# Patient Record
Sex: Male | Born: 1935 | Race: White | Hispanic: No | Marital: Married | Smoking: Never smoker
Health system: Southern US, Community
[De-identification: ages and names within clinical notes are randomized; demographics above are authoritative.]

## PROBLEM LIST (undated history)

## (undated) DIAGNOSIS — I480 Paroxysmal atrial fibrillation: Secondary | ICD-10-CM

## (undated) DIAGNOSIS — G4700 Insomnia, unspecified: Secondary | ICD-10-CM

## (undated) DIAGNOSIS — M542 Cervicalgia: Secondary | ICD-10-CM

## (undated) DIAGNOSIS — C61 Malignant neoplasm of prostate: Secondary | ICD-10-CM

## (undated) DIAGNOSIS — E559 Vitamin D deficiency, unspecified: Secondary | ICD-10-CM

## (undated) DIAGNOSIS — M545 Low back pain, unspecified: Secondary | ICD-10-CM

## (undated) DIAGNOSIS — N529 Male erectile dysfunction, unspecified: Secondary | ICD-10-CM

## (undated) DIAGNOSIS — C44711 Basal cell carcinoma of skin of unspecified lower limb, including hip: Secondary | ICD-10-CM

## (undated) DIAGNOSIS — I255 Ischemic cardiomyopathy: Secondary | ICD-10-CM

## (undated) DIAGNOSIS — I10 Essential (primary) hypertension: Secondary | ICD-10-CM

## (undated) DIAGNOSIS — I5022 Chronic systolic (congestive) heart failure: Secondary | ICD-10-CM

## (undated) DIAGNOSIS — C44721 Squamous cell carcinoma of skin of unspecified lower limb, including hip: Secondary | ICD-10-CM

## (undated) DIAGNOSIS — R413 Other amnesia: Secondary | ICD-10-CM

## (undated) DIAGNOSIS — E785 Hyperlipidemia, unspecified: Secondary | ICD-10-CM

## (undated) DIAGNOSIS — S12100A Unspecified displaced fracture of second cervical vertebra, initial encounter for closed fracture: Secondary | ICD-10-CM

## (undated) DIAGNOSIS — I2510 Atherosclerotic heart disease of native coronary artery without angina pectoris: Secondary | ICD-10-CM

## (undated) DIAGNOSIS — G459 Transient cerebral ischemic attack, unspecified: Secondary | ICD-10-CM

## (undated) DIAGNOSIS — I442 Atrioventricular block, complete: Secondary | ICD-10-CM

## (undated) HISTORY — DX: Cervicalgia: M54.2

## (undated) HISTORY — DX: Essential (primary) hypertension: I10

## (undated) HISTORY — PX: CARDIAC DEFIBRILLATOR PLACEMENT: SHX171

## (undated) HISTORY — DX: Malignant neoplasm of prostate: C61

## (undated) HISTORY — DX: Squamous cell carcinoma of skin of unspecified lower limb, including hip: C44.721

## (undated) HISTORY — DX: Male erectile dysfunction, unspecified: N52.9

## (undated) HISTORY — DX: Ischemic cardiomyopathy: I25.5

## (undated) HISTORY — DX: Paroxysmal atrial fibrillation: I48.0

## (undated) HISTORY — PX: CORONARY ARTERY BYPASS GRAFT: SHX141

## (undated) HISTORY — DX: Unspecified displaced fracture of second cervical vertebra, initial encounter for closed fracture: S12.100A

## (undated) HISTORY — PX: CORONARY ANGIOPLASTY: SHX604

## (undated) HISTORY — DX: Vitamin D deficiency, unspecified: E55.9

## (undated) HISTORY — DX: Low back pain: M54.5

## (undated) HISTORY — DX: Low back pain, unspecified: M54.50

## (undated) HISTORY — DX: Other amnesia: R41.3

## (undated) HISTORY — PX: KYPHOSIS SURGERY: SHX114

## (undated) HISTORY — PX: HERNIA REPAIR: SHX51

## (undated) HISTORY — DX: Transient cerebral ischemic attack, unspecified: G45.9

## (undated) HISTORY — DX: Hyperlipidemia, unspecified: E78.5

## (undated) HISTORY — DX: Basal cell carcinoma of skin of unspecified lower limb, including hip: C44.711

## (undated) HISTORY — DX: Atrioventricular block, complete: I44.2

## (undated) HISTORY — PX: BACK SURGERY: SHX140

## (undated) HISTORY — DX: Chronic systolic (congestive) heart failure: I50.22

## (undated) HISTORY — DX: Insomnia, unspecified: G47.00

---

## 2004-09-02 HISTORY — PX: COLONOSCOPY: SHX174

## 2007-09-03 LAB — HM COLONOSCOPY

## 2008-12-01 HISTORY — PX: OTHER SURGICAL HISTORY: SHX169

## 2008-12-01 LAB — HM DEXA SCAN

## 2009-05-10 ENCOUNTER — Encounter: Payer: Self-pay | Admitting: Internal Medicine

## 2009-05-25 ENCOUNTER — Ambulatory Visit: Payer: Self-pay | Admitting: Internal Medicine

## 2009-05-25 DIAGNOSIS — I442 Atrioventricular block, complete: Secondary | ICD-10-CM | POA: Insufficient documentation

## 2009-05-25 DIAGNOSIS — I255 Ischemic cardiomyopathy: Secondary | ICD-10-CM | POA: Insufficient documentation

## 2009-05-25 DIAGNOSIS — Z95810 Presence of automatic (implantable) cardiac defibrillator: Secondary | ICD-10-CM | POA: Insufficient documentation

## 2009-05-25 DIAGNOSIS — I5022 Chronic systolic (congestive) heart failure: Secondary | ICD-10-CM | POA: Insufficient documentation

## 2009-06-05 ENCOUNTER — Encounter: Payer: Self-pay | Admitting: Internal Medicine

## 2009-06-28 ENCOUNTER — Encounter: Payer: Self-pay | Admitting: Internal Medicine

## 2009-07-24 ENCOUNTER — Ambulatory Visit: Payer: Self-pay | Admitting: Internal Medicine

## 2009-07-25 ENCOUNTER — Telehealth: Payer: Self-pay | Admitting: Internal Medicine

## 2009-10-09 ENCOUNTER — Encounter: Payer: Self-pay | Admitting: Internal Medicine

## 2009-10-09 DIAGNOSIS — R159 Full incontinence of feces: Secondary | ICD-10-CM | POA: Insufficient documentation

## 2009-10-11 ENCOUNTER — Encounter: Payer: Self-pay | Admitting: Internal Medicine

## 2009-11-13 ENCOUNTER — Encounter: Payer: Self-pay | Admitting: Internal Medicine

## 2009-11-14 ENCOUNTER — Ambulatory Visit: Payer: Self-pay | Admitting: Internal Medicine

## 2009-11-23 ENCOUNTER — Telehealth: Payer: Self-pay | Admitting: Internal Medicine

## 2009-11-24 ENCOUNTER — Telehealth: Payer: Self-pay | Admitting: Internal Medicine

## 2010-01-10 ENCOUNTER — Ambulatory Visit: Payer: Self-pay | Admitting: Internal Medicine

## 2010-01-12 ENCOUNTER — Ambulatory Visit: Payer: Self-pay | Admitting: Internal Medicine

## 2010-01-12 ENCOUNTER — Inpatient Hospital Stay (HOSPITAL_COMMUNITY): Admission: EM | Admit: 2010-01-12 | Discharge: 2010-01-15 | Payer: Self-pay | Admitting: Emergency Medicine

## 2010-01-15 ENCOUNTER — Encounter (INDEPENDENT_AMBULATORY_CARE_PROVIDER_SITE_OTHER): Payer: Self-pay | Admitting: Internal Medicine

## 2010-01-16 ENCOUNTER — Telehealth: Payer: Self-pay | Admitting: Internal Medicine

## 2010-02-02 ENCOUNTER — Telehealth: Payer: Self-pay | Admitting: Internal Medicine

## 2010-03-08 ENCOUNTER — Encounter: Payer: Self-pay | Admitting: Internal Medicine

## 2010-03-08 ENCOUNTER — Ambulatory Visit: Payer: Self-pay

## 2010-04-09 ENCOUNTER — Encounter: Payer: Self-pay | Admitting: Internal Medicine

## 2010-05-14 ENCOUNTER — Ambulatory Visit: Payer: Self-pay

## 2010-05-14 ENCOUNTER — Encounter: Payer: Self-pay | Admitting: Internal Medicine

## 2010-07-04 ENCOUNTER — Encounter: Payer: Self-pay | Admitting: Internal Medicine

## 2010-07-04 ENCOUNTER — Ambulatory Visit: Payer: Self-pay

## 2010-09-05 ENCOUNTER — Ambulatory Visit: Admission: RE | Admit: 2010-09-05 | Discharge: 2010-09-05 | Payer: Self-pay | Source: Home / Self Care

## 2010-10-02 ENCOUNTER — Encounter: Payer: Self-pay | Admitting: Internal Medicine

## 2010-10-02 NOTE — Letter (Signed)
Summary: [facility] Progress Note  [facility] Progress Note   Imported By: Roderic Ovens 02/12/2010 12:54:49  _____________________________________________________________________  External Attachment:    Type:   Image     Comment:   External Document

## 2010-10-02 NOTE — Letter (Signed)
Summary: [facility] Care Office Note  [facility] Office [facility]   Imported By: Roderic Ovens 11/22/2009 15:16:22  _____________________________________________________________________  External Attachment:    Type:   Image     Comment:   External Document

## 2010-10-02 NOTE — Assessment & Plan Note (Signed)
Summary: [facility]   Visit Type:  Follow-up Primary Provider:   [name], MD   History of Present Illness: [name] is returns  today for followup of ICM, CHF, VT, and CHB.   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  He was well from a cardiac perspective until 2007 when he had syncope and subsequent evaluation revealed CAD and he underwent CABG.  Several months later he had recurrent syncope and underwent BiV ICD implant as he had LBBB.  He has subsequently developed CHB and his CHF class is 1-2.  He denies c/p, sob, or peripheral edema. He is now approaching ERI on his device.  It is unclear that he has clear cut sustained VT as he has been shocked only once and it sounds like this was due to atrial fib.  Current Medications (verified): 1)  Fosamax 70 Mg Tabs (Alendronate Sodium) .... Once A Week 2)  Lisinopril 20 Mg Tabs (Lisinopril) .... Take 1 Tablet By Mouth Two Times A Day 3)  Carvedilol 25 Mg Tabs (Carvedilol) .... Take One Tablet By Mouth Twice A Day 4)  Dyazide 37.5-25 Mg Caps (Triamterene-Hctz) .... Take 1 Capsule By Mouth Once A Day 5)  Simvastatin 20 Mg Tabs (Simvastatin) .... Take One Tablet By Mouth Daily At Bedtime 6)  Calcium Carbonate-Vitamin D 600-400 Mg-Unit  Tabs (Calcium Carbonate-Vitamin D) .... Take 1 Tablet By Mouth Two Times A Day 7)  Aspirin 81 Mg Tbec (Aspirin) .... Take One Tablet By Mouth Daily 8)  Lorazepam 1 Mg Tabs (Lorazepam) .... 1/ Tab As Needed 9)  Vitamin D 1000 Unit Tabs (Cholecalciferol) .... Take One Tablet By Mouth Once Daily.  Allergies: 1)  ! Norvasc 2)  ! Coumadin 3)  ! * Blood Pressure Patch  Past History:  Past Medical History: Last updated: 11/10/2009 Current Problems:  AV BLOCK, COMPLETE (ICD-426.0) CHRONIC SYSTOLIC HEART FAILURE (ICD-428.22) CARDIOMYOPATHY, ISCHEMIC (ICD-414.8) AUTOMATIC IMPLANTABLE CARDIAC DEFIBRILLATOR SITU (ICD-V45.02)    Review of Systems  The patient denies chest pain, syncope, dyspnea on exertion, and peripheral edema.    Vital Signs:  Patient profile:   77 year old male Height:      70 inches Weight:      155 pounds BMI:     22.32 Pulse rate:   79 / minute BP sitting:   140 / 80  (left arm)  Vitals Entered By: Laurance Flatten CMA (November 14, 2009 1:51 PM)  Physical Exam  General:  Well developed, well nourished, in no acute distress. Head:  normocephalic and atraumatic Eyes:  PERRLA/EOM intact; conjunctiva and lids normal. Mouth:  Teeth, gums and palate normal. Oral mucosa normal. Neck:  Neck supple, no JVD. No masses, thyromegaly or abnormal cervical nodes. Chest Wall:  Well healed ICD incision. Lungs:  Clear bilaterally with no wheezes, rales, or rhonchi.  No increased work of breathing. Heart:  RRR with normal S1 and split S2.  PMI is enlarged and laterally displaced. Abdomen:  Bowel sounds positive; abdomen soft and non-tender without masses, organomegaly, or hernias noted. No hepatosplenomegaly. Msk:  Back normal, normal gait. Muscle strength and tone normal. Pulses:  pulses normal in all 4 extremities Extremities:  No clubbing or cyanosis. Neurologic:  Alert and oriented x 3.    ICD Specifications Following MD:  Lewayne Bunting, MD     ICD Vendor:  [facility] [employer]     ICD Model Number:  V366     ICD Serial Number:  [id number] ICD DOI:  03/24/2006     ICD Implanting MD:  NOT IMPLANTED HERE  Lead 1:    Location: RA     DOI: 03/24/2006     Model #: 1888TC     Serial #: [id number]     Status: active Lead 2:    Location: RV     DOI: 05/19/2006     Model #: 6433     Serial #: [id number]     Status: active Lead 3:    Location: LV     DOI: 03/24/2006     Model #: 1058T     Serial #: [id number]     Status: active  Indications::  ICM, CHF   ICD Follow Up Remote Check?  No Battery Voltage:  2.52 V     Charge Time:  13.1 seconds     Battery Est. Longevity:  2 months Underlying rhythm:  dependent ICD Dependent:   Yes       ICD Device Measurements Atrium:  Amplitude: 1.2 mV, Impedance: 385 ohms, Threshold: 0.75 V at 0.4 msec Right Ventricle:  Impedance: 285 ohms, Threshold: 0.75 V at 0.4 msec Left Ventricle:  Impedance: 610 ohms, Threshold: 1.0 V at 0.8 msec  Episodes MS Episodes:  1     Percent Mode Switch:  <1%     Coumadin:  No Shock:  0     ATP:  0     Nonsustained:  0     Atrial Pacing:  94%     Ventricular Pacing:  99%  Brady Parameters Mode DDDR     Lower Rate Limit:  70     Upper Rate Limit 120 PAV 180     Sensed AV Delay:  110  Tachy Zones VF:  200     VT:  179     Tech Comments:  No parameter changes.  ROV 2 months with Dr. Ladona Ridgel. Altha Harm, LPN  November 14, 2009 2:33 PM  MD Comments:  He is approaching ERI.  Impression & Recommendations:  Problem # 1:  AUTOMATIC IMPLANTABLE CARDIAC DEFIBRILLATOR SITU (ICD-V45.02) His device is approaching ERI.  I have offered him either BiV ICD or BiV PM.  He is considering his options.  Problem # 2:  CHRONIC SYSTOLIC HEART FAILURE (ICD-428.22) He appears to be class 2.  Continue current meds and maintain a low sodium diet. His updated medication list for this problem includes:    Lisinopril 20 Mg Tabs (Lisinopril) .Marland Kitchen... Take 1 tablet by mouth two times a day    Carvedilol 25 Mg Tabs (Carvedilol) .Marland Kitchen... Take one tablet by mouth twice a day    Dyazide 37.5-25 Mg Caps (Triamterene-hctz) .Marland Kitchen... Take 1 capsule by mouth once a day    Aspirin 81 Mg Tbec (Aspirin) .Marland Kitchen... Take one tablet by mouth daily  Problem # 3:  CARDIOMYOPATHY, ISCHEMIC (ICD-414.8) He denies any anginal symptoms.  Continue meds as below. His updated medication list for this problem includes:    Lisinopril 20 Mg Tabs (Lisinopril) .Marland Kitchen... Take 1 tablet by mouth two times a day    Carvedilol 25 Mg Tabs (Carvedilol) .Marland Kitchen... Take one tablet by mouth twice a day    Dyazide 37.5-25 Mg Caps (Triamterene-hctz) .Marland Kitchen... Take 1 capsule by mouth once a day    Aspirin 81 Mg Tbec (Aspirin) .Marland Kitchen... Take  one tablet by mouth daily Prescriptions: SIMVASTATIN 20 MG TABS (SIMVASTATIN) Take one tablet by mouth daily at bedtime  #  90 x 3   Entered by:   Layne Benton, RN, BSN   Authorized by:   Laren Boom, MD, [facility]   Signed by:   Layne Benton, RN, BSN on 11/14/2009   Method used:   Faxed to ...       [facility] Environmental education officer)       [address] [address]       [place], [place]  [address]       Ph: [phone]       Fax: [phone]   RxID:   [id number] DYAZIDE 37.5-25 MG CAPS (TRIAMTERENE-HCTZ) Take 1 capsule by mouth once a day  #90 x 3   Entered by:   Layne Benton, RN, BSN   Authorized by:   Laren Boom, MD, [facility]   Signed by:   Layne Benton, RN, BSN on 11/14/2009   Method used:   Faxed to ...       [facility] Environmental education officer)       [address] [address]       [place], [place]  [address]       Ph: [phone]       Fax: [phone]   RxID:   [id number] CARVEDILOL 25 MG TABS (CARVEDILOL) Take one tablet by mouth twice a day  #180 x 3   Entered by:   Layne Benton, RN, BSN   Authorized by:   Laren Boom, MD, [facility]   Signed by:   Layne Benton, RN, BSN on 11/14/2009   Method used:   Faxed to ...       [facility] Environmental education officer)       [address] [address]       [place], [place]  [address]       Ph: [phone]       Fax: [phone]   RxID:   [id number]

## 2010-10-02 NOTE — Cardiovascular Report (Signed)
Summary: Office Visit   Office Visit   Imported By: Roderic Ovens 05/29/2010 15:21:28  _____________________________________________________________________  External Attachment:    Type:   Image     Comment:   External Document

## 2010-10-02 NOTE — Procedures (Signed)
Summary: device check   Current Medications (verified): 1)  Lisinopril 20 Mg Tabs (Lisinopril) .... One By Mouth Daily 2)  Carvedilol 25 Mg Tabs (Carvedilol) .... Take One Tablet By Mouth Twice A Day 3)  Maxzide-25 37.5-25 Mg Tabs (Triamterene-Hctz) .Marland Kitchen.. 1 Tablet By Mouth Once Daily . 4)  Simvastatin 20 Mg Tabs (Simvastatin) .... Take One Tablet By Mouth Daily At Bedtime 5)  Calcium Carbonate-Vitamin D 600-400 Mg-Unit  Tabs (Calcium Carbonate-Vitamin D) .... Take 1 Tablet By Mouth Two Times A Day 6)  Lorazepam 1 Mg Tabs (Lorazepam) .... 1/ Tab As Needed 7)  Vitamin D 1000 Unit Tabs (Cholecalciferol) .... Take One Tablet By Mouth Once Daily. 8)  Aggrenox 25-200 Mg Xr12h-Cap (Aspirin-Dipyridamole) .... One By Mouth Two Times A Day  Allergies (verified): 1)  ! Norvasc 2)  ! Coumadin 3)  ! * Blood Pressure Patch   ICD Specifications Following MD:  Lewayne Bunting, MD     ICD Vendor:  [employer]     ICD Model Number:  [phone]     ICD Serial Number:  [id number] ICD DOI:  03/24/2006     ICD Implanting MD:  NOT IMPLANTED HERE  Lead 1:    Location: RA     DOI: 03/24/2006     Model #: 1888TC     Serial #: [id number]     Status: active Lead 2:    Location: RV     DOI: 05/19/2006     Model #: 7829     Serial #: [id number]     Status: active Lead 3:    Location: LV     DOI: 03/24/2006     Model #: 1058T     Serial #: [id number]     Status: active  Indications::  ICM, CHF   ICD Follow Up Battery Voltage:  2.52 V     Charge Time:  13.1 seconds     Battery Est. Longevity:  2-3 mths Underlying rhythm:  SR ICD Dependent:  Yes       ICD Device Measurements Atrium:  Amplitude: 1.0 mV, Impedance: 370 ohms, Threshold: 0.75 V at 0.4 msec Right Ventricle:  Amplitude: 9.0 mV, Impedance: 270 ohms, Threshold: 0.75 V at 0.4 msec Left Ventricle:  Impedance: 590 ohms, Threshold: 1.0 V at 0.8 msec Shock Impedance: 38 ohms   Episodes MS Episodes:  10     Percent Mode Switch:  <1%     Coumadin:  No Shock:  0     ATP:  0      Nonsustained:  0     Atrial Therapies:  0 Atrial Pacing:  96%     Ventricular Pacing:  98%  Brady Parameters Mode DDDR     Lower Rate Limit:  70     Upper Rate Limit 120 PAV 180     Sensed AV Delay:  110  Tachy Zones VF:  200     VT:  179     Next Cardiology Appt Due:  07/03/2010 Tech Comments:  BATTERY VOLTAGE 2.52--PER JOEY AT [facility] 2-3 MTHS W/100% PACING.  NORMAL DEVICE FUNCTION.  CHANGED RV OUTPUT FROM 2.0 TO 2.5 V. DEMONSTRATED VIBRATION FEELING FOR PT AND PT AWARE TO CALL IF HE FEELS IT.   ROV IN 2 MTHS W/DEVICE CLINIC. Vella Kohler  May 15, 2010 9:37 AM

## 2010-10-02 NOTE — Cardiovascular Report (Signed)
Summary: Office Visit   Office Visit   Imported By: Roderic Ovens 01/23/2010 16:06:14  _____________________________________________________________________  External Attachment:    Type:   Image     Comment:   External Document

## 2010-10-02 NOTE — Progress Notes (Signed)
Summary: b/p reading today 114/80  Phone Note Call from Patient Call back at Home Phone [phone]   Caller: Patient Reason for Call: Talk to Nurse Summary of Call: pt in ed on friday b/p 160/120 taken at [facility]. spend 3 night at hospital . this am 114/80. no cp, sob.  Initial call taken by: Lorne Skeens,  Jan 16, 2010 11:22 AM  Follow-up for Phone Call        that BP is fine.  BP 114/80.  Will go to doctors on Jhonnie Garner at [facility] or to call Dr Genevie Ann who is his primary care to follow up with his BP and the change in medications.  Pt understands Dennis Bast, RN, BSN  Jan 16, 2010 1:18 PM

## 2010-10-02 NOTE — Progress Notes (Signed)
Summary: refill meds  Phone Note Refill Request Call back at Home Phone [phone] Message from:  Patient on November 23, 2009 11:06 AM  Refills Requested: Medication #1:  DYAZIDE 37.5-25 MG CAPS Take 1 capsule by mouth once a day [facility] [phone] member id # [phone]   Method Requested: Fax to Mail Away Pharmacy Initial call taken by: Lorne Skeens,  November 23, 2009 11:06 AM    New/Updated Medications: DYAZIDE 37.5-25 MG CAPS (TRIAMTERENE-HCTZ) Take 1 capsule by mouth once a day Prescriptions: DYAZIDE 37.5-25 MG CAPS (TRIAMTERENE-HCTZ) Take 1 capsule by mouth once a day  #90 x 3   Entered by:   Laurance Flatten CMA   Authorized by:   Laren Boom, MD, [facility]   Signed by:   Laurance Flatten CMA on 11/23/2009   Method used:   Faxed to ...       [facility] Environmental education officer)       [address] [address]       [place], [place]  [address]       Ph: [phone]       Fax: [phone]   RxID:   [id number]

## 2010-10-02 NOTE — Cardiovascular Report (Signed)
Summary: Office Visit   Office Visit   Imported By: Roderic Ovens 03/13/2010 10:03:31  _____________________________________________________________________  External Attachment:    Type:   Image     Comment:   External Document

## 2010-10-02 NOTE — Letter (Signed)
Summary: [facility] Progress Note   [facility] Progress Note   Imported By: Roderic Ovens 05/22/2010 15:43:07  _____________________________________________________________________  External Attachment:    Type:   Image     Comment:   External Document

## 2010-10-02 NOTE — Letter (Signed)
Summary: Medical laboratory scientific officer Comprehensive Medical Exam  [facility] Comprehensive Medical Exam   Imported By: Roderic Ovens 02/12/2010 12:55:48  _____________________________________________________________________  External Attachment:    Type:   Image     Comment:   External Document

## 2010-10-02 NOTE — Assessment & Plan Note (Signed)
Summary: pc2   Visit Type:  Follow-up Primary Provider:   [name], MD   History of Present Illness: Jacob Rosales is returns  today for followup of ICM, CHF, VT, and CHB.   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  He was well from a cardiac perspective until 2007 when he had syncope and subsequent evaluation revealed CAD and he underwent CABG.  Several months later he had recurrent syncope and underwent BiV ICD implant as he had LBBB.  He has subsequently developed CHB and his CHF class is 1-2.  He denies c/p, sob, or peripheral edema. He is now approaching ERI on his device. Since I saw him last he has been stable with no intercurrent ICD shocks.  No other complaints today.  Current Medications (verified): 1)  Fosamax 70 Mg Tabs (Alendronate Sodium) .... Once A Week 2)  Lisinopril 20 Mg Tabs (Lisinopril) .... Take 1 Tablet By Mouth Two Times A Day 3)  Carvedilol 25 Mg Tabs (Carvedilol) .... Take One Tablet By Mouth Twice A Day 4)  Maxzide-25 37.5-25 Mg Tabs (Triamterene-Hctz) .Marland Kitchen.. 1 Tablet By Mouth Once Daily . 5)  Simvastatin 20 Mg Tabs (Simvastatin) .... Take One Tablet By Mouth Daily At Bedtime 6)  Calcium Carbonate-Vitamin D 600-400 Mg-Unit  Tabs (Calcium Carbonate-Vitamin D) .... Take 1 Tablet By Mouth Two Times A Day 7)  Aspirin 81 Mg Tbec (Aspirin) .... Take One Tablet By Mouth Daily 8)  Lorazepam 1 Mg Tabs (Lorazepam) .... 1/ Tab As Needed 9)  Vitamin D 1000 Unit Tabs (Cholecalciferol) .... Take One Tablet By Mouth Once Daily.  Allergies (verified): 1)  ! Norvasc 2)  ! Coumadin 3)  ! * Blood Pressure Patch  Past History:  Past Medical History: Last updated: 11/10/2009 Current Problems:  AV BLOCK, COMPLETE (ICD-426.0) CHRONIC SYSTOLIC HEART FAILURE (ICD-428.22) CARDIOMYOPATHY, ISCHEMIC (ICD-414.8) AUTOMATIC IMPLANTABLE CARDIAC DEFIBRILLATOR SITU (ICD-V45.02)    Review of Systems  The  patient denies chest pain, syncope, dyspnea on exertion, and peripheral edema.    Vital Signs:  Patient profile:   77 year old male Height:      70 inches Weight:      153 pounds BMI:     22.03 Pulse rate:   85 / minute BP sitting:   118 / 82  (left arm)  Vitals Entered By: Laurance Flatten CMA (Jan 10, 2010 2:16 PM)  Physical Exam  General:  Well developed, well nourished, in no acute distress. Head:  normocephalic and atraumatic Eyes:  PERRLA/EOM intact; conjunctiva and lids normal. Mouth:  Teeth, gums and palate normal. Oral mucosa normal. Neck:  Neck supple, no JVD. No masses, thyromegaly or abnormal cervical nodes. Chest Wall:  Well healed ICD incision. Lungs:  Clear bilaterally with no wheezes, rales, or rhonchi.  No increased work of breathing. Heart:  RRR with normal S1 and split S2.  PMI is enlarged and laterally displaced. Abdomen:  Bowel sounds positive; abdomen soft and non-tender without masses, organomegaly, or hernias noted. No hepatosplenomegaly. Msk:  Back normal, normal gait. Muscle strength and tone normal. Pulses:  pulses normal in all 4 extremities Extremities:  No clubbing or cyanosis. Neurologic:  Alert and oriented x 3.    ICD Specifications Following MD:  Lewayne Bunting, MD     ICD Vendor:  [facility] [employer]     ICD Model Number:  [phone]     ICD Serial Number:  [id number] ICD DOI:  03/24/2006     ICD Implanting MD:  NOT IMPLANTED HERE  Lead 1:    Location: RA     DOI: 03/24/2006     Model #: 1888TC     Serial #: [id number]     Status: active Lead 2:    Location: RV     DOI: 05/19/2006     Model #: 4166     Serial #: [id number]     Status: active Lead 3:    Location: LV     DOI: 03/24/2006     Model #: 1058T     Serial #: [id number]     Status: active  Indications::  ICM, CHF   ICD Follow Up Remote Check?  No Battery Voltage:  2.52 V     ICD Dependent:  Yes      Episodes MS Episodes:  10     Percent Mode Switch:  <1%     Coumadin:  No Shock:  0     ATP:  0     Nonsustained:   0      Brady Parameters Mode DDDR     Lower Rate Limit:  70     Upper Rate Limit 120 PAV 180     Sensed AV Delay:  110  Tachy Zones VF:  200     VT:  179     Tech Comments:  Battery check only for Atlas reaching 2.55 05/25/2009.  ROV 2 months  Altha Harm, LPN  Jan 10, 2010 2:32 PM  MD Comments:  Agree with above.  Impression & Recommendations:  Problem # 1:  AUTOMATIC IMPLANTABLE CARDIAC DEFIBRILLATOR SITU (ICD-V45.02) His device is approaching ERI.  Will recheck in a couple of months.  Problem # 2:  CARDIOMYOPATHY, ISCHEMIC (ICD-414.8) He is free of anginal symptoms.  Will followup. Continue current meds. His updated medication list for this problem includes:    Lisinopril 20 Mg Tabs (Lisinopril) .Marland Kitchen... Take 1 tablet by mouth two times a day    Carvedilol 25 Mg Tabs (Carvedilol) .Marland Kitchen... Take one tablet by mouth twice a day    Maxzide-25 37.5-25 Mg Tabs (Triamterene-hctz) .Marland Kitchen... 1 tablet by mouth once daily .    Aspirin 81 Mg Tbec (Aspirin) .Marland Kitchen... Take one tablet by mouth daily  Problem # 3:  CHRONIC SYSTOLIC HEART FAILURE (ICD-428.22) His symptoms are well controlled class 2.  A low sodium diet and medical therapy are recommended. His updated medication list for this problem includes:    Lisinopril 20 Mg Tabs (Lisinopril) .Marland Kitchen... Take 1 tablet by mouth two times a day    Carvedilol 25 Mg Tabs (Carvedilol) .Marland Kitchen... Take one tablet by mouth twice a day    Maxzide-25 37.5-25 Mg Tabs (Triamterene-hctz) .Marland Kitchen... 1 tablet by mouth once daily .    Aspirin 81 Mg Tbec (Aspirin) .Marland Kitchen... Take one tablet by mouth daily  Patient Instructions: 1)  Your physician recommends that you schedule a follow-up appointment in: 2 months with device clinic

## 2010-10-02 NOTE — Procedures (Signed)
Summary: DEVICE   Current Medications (verified): 1)  Lisinopril 20 Mg Tabs (Lisinopril) .... One By Mouth Daily 2)  Carvedilol 25 Mg Tabs (Carvedilol) .... Take One Tablet By Mouth Twice A Day 3)  Maxzide-25 37.5-25 Mg Tabs (Triamterene-Hctz) .Marland Kitchen.. 1 Tablet By Mouth Once Daily . 4)  Simvastatin 20 Mg Tabs (Simvastatin) .... Take One Tablet By Mouth Daily At Bedtime 5)  Calcium Carbonate-Vitamin D 600-400 Mg-Unit  Tabs (Calcium Carbonate-Vitamin D) .... Take 1 Tablet By Mouth Two Times A Day 6)  Lorazepam 1 Mg Tabs (Lorazepam) .... 1/ Tab As Needed 7)  Vitamin D 1000 Unit Tabs (Cholecalciferol) .... Take One Tablet By Mouth Once Daily. 8)  Aggrenox 25-200 Mg Xr12h-Cap (Aspirin-Dipyridamole) .... One By Mouth Two Times A Day  Allergies (verified): 1)  ! Norvasc 2)  ! Coumadin 3)  ! * Blood Pressure Patch   ICD Specifications Following MD:  Lewayne Bunting, MD     ICD Vendor:  [employer]     ICD Model Number:  [phone]     ICD Serial Number:  [id number] ICD DOI:  03/24/2006     ICD Implanting MD:  NOT IMPLANTED HERE  Lead 1:    Location: RA     DOI: 03/24/2006     Model #: 1888TC     Serial #: [id number]     Status: active Lead 2:    Location: RV     DOI: 05/19/2006     Model #: 8469     Serial #: [id number]     Status: active Lead 3:    Location: LV     DOI: 03/24/2006     Model #: 1058T     Serial #: [id number]     Status: active  Indications::  ICM, CHF   ICD Follow Up Battery Voltage:  2.51 V     Charge Time:  13.5 seconds     ICD Dependent:  Yes      Episodes Coumadin:  No  Brady Parameters Mode DDDR     Lower Rate Limit:  70     Upper Rate Limit 120 PAV 180     Sensed AV Delay:  110  Tachy Zones VF:  200     VT:  179     Next Cardiology Appt Due:  09/03/2010 Tech Comments:  INTERROGATION ONLY---BATTERY CHECK-- BATTERY VOLTAGE 2.51 (LAST CHECK 2.52 V AND PER [facility] 2-3 MTHS W/100% PACING). ROV IN 2 MTHS FOR BATTERY CHECK. Vella Kohler  July 04, 2010 4:47 PM

## 2010-10-02 NOTE — Progress Notes (Signed)
Summary: dyazide on back order - subt for maxdide 37.5/25  Phone Note From Pharmacy   Refills Requested: Medication #1:  DYAZIDE 37.5-25 MG CAPS Take 1 capsule by mouth once a day Caller: [facility]-[phone] / ref # [id number] Request: Speak with Nurse Summary of Call: THIS MED IS ON BACK ORDER. can we get permission to dispense maxdide 37.5 / 25 tab.  Initial call taken by: Lorne Skeens,  November 24, 2009 10:22 AM    New/Updated Medications: MAXZIDE-25 37.5-25 MG TABS (TRIAMTERENE-HCTZ) 1 tablet by mouth once daily . Prescriptions: MAXZIDE-25 37.5-25 MG TABS (TRIAMTERENE-HCTZ) 1 tablet by mouth once daily .  #90 x 3   Entered by:   Dennis Bast, RN, BSN   Authorized by:   Laren Boom, MD, [facility]   Signed by:   Dennis Bast, RN, BSN on 11/24/2009   Method used:   Faxed to ...       [facility] Environmental education officer)       [address] [address]       [place], [place]  [address]       Ph: [phone]       Fax: [phone]   RxID:   [id number]

## 2010-10-02 NOTE — Procedures (Signed)
Summary: DEVICE CK   Current Medications (verified): 1)  Lisinopril 20 Mg Tabs (Lisinopril) .... One By Mouth Daily 2)  Carvedilol 25 Mg Tabs (Carvedilol) .... Take One Tablet By Mouth Twice A Day 3)  Maxzide-25 37.5-25 Mg Tabs (Triamterene-Hctz) .Marland Kitchen.. 1 Tablet By Mouth Once Daily . 4)  Simvastatin 20 Mg Tabs (Simvastatin) .... Take One Tablet By Mouth Daily At Bedtime 5)  Calcium Carbonate-Vitamin D 600-400 Mg-Unit  Tabs (Calcium Carbonate-Vitamin D) .... Take 1 Tablet By Mouth Two Times A Day 6)  Lorazepam 1 Mg Tabs (Lorazepam) .... 1/ Tab As Needed 7)  Vitamin D 1000 Unit Tabs (Cholecalciferol) .... Take One Tablet By Mouth Once Daily. 8)  Aggrenox 25-200 Mg Xr12h-Cap (Aspirin-Dipyridamole) .... One By Mouth Two Times A Day  Allergies (verified): 1)  ! Norvasc 2)  ! Coumadin 3)  ! * Blood Pressure Patch   ICD Specifications Following MD:  Lewayne Bunting, MD     ICD Vendor:  St Jude     ICD Model Number:  [phone]     ICD Serial Number:  [id number] ICD DOI:  03/24/2006     ICD Implanting MD:  NOT IMPLANTED HERE  Lead 1:    Location: RA     DOI: 03/24/2006     Model #: 1888TC     Serial #: [id number]     Status: active Lead 2:    Location: RV     DOI: 05/19/2006     Model #: 7829     Serial #: [id number]     Status: active Lead 3:    Location: LV     DOI: 03/24/2006     Model #: 1058T     Serial #: [id number]     Status: active  Indications::  ICM, CHF   ICD Follow Up Remote Check?  No Battery Voltage:  2.55 V     Charge Time:  13.1 seconds     Underlying rhythm:  dependent ICD Dependent:  Yes       ICD Device Measurements Atrium:  Impedance: 365 ohms,  Right Ventricle:  Impedance: 280 ohms,  Left Ventricle:  Impedance: 590 ohms,   Episodes MS Episodes:  0     Percent Mode Switch:  0     Coumadin:  No Shock:  0     ATP:  0     Nonsustained:  0     Atrial Pacing:  90%     Ventricular Pacing:  98%  Brady Parameters Mode DDDR     Lower Rate Limit:  70     Upper Rate Limit 120 PAV 180      Sensed AV Delay:  110  Tachy Zones VF:  200     VT:  179     Tech Comments:  No parameter changes.  Interrogation only for battery check of Atlas [id number].  2.55 since 05/25/09.  Mr. Valone is pacemaker dependent.  ROV 2 months in the clinic. Altha Harm, LPN  March 09, 9527 3:55 PM

## 2010-10-02 NOTE — Progress Notes (Signed)
Summary: f/u on blood pressure meds  Phone Note Call from Patient Call back at Home Phone [phone] Call back at or call back after 3 p.m.   Caller: Patient Reason for Call: Talk to Nurse Summary of Call:  f/u on blood pressure meds that was changed.  Initial call taken by: Lorne Skeens,  February 02, 2010 11:16 AM  Follow-up for Phone Call        Met with NP and the change in med are as follows d/c Lisinopril 20mg pm dose only d/c'd the am dose and his BP was 109/65 pm 133/78. Dennis Bast, RN, BSN  February 02, 2010 11:28 AM

## 2010-10-02 NOTE — Progress Notes (Signed)
Summary: Patients At Home Vitals  Patients At Home Vitals   Imported By: Roderic Ovens 12/05/2009 16:19:31  _____________________________________________________________________  External Attachment:    Type:   Image     Comment:   External Document

## 2010-10-04 NOTE — Cardiovascular Report (Signed)
Summary: Office Visit   Office Visit   Imported By: Roderic Ovens 08/16/2010 11:58:19  _____________________________________________________________________  External Attachment:    Type:   Image     Comment:   External Document

## 2010-10-04 NOTE — Procedures (Signed)
Summary: DEVICE CHECK   Current Medications (verified): 1)  Lisinopril 20 Mg Tabs (Lisinopril) .... One By Mouth Daily 2)  Carvedilol 25 Mg Tabs (Carvedilol) .... Take One Tablet By Mouth Twice A Day 3)  Maxzide-25 37.5-25 Mg Tabs (Triamterene-Hctz) .Marland Kitchen.. 1 Tablet By Mouth Once Daily . 4)  Simvastatin 20 Mg Tabs (Simvastatin) .... Take One Tablet By Mouth Daily At Bedtime 5)  Calcium Carbonate-Vitamin D 600-400 Mg-Unit  Tabs (Calcium Carbonate-Vitamin D) .... Take 1 Tablet By Mouth Two Times A Day 6)  Lorazepam 1 Mg Tabs (Lorazepam) .... 1/ Tab As Needed 7)  Vitamin D 1000 Unit Tabs (Cholecalciferol) .... Take One Tablet By Mouth Once Daily. 8)  Aggrenox 25-200 Mg Xr12h-Cap (Aspirin-Dipyridamole) .... One By Mouth Two Times A Day  Allergies (verified): 1)  ! Norvasc 2)  ! Coumadin 3)  ! * Blood Pressure Patch   ICD Specifications Following MD:  Lewayne Bunting, MD     ICD Vendor:  St Jude     ICD Model Number:  [phone]     ICD Serial Number:  [id number] ICD DOI:  03/24/2006     ICD Implanting MD:  NOT IMPLANTED HERE  Lead 1:    Location: RA     DOI: 03/24/2006     Model #: 1888TC     Serial #: [id number]     Status: active Lead 2:    Location: RV     DOI: 05/19/2006     Model #: 6213     Serial #: [id number]     Status: active Lead 3:    Location: LV     DOI: 03/24/2006     Model #: 1058T     Serial #: [id number]     Status: active  Indications::  ICM, CHF   ICD Follow Up Battery Voltage:  2.47 V     Charge Time:  13.8 seconds     ICD Dependent:  Yes       ICD Device Measurements Atrium:  Impedance: 360 ohms,  Right Ventricle:  Impedance: 270 ohms,  Left Ventricle:  Impedance: 600 ohms,  Shock Impedance: 23 ohms   Episodes Coumadin:  No  Brady Parameters Mode DDDR     Lower Rate Limit:  70     Upper Rate Limit 120 PAV 180     Sensed AV Delay:  110  Tachy Zones VF:  200     VT:  179     Next Cardiology Appt Due:  10/03/2010 Tech Comments:  INTERROGATION ONLY---BATTERY VOLTAGE 2.47 V.   PER CHRISTOPHER AT [facility] LESS THAN 3 MTHS.  ROV IN 1 MTH.  PT AWARE OF VIBRATION NOTIFIER AND TO CALL IF FEELS IT. Vella Kohler  September 05, 2010 5:10 PM

## 2010-10-08 ENCOUNTER — Encounter: Payer: Self-pay | Admitting: Internal Medicine

## 2010-10-09 ENCOUNTER — Encounter: Payer: Self-pay | Admitting: Internal Medicine

## 2010-10-09 ENCOUNTER — Encounter (INDEPENDENT_AMBULATORY_CARE_PROVIDER_SITE_OTHER): Payer: Medicare Other

## 2010-10-09 DIAGNOSIS — I2589 Other forms of chronic ischemic heart disease: Secondary | ICD-10-CM

## 2010-10-24 NOTE — Procedures (Signed)
Summary: device check/sl mca   Current Medications (verified): 1)  Lisinopril 20 Mg Tabs (Lisinopril) .... One By Mouth Daily 2)  Carvedilol 25 Mg Tabs (Carvedilol) .... Take One Tablet By Mouth Twice A Day 3)  Maxzide-25 37.5-25 Mg Tabs (Triamterene-Hctz) .Marland Kitchen.. 1 Tablet By Mouth Once Daily . 4)  Simvastatin 20 Mg Tabs (Simvastatin) .... Take One Tablet By Mouth Daily At Bedtime 5)  Calcium Carbonate-Vitamin D 600-400 Mg-Unit  Tabs (Calcium Carbonate-Vitamin D) .... Take 1 Tablet By Mouth Two Times A Day 6)  Lorazepam 1 Mg Tabs (Lorazepam) .... 1/ Tab As Needed 7)  Vitamin D 1000 Unit Tabs (Cholecalciferol) .... Take One Tablet By Mouth Once Daily. 8)  Aggrenox 25-200 Mg Xr12h-Cap (Aspirin-Dipyridamole) .... One By Mouth Two Times A Day  Allergies (verified): 1)  ! Norvasc 2)  ! Coumadin 3)  ! * Blood Pressure Patch   ICD Specifications Following MD:  Lewayne Bunting, MD     ICD Vendor:  St Jude     ICD Model Number:  [phone]     ICD Serial Number:  [id number] ICD DOI:  03/24/2006     ICD Implanting MD:  NOT IMPLANTED HERE  Lead 1:    Location: RA     DOI: 03/24/2006     Model #: 1888TC     Serial #: [id number]     Status: active Lead 2:    Location: RV     DOI: 05/19/2006     Model #: 2130     Serial #: [id number]     Status: active Lead 3:    Location: LV     DOI: 03/24/2006     Model #: 1058T     Serial #: [id number]     Status: active  Indications::  ICM, CHF   ICD Follow Up Battery Voltage:  2.47 V     Charge Time:  13.8 seconds     Underlying rhythm:  CHB ICD Dependent:  Yes       ICD Device Measurements Atrium:  Amplitude: 3.0 mV, Impedance: 365 ohms, Threshold: 0.75 V at 0.4 msec Right Ventricle:  Amplitude: PACED mV, Impedance: 270 ohms, Threshold: 0.75 V at 0.4 msec Left Ventricle:  Impedance: 600 ohms, Threshold: 1.0 V at 0.8 msec Shock Impedance: 23 ohms   Episodes MS Episodes:  0     Percent Mode Switch:  0     Coumadin:  No Shock:  0     ATP:  0     Nonsustained:  0     Atrial  Therapies:  0 Atrial Pacing:  91%     Ventricular Pacing:  >99%  Brady Parameters Mode DDDR     Lower Rate Limit:  70     Upper Rate Limit 120 PAV 180     Sensed AV Delay:  110  Tachy Zones VF:  200     VT:  179     Next Cardiology Appt Due:  11/08/2010 Tech Comments:  BATTERY VOLTAGE 2.47 V---ROV IN 1 MTH FOR DEVICE CHECK.  NORMAL DEVICE FUNCTION.  NO CHANGES MADE. ROV IN 1 MTH. Vella Kohler  October 10, 2010 8:39 AM

## 2010-10-30 ENCOUNTER — Encounter (INDEPENDENT_AMBULATORY_CARE_PROVIDER_SITE_OTHER): Payer: Self-pay | Admitting: *Deleted

## 2010-11-08 ENCOUNTER — Telehealth: Payer: Self-pay | Admitting: Internal Medicine

## 2010-11-08 NOTE — Letter (Signed)
Summary: Appointment - Reschedule  [facility], Main Office  [address]   [place], [place] [address]   Phone: [phone]  Fax: [phone]     October 30, 2010 MRN: [id number]   ALIOUNE HODGKIN [address] [place], [place]  [address]   Dear Mr. APACHITO,   Due to a change in our office schedule, your appointment on  11-07-10  at   3:30p             must be changed.  It is very important that we reach you to reschedule this appointment. We look forward to participating in your health care needs. Please contact us at the number listed above at your earliest convenience to reschedule this appointment.     Sincerely,  Glass blower/designer

## 2010-11-08 NOTE — Cardiovascular Report (Signed)
Summary: Office Visit   Office Visit   Imported By: Roderic Ovens 10/29/2010 15:53:42  _____________________________________________________________________  External Attachment:    Type:   Image     Comment:   External Document

## 2010-11-13 NOTE — Letter (Signed)
Summary: [facility]   Imported By: Marylou Mccoy 11/06/2010 15:48:19  _____________________________________________________________________  External Attachment:    Type:   Image     Comment:   External Document

## 2010-11-15 ENCOUNTER — Encounter: Payer: Self-pay | Admitting: Internal Medicine

## 2010-11-15 ENCOUNTER — Encounter: Payer: Self-pay | Admitting: Physician Assistant

## 2010-11-15 ENCOUNTER — Ambulatory Visit (INDEPENDENT_AMBULATORY_CARE_PROVIDER_SITE_OTHER): Payer: Medicare Other | Admitting: Physician Assistant

## 2010-11-15 DIAGNOSIS — E785 Hyperlipidemia, unspecified: Secondary | ICD-10-CM | POA: Insufficient documentation

## 2010-11-15 DIAGNOSIS — I5022 Chronic systolic (congestive) heart failure: Secondary | ICD-10-CM

## 2010-11-15 DIAGNOSIS — I2510 Atherosclerotic heart disease of native coronary artery without angina pectoris: Secondary | ICD-10-CM | POA: Insufficient documentation

## 2010-11-15 DIAGNOSIS — I2589 Other forms of chronic ischemic heart disease: Secondary | ICD-10-CM

## 2010-11-15 DIAGNOSIS — I428 Other cardiomyopathies: Secondary | ICD-10-CM

## 2010-11-15 DIAGNOSIS — I10 Essential (primary) hypertension: Secondary | ICD-10-CM | POA: Insufficient documentation

## 2010-11-16 ENCOUNTER — Encounter: Payer: Self-pay | Admitting: Internal Medicine

## 2010-11-16 ENCOUNTER — Other Ambulatory Visit: Payer: Self-pay | Admitting: Internal Medicine

## 2010-11-16 ENCOUNTER — Other Ambulatory Visit (INDEPENDENT_AMBULATORY_CARE_PROVIDER_SITE_OTHER): Payer: Medicare Other

## 2010-11-16 DIAGNOSIS — E785 Hyperlipidemia, unspecified: Secondary | ICD-10-CM

## 2010-11-16 DIAGNOSIS — I509 Heart failure, unspecified: Secondary | ICD-10-CM

## 2010-11-16 DIAGNOSIS — I2510 Atherosclerotic heart disease of native coronary artery without angina pectoris: Secondary | ICD-10-CM

## 2010-11-16 DIAGNOSIS — I10 Essential (primary) hypertension: Secondary | ICD-10-CM

## 2010-11-16 DIAGNOSIS — I5022 Chronic systolic (congestive) heart failure: Secondary | ICD-10-CM

## 2010-11-16 LAB — CBC WITH DIFFERENTIAL/PLATELET
HCT: 40.2 % (ref 39.0–52.0)
Hemoglobin: 13.7 g/dL (ref 13.0–17.0)
MCHC: 34.1 g/dL (ref 30.0–36.0)
MCV: 97.3 fl (ref 78.0–100.0)
RBC: 4.14 Mil/uL — ABNORMAL LOW (ref 4.22–5.81)
RDW: 14 % (ref 11.5–14.6)
WBC: 4.4 10*3/uL — ABNORMAL LOW (ref 4.5–10.5)

## 2010-11-16 LAB — BASIC METABOLIC PANEL
BUN: 30 mg/dL — ABNORMAL HIGH (ref 6–23)
Chloride: 101 mEq/L (ref 96–112)
Creatinine, Ser: 1.5 mg/dL (ref 0.4–1.5)
Potassium: 4.4 mEq/L (ref 3.5–5.1)
Sodium: 138 mEq/L (ref 135–145)

## 2010-11-16 LAB — PROTIME-INR: INR: 1 ratio (ref 0.8–1.0)

## 2010-11-19 ENCOUNTER — Telehealth: Payer: Self-pay | Admitting: Internal Medicine

## 2010-11-19 LAB — BASIC METABOLIC PANEL
BUN: 14 mg/dL (ref 6–23)
CO2: 27 mEq/L (ref 19–32)
Calcium: 9.3 mg/dL (ref 8.4–10.5)
Chloride: 102 mEq/L (ref 96–112)
Creatinine, Ser: 1.09 mg/dL (ref 0.4–1.5)
GFR calc Af Amer: >60 mL/min (ref >60)
GFR calc non Af Amer: >60 mL/min (ref >60)
Glucose, Bld: 92 mg/dL (ref 70–99)
Potassium: 3.5 mEq/L (ref 3.5–5.1)
Sodium: 138 mEq/L (ref 135–145)

## 2010-11-19 LAB — CBC
HCT: 40.2 % (ref 39.0–52.0)
Platelets: 106 10*3/uL — ABNORMAL LOW (ref 150–400)
WBC: 4.8 10*3/uL (ref 4.0–10.5)

## 2010-11-20 LAB — CK TOTAL AND CKMB (NOT AT ARMC): Relative Index: 3.3 — ABNORMAL HIGH (ref 0.0–2.5)

## 2010-11-20 LAB — CBC
HCT: 41.4 % (ref 39.0–52.0)
MCHC: 34.7 g/dL (ref 30.0–36.0)
MCV: 95.4 fL (ref 78.0–100.0)
Platelets: 125 10*3/uL — ABNORMAL LOW (ref 150–400)

## 2010-11-20 LAB — DIFFERENTIAL
Basophils Relative: 0 % (ref 0–1)
Lymphocytes Relative: 15 % (ref 12–46)
Lymphs Abs: 0.8 10*3/uL (ref 0.7–4.0)
Monocytes Absolute: 0.3 10*3/uL (ref 0.1–1.0)
Monocytes Relative: 5 % (ref 3–12)

## 2010-11-20 LAB — LIPID PANEL
HDL: 51 mg/dL (ref >39)
Total CHOL/HDL Ratio: 2.3 RATIO
Triglycerides: 35 mg/dL (ref <150)

## 2010-11-20 LAB — COMPREHENSIVE METABOLIC PANEL
ALT: 20 U/L (ref 0–53)
AST: 28 U/L (ref 0–37)
Albumin: 3.9 g/dL (ref 3.5–5.2)
BUN: 16 mg/dL (ref 6–23)
Calcium: 9.4 mg/dL (ref 8.4–10.5)
Chloride: 102 mEq/L (ref 96–112)
GFR calc non Af Amer: >60 mL/min (ref >60)
Glucose, Bld: 132 mg/dL — ABNORMAL HIGH (ref 70–99)
Potassium: 3.6 mEq/L (ref 3.5–5.1)
Sodium: 135 mEq/L (ref 135–145)
Total Bilirubin: 1.2 mg/dL (ref 0.3–1.2)

## 2010-11-20 LAB — TSH: TSH: 2.292 u[IU]/mL (ref 0.350–4.500)

## 2010-11-20 LAB — URINALYSIS, ROUTINE W REFLEX MICROSCOPIC
Glucose, UA: NEGATIVE mg/dL
Hgb urine dipstick: NEGATIVE
Nitrite: NEGATIVE
Specific Gravity, Urine: 1.012 (ref 1.005–1.030)
pH: 6 (ref 5.0–8.0)

## 2010-11-20 LAB — BASIC METABOLIC PANEL
BUN: 19 mg/dL (ref 6–23)
Calcium: 9.5 mg/dL (ref 8.4–10.5)
Glucose, Bld: 96 mg/dL (ref 70–99)
Sodium: 134 mEq/L — ABNORMAL LOW (ref 135–145)

## 2010-11-20 LAB — TROPONIN I: Troponin I: 0.03 ng/mL (ref 0.00–0.06)

## 2010-11-20 LAB — HEMOGLOBIN A1C
Hgb A1c MFr Bld: 5.7 % — ABNORMAL HIGH (ref <5.7)
Mean Plasma Glucose: 117 mg/dL — ABNORMAL HIGH (ref <117)

## 2010-11-20 LAB — PROTIME-INR: Prothrombin Time: 14 seconds (ref 11.6–15.2)

## 2010-11-20 NOTE — Assessment & Plan Note (Signed)
Summary: H&P   Primary Provider:   [name], MD   History of Present Illness: Primary Electrophysiologist:  Dr. Lewayne Bunting  Jacob Rosales is an 77 yo male with a h/o ICM, CHF, VT, and CHB.   He moved to [facility] from [place] with his wife to live closer to family a couple years ago.  He was dx with CAD in 2007 and he underwent CABG.  Several months later he had recurrent syncope and underwent BiV ICD implant as he had LBBB.  He has subsequently developed CHB and his CHF class is 1-2.  He has recently reached ERI for his device and comes in for evaluation prior to his generator change out.  He denies chest pain, dyspnea or syncope.  No ICD therapies.  No orthopnea or PND.  No edema.  He reports NYHA Class IIb.  Current Medications (verified): 1)  Lisinopril 20 Mg Tabs (Lisinopril) .... One By Mouth Daily 2)  Carvedilol 25 Mg Tabs (Carvedilol) .... Take One Tablet By Mouth Twice A Day 3)  Maxzide-25 37.5-25 Mg Tabs (Triamterene-Hctz) .Marland Kitchen.. 1 Tablet By Mouth Once Daily . 4)  Simvastatin 20 Mg Tabs (Simvastatin) .... Take One Tablet By Mouth Daily At Bedtime 5)  Calcium Carbonate-Vitamin D 600-400 Mg-Unit  Tabs (Calcium Carbonate-Vitamin D) .... Take 1 Tablet By Mouth Two Times A Day 6)  Lorazepam 1 Mg Tabs (Lorazepam) .Marland Kitchen.. 1 Tab As Needed 7)  Vitamin D 1000 Unit Tabs (Cholecalciferol) .... Take One Tablet By Mouth Once Daily. 8)  Aggrenox 25-200 Mg Xr12h-Cap (Aspirin-Dipyridamole) .... One By Mouth Two Times A Day  Allergies: 1)  ! Norvasc 2)  ! Coumadin 3)  ! * Blood Pressure Patch  Past History:  Past Medical History: Current Problems:  AV BLOCK, COMPLETE (ICD-426.0) CHRONIC SYSTOLIC HEART FAILURE (ICD-428.22) CARDIOMYOPATHY, ISCHEMIC (ICD-414.8) AUTOMATIC IMPLANTABLE CARDIAC DEFIBRILLATOR SITU (ICD-V45.02) CAD s/p CABG Prostate CA s/p radiation Rx. Osteoporosis s/p vertebral compression fx's Hypertension Dyslipidemia h/o TIAs  Past Surgical History: CABG Hernia  repair kyphoplasty s/p BiV-ICD  Family History: Reviewed history from 05/25/2009 and no changes required. Negative for premature CAD  Social History: Reviewed history from 05/25/2009 and no changes required. Married No tobacco No ETOH Retired Art gallery manager.  Review of Systems       As per  the HPI.  All other systems reviewed and negative.   Vital Signs:  Patient profile:   77 year old male Height:      70 inches Weight:      153 pounds BMI:     22.03 Pulse rate:   91 / minute Resp:     18 per minute BP sitting:   153 / 94  (left arm)  Vitals Entered By: Kem Parkinson (November 15, 2010 3:09 PM)  Physical Exam  General:  Well developed, well nourished, in no acute distress. Head:  normocephalic and atraumatic Eyes:  PERRLA/EOM intact; conjunctiva and lids normal. Mouth:  Mucus membranes moist. Neck:  Neck supple, no JVD. No masses, thyromegaly or abnormal cervical nodes. Chest Wall:  no deformities noted  Lungs:  Clear bilaterally to auscultation and percussion; no wheezing or rales. Heart:  Normal S1, S2; RRR; 1/6 SEM along LSB. Abdomen:  Bowel sounds positive; abdomen soft and non-tender without masses or organomegaly. Rectal:  Deferred. Msk:  No deformities. Pulses:  DP/PT 1+ bilat. Extremities:  No edema Neurologic:  Alert and oriented x 3; CNs 2-12 intact. Skin:  Warm and dry. Psych:  Normal affect.    ICD Specifications  Following MD:  Lewayne Bunting, MD     ICD Vendor:  St Jude     ICD Model Number:  [phone]     ICD Serial Number:  [id number] ICD DOI:  03/24/2006     ICD Implanting MD:  NOT IMPLANTED HERE  Lead 1:    Location: RA     DOI: 03/24/2006     Model #: 1888TC     Serial #: [id number]     Status: active Lead 2:    Location: RV     DOI: 05/19/2006     Model #: 2130     Serial #: [id number]     Status: active Lead 3:    Location: LV     DOI: 03/24/2006     Model #: 1058T     Serial #: [id number]     Status: active  Indications::  ICM, CHF   ICD Follow Up ICD  Dependent:  Yes      Episodes Coumadin:  No  Brady Parameters Mode DDDR     Lower Rate Limit:  70     Upper Rate Limit 120 PAV 180     Sensed AV Delay:  110  Tachy Zones VF:  200     VT:  179     Impression & Recommendations:  Problem # 1:  AUTOMATIC IMPLANTABLE CARDIAC DEFIBRILLATOR SITU (ICD-V45.02) He is at ERI.  Dr. Ladona Ridgel met with him today to discuss risks, benefits and alternatives of proceeding with generator change out.  All of his questions were answered today.  He is willing to proceed and his procedure will be arranged in the next 1-2 weeks.  Problem # 2:  CHRONIC SYSTOLIC HEART FAILURE (ICD-428.22) Volume is stable on current meds.  Problem # 3:  CAD (ICD-414.00) No anginal symptoms.  Problem # 4:  HYPERTENSION (ICD-401.9) BP somewhat elevated.  Continue to monitor.  BP managed by PCP.  Problem # 5:  DYSLIPIDEMIA (ICD-272.4) Managed by PCP.

## 2010-11-20 NOTE — Letter (Signed)
Summary: Implantable Device Instructions  [facility], Main Office  [address]   [place], [place] [address]   Phone: [phone]  Fax: [phone]      Implantable Device Instructions  You are scheduled for:  _____ Permanent Transvenous Pacemaker _____ Implantable Cardioverter Defibrillator _____ Implantable Loop Recorder ___X__ Generator Change  on ___3/21/12__ with Dr. ____TAYLOR_.  1.  Please arrive at the Short Stay Center at [facility] at _6:30 AM____ on the day of your procedure.  2.  Do not eat or drink the night before your procedure.  3.  Complete lab work on 11/16/10 10:30 AM_____.  The lab at [facility] is open from 8:30 AM to 1:30 PM and from 2:30 PM to 5:00 PM.  The lab at [facility] is open from 7:30 AM to 5:30 PM.  You do not have to be fasting.   4.  Plan for an overnight stay.  Bring your insurance cards and a list of your medications.  6.  Wash your chest and neck with antibacterial soap (any brand) the evening before and the morning of your procedure.  Rinse well.    *If you have ANY questions after you get home, please call the office [phone].  *Every attempt is made to prevent procedures from being rescheduled.  Due to the nauture of Electrophysiology, rescheduling can happen.  The physician is always aware and directs the staff when this occurs.    Appended Document: Implantable Device Instructions PT AWARETO HOLD MAXIDE AM OF PROCEDURE./CY

## 2010-11-20 NOTE — Progress Notes (Signed)
Summary: question re device  Phone Note Call from Patient Call back at Home Phone [phone]   Summary of Call: pt has question re replacing his device. pt wants to know shlould he keep his appt for his device. Initial call taken by: Roe Coombs,  November 08, 2010 3:12 PM  Follow-up for Phone Call        pt have question about device CALL BACK [phone] Judie Grieve  November 09, 2010 3:24 PM spoke with pt on 11/09/10 and cx his device clinic apt for 11/12/10 and sch him to see PA on 11/15/10 same day Dr Ladona Ridgel is in the office  Pt aware.  He needs a gen change per Christophe Louis in device clinic Dennis Bast, RN, BSN  November 12, 2010 3:15 PM

## 2010-11-21 ENCOUNTER — Encounter (HOSPITAL_COMMUNITY): Payer: Medicare Other

## 2010-11-21 ENCOUNTER — Ambulatory Visit (HOSPITAL_COMMUNITY)
Admission: RE | Admit: 2010-11-21 | Discharge: 2010-11-21 | Disposition: A | Discharge status: Home / Self Care | Payer: Medicare Other | Source: Ambulatory Visit | Attending: Internal Medicine | Admitting: Internal Medicine

## 2010-11-21 DIAGNOSIS — Z4502 Encounter for adjustment and management of automatic implantable cardiac defibrillator: Secondary | ICD-10-CM | POA: Insufficient documentation

## 2010-11-21 DIAGNOSIS — I509 Heart failure, unspecified: Secondary | ICD-10-CM | POA: Insufficient documentation

## 2010-11-21 DIAGNOSIS — I428 Other cardiomyopathies: Secondary | ICD-10-CM

## 2010-11-21 DIAGNOSIS — I442 Atrioventricular block, complete: Secondary | ICD-10-CM | POA: Insufficient documentation

## 2010-11-21 HISTORY — PX: OTHER SURGICAL HISTORY: SHX169

## 2010-11-23 ENCOUNTER — Telehealth: Payer: Self-pay | Admitting: Internal Medicine

## 2010-11-26 ENCOUNTER — Telehealth: Payer: Self-pay | Admitting: *Deleted

## 2010-11-26 NOTE — Telephone Encounter (Signed)
Pt calling stating he was upset with our office due to his calls not being returned and he had ? about the swelling at the  defibrillator insertion site--advised i would let my manager know about his complaints with the office and as far as defribrillator site is concerned he said he had no tightness at site, very little discharge, and no signs of infection --advised i would get him an appoint for an  o.v. In 3 months --nt

## 2010-11-26 NOTE — Telephone Encounter (Signed)
Pt calling back about message that was sent on Friday. Pt states he is having swelling and redness around the site.  

## 2010-11-26 NOTE — Telephone Encounter (Signed)
Pt calling back upset that he has left messages Friday and this am with no response, said not having a real urgent problem but frustrated that he hasn't had a call

## 2010-11-26 NOTE — Telephone Encounter (Deleted)
Pt calling back about message that was sent on Friday. Pt states he is having swelling and redness around the site.

## 2010-11-27 NOTE — Telephone Encounter (Signed)
Swelling was the same as the original implant.  It has gone down each day and did not have any evidence of fever or infection Told how to work the after hours message   He has his appointments set up

## 2010-11-29 NOTE — Progress Notes (Signed)
Summary: Question about procedure  Phone Note Call from Patient Call back at Home Phone [phone]   Caller: Patient Reason for Call: Talk to Nurse Summary of Call: Have question about procedure thats for Wed. 11-21-10 Initial call taken by: Judie Grieve,  November 19, 2010 11:40 AM  Follow-up for Phone Call        Phone Call Completed PT CALLING  THOUGHT WAS DISCUSSED AT OFFICE VISIT THAT WAS GOING TO USE ALL THREE WIRES IN NEW DEVICE PT SPOKE WITH [facility]DE REP AND WAS TOLD THERE ARE ONLY 2 WIRES NEEDED. PT WANTING TO KNOW THE TYPE OF DEVICE BE USED. Follow-up by: Scherrie Bateman, LPN,  November 19, 2010 11:53 AM  Additional Follow-up for Phone Call Additional follow up Details #1::        Discussed with Dr Ladona Ridgel He is getting a Unify device  Pt aware Dennis Bast, RN, BSN  November 19, 2010 2:06 PM

## 2010-12-05 ENCOUNTER — Ambulatory Visit (INDEPENDENT_AMBULATORY_CARE_PROVIDER_SITE_OTHER): Payer: Medicare Other | Admitting: *Deleted

## 2010-12-05 DIAGNOSIS — I2589 Other forms of chronic ischemic heart disease: Secondary | ICD-10-CM

## 2010-12-05 DIAGNOSIS — I442 Atrioventricular block, complete: Secondary | ICD-10-CM

## 2010-12-05 DIAGNOSIS — I5022 Chronic systolic (congestive) heart failure: Secondary | ICD-10-CM

## 2010-12-07 NOTE — Op Note (Signed)
  NAME:  AUTHOR, HATLESTAD NO.:  [id number]  MEDICAL RECORD NO.:  [id number]           PATIENT TYPE:  O  LOCATION:  [facility]                         FACILITY:  [facility]  PHYSICIAN:  Doylene Canning. Ladona Ridgel, MD    DATE OF BIRTH:  1928/07/25  DATE OF PROCEDURE:  11/21/2010 DATE OF DISCHARGE:  11/21/2010                              OPERATIVE REPORT   PROCEDURE PERFORMED:  Removal of previously implanted biventricular implantable cardioverter-defibrillator which reached elective replacement indication followed by implantable cardioverter- defibrillator pocket revision, followed by insertion of a new biventricular implantable cardioverter-defibrillator with defibrillation threshold testing.  INTRODUCTION:  The patient is an 77 year old man with a longstanding cardiomyopathy and complete heart block, congestive heart failure status post biventricular ICD insertion years ago.  He had reached device elective replacement indication and is now referred for insertion of new device.  PROCEDURE:  After informed consent was obtained, the patient was taken to the diagnostic EP lab in a fasting state.  After usual preparation and draping, intravenous fentanyl and midazolam were given for sedation. A 30 mL of lidocaine was infiltrated into the left infraclavicular region below the ICD insertion site.  A 5-cm incision was carried out over this region, electrocautery was utilized to dissect down to the ICD pocket.  After freeing up the fibrous adhesions which were fairly extensive, the generator was removed with traction.  The leads were disconnected from the generator with care taking to maintain pacing by way of the LV lead.  The pocket was irrigated, and at this point, electrocautery was utilized to revise the pocket to accommodate the different shaped device.  At this point, the St. Jude Unify BiV ICD, serial number [id number], was connected to the old defibrillator and then new LV pacing leads  were placed back in the subcutaneous pocket.  The leads were evaluated and found to be working satisfactorily.  The thresholds were less than a volt at 0.5 milliseconds with the exception of the LV lead which was 1.25 volts at 0.8 milliseconds.  With these satisfactory parameters, the pocket was irrigated with additional antibiotic irrigation.  The patient was more deeply sedated for defibrillation threshold testing.  After the patient was more deeply sedated with fentanyl and Versed, VF was induced with T-wave shock.  A 20-joule shock was subsequently delivered which terminated in ventricular fibrillation and restored sinus rhythm.  At this point, no additional defibrillation threshold testing was carried out, the incision was closed with 2-0 and 3-0 Vicryl.  Benzoin and Steri-Strips were painted on the skin, a pressure dressing was applied, and the patient was returned to his room in satisfactory condition.  COMPLICATIONS:  There were no immediate procedure complications.  RESULTS:  Demonstrate successful implantation with a removal of a previously implanted St. Jude BiV ICD which had reached ERI followed by ICD pocket revision, followed by insertion of a new BiV ICD with defibrillation threshold testing.     Doylene Canning. Ladona Ridgel, MD     GWT/MEDQ  D:  11/21/2010  T:  11/22/2010  Job:  [id number]  Electronically Signed by Lewayne Bunting MD on 12/07/2010 06:47:26 AM

## 2011-02-04 ENCOUNTER — Encounter: Payer: Self-pay | Admitting: Internal Medicine

## 2011-02-20 ENCOUNTER — Encounter: Payer: Self-pay | Admitting: Internal Medicine

## 2011-02-20 ENCOUNTER — Ambulatory Visit (INDEPENDENT_AMBULATORY_CARE_PROVIDER_SITE_OTHER): Payer: Medicare Other | Admitting: Internal Medicine

## 2011-02-20 DIAGNOSIS — I442 Atrioventricular block, complete: Secondary | ICD-10-CM

## 2011-02-20 DIAGNOSIS — Z95810 Presence of automatic (implantable) cardiac defibrillator: Secondary | ICD-10-CM

## 2011-02-20 NOTE — Patient Instructions (Signed)
Your physician wants you to follow-up in: 12 months with Dr Taylor You will receive a reminder letter in the mail two months in advance. If you don't receive a letter, please call our office to schedule the follow-up appointment.   Remote monitoring is used to monitor your Pacemaker of ICD from home. This monitoring reduces the number of office visits required to check your device to one time per year. It allows us to keep an eye on the functioning of your device to ensure it is working properly. You are scheduled for a device check from home on 05/23/2011. You may send your transmission at any time that day. If you have a wireless device, the transmission will be sent automatically. After your physician reviews your transmission, you will receive a postcard with your next transmission date.   

## 2011-02-20 NOTE — Assessment & Plan Note (Signed)
His device is working normally. His incision has healed well. Will recheck in several months.

## 2011-02-20 NOTE — Progress Notes (Signed)
HPI [name] returns today for followup. He is an 77 year old male with a history of an ischemic cardiomyopathy, congestive heart failure, status post biventricular ICD. The patient has done well since his generator was changed out several months ago. He denies fevers or chills and has had no pain or other symptoms. He remains active exercising on a regular basis. He denies syncope or any ICD shocks. Allergies  Allergen Reactions  . Amlodipine Besylate     REACTION: Swelling  . Warfarin Sodium     REACTION: Stomach bleeding     Current Outpatient Prescriptions  Medication Sig Dispense Refill  . calcium carbonate (OS-CAL) 600 MG TABS Take 600 mg by mouth daily.        . carvedilol (COREG) 25 MG tablet Take 25 mg by mouth 2 (two) times daily with a meal.        . cholecalciferol (VITAMIN D) 1000 UNITS tablet Take 1,000 Units by mouth daily.        Marland Kitchen dipyridamole-aspirin (AGGRENOX) 25-200 MG per 12 hr capsule Take 1 capsule by mouth 2 (two) times daily.        Marland Kitchen lisinopril (PRINIVIL,ZESTRIL) 20 MG tablet Take 20 mg by mouth daily.        Marland Kitchen LORazepam (ATIVAN) 0.5 MG tablet Take 0.5 mg by mouth at bedtime.        . simvastatin (ZOCOR) 20 MG tablet Take 20 mg by mouth at bedtime.        . triamterene-hydrochlorothiazide (DYAZIDE) 37.5-25 MG per capsule Take 1 capsule by mouth every morning.           Past Medical History  Diagnosis Date  . Complete AV block   . Chronic systolic heart failure   . Cardiomyopathy, ischemic   . Automatic implantable cardiac defibrillator in situ   . CAD (coronary artery disease) of artery bypass graft   . Prostate cancer     S/P radiation rx  . Osteoporosis     A/P Vertebral compression fx's  . Hypertension   . Dyslipidemia   . TIA (transient ischemic attack)     H/o    ROS:   All systems reviewed and negative except as noted in the HPI.   Past Surgical History  Procedure Date  . Coronary artery bypass graft   . Hernia repair   . Kyphosis  surgery   . Biv-icd     S/P  . Cardiac defibrillator placement      Family History  Problem Relation Age of Onset  . Coronary artery disease Neg Hx      History   Social History  . Marital Status: Married    Spouse Name: N/A    Number of Children: N/A  . Years of Education: N/A   Occupational History  . Retired Art gallery manager    Social History Main Topics  . Smoking status: Former Games developer  . Smokeless tobacco: Not on file  . Alcohol Use: No  . Drug Use: Not on file  . Sexually Active: Not on file   Other Topics Concern  . Not on file   Social History Narrative   Married     BP 123/83  Pulse 95  Resp 16  Ht 5\' 10"  (1.778 m)  Wt 149 lb (67.586 kg)  BMI 21.38 kg/m2  Physical Exam:  Well appearing NAD HEENT: Unremarkable Neck:  No JVD, no thyromegally Lymphatics:  No adenopathy Back:  No CVA tenderness Lungs:  Clear. Well-healed pacemaker incision. HEART:  Regular  rate rhythm, no murmurs, no rubs, no clicks Abd:  Flat, positive bowel sounds, no organomegally, no rebound, no guarding Ext:  2 plus pulses, no edema, no cyanosis, no clubbing Skin:  No rashes no nodules Neuro:  CN II through XII intact, motor grossly intact  DEVICE  Normal device function.  See PaceArt for details.   Assess/Plan:

## 2011-05-23 ENCOUNTER — Ambulatory Visit (INDEPENDENT_AMBULATORY_CARE_PROVIDER_SITE_OTHER): Payer: Medicare Other | Admitting: *Deleted

## 2011-05-23 DIAGNOSIS — I428 Other cardiomyopathies: Secondary | ICD-10-CM

## 2011-05-24 ENCOUNTER — Other Ambulatory Visit: Payer: Self-pay | Admitting: Internal Medicine

## 2011-05-24 ENCOUNTER — Encounter: Payer: Self-pay | Admitting: Internal Medicine

## 2011-05-24 LAB — REMOTE ICD DEVICE
AL AMPLITUDE: 4.9 mv
ATRIAL PACING ICD: 90 pct
BAMS-0001: 170 {beats}/min
DEVICE MODEL ICD: 631860
LV LEAD IMPEDENCE ICD: 630 Ohm
RV LEAD AMPLITUDE: 12 mv
TZAT-0001SLOWVT: 1
TZAT-0004SLOWVT: 8
TZAT-0019SLOWVT: 7.5 V
TZON-0004SLOWVT: 24
TZON-0010SLOWVT: 40 ms
TZST-0001SLOWVT: 3
TZST-0001SLOWVT: 4
TZST-0003SLOWVT: 20 J
TZST-0003SLOWVT: 40 J
VENTRICULAR PACING ICD: 99 pct

## 2011-05-26 ENCOUNTER — Encounter: Payer: Self-pay | Admitting: *Deleted

## 2011-05-29 ENCOUNTER — Encounter: Payer: Self-pay | Admitting: Internal Medicine

## 2011-05-31 NOTE — Progress Notes (Signed)
ICD checked by remote. 

## 2011-06-05 ENCOUNTER — Encounter: Payer: Self-pay | Admitting: *Deleted

## 2011-06-07 ENCOUNTER — Telehealth: Payer: Self-pay | Admitting: Internal Medicine

## 2011-06-07 NOTE — Telephone Encounter (Signed)
Pt wanting someone to call him. Pt was under the impression that his implanted device would automatically transmit report on 9/20. Pt has yet to hear back if this transmission was received. Please return pt call to discuss further.

## 2011-06-10 NOTE — Telephone Encounter (Signed)
Pt received result letter.

## 2011-08-22 ENCOUNTER — Ambulatory Visit (INDEPENDENT_AMBULATORY_CARE_PROVIDER_SITE_OTHER): Payer: Medicare Other | Admitting: *Deleted

## 2011-08-22 DIAGNOSIS — I2589 Other forms of chronic ischemic heart disease: Secondary | ICD-10-CM

## 2011-08-22 DIAGNOSIS — I5022 Chronic systolic (congestive) heart failure: Secondary | ICD-10-CM

## 2011-08-22 DIAGNOSIS — I442 Atrioventricular block, complete: Secondary | ICD-10-CM

## 2011-08-23 ENCOUNTER — Other Ambulatory Visit: Payer: Self-pay | Admitting: Internal Medicine

## 2011-08-23 ENCOUNTER — Encounter: Payer: Self-pay | Admitting: Internal Medicine

## 2011-08-23 LAB — REMOTE ICD DEVICE
AL AMPLITUDE: 5 mv
ATRIAL PACING ICD: 1 pct
DEVICE MODEL ICD: 631860
MODE SWITCH EPISODES: 2
TZAT-0001SLOWVT: 1
TZAT-0013SLOWVT: 3
TZAT-0019SLOWVT: 7.5 V
TZAT-0020SLOWVT: 1 ms
TZON-0005SLOWVT: 6
TZST-0001SLOWVT: 2
TZST-0001SLOWVT: 4
TZST-0003SLOWVT: 20 J
TZST-0003SLOWVT: 36 J
VF: 1

## 2011-08-28 NOTE — Progress Notes (Signed)
ICD remote 

## 2011-09-04 ENCOUNTER — Encounter: Payer: Self-pay | Admitting: *Deleted

## 2011-10-07 DIAGNOSIS — E559 Vitamin D deficiency, unspecified: Secondary | ICD-10-CM | POA: Insufficient documentation

## 2011-11-28 ENCOUNTER — Encounter: Payer: Self-pay | Admitting: Internal Medicine

## 2011-11-28 ENCOUNTER — Ambulatory Visit (INDEPENDENT_AMBULATORY_CARE_PROVIDER_SITE_OTHER): Payer: Medicare Other | Admitting: *Deleted

## 2011-11-28 ENCOUNTER — Telehealth: Payer: Self-pay | Admitting: Internal Medicine

## 2011-11-28 DIAGNOSIS — I442 Atrioventricular block, complete: Secondary | ICD-10-CM

## 2011-11-28 DIAGNOSIS — Z95810 Presence of automatic (implantable) cardiac defibrillator: Secondary | ICD-10-CM

## 2011-11-28 DIAGNOSIS — I5022 Chronic systolic (congestive) heart failure: Secondary | ICD-10-CM

## 2011-11-28 LAB — REMOTE ICD DEVICE
AL AMPLITUDE: 4.3 mv
ATRIAL PACING ICD: 91 pct
BAMS-0003: 70 {beats}/min
DEVICE MODEL ICD: 631860
HV IMPEDENCE: 39 Ohm
LV LEAD IMPEDENCE ICD: 590 Ohm
RV LEAD IMPEDENCE ICD: 350 Ohm
TZAT-0001SLOWVT: 1
TZAT-0019SLOWVT: 7.5 V
TZAT-0020SLOWVT: 1 ms
TZON-0004SLOWVT: 24
TZON-0005SLOWVT: 6
TZON-0010SLOWVT: 40 ms
TZST-0001SLOWVT: 2
TZST-0001SLOWVT: 4
TZST-0003SLOWVT: 20 J
VENTRICULAR PACING ICD: 96 pct

## 2011-11-28 NOTE — Telephone Encounter (Signed)
No date has been set for ICD support group and pt aware. Pt to receive letter when meeting is scheduled. Pt aware.

## 2011-11-28 NOTE — Telephone Encounter (Signed)
New Msg: pt calling wanting to speak with nurse/MD to find out if/when the ICD symposium is this year. Please return pt call to discuss further.

## 2011-12-04 NOTE — Progress Notes (Signed)
Remote icd check w/icm  

## 2011-12-11 ENCOUNTER — Encounter: Payer: Self-pay | Admitting: *Deleted

## 2012-02-25 ENCOUNTER — Encounter: Payer: Self-pay | Admitting: Internal Medicine

## 2012-02-25 ENCOUNTER — Ambulatory Visit (INDEPENDENT_AMBULATORY_CARE_PROVIDER_SITE_OTHER): Payer: Medicare Other | Admitting: Internal Medicine

## 2012-02-25 VITALS — BP 130/64 | Resp 18 | Ht 69.0 in | Wt 152.1 lb

## 2012-02-25 DIAGNOSIS — I5022 Chronic systolic (congestive) heart failure: Secondary | ICD-10-CM

## 2012-02-25 DIAGNOSIS — I442 Atrioventricular block, complete: Secondary | ICD-10-CM

## 2012-02-25 DIAGNOSIS — Z95810 Presence of automatic (implantable) cardiac defibrillator: Secondary | ICD-10-CM

## 2012-02-25 LAB — ICD DEVICE OBSERVATION
AL AMPLITUDE: 4.5 mv
AL THRESHOLD: 1 V
ATRIAL PACING ICD: 91 pct
DEVICE MODEL ICD: 631860
HV IMPEDENCE: 40 Ohm
RV LEAD IMPEDENCE ICD: 350 Ohm
TZAT-0001SLOWVT: 1
TZAT-0018SLOWVT: NEGATIVE
TZAT-0019SLOWVT: 7.5 V
TZAT-0020SLOWVT: 1 ms
TZON-0003SLOWVT: 335 ms
TZON-0004SLOWVT: 24
TZON-0010SLOWVT: 40 ms
TZST-0001SLOWVT: 2
TZST-0001SLOWVT: 4
TZST-0003SLOWVT: 20 J
TZST-0003SLOWVT: 40 J

## 2012-02-25 NOTE — Assessment & Plan Note (Signed)
His device is working normally. Will recheck in several months. 

## 2012-02-25 NOTE — Patient Instructions (Addendum)
Remote monitoring is used to monitor your Pacemaker of ICD from home. This monitoring reduces the number of office visits required to check your device to one time per year. It allows us to keep an eye on the functioning of your device to ensure it is working properly. You are scheduled for a device check from home on May 25, 2012. You may send your transmission at any time that day. If you have a wireless device, the transmission will be sent automatically. After your physician reviews your transmission, you will receive a postcard with your next transmission date.  Your physician wants you to follow-up in: 1 year with Dr Taylor.  You will receive a reminder letter in the mail two months in advance. If you don't receive a letter, please call our office to schedule the follow-up appointment.  

## 2012-02-25 NOTE — Progress Notes (Signed)
HPI [name] returns today for followup. He is a pleasant 78 yo man with an ICM, chronic systolic CHF, and is s/p ICD implant. He has done well. He denies chest pain or sob. No syncope. No ICD shocks. No peripheral edema. Allergies  Allergen Reactions  . Amlodipine Besylate     REACTION: Swelling  . Warfarin Sodium     REACTION: Stomach bleeding     Current Outpatient Prescriptions  Medication Sig Dispense Refill  . calcium carbonate (OS-CAL) 600 MG TABS Take 600 mg by mouth daily.        . carvedilol (COREG) 25 MG tablet Take 25 mg by mouth 2 (two) times daily with a meal.        . cholecalciferol (VITAMIN D) 1000 UNITS tablet Take 1,000 Units by mouth daily.        Marland Kitchen dipyridamole-aspirin (AGGRENOX) 25-200 MG per 12 hr capsule Take 1 capsule by mouth 2 (two) times daily.        Marland Kitchen lisinopril (PRINIVIL,ZESTRIL) 20 MG tablet Take 20 mg by mouth daily.        Marland Kitchen LORazepam (ATIVAN) 0.5 MG tablet Take 0.5 mg by mouth at bedtime.        . simvastatin (ZOCOR) 20 MG tablet Take 20 mg by mouth at bedtime.        . triamterene-hydrochlorothiazide (DYAZIDE) 37.5-25 MG per capsule Take 1 capsule by mouth every morning.           Past Medical History  Diagnosis Date  . Complete AV block   . Chronic systolic heart failure   . Cardiomyopathy, ischemic   . Automatic implantable cardiac defibrillator in situ   . CAD (coronary artery disease) of artery bypass graft   . Prostate cancer     S/P radiation rx  . Osteoporosis     A/P Vertebral compression fx's  . Hypertension   . Dyslipidemia   . TIA (transient ischemic attack)     H/o    ROS:   All systems reviewed and negative except as noted in the HPI.   Past Surgical History  Procedure Date  . Coronary artery bypass graft   . Hernia repair   . Kyphosis surgery   . Biv-icd     S/P  . Cardiac defibrillator placement      Family History  Problem Relation Age of Onset  . Coronary artery disease Neg Hx      History   Social  History  . Marital Status: Married    Spouse Name: N/A    Number of Children: N/A  . Years of Education: N/A   Occupational History  . Retired Art gallery manager    Social History Main Topics  . Smoking status: Former Games developer  . Smokeless tobacco: Not on file  . Alcohol Use: No  . Drug Use: Not on file  . Sexually Active: Not on file   Other Topics Concern  . Not on file   Social History Narrative   Married     BP 130/64  Resp 18  Ht 5\' 9"  (1.753 m)  Wt 152 lb 1.9 oz (69.001 kg)  BMI 22.46 kg/m2  Physical Exam:  Well appearing NAD HEENT: Unremarkable Neck:  No JVD, no thyromegally Lungs:  Clear with no wheezes, rales, or rhonchi HEART:  Regular rate rhythm, no murmurs, no rubs, no clicks Abd:  soft, positive bowel sounds, no organomegally, no rebound, no guarding Ext:  2 plus pulses, no edema, no cyanosis, no clubbing Skin:  No rashes no nodules Neuro:  CN II through XII intact, motor grossly intact  DEVICE  Normal device function.  See PaceArt for details.   Assess/Plan:

## 2012-02-25 NOTE — Assessment & Plan Note (Signed)
His symptoms are well controlled. He will continue his current meds and maintain a low sodium diet. 

## 2012-05-25 ENCOUNTER — Encounter: Payer: Medicare Other | Admitting: *Deleted

## 2012-05-28 ENCOUNTER — Encounter: Payer: Self-pay | Admitting: *Deleted

## 2012-06-03 ENCOUNTER — Encounter: Payer: Self-pay | Admitting: Internal Medicine

## 2012-06-03 ENCOUNTER — Ambulatory Visit (INDEPENDENT_AMBULATORY_CARE_PROVIDER_SITE_OTHER): Payer: Medicare Other | Admitting: *Deleted

## 2012-06-03 ENCOUNTER — Telehealth: Payer: Self-pay | Admitting: Internal Medicine

## 2012-06-03 DIAGNOSIS — Z95810 Presence of automatic (implantable) cardiac defibrillator: Secondary | ICD-10-CM

## 2012-06-03 DIAGNOSIS — I5022 Chronic systolic (congestive) heart failure: Secondary | ICD-10-CM

## 2012-06-03 NOTE — Telephone Encounter (Signed)
Spoke w/pt and answered all questions in regards to sending transmission. Pt to try and resend. Pt will call back as soon sent.

## 2012-06-03 NOTE — Telephone Encounter (Signed)
plz return call to patient [phone] regarding remote transmission

## 2012-06-05 ENCOUNTER — Encounter: Payer: Self-pay | Admitting: *Deleted

## 2012-06-05 LAB — REMOTE ICD DEVICE
AL IMPEDENCE ICD: 380 Ohm
ATRIAL PACING ICD: 91 pct
BAMS-0001: 170 {beats}/min
BAMS-0003: 70 {beats}/min
DEVICE MODEL ICD: 631860
HV IMPEDENCE: 44 Ohm
TZAT-0001SLOWVT: 1
TZAT-0004SLOWVT: 8
TZON-0003SLOWVT: 335 ms
TZON-0010SLOWVT: 40 ms
TZST-0001SLOWVT: 3
TZST-0001SLOWVT: 5
TZST-0003SLOWVT: 20 J
TZST-0003SLOWVT: 40 J
VENTRICULAR PACING ICD: 94 pct

## 2012-06-10 ENCOUNTER — Encounter: Payer: Self-pay | Admitting: *Deleted

## 2012-09-07 ENCOUNTER — Ambulatory Visit (INDEPENDENT_AMBULATORY_CARE_PROVIDER_SITE_OTHER): Payer: Medicare Other | Admitting: *Deleted

## 2012-09-07 ENCOUNTER — Encounter: Payer: Self-pay | Admitting: Internal Medicine

## 2012-09-07 DIAGNOSIS — I2589 Other forms of chronic ischemic heart disease: Secondary | ICD-10-CM

## 2012-09-07 DIAGNOSIS — I5022 Chronic systolic (congestive) heart failure: Secondary | ICD-10-CM

## 2012-09-07 DIAGNOSIS — Z95810 Presence of automatic (implantable) cardiac defibrillator: Secondary | ICD-10-CM

## 2012-09-09 LAB — REMOTE ICD DEVICE
AL IMPEDENCE ICD: 380 Ohm
BAMS-0001: 170 {beats}/min
RV LEAD AMPLITUDE: 12 mv
TZAT-0001SLOWVT: 1
TZAT-0012SLOWVT: 200 ms
TZAT-0013SLOWVT: 3
TZAT-0019SLOWVT: 7.5 V
TZAT-0020SLOWVT: 1 ms
TZON-0003SLOWVT: 335 ms
TZST-0001SLOWVT: 2
TZST-0001SLOWVT: 4
TZST-0003SLOWVT: 36 J
VENTRICULAR PACING ICD: 100 pct

## 2012-09-15 ENCOUNTER — Encounter: Payer: Self-pay | Admitting: *Deleted

## 2012-09-29 ENCOUNTER — Encounter: Payer: Self-pay | Admitting: Internal Medicine

## 2012-10-03 HISTORY — PX: CYST EXCISION: SHX5701

## 2012-10-05 DIAGNOSIS — M545 Low back pain: Secondary | ICD-10-CM | POA: Insufficient documentation

## 2012-12-01 HISTORY — PX: CYST REMOVAL NECK: SHX6281

## 2012-12-07 ENCOUNTER — Encounter: Payer: Self-pay | Admitting: Internal Medicine

## 2012-12-07 ENCOUNTER — Other Ambulatory Visit: Payer: Self-pay | Admitting: Internal Medicine

## 2012-12-07 ENCOUNTER — Ambulatory Visit (INDEPENDENT_AMBULATORY_CARE_PROVIDER_SITE_OTHER): Payer: Medicare Other | Admitting: *Deleted

## 2012-12-07 DIAGNOSIS — I2589 Other forms of chronic ischemic heart disease: Secondary | ICD-10-CM

## 2012-12-07 DIAGNOSIS — I5022 Chronic systolic (congestive) heart failure: Secondary | ICD-10-CM

## 2012-12-07 DIAGNOSIS — Z95810 Presence of automatic (implantable) cardiac defibrillator: Secondary | ICD-10-CM

## 2012-12-08 LAB — REMOTE ICD DEVICE
ATRIAL PACING ICD: 91 pct
BAMS-0003: 70 {beats}/min
DEVICE MODEL ICD: 631860
HV IMPEDENCE: 41 Ohm
LV LEAD IMPEDENCE ICD: 710 Ohm
RV LEAD IMPEDENCE ICD: 350 Ohm
TZAT-0001SLOWVT: 1
TZAT-0012SLOWVT: 200 ms
TZAT-0019SLOWVT: 7.5 V
TZAT-0020SLOWVT: 1 ms
TZON-0004SLOWVT: 24
TZON-0005SLOWVT: 6
TZON-0010SLOWVT: 40 ms
TZST-0001SLOWVT: 2
TZST-0001SLOWVT: 4
TZST-0003SLOWVT: 20 J
VENTRICULAR PACING ICD: 100 pct

## 2012-12-22 ENCOUNTER — Other Ambulatory Visit: Payer: Self-pay

## 2012-12-22 MED ORDER — ASPIRIN-DIPYRIDAMOLE ER 25-200 MG PO CP12: ORAL_CAPSULE | ORAL | Status: DC

## 2012-12-22 MED ORDER — CARVEDILOL 25 MG PO TABS: ORAL_TABLET | ORAL | Status: DC

## 2012-12-25 ENCOUNTER — Encounter: Payer: Self-pay | Admitting: *Deleted

## 2012-12-28 ENCOUNTER — Telehealth: Payer: Self-pay | Admitting: Internal Medicine

## 2012-12-28 NOTE — Telephone Encounter (Signed)
New Prob    Pt states he did not get a report from Dr. Ladona Ridgel regarding check. Calling to follow up on that.

## 2012-12-28 NOTE — Telephone Encounter (Signed)
Transmission received, patient aware. 

## 2013-01-11 ENCOUNTER — Ambulatory Visit (INDEPENDENT_AMBULATORY_CARE_PROVIDER_SITE_OTHER): Payer: Medicare Other | Admitting: *Deleted

## 2013-01-11 ENCOUNTER — Encounter: Payer: Self-pay | Admitting: Internal Medicine

## 2013-01-11 DIAGNOSIS — Z95810 Presence of automatic (implantable) cardiac defibrillator: Secondary | ICD-10-CM

## 2013-01-11 DIAGNOSIS — I5022 Chronic systolic (congestive) heart failure: Secondary | ICD-10-CM

## 2013-01-13 ENCOUNTER — Non-Acute Institutional Stay: Payer: Medicare Other | Admitting: Geriatric Medicine

## 2013-01-13 VITALS — BP 130/72 | HR 68 | Ht 69.0 in | Wt 155.0 lb

## 2013-01-13 DIAGNOSIS — I10 Essential (primary) hypertension: Secondary | ICD-10-CM

## 2013-01-13 DIAGNOSIS — Z7189 Other specified counseling: Secondary | ICD-10-CM

## 2013-01-13 LAB — REMOTE ICD DEVICE
ATRIAL PACING ICD: 92 pct
DEVICE MODEL ICD: 631860
LV LEAD IMPEDENCE ICD: 700 Ohm
RV LEAD IMPEDENCE ICD: 350 Ohm
TZAT-0001SLOWVT: 1
TZAT-0019SLOWVT: 7.5 V
TZAT-0020SLOWVT: 1 ms
TZON-0004SLOWVT: 24
TZON-0005SLOWVT: 6
TZON-0010SLOWVT: 40 ms
TZST-0001SLOWVT: 4
TZST-0003SLOWVT: 20 J
TZST-0003SLOWVT: 40 J
VENTRICULAR PACING ICD: 100 pct

## 2013-01-13 NOTE — Progress Notes (Signed)
Patient ID: Jacob Rosales, male   DOB: 1928/06/21, 79 y.o.   MRN: [id number] [facility] [phone])  Chief Complaint  Patient presents with  . Advanced Directives    HPI: This 79 year old male resident of [facility], Independent Living section presents to clinic today to discuss advanced directives. Specifically DO NOT RESUSCITATE and Medical Orders for Scope of Treatment (MOST) form. The patient feels well today and has a very good quality of life at this time. He would like to not be in a situation where machines are providing life support. The discussion about appropriateness of DNR order is complicated due the presence of his internal pacemaker and defibrillator. I have him to discuss this with Dr.Taylor.  We discussed the decisions required to complete the MOST form. Section A is not completed today.  B. Limited additional interventions, accepts hospital transfer if indicated, avoid Intensive Care. C. Use antibiotics D. Use IVF for defined period. No feeding tube.  Allergies  Allergies  Allergen Reactions  . Amlodipine Besylate     REACTION: Swelling  . Warfarin Sodium     REACTION: Stomach bleeding   Medications  Reviewed   Review of Systems  DATA OBTAINED: from patient GENERAL: Feels well  No fevers, fatigue, change in activity status, appetite, or weight  RESPIRATORY: No cough, wheezing, SOB CARDIAC: No chest pain, palpitations. No edema GI: No abdominal pain  No N/V/D or constipation  No heartburn or reflux  MUSCULOSKELETAL: No joint pain, swelling or stiffness No back pain No muscle ache, pain, weakness Gait is steady No recent falls  NEUROLOGIC: No dizziness, fainting, headache No change in mental status  PSYCHIATRIC: No feelings of anxiety, depression  Sleeps well    Physical Exam Filed Vitals:   01/13/13 1440  BP: 130/72  Pulse: 68  Height: 5\' 9"  (1.753 m)  Weight: 155 lb (70.308 kg)   Body mass index is 22.88  kg/(m^2).  GENERAL APPEARANCE: No acute distress, appropriately groomed, normal body habitus Alert, pleasant, conversant. HEAD: Normocephalic, atraumatic EYES: Conjunctiva/lids clear  RESPIRATORY: Breathing is even, unlabored  MUSCULOSKELETAL.  Gait is steady PSYCHIATRIC: Mood and affect appropriate to situation   ASSESSMENT/PLAN  Advanced care planning/counseling discussion Extensive discussion today with patient regarding DO NOT RESUSCITATE and medical orders for scope of treatment (MOST) This patient indicates he would like to not be in a situation where machines are providing life support. The discussion about appropriateness of DNR order is complicated due the presence of his internal pacemaker and defibrillator. I have asked him to discuss this with Dr.Taylor. Other decisions have been made relevant to MOST form: Section A is not completed today.  B. Limited additional interventions, accepts hospital transfer if indicated, avoid Intensive Care. C. Use antibiotics D. Use IVF for defined period. No feeding tube.    HYPERTENSION Blood pressure stable, continue current medications   Follow up: Keep scheduled appointment with Dr.Green   Jacob Rosales T.Jacob Coombs, NP-C 01/13/2013

## 2013-01-14 DIAGNOSIS — Z7189 Other specified counseling: Secondary | ICD-10-CM | POA: Insufficient documentation

## 2013-01-14 NOTE — Assessment & Plan Note (Signed)
Extensive discussion today with patient regarding DO NOT RESUSCITATE and medical orders for scope of treatment (MOST) This patient indicates he would like to not be in a situation where machines are providing life support. The discussion about appropriateness of DNR order is complicated due the presence of his internal pacemaker and defibrillator. I have asked him to discuss this with Dr.Taylor. Other decisions have been made relevant to MOST form: Section A is not completed today.  B. Limited additional interventions, accepts hospital transfer if indicated, avoid Intensive Care. C. Use antibiotics D. Use IVF for defined period. No feeding tube.

## 2013-01-14 NOTE — Assessment & Plan Note (Signed)
Blood pressure stable, continue current medications.  ?

## 2013-02-01 ENCOUNTER — Encounter: Payer: Self-pay | Admitting: Internal Medicine

## 2013-02-01 ENCOUNTER — Non-Acute Institutional Stay: Payer: Medicare Other | Admitting: Internal Medicine

## 2013-02-01 VITALS — BP 114/72 | HR 62 | Temp 96.1°F | Ht 70.0 in | Wt 151.0 lb

## 2013-02-01 DIAGNOSIS — S80869A Insect bite (nonvenomous), unspecified lower leg, initial encounter: Secondary | ICD-10-CM

## 2013-02-01 DIAGNOSIS — I10 Essential (primary) hypertension: Secondary | ICD-10-CM | POA: Insufficient documentation

## 2013-02-01 DIAGNOSIS — J3489 Other specified disorders of nose and nasal sinuses: Secondary | ICD-10-CM

## 2013-02-01 DIAGNOSIS — I4891 Unspecified atrial fibrillation: Secondary | ICD-10-CM

## 2013-02-01 DIAGNOSIS — S90569A Insect bite (nonvenomous), unspecified ankle, initial encounter: Secondary | ICD-10-CM

## 2013-02-01 DIAGNOSIS — W57XXXA Bitten or stung by nonvenomous insect and other nonvenomous arthropods, initial encounter: Secondary | ICD-10-CM

## 2013-02-01 MED ORDER — IPRATROPIUM BROMIDE 0.06 % NA SOLN: NASAL | Status: DC

## 2013-02-01 NOTE — Progress Notes (Signed)
  Subjective:    Patient ID: Jacob Rosales, male    DOB: 11-07-1927, 79 y.o.   MRN: [id number]  HPI Tick on the left leg on last Tuesday, 01/26/13. Now has erythematous spreading rash going down the left shin. No pain, fever, or itching.   Review of Systems  Constitutional: Negative for fever, activity change, appetite change, fatigue and unexpected weight change.  HENT: Positive for rhinorrhea. Negative for ear pain, facial swelling, neck pain and sinus pressure.   Eyes: Negative for photophobia, pain, discharge, redness and itching.  Cardiovascular: Negative for chest pain, palpitations and leg swelling.       PAF.AICD.  Gastrointestinal: Negative for nausea, abdominal pain, abdominal distention and rectal pain.       HX. Fecal incontinence.  Endocrine: Negative.   Genitourinary:       Hx urinary incontinence  Musculoskeletal: Positive for myalgias and back pain.  Skin:       Hemorrhagic rash left shin.  Neurological: Negative.   Hematological: Negative.   Psychiatric/Behavioral: Negative.        Objective: BP 114/72  Pulse 62  Temp(Src) 96.1 F (35.6 C) (Oral)  Ht 5\' 10"  (1.778 m)  Wt 151 lb (68.493 kg)  BMI 21.67 kg/m2    Physical Exam  Constitutional:  Frail elderly male.  HENT:  Right Ear: External ear normal.  Left Ear: External ear normal.  Nose: Nose normal.  Eyes: Conjunctivae and EOM are normal. Left eye exhibits no discharge.  Neck: Normal range of motion. Neck supple. No JVD present. No tracheal deviation present. No thyromegaly present.  Cardiovascular: Normal rate and regular rhythm.  Exam reveals no gallop and no friction rub.   No murmur heard. Pulmonary/Chest: Effort normal. No respiratory distress. He has no wheezes. He has no rales.  Abdominal: Soft. Bowel sounds are normal. He exhibits no distension and no mass. There is no tenderness.  Musculoskeletal: Normal range of motion. He exhibits no edema and no tenderness.  Lymphadenopathy:    He has no  cervical adenopathy.  Neurological: He is alert. He has normal reflexes. No cranial nerve deficit.  Skin: Skin is warm and dry. Rash noted.  Tick bite left leg at shin. Dark spot at top of a hemorrhagic rash. He is on Aggrenox.  Psychiatric: He has a normal mood and affect. His behavior is normal. Judgment and thought content normal.          Assessment & Plan:  1. Hypertension controlled  2. Atrial fibrillation Currently in NSR  3. Bite by nonvenomous insect of hip/thigh/leg/ankle, unspecified laterality, initial encounter tick bite. Hemorrhagic area on the shin, but no evidence of pther associated problems  4. Rhinorrhea improved on ipratropium nasal spray 0.06%

## 2013-03-16 ENCOUNTER — Encounter: Payer: Self-pay | Admitting: Internal Medicine

## 2013-03-16 ENCOUNTER — Ambulatory Visit (INDEPENDENT_AMBULATORY_CARE_PROVIDER_SITE_OTHER): Payer: Medicare Other | Admitting: Internal Medicine

## 2013-03-16 VITALS — BP 149/82 | HR 63 | Ht 70.0 in | Wt 153.0 lb

## 2013-03-16 DIAGNOSIS — I442 Atrioventricular block, complete: Secondary | ICD-10-CM

## 2013-03-16 DIAGNOSIS — Z95810 Presence of automatic (implantable) cardiac defibrillator: Secondary | ICD-10-CM

## 2013-03-16 DIAGNOSIS — I5022 Chronic systolic (congestive) heart failure: Secondary | ICD-10-CM

## 2013-03-16 NOTE — Progress Notes (Signed)
HPI [name] returns today for followup. He is a very pleasant 79 year old man with a history of coronary artery disease, status post myocardial infarction, status post bypass surgery, status post ICD implantation. In the interim, he has been stable. He denies chest pain, shortness of breath, or syncope. No peripheral edema. He has class II heart failure symptoms. He is considering going on a cruise. Allergies  Allergen Reactions  . Amlodipine Besylate     REACTION: Swelling  . Warfarin Sodium     REACTION: Stomach bleeding     Current Outpatient Prescriptions  Medication Sig Dispense Refill  . calcium carbonate (OS-CAL) 600 MG TABS Take 600 mg by mouth daily.        . carvedilol (COREG) 25 MG tablet Take one tablet twice daily with meals for blood pressure  180 tablet  3  . cholecalciferol (VITAMIN D) 1000 UNITS tablet Take 1,000 Units by mouth daily.        Marland Kitchen dipyridamole-aspirin (AGGRENOX) 200-25 MG per 12 hr capsule Take one tablet twice daily to reduce stroke  180 capsule  3  . ipratropium (ATROVENT) 0.06 % nasal spray Use up to 2 squirts 3 times daily to help reduse nasal drainage  45 mL  4  . lisinopril (PRINIVIL,ZESTRIL) 20 MG tablet Take 20 mg by mouth daily.        Marland Kitchen LORazepam (ATIVAN) 0.5 MG tablet Take 0.5 mg by mouth at bedtime.        . simvastatin (ZOCOR) 20 MG tablet Take 20 mg by mouth at bedtime.        . triamterene-hydrochlorothiazide (DYAZIDE) 37.5-25 MG per capsule Take 1 capsule by mouth every morning.         No current facility-administered medications for this visit.     Past Medical History  Diagnosis Date  . Complete AV block   . Chronic systolic heart failure   . Cardiomyopathy, ischemic   . Automatic implantable cardiac defibrillator in situ   . CAD (coronary artery disease) of artery bypass graft   . Prostate cancer     S/P radiation rx  . Osteoporosis     A/P Vertebral compression fx's  . Hypertension   . Dyslipidemia   . TIA (transient ischemic  attack)     H/o  . Lumbago 10/05/2012  . Cervicalgia 04/06/2012  . 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 Unspecified vitamin D deficiency 10/07/2011  . Unspecified pruritic disorder 10/07/2011  . Rash and other nonspecific skin eruption 10/07/2011  . Basal cell carcinoma of skin of lower limb, including hip 04/08/2011  . Squamous cell carcinoma of skin of lower limb, including hip 04/08/2011  . Unspecified sinusitis (chronic) 10/08/2010  . Impotence of organic origin 10/08/2010  . Seborrhea 04/09/2010  . Insomnia, unspecified 04/09/2010  . Altered mental status 01/12/2010  . Full incontinence of feces 10/09/2009  . Atrial fibrillation 05/15/2006  . Unspecified urinary incontinence 05/15/2006  . Sebaceous cyst 10/09/2005  . Pathologic fracture of vertebrae 05/16/1991    ROS:   All systems reviewed and negative except as noted in the HPI.   Past Surgical History  Procedure Laterality Date  . Coronary artery bypass graft    . Hernia repair    . Kyphosis surgery    . Biv-icd      S/P  . Cardiac defibrillator placement    . Cyst excision  10/2012    back Dr. Karlyn Agee  . Cyst removal neck  12/2012  basel cell (right) Dr. Irene Limbo     Family History  Problem Relation Age of Onset  . Coronary artery disease Neg Hx   . Heart disease Mother      History   Social History  . Marital Status: Married    Spouse Name: N/A    Number of Children: N/A  . Years of Education: N/A   Occupational History  . Retired Art gallery manager    Social History Main Topics  . Smoking status: Former Smoker    Quit date: 01/13/1962  . Smokeless tobacco: Never Used  . Alcohol Use: 1.2 oz/week    1 Glasses of wine, 1 Cans of beer per week  . Drug Use: No  . Sexually Active: Not on file   Other Topics Concern  . Not on file   Social History Narrative   Married     BP 149/82  Pulse 63  Ht 5\' 10"  (1.778 m)  Wt 153 lb (69.4 kg)  BMI 21.95 kg/m2  Physical Exam:  Well appearing elderly man,  NAD HEENT: Unremarkable Neck:  7 cm JVD, no thyromegally Lungs:  Clear with no wheezes, rales, or rhonchi. HEART:  Regular rate rhythm, no murmurs, no rubs, no clicks Abd:  soft, positive bowel sounds, no organomegally, no rebound, no guarding Ext:  2 plus pulses, no edema, no cyanosis, no clubbing Skin:  No rashes no nodules Neuro:  CN II through XII intact, motor grossly intact  EKG - normal sinus rhythm with biventricular pacing.  DEVICE  Normal device function.  See PaceArt for details.   Assess/Plan:

## 2013-03-16 NOTE — Assessment & Plan Note (Addendum)
His ICD fluid index is elevated, but he is not more short of breath, has not had peripheral edema, and has not gained weight.  I've encouraged the patient to maintain a low-sodium diet. He will continue his current medications and undergo watchful waiting. Today we considered adding more diuretic therapy, but after evaluation and physical examination, he appears to be relatively euvolemic.

## 2013-03-16 NOTE — Patient Instructions (Addendum)

## 2013-03-16 NOTE — Assessment & Plan Note (Signed)
His [employer] biventricular ICD is working normally. We'll plan to recheck in several months. 

## 2013-03-17 LAB — ICD DEVICE OBSERVATION
AL AMPLITUDE: 1.3 mv
AL THRESHOLD: 1 V
ATRIAL PACING ICD: 93 pct
BAMS-0003: 70 {beats}/min
DEVICE MODEL ICD: 631860
FVT: 0
PACEART VT: 0
RV LEAD IMPEDENCE ICD: 350 Ohm
RV LEAD THRESHOLD: 1 V
TOT-0009: 0
TOT-0010: 10
TZAT-0001SLOWVT: 1
TZAT-0019SLOWVT: 7.5 V
TZAT-0020SLOWVT: 1 ms
TZON-0005SLOWVT: 6
TZON-0010SLOWVT: 40 ms
TZST-0001SLOWVT: 2
TZST-0001SLOWVT: 4
TZST-0003SLOWVT: 20 J
VF: 0

## 2013-03-19 ENCOUNTER — Telehealth: Payer: Self-pay | Admitting: *Deleted

## 2013-03-19 NOTE — Telephone Encounter (Signed)
Pt requesting to change next Merlin transmission from 06/21/13 to 06/28/13. I made changes in Epic Appt, PaceArt & Arsenio Katz Estella Husk

## 2013-04-05 ENCOUNTER — Non-Acute Institutional Stay: Payer: Medicare Other | Admitting: Internal Medicine

## 2013-04-05 ENCOUNTER — Encounter: Payer: Self-pay | Admitting: Internal Medicine

## 2013-04-05 ENCOUNTER — Telehealth: Payer: Self-pay | Admitting: Internal Medicine

## 2013-04-05 VITALS — BP 110/66 | HR 72 | Ht 70.0 in | Wt 153.0 lb

## 2013-04-05 DIAGNOSIS — M8080XA Other osteoporosis with current pathological fracture, unspecified site, initial encounter for fracture: Secondary | ICD-10-CM | POA: Insufficient documentation

## 2013-04-05 DIAGNOSIS — I25810 Atherosclerosis of coronary artery bypass graft(s) without angina pectoris: Secondary | ICD-10-CM

## 2013-04-05 DIAGNOSIS — I2510 Atherosclerotic heart disease of native coronary artery without angina pectoris: Secondary | ICD-10-CM | POA: Insufficient documentation

## 2013-04-05 DIAGNOSIS — M948X9 Other specified disorders of cartilage, unspecified sites: Secondary | ICD-10-CM

## 2013-04-05 DIAGNOSIS — I10 Essential (primary) hypertension: Secondary | ICD-10-CM

## 2013-04-05 DIAGNOSIS — R32 Unspecified urinary incontinence: Secondary | ICD-10-CM

## 2013-04-05 DIAGNOSIS — Z95810 Presence of automatic (implantable) cardiac defibrillator: Secondary | ICD-10-CM

## 2013-04-05 DIAGNOSIS — M545 Low back pain: Secondary | ICD-10-CM

## 2013-04-05 DIAGNOSIS — E785 Hyperlipidemia, unspecified: Secondary | ICD-10-CM

## 2013-04-05 DIAGNOSIS — R159 Full incontinence of feces: Secondary | ICD-10-CM

## 2013-04-05 DIAGNOSIS — I2589 Other forms of chronic ischemic heart disease: Secondary | ICD-10-CM

## 2013-04-05 NOTE — Telephone Encounter (Signed)
New Prob  Pt said he received a package from [employer].  He wants to know about the ICD support groups.

## 2013-04-05 NOTE — Telephone Encounter (Signed)
Spoke w/pt in regards to next support group meeting. Pt aware will be sent an invitation in mail for next support group meeting.

## 2013-04-05 NOTE — Progress Notes (Signed)
Subjective:    Patient ID: Jacob Rosales, male    DOB: Aug 07, 1928, 79 y.o.   MRN: [id number]  HPI Dr. Ladona Ridgel has seen patient and ICD is working fine. Pt. Denies chest pain or palpitation. Mild DOE and fatigue with exertion.  Using one mg of lorazepam for sleep. It seems to work [employer].  Rhinorrhea is no longer responding well to ipratropium  Dyslipidemia; stable  CAD (coronary artery disease) of artery bypass graft: denies chest pain. Has noted some fatigue when walking up stairs or hills.  Lumbago: chronic and unchanged  Full incontinence of feces: continues to have fecal smearing/ staining of his underwear. Has some episodes of urgency and fecal incontinence after eating.  Unspecified urinary incontinence: occassional  ICD-St.Jude: working well. Placed due to ischemic cardiomyopathy. He says he was told it has at least 4 more years of life.  Hypertension: controlled  Osteopenia(733.99): reviewed prior Bone density reports. They were done every 3 years and remained stable for > 15 years. He wants to know if he should get another bone density.  CARDIOMYOPATHY, ISCHEMIC: stable    Current Outpatient Prescriptions on File Prior to Visit  Medication Sig Dispense Refill  . calcium carbonate (OS-CAL) 600 MG TABS Take 600 mg by mouth daily.        . carvedilol (COREG) 25 MG tablet Take one tablet twice daily with meals for blood pressure  180 tablet  3  . cholecalciferol (VITAMIN D) 1000 UNITS tablet Take 1,000 Units by mouth daily.        Marland Kitchen dipyridamole-aspirin (AGGRENOX) 200-25 MG per 12 hr capsule Take one tablet twice daily to reduce stroke  180 capsule  3  . ipratropium (ATROVENT) 0.06 % nasal spray Use up to 2 squirts 3 times daily to help reduse nasal drainage  45 mL  4  . lisinopril (PRINIVIL,ZESTRIL) 20 MG tablet Take 20 mg by mouth daily.        Marland Kitchen LORazepam (ATIVAN) 0.5 MG tablet Take 0.5 mg by mouth at bedtime.        . simvastatin (ZOCOR) 20 MG tablet Take 20 mg by mouth  at bedtime.        . triamterene-hydrochlorothiazide (DYAZIDE) 37.5-25 MG per capsule Take 1 capsule by mouth every morning.         No current facility-administered medications on file prior to visit.    Review of Systems  Constitutional: Negative for fever, activity change, appetite change, fatigue and unexpected weight change.  HENT: Positive for rhinorrhea. Negative for ear pain, facial swelling, neck pain and sinus pressure.   Eyes: Negative for photophobia, pain, discharge, redness and itching.  Cardiovascular: Negative for chest pain, palpitations and leg swelling.       PAF.AICD.  Gastrointestinal: Negative for nausea, abdominal pain, abdominal distention and rectal pain.       HX. Fecal incontinence. Brown staining when he wipes rectum. Hasa postprandial fecal urgency.  Endocrine: Negative.   Genitourinary:       Hx urinary incontinence. Leakage at night.  Musculoskeletal: Positive for myalgias and back pain.  Neurological: Negative.   Hematological: Negative.   Psychiatric/Behavioral: Negative.        Objective:BP 110/66  Pulse 72  Ht 5\' 10"  (1.778 m)  Wt 153 lb (69.4 kg)  BMI 21.95 kg/m2    Physical Exam  Constitutional:  Frail elderly male.  HENT:  Right Ear: External ear normal.  Left Ear: External ear normal.  Nose: Nose normal.  Eyes: Conjunctivae and EOM  are normal. Left eye exhibits no discharge.  Neck: Normal range of motion. Neck supple. No JVD present. No tracheal deviation present. No thyromegaly present.  Cardiovascular: Normal rate and regular rhythm.  Exam reveals no gallop and no friction rub.   No murmur heard. Pulmonary/Chest: Effort normal. No respiratory distress. He has no wheezes. He has no rales.  Abdominal: Soft. Bowel sounds are normal. He exhibits no distension and no mass. There is no tenderness.  Musculoskeletal: Normal range of motion. He exhibits no edema and no tenderness.  Lymphadenopathy:    He has no cervical adenopathy.   Neurological: He is alert. He has normal reflexes. No cranial nerve deficit.  Skin: Skin is warm and dry. Rash noted.  Psychiatric: He has a normal mood and affect. His behavior is normal. Judgment and thought content normal.    LABS REVIEWED PSA 0.85      Assessment & Plan:  Dyslipidemia: controlled  CAD (coronary artery disease) of artery bypass graft: stable and asymptomatic  Lumbago; chronic and unchanged  Full incontinence of feces: refer to GI when he desires. He believes this is related to prior radiation therapy.  Unspecified urinary incontinence: refer to urology if he desires  ICD-[employer].Jude: followed by Dr. Ladona Ridgel  Hypertension: controlled  Osteopenia(733.99): He has vertebral fractures and a kyphoplasty in the past. Bone density values fall in the osteopenia range in the past. He is not eager to get involved wwith injections of Forteo, Prolia, or other medications. He is 7 with significant heart disease. I do not think repeating a bone density is likely to change anything for him, even if he has transitioned to osteoporosis range. He is already a candidate for advanced therapy, but does not want to do it.  CARDIOMYOPATHY, ISCHEMIC: stable

## 2013-04-13 ENCOUNTER — Encounter: Payer: Self-pay | Admitting: Internal Medicine

## 2013-05-03 ENCOUNTER — Encounter (HOSPITAL_COMMUNITY): Payer: Self-pay | Admitting: *Deleted

## 2013-05-03 ENCOUNTER — Emergency Department (HOSPITAL_COMMUNITY): Payer: Medicare Other

## 2013-05-03 ENCOUNTER — Observation Stay (HOSPITAL_COMMUNITY)
Admission: EM | Admit: 2013-05-03 | Discharge: 2013-05-04 | Disposition: A | Discharge status: Home / Self Care | Payer: Medicare Other | Attending: Neurosurgery | Admitting: Neurosurgery

## 2013-05-03 DIAGNOSIS — R269 Unspecified abnormalities of gait and mobility: Secondary | ICD-10-CM | POA: Insufficient documentation

## 2013-05-03 DIAGNOSIS — R262 Difficulty in walking, not elsewhere classified: Secondary | ICD-10-CM | POA: Insufficient documentation

## 2013-05-03 DIAGNOSIS — Y92009 Unspecified place in unspecified non-institutional (private) residence as the place of occurrence of the external cause: Secondary | ICD-10-CM | POA: Insufficient documentation

## 2013-05-03 DIAGNOSIS — S12100A Unspecified displaced fracture of second cervical vertebra, initial encounter for closed fracture: Principal | ICD-10-CM | POA: Insufficient documentation

## 2013-05-03 DIAGNOSIS — W19XXXA Unspecified fall, initial encounter: Secondary | ICD-10-CM | POA: Insufficient documentation

## 2013-05-03 DIAGNOSIS — M899 Disorder of bone, unspecified: Secondary | ICD-10-CM | POA: Insufficient documentation

## 2013-05-03 DIAGNOSIS — I10 Essential (primary) hypertension: Secondary | ICD-10-CM | POA: Insufficient documentation

## 2013-05-03 DIAGNOSIS — IMO0002 Reserved for concepts with insufficient information to code with codable children: Secondary | ICD-10-CM | POA: Insufficient documentation

## 2013-05-03 LAB — POCT I-STAT, CHEM 8
Creatinine, Ser: 1.4 mg/dL — ABNORMAL HIGH (ref 0.50–1.35)
Hemoglobin: 14.3 g/dL (ref 13.0–17.0)
Potassium: 4.7 mEq/L (ref 3.5–5.1)
Sodium: 132 mEq/L — ABNORMAL LOW (ref 135–145)
TCO2: 26 mmol/L (ref 0–100)

## 2013-05-03 MED ORDER — ASPIRIN-DIPYRIDAMOLE ER 25-200 MG PO CP12
1.0000 | ORAL_CAPSULE | Freq: Two times a day (BID) | ORAL | Status: DC
  Administered 2013-05-03: 1 via ORAL
  Filled 2013-05-03 (×4): qty 1

## 2013-05-03 MED ORDER — ACETAMINOPHEN 650 MG RE SUPP: 650.0000 mg | Freq: Four times a day (QID) | RECTAL | Status: DC | PRN

## 2013-05-03 MED ORDER — BACITRACIN-NEOMYCIN-POLYMYXIN 400-5-5000 EX OINT
1.0000 "application " | TOPICAL_OINTMENT | Freq: Every day | CUTANEOUS | Status: DC | PRN
  Filled 2013-05-03: qty 1

## 2013-05-03 MED ORDER — ACETAMINOPHEN 325 MG PO TABS: 650.0000 mg | ORAL_TABLET | Freq: Four times a day (QID) | ORAL | Status: DC | PRN

## 2013-05-03 MED ORDER — VITAMIN D3 25 MCG (1000 UNIT) PO TABS
1000.0000 [IU] | ORAL_TABLET | Freq: Every day | ORAL | Status: DC
  Administered 2013-05-04: 1000 [IU] via ORAL
  Filled 2013-05-03: qty 1

## 2013-05-03 MED ORDER — SIMVASTATIN 20 MG PO TABS
20.0000 mg | ORAL_TABLET | Freq: Every day | ORAL | Status: DC
  Administered 2013-05-03: 20 mg via ORAL
  Filled 2013-05-03 (×2): qty 1

## 2013-05-03 MED ORDER — HYDROMORPHONE HCL PF 1 MG/ML IJ SOLN
0.5000 mg | INTRAMUSCULAR | Status: DC | PRN
  Administered 2013-05-03: 0.5 mg via INTRAVENOUS
  Filled 2013-05-03: qty 1

## 2013-05-03 MED ORDER — IPRATROPIUM BROMIDE 0.06 % NA SOLN
2.0000 | Freq: Four times a day (QID) | NASAL | Status: DC
  Filled 2013-05-03 (×2): qty 15

## 2013-05-03 MED ORDER — HYDROCODONE-ACETAMINOPHEN 5-325 MG PO TABS
1.0000 | ORAL_TABLET | ORAL | Status: DC | PRN
  Administered 2013-05-03 – 2013-05-04 (×3): 2 via ORAL
  Filled 2013-05-03 (×3): qty 2

## 2013-05-03 MED ORDER — TRIAMTERENE-HCTZ 37.5-25 MG PO CAPS
1.0000 | ORAL_CAPSULE | ORAL | Status: DC
  Administered 2013-05-04: 1 via ORAL
  Filled 2013-05-03 (×2): qty 1

## 2013-05-03 MED ORDER — LORAZEPAM 1 MG PO TABS
1.0000 mg | ORAL_TABLET | Freq: Every evening | ORAL | Status: DC | PRN
  Administered 2013-05-03: 1 mg via ORAL
  Filled 2013-05-03: qty 1

## 2013-05-03 MED ORDER — CARVEDILOL 25 MG PO TABS
25.0000 mg | ORAL_TABLET | Freq: Two times a day (BID) | ORAL | Status: DC
  Administered 2013-05-04: 25 mg via ORAL
  Filled 2013-05-03 (×3): qty 1

## 2013-05-03 MED ORDER — LISINOPRIL 20 MG PO TABS
20.0000 mg | ORAL_TABLET | Freq: Every evening | ORAL | Status: DC
  Administered 2013-05-03: 20 mg via ORAL
  Filled 2013-05-03 (×3): qty 1

## 2013-05-03 NOTE — ED Notes (Signed)
Patient transported to CT 

## 2013-05-03 NOTE — ED Notes (Signed)
Jacob Rosales with [employer] reports no cardiac episode with ICD and has normal function

## 2013-05-03 NOTE — ED Notes (Signed)
MD at bedside. 

## 2013-05-03 NOTE — ED Notes (Signed)
Phlebotomy at bedside.

## 2013-05-03 NOTE — ED Provider Notes (Signed)
CSN: [id number]     Arrival date & time 05/03/13  1909 History   First MD Initiated Contact with Patient 05/03/13 1921     Chief Complaint  Patient presents with  . Fall   (Consider location/radiation/quality/duration/timing/severity/associated sxs/prior Treatment) HPI Comments: 30 y M with multiple co-morbidities including CAD s/p CABG and BiV Pacemaker/Defib here after a mechanical fall at home.  He lost his balance and fell face first into a pile of mulch.  No preceding symptoms including no headache, SOB, lightheadedness, palpitations, weakness, numbness or other symptoms.  His wife washed the abrasions on his forehead and placed antibiotic ointment on them.  He later developed neck pain, which is why he came to the ED.  No paresthesias, weakness, numbness, incontinence.    Patient is a 79 y.o. male presenting with neck injury. The history is provided by the patient.  Neck Injury This is a new problem. The current episode started today. The problem occurs constantly. The problem has been gradually worsening. Pertinent negatives include no abdominal pain, chills, fever, headaches, nausea, numbness or vomiting.    Past Medical History  Diagnosis Date  . Complete AV block   . Chronic systolic heart failure   . Cardiomyopathy, ischemic   . Automatic implantable cardiac defibrillator in situ   . CAD (coronary artery disease) of artery bypass graft   . Prostate cancer     S/P radiation rx  . Osteoporosis     A/P Vertebral compression fx's  . Hypertension   . Dyslipidemia   . TIA (transient ischemic attack)     H/o  . Lumbago 10/05/2012  . Cervicalgia 04/06/2012  . 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 Unspecified vitamin D deficiency 10/07/2011  . Unspecified pruritic disorder 10/07/2011  . Rash and other nonspecific skin eruption 10/07/2011  . Basal cell carcinoma of skin of lower limb, including hip 04/08/2011  . Squamous cell carcinoma of skin of lower limb, including hip  04/08/2011  . Unspecified sinusitis (chronic) 10/08/2010  . Impotence of organic origin 10/08/2010  . Seborrhea 04/09/2010  . Insomnia, unspecified 04/09/2010  . Altered mental status 01/12/2010  . Full incontinence of feces 10/09/2009  . Atrial fibrillation 05/15/2006  . Unspecified urinary incontinence 05/15/2006  . Sebaceous cyst 10/09/2005  . Pathologic fracture of vertebrae 05/16/1991   Past Surgical History  Procedure Laterality Date  . Coronary artery bypass graft    . Hernia repair    . Kyphosis surgery    . Biv-icd      S/P  . Cardiac defibrillator placement    . Cyst excision  10/2012    back Dr. Karlyn Agee  . Cyst removal neck  12/2012    basel cell (right) Dr. Irene Limbo  . Colonoscopy  2006  . Dexa  April 2010   Family History  Problem Relation Age of Onset  . Coronary artery disease Neg Hx   . Heart disease Mother    History  Substance Use Topics  . Smoking status: Former Smoker    Quit date: 01/13/1962  . Smokeless tobacco: Never Used  . Alcohol Use: 1.2 oz/week    1 Glasses of wine, 1 Cans of beer per week    Review of Systems  Constitutional: Negative for fever and chills.  HENT: Negative for hearing loss and ear pain.   Eyes: Negative for photophobia, pain and visual disturbance.  Respiratory: Negative for chest tightness and shortness of breath.   Cardiovascular: Negative for palpitations.  Gastrointestinal:  Negative for nausea, vomiting and abdominal pain.  Endocrine: Negative for polydipsia and polyphagia.  Genitourinary: Negative for dysuria, frequency and flank pain.  Musculoskeletal: Negative for back pain and gait problem.  Neurological: Negative for dizziness, syncope, facial asymmetry, light-headedness, numbness and headaches.  Hematological: Negative for adenopathy. Does not bruise/bleed easily.  All other systems reviewed and are negative.    Allergies  Amlodipine besylate and Warfarin sodium  Home Medications   Current Outpatient Rx  Name  Route   Sig  Dispense  Refill  . calcium carbonate (OS-CAL) 600 MG TABS   Oral   Take 600 mg by mouth daily.           . carvedilol (COREG) 25 MG tablet      Take one tablet twice daily with meals for blood pressure   180 tablet   3   . cholecalciferol (VITAMIN D) 1000 UNITS tablet   Oral   Take 1,000 Units by mouth daily.           Marland Kitchen dipyridamole-aspirin (AGGRENOX) 200-25 MG per 12 hr capsule      Take one tablet twice daily to reduce stroke   180 capsule   3   . ipratropium (ATROVENT) 0.06 % nasal spray      Use up to 2 squirts 3 times daily to help reduse nasal drainage   45 mL   4   . lisinopril (PRINIVIL,ZESTRIL) 20 MG tablet   Oral   Take 20 mg by mouth daily.           Marland Kitchen LORazepam (ATIVAN) 0.5 MG tablet   Oral   Take 0.5 mg by mouth at bedtime.           . simvastatin (ZOCOR) 20 MG tablet   Oral   Take 20 mg by mouth at bedtime.           . triamterene-hydrochlorothiazide (DYAZIDE) 37.5-25 MG per capsule   Oral   Take 1 capsule by mouth every morning.            BP 169/84  Pulse 67  Temp(Src) 97.9 F (36.6 C) (Oral)  Resp 23  SpO2 95% Physical Exam  Vitals reviewed. Constitutional: He is oriented to person, place, and time. He appears well-developed and well-nourished. No distress.  HENT:  Head: Normocephalic.  Right Ear: External ear normal.  Left Ear: External ear normal.  Nose: Nose normal.  Mouth/Throat: Oropharynx is clear and moist. No oropharyngeal exudate.  Abrasions to forehead with antibiotic ointment  Eyes: Conjunctivae and EOM are normal. Pupils are equal, round, and reactive to light.  Neck: Neck supple.  TTP of upper c spine Placed in C collar  Cardiovascular: Normal rate, regular rhythm, normal heart sounds and intact distal pulses.  Exam reveals no gallop and no friction rub.   No murmur heard. Pulmonary/Chest: Effort normal and breath sounds normal. He exhibits no tenderness.  Abdominal: Soft. Bowel sounds are normal. He  exhibits no distension. There is no tenderness.  Musculoskeletal: Normal range of motion. He exhibits no edema and no tenderness.  Neurological: He is alert and oriented to person, place, and time. He has normal strength. No cranial nerve deficit or sensory deficit. Coordination normal.  Skin: Skin is warm and dry.  Psychiatric: He has a normal mood and affect.    ED Course  Procedures (including critical care time) Labs Review Labs Reviewed  POCT I-STAT, CHEM 8 - Abnormal; Notable for the following:    Sodium 132 (*)  BUN 28 (*)    Creatinine, Ser 1.40 (*)    Glucose, Bld 116 (*)    All other components within normal limits   Imaging Review Ct Head Wo Contrast  05/03/2013   CLINICAL DATA:  79 year old male with falls. Loss of balance. Abrasion. Right lateral neck pain.  EXAM: CT HEAD WITHOUT CONTRAST  CT CERVICAL SPINE WITHOUT CONTRAST  TECHNIQUE: Multidetector CT imaging of the head and cervical spine was performed following the standard protocol without intravenous contrast. Multiplanar CT image reconstructions of the cervical spine were also generated.  COMPARISON:  Head CTs 01/13/2010 and earlier.  FINDINGS: CT HEAD FINDINGS  Visualized paranasal sinuses and mastoids are clear. No scalp hematoma. Visualized orbit soft tissues are within normal limits. Calvarium intact.  Stable cerebral volume. Ventricular size and configuration are within normal limits. No midline shift, mass effect, or evidence of intracranial mass lesion. No acute intracranial hemorrhage identified. Patchy in confluent white matter hypodensity not significantly changed. No evidence of cortically based acute infarction identified. No suspicious intracranial vascular hyperdensity.  CT CERVICAL SPINE FINDINGS  Osteopenia. The occipital condyles and C1 ring appear to remain intact.  Comminuted C2 body fracture with bilateral C2 pedicle fractures. Mild compression of the C2 body and slight posterior displacement of the  posterior vertebral body fracture fragment. Small volume epidural hematoma suspected (series 3, image 31). The odontoid, lamina and C2 spinous process components remain intact.  Cervicothoracic junction alignment is within normal limits. Bilateral posterior element alignment is within normal limits. Multilevel advanced cervical disc space loss and endplate degeneration. No other acute cervical fracture identified. Grossly intact visualized upper thoracic levels.  Negative lung apices. Visualized paraspinal soft tissues are within normal limits.  IMPRESSION: CT HEAD IMPRESSION  No acute intracranial abnormality.  CT CERVICAL SPINE IMPRESSION  1. Comminuted C2 vertebral body and bilateral pedicle fracture. Mild displacement of fracture fragments and small volume epidural hematoma at the C2 level.  2. Osteopenia. No other acute cervical spine or skull base fracture identified.  CriticalValue/emergent results were called by telephone at the time of interpretation on 05/03/2013 at 8:42 PMto Dr. Oleh Genin , who verbally acknowledged these results.   Electronically Signed   By: Augusto Gamble   On: 05/03/2013 20:45   Ct Cervical Spine Wo Contrast  05/03/2013   CLINICAL DATA:  79 year old male with falls. Loss of balance. Abrasion. Right lateral neck pain.  EXAM: CT HEAD WITHOUT CONTRAST  CT CERVICAL SPINE WITHOUT CONTRAST  TECHNIQUE: Multidetector CT imaging of the head and cervical spine was performed following the standard protocol without intravenous contrast. Multiplanar CT image reconstructions of the cervical spine were also generated.  COMPARISON:  Head CTs 01/13/2010 and earlier.  FINDINGS: CT HEAD FINDINGS  Visualized paranasal sinuses and mastoids are clear. No scalp hematoma. Visualized orbit soft tissues are within normal limits. Calvarium intact.  Stable cerebral volume. Ventricular size and configuration are within normal limits. No midline shift, mass effect, or evidence of intracranial mass lesion. No acute  intracranial hemorrhage identified. Patchy in confluent white matter hypodensity not significantly changed. No evidence of cortically based acute infarction identified. No suspicious intracranial vascular hyperdensity.  CT CERVICAL SPINE FINDINGS  Osteopenia. The occipital condyles and C1 ring appear to remain intact.  Comminuted C2 body fracture with bilateral C2 pedicle fractures. Mild compression of the C2 body and slight posterior displacement of the posterior vertebral body fracture fragment. Small volume epidural hematoma suspected (series 3, image 31). The odontoid, lamina and C2 spinous process  components remain intact.  Cervicothoracic junction alignment is within normal limits. Bilateral posterior element alignment is within normal limits. Multilevel advanced cervical disc space loss and endplate degeneration. No other acute cervical fracture identified. Grossly intact visualized upper thoracic levels.  Negative lung apices. Visualized paraspinal soft tissues are within normal limits.  IMPRESSION: CT HEAD IMPRESSION  No acute intracranial abnormality.  CT CERVICAL SPINE IMPRESSION  1. Comminuted C2 vertebral body and bilateral pedicle fracture. Mild displacement of fracture fragments and small volume epidural hematoma at the C2 level.  2. Osteopenia. No other acute cervical spine or skull base fracture identified.  CriticalValue/emergent results were called by telephone at the time of interpretation on 05/03/2013 at 8:42 PMto Dr. Oleh Genin , who verbally acknowledged these results.   Electronically Signed   By: Augusto Gamble   On: 05/03/2013 20:45    Date: 05/03/2013  Rate: 66  Rhythm: paced rhythm  QRS Axis: 182  Intervals: normal  ST/T Wave abnormalities: normal  Conduction Disutrbances:none and right bundle branch block  Narrative Interpretation: Dual paced AV rhythm, RBBB no STD/STD/TWAs  Old EKG Reviewed: unchanged from EKG 03/16/13    MDM   63 y M with multiple co-morbidities including  CAD s/p CABG and BiV Pacemaker/Defib here after a mechanical fall at home.  He lost his balance and fell face first into a pile of mulch.  No preceding symptoms including no headache, SOB, lightheadedness, palpitations, weakness, numbness or other symptoms.  No LOC or HA. His wife washed the abrasions on his forehead and placed antibiotic ointment on them.  He later developed neck pain, which is why he came to the ED.  No paresthesias, weakness, numbness, incontinence.  He was placed in a C collar immediately upon my arrival at the bedside.  TTP of upper C spine.  Normal gross neuro exam with 5/5 strength.  Abrasions of forehead.  10:01 PM Imaging demonstrated a comminuted C2 collar.  Labs unremarkable.  EKG unchanged.  No events on PM interrogation.  NSU to admit.  Pt has been placed in an Aspen collar.  Neuro exam unchanged.    Clinical Impression: 1. C2 cervical fracture, closed, initial encounter   2. Fall at home, initial encounter    Disposition: Admit  Condition: Fair   I have discussed the results, Dx and Tx plan. They understand and agree with plan for admission.  Exam unchanged at admission.   Pt seen in conjunction with Dr. Manus Gunning.  Reine Just. Beverely Pace, MD Emergency Medicine PGY-III [phone]     Oleh Genin, MD 05/04/13 [phone]

## 2013-05-03 NOTE — ED Notes (Signed)
Patient presents after a fall around 1630 this afternoon. Reports doing some gardening outside; lost his balance and fell face forward into some mulch. Patient resides with wife in an independent living facility. Reports that the nurse came and evaluated him. Denies LOC. Reports being consicious the entire time. Denies CP, SOB, dizziness, lightheadness or headache. Abrasion noted to forehead. No active bleeding noted. Patient on Aggrenox. Has pacemaker. Denies being shocked by device. Complains of posterior neck pain. Neuro intact. Sensation intact bilaterally.  No n/t.   C-collar applied at bedside by MD.

## 2013-05-03 NOTE — ED Notes (Signed)
Charge RN notified to interrogate ICD

## 2013-05-03 NOTE — ED Notes (Signed)
Pt states that for a month he has been having falls, pt states that he fell this time and he landed on his head and denies LOC. Pt states antibiotic ointment applied at [facility] and then he mentioned that his neck hurt so they told him to come here.

## 2013-05-03 NOTE — H&P (Signed)
Jacob Rosales is an 79 y.o. male.   Chief Complaint: C2 fracture HPI: 79 year old male suffered a mechanical fall while walking in his yard today. No loss of consciousness. No history to suggest syncope. Patient complains of neck pain but does not have any symptoms of numbness paresthesias or weakness. Evaluation in the emergency department included CT scan of the cervical spine which demonstrates a coronal fracture of the body of C2 without significant displacement. No other obvious injury. Patient denies any other injury or complaint area  Past Medical History  Diagnosis Date  . Complete AV block   . Chronic systolic heart failure   . Cardiomyopathy, ischemic   . Automatic implantable cardiac defibrillator in situ   . CAD (coronary artery disease) of artery bypass graft   . Prostate cancer     S/P radiation rx  . Osteoporosis     A/P Vertebral compression fx's  . Hypertension   . Dyslipidemia   . TIA (transient ischemic attack)     H/o  . Lumbago 10/05/2012  . Cervicalgia 04/06/2012  . 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 Unspecified vitamin D deficiency 10/07/2011  . Unspecified pruritic disorder 10/07/2011  . Rash and other nonspecific skin eruption 10/07/2011  . Basal cell carcinoma of skin of lower limb, including hip 04/08/2011  . Squamous cell carcinoma of skin of lower limb, including hip 04/08/2011  . Unspecified sinusitis (chronic) 10/08/2010  . Impotence of organic origin 10/08/2010  . Seborrhea 04/09/2010  . Insomnia, unspecified 04/09/2010  . Altered mental status 01/12/2010  . Full incontinence of feces 10/09/2009  . Atrial fibrillation 05/15/2006  . Unspecified urinary incontinence 05/15/2006  . Sebaceous cyst 10/09/2005  . Pathologic fracture of vertebrae 05/16/1991    Past Surgical History  Procedure Laterality Date  . Coronary artery bypass graft    . Hernia repair    . Kyphosis surgery    . Biv-icd  11/21/2010    [employer] ICD Model#CD3231-40  [phone]  . Cardiac defibrillator placement    . Cyst excision  10/2012    back Dr. Karlyn Agee  . Cyst removal neck  12/2012    basel cell (right) Dr. Irene Limbo  . Colonoscopy  2006  . Dexa  April 2010    Family History  Problem Relation Age of Onset  . Coronary artery disease Neg Hx   . Heart disease Mother    Social History:  reports that he quit smoking about 51 years ago. He has never used smokeless tobacco. He reports that he drinks about 1.2 ounces of alcohol per week. He reports that he does not use illicit drugs.  Allergies:  Allergies  Allergen Reactions  . Warfarin Sodium Other (See Comments)    REACTION: Stomach bleeding  . Amlodipine Besylate Swelling     (Not in a hospital admission)  Results for orders placed during the hospital encounter of 05/03/13 (from the past 48 hour(s))  POCT I-STAT, CHEM 8     Status: Abnormal   Collection Time    05/03/13  7:56 PM      Result Value Range   Sodium 132 (*) 135 - 145 mEq/L   Potassium 4.7  3.5 - 5.1 mEq/L   Chloride 97  96 - 112 mEq/L   BUN 28 (*) 6 - 23 mg/dL   Creatinine, Ser 8.11 (*) 0.50 - 1.35 mg/dL   Glucose, Bld 914 (*) 70 - 99 mg/dL   Calcium, Ion 7.82  1.13 -  1.30 mmol/L   TCO2 26  0 - 100 mmol/L   Hemoglobin 14.3  13.0 - 17.0 g/dL   HCT 16.1  09.6 - 04.5 %   Ct Head Wo Contrast  05/03/2013   CLINICAL DATA:  79 year old male with falls. Loss of balance. Abrasion. Right lateral neck pain.  EXAM: CT HEAD WITHOUT CONTRAST  CT CERVICAL SPINE WITHOUT CONTRAST  TECHNIQUE: Multidetector CT imaging of the head and cervical spine was performed following the standard protocol without intravenous contrast. Multiplanar CT image reconstructions of the cervical spine were also generated.  COMPARISON:  Head CTs 01/13/2010 and earlier.  FINDINGS: CT HEAD FINDINGS  Visualized paranasal sinuses and mastoids are clear. No scalp hematoma. Visualized orbit soft tissues are within normal limits. Calvarium intact.  Stable cerebral  volume. Ventricular size and configuration are within normal limits. No midline shift, mass effect, or evidence of intracranial mass lesion. No acute intracranial hemorrhage identified. Patchy in confluent white matter hypodensity not significantly changed. No evidence of cortically based acute infarction identified. No suspicious intracranial vascular hyperdensity.  CT CERVICAL SPINE FINDINGS  Osteopenia. The occipital condyles and C1 ring appear to remain intact.  Comminuted C2 body fracture with bilateral C2 pedicle fractures. Mild compression of the C2 body and slight posterior displacement of the posterior vertebral body fracture fragment. Small volume epidural hematoma suspected (series 3, image 31). The odontoid, lamina and C2 spinous process components remain intact.  Cervicothoracic junction alignment is within normal limits. Bilateral posterior element alignment is within normal limits. Multilevel advanced cervical disc space loss and endplate degeneration. No other acute cervical fracture identified. Grossly intact visualized upper thoracic levels.  Negative lung apices. Visualized paraspinal soft tissues are within normal limits.  IMPRESSION: CT HEAD IMPRESSION  No acute intracranial abnormality.  CT CERVICAL SPINE IMPRESSION  1. Comminuted C2 vertebral body and bilateral pedicle fracture. Mild displacement of fracture fragments and small volume epidural hematoma at the C2 level.  2. Osteopenia. No other acute cervical spine or skull base fracture identified.  CriticalValue/emergent results were called by telephone at the time of interpretation on 05/03/2013 at 8:42 PMto Dr. Oleh Genin , who verbally acknowledged these results.   Electronically Signed   By: Augusto Gamble   On: 05/03/2013 20:45   Ct Cervical Spine Wo Contrast  05/03/2013   CLINICAL DATA:  79 year old male with falls. Loss of balance. Abrasion. Right lateral neck pain.  EXAM: CT HEAD WITHOUT CONTRAST  CT CERVICAL SPINE WITHOUT CONTRAST   TECHNIQUE: Multidetector CT imaging of the head and cervical spine was performed following the standard protocol without intravenous contrast. Multiplanar CT image reconstructions of the cervical spine were also generated.  COMPARISON:  Head CTs 01/13/2010 and earlier.  FINDINGS: CT HEAD FINDINGS  Visualized paranasal sinuses and mastoids are clear. No scalp hematoma. Visualized orbit soft tissues are within normal limits. Calvarium intact.  Stable cerebral volume. Ventricular size and configuration are within normal limits. No midline shift, mass effect, or evidence of intracranial mass lesion. No acute intracranial hemorrhage identified. Patchy in confluent white matter hypodensity not significantly changed. No evidence of cortically based acute infarction identified. No suspicious intracranial vascular hyperdensity.  CT CERVICAL SPINE FINDINGS  Osteopenia. The occipital condyles and C1 ring appear to remain intact.  Comminuted C2 body fracture with bilateral C2 pedicle fractures. Mild compression of the C2 body and slight posterior displacement of the posterior vertebral body fracture fragment. Small volume epidural hematoma suspected (series 3, image 31). The odontoid, lamina and C2 spinous  process components remain intact.  Cervicothoracic junction alignment is within normal limits. Bilateral posterior element alignment is within normal limits. Multilevel advanced cervical disc space loss and endplate degeneration. No other acute cervical fracture identified. Grossly intact visualized upper thoracic levels.  Negative lung apices. Visualized paraspinal soft tissues are within normal limits.  IMPRESSION: CT HEAD IMPRESSION  No acute intracranial abnormality.  CT CERVICAL SPINE IMPRESSION  1. Comminuted C2 vertebral body and bilateral pedicle fracture. Mild displacement of fracture fragments and small volume epidural hematoma at the C2 level.  2. Osteopenia. No other acute cervical spine or skull base fracture  identified.  CriticalValue/emergent results were called by telephone at the time of interpretation on 05/03/2013 at 8:42 PMto Dr. Oleh Genin , who verbally acknowledged these results.   Electronically Signed   By: Augusto Gamble   On: 05/03/2013 20:45    A comprehensive review of systems was negative.  Blood pressure 169/84, pulse 67, temperature 97.9 F (36.6 C), temperature source Oral, resp. rate 23, SpO2 95.00%.  He is awake and alert. He is oriented and appropriate. Speech is fluent. Cranial nerve function is intact. Motor and sensory function of the extremities is intact. Reflexes are normal. Examination had demonstrates abrasion to his 4 head without laceration. There is no bony abnormality. Oropharynx nasopharynx and external auditory canals are normal. Neck is mildly tender in the upper cervical spine. There is no obvious bony abnormality. Airways midline. Carotid pulses are normal bilaterally. Chest is clear to auscultation. Rib cage is nontender. Abdomen benign. Extremities free from injury deformity. Assessment/Plan C2 fracture without spinal cord injury. Plan immobilization in collar. Mobilize with physical therapy. Repeat upright film tomorrow. If stable within plan discharge home  Cintya Daughety A 05/03/2013, 9:45 PM

## 2013-05-03 NOTE — ED Notes (Signed)
Patient given diet coke and [place] sandwich bag. Tolerating well.

## 2013-05-03 NOTE — ED Notes (Signed)
Dr. Dutch Quint, neurosurgery, at bedside. He has removed C-collar and placed patient in [employer]

## 2013-05-04 ENCOUNTER — Observation Stay (HOSPITAL_COMMUNITY): Payer: Medicare Other

## 2013-05-04 NOTE — ED Provider Notes (Signed)
I saw and evaluated the patient, reviewed the resident's note and I agree with the findings and plan. If applicable, I agree with the resident's interpretation of the EKG.  If applicable, I was present for critical portions of any procedures performed.  Mechanical fall with forehead abrasion. No chest pain or SOB.  C/o head and neck pain. CN 2-12 intact, no ataxia on finger to nose, no nystagmus, 5/5 strength throughout, no pronator drift, Romberg negative, normal gait. C2 fracture d/w neurosurgery.  Glynn Octave, MD 05/04/13 [phone]

## 2013-05-04 NOTE — Care Management Note (Signed)
    Page 1 of 2   05/04/2013     4:48:47 PM   CARE MANAGEMENT NOTE 05/04/2013  Patient:  AVIEL, DAVALOS   Account Number:  [id number]  Date Initiated:  05/04/2013  Documentation initiated by:  Elmer Bales  Subjective/Objective Assessment:   Patient admitted for cervical fracture     Action/Plan:   Will follow for discharge needs   Anticipated DC Date:  05/04/2013   Anticipated DC Plan:  HOME W HOME HEALTH SERVICES      DC Planning Services  CM consult      Choice offered to / List presented to:  C-1 Patient   DME arranged  Levan Hurst      DME agency  [facility]     HH arranged  HH-3 OT  HH-2 PT      HH agency  OTHER - SEE NOTE   Status of service:  Completed, signed off Medicare Important Message given?   (If response is "NO", the following Medicare IM given date fields will be blank) Date Medicare IM given:   Date Additional Medicare IM given:    Discharge Disposition:  HOME W HOME HEALTH SERVICES  Per UR Regulation:  Reviewed for med. necessity/level of care/duration of stay  If discussed at Long Length of Stay Meetings, dates discussed:    Comments:  05/04/13 1640 Elmer Bales RN, MSN, CM- Met with patient and wife, who are residents at [facility].  Pt states that PT/OT services are provided through [facility].  CM contacted [facility] PT department at [phone] and spoke with Brittani.  Information was faxed to [phone].

## 2013-05-04 NOTE — Progress Notes (Signed)
Orthopedic Tech Progress Note Patient Details:  Jacob Rosales 07/07/1928 [id number]  Ortho Devices Type of Ortho Device: [place] cervical collar Ortho Device/Splint Interventions: Casandra Doffing 05/04/2013, 6:01 PM

## 2013-05-04 NOTE — Evaluation (Signed)
Physical Therapy Evaluation Patient Details Name: Jacob Rosales MRN: [id number] DOB: [birth year] Today's Date: 05/04/2013 Time: 1308-6578 PT Time Calculation (min): 19 min  PT Assessment / Plan / Recommendation History of Present Illness  79 year old male suffered a mechanical fall while walking in his yard today. No loss of consciousness. No history to suggest syncope. Patient complains of neck pain but does not have any symptoms of numbness paresthesias or weakness. Evaluation in the emergency department included CT scan of the cervical spine which demonstrates a coronal fracture of the body of C2 without significant displacement. No other obvious injury. Patient denies any other injury or complaint area  Clinical Impression  Pt admitted from [facility]F after fall sustaining C2 fx. Pt currently with functional limitations due to the deficits listed below (see PT Problem List).  Pt will benefit from skilled PT to increase their independence and safety with mobility to allow discharge to the venue listed below.  Pt min/guard with mobility during evaluation and reviewed cervical precautions during mobility as well.  Pt would like RW for use at home upon d/c for safety.     PT Assessment  Patient needs continued PT services    Follow Up Recommendations  Home health PT    Does the patient have the potential to tolerate intense rehabilitation      Barriers to Discharge        Equipment Recommendations  Rolling walker with 5" wheels    Recommendations for Other Services     Frequency Min 4X/week    Precautions / Restrictions Precautions Precautions: Fall;Cervical Precaution Comments: Pt was reviewing neck precautions when PT entered room Required Braces or Orthoses: Cervical Brace ([place] collar) Cervical Brace: Hard collar;At all times   Pertinent Vitals/Pain C/o minimal neck pain during mobility, RN notified      Mobility  Bed Mobility Bed Mobility: Not assessed Rolling Right: 4: Min  assist Right Sidelying to Sit: 3: Mod assist;HOB flat Sit to Sidelying Right: 4: Min assist Details for Bed Mobility Assistance: vc for bed mobility Transfers Transfers: Sit to Stand;Stand to Sit Sit to Stand: 4: Min guard;From chair/3-in-1;With armrests Stand to Sit: 4: Min guard;To chair/3-in-1;With armrests Details for Transfer Assistance: Pt required verbal cues to foward weight shift and decrease hip extension with sit to stand. (transfer may have been limited by posterior pelvic tilt) Ambulation/Gait Ambulation/Gait Assistance: 4: Min guard Ambulation Distance (Feet): 150 Feet Assistive device: Rolling walker Ambulation/Gait Assistance Details: Pt required verbal cues to negotiate obstacles and not to look down during ambulation. Gait Pattern: Step-through pattern;Decreased stride length;Decreased dorsiflexion - right;Decreased dorsiflexion - left Gait velocity: Decreased gait velocity. General Gait Details: Pt ambulates in a guarded manner secondary to cervical ROM limitations and pain.  Pt has posterior pelvic tilt at rest and during ambulation    Exercises     PT Diagnosis: Acute pain;Difficulty walking  PT Problem List: Decreased mobility;Decreased strength;Decreased range of motion;Decreased activity tolerance;Decreased knowledge of precautions PT Treatment Interventions: DME instruction;Gait training;Functional mobility training;Therapeutic activities;Therapeutic exercise;Patient/family education     PT Goals(Current goals can be found in the care plan section) Acute Rehab PT Goals Patient Stated Goal: go home PT Goal Formulation: With patient Time For Goal Achievement: 05/11/13 Potential to Achieve Goals: Good  Visit Information  Last PT Received On: 05/04/13 Assistance Needed: +1 History of Present Illness: 79 year old male suffered a mechanical fall while walking in his yard today. No loss of consciousness. No history to suggest syncope. Patient complains of neck  pain but  does not have any symptoms of numbness paresthesias or weakness. Evaluation in the emergency department included CT scan of the cervical spine which demonstrates a coronal fracture of the body of C2 without significant displacement. No other obvious injury. Patient denies any other injury or complaint area       Prior Functioning  Home Living Family/patient expects to be discharged to:: Private residence Living Arrangements: Spouse/significant other Available Help at Discharge: Family;Available 24 hours/day Type of Home: Independent living facility Home Access: Level entry Home Layout: One level Home Equipment: None Additional Comments: Pt would benefit from RW in order to maximize safety during funtional mobililty. Prior Function Level of Independence: Independent Comments: very active. walks to dining hall from [employer] Communication: No difficulties Dominant Hand: Right    Cognition  Cognition Arousal/Alertness: Awake/alert Behavior During Therapy: WFL for tasks assessed/performed Overall Cognitive Status: Within Functional Limits for tasks assessed    Extremity/Trunk Assessment Upper Extremity Assessment Upper Extremity Assessment: Overall WFL for tasks assessed Lower Extremity Assessment Lower Extremity Assessment: Overall WFL for tasks assessed Cervical / Trunk Assessment Cervical / Trunk Assessment: Other exceptions;Kyphotic Cervical / Trunk Exceptions: Observed pt with possible posterior pelvic tilt, however, not palpated.   Balance   End of Session PT - End of Session Equipment Utilized During Treatment: Gait belt;Cervical collar Activity Tolerance: Patient limited by pain;Patient limited by fatigue Patient left: in chair;with call bell/phone within reach Nurse Communication: Patient requests pain meds  GP Functional Assessment Tool Used: clinical judgement Functional Limitation: Mobility: Walking and moving around Mobility: Walking and Moving  Around Current Status ([id number]): At least 1 percent but less than 20 percent impaired, limited or restricted Mobility: Walking and Moving Around Goal Status [phone]): 0 percent impaired, limited or restricted   Jacob Rosales,Jacob Rosales 05/04/2013, 12:53 PM Zenovia Jarred, PT, DPT 05/04/2013 Pager: [phone]

## 2013-05-04 NOTE — Progress Notes (Signed)
Occupational Therapy Treatment Patient Details Name: Jacob Rosales MRN: [id number] DOB: 07/10/1928 Today's Date: 05/04/2013 Time: 7829-5621 OT Time Calculation (min): 24 min  OT Assessment / Plan / Recommendation  History of present illness 79 year old male suffered a mechanical fall while walking in his yard today. No loss of consciousness. No history to suggest syncope. Patient complains of neck pain but does not have any symptoms of numbness paresthesias or weakness. Evaluation in the emergency department included CT scan of the cervical spine which demonstrates a coronal fracture of the body of C2 without significant displacement. No other obvious injury. Patient denies any other injury or complaint area   OT comments  Pt with improved balance after initial session. Feel pt will be safe to D/C home today with HHOT/PT. Pt needs RW and shower collar prior to D/C home.   Follow Up Recommendations  Home health OT    Barriers to Discharge       Equipment Recommendations  None recommended by OT    Recommendations for Other Services    Frequency Min 2X/week   Progress towards OT Goals Progress towards OT goals: Progressing toward goals  Plan Discharge plan remains appropriate    Precautions / Restrictions Precautions Precautions: Fall;Cervical Precaution Comments: pt read precaution sheet Required Braces or Orthoses: Cervical Brace Cervical Brace: Hard collar;At all times   Pertinent Vitals/Pain no apparent distress     ADL   Grooming: Supervision/safety;Set up Where Assessed - Grooming: Supported standing Upper Body Bathing: Minimal assistance Where Assessed - Upper Body Bathing: Unsupported sitting Lower Body Bathing: Minimal assistance Where Assessed - Lower Body Bathing: Supported sit to stand Upper Body Dressing: Moderate assistance Where Assessed - Upper Body Dressing: Unsupported sitting Lower Body Dressing: Moderate assistance Where Assessed - Lower Body Dressing:  Supported sit to Pharmacist, hospital: Minimal assistance Toilet Transfer Method: Other (comment) (ambulating) Acupuncturist: Comfort height toilet Toileting - Clothing Manipulation and Hygiene: Supervision/safety Where Assessed - Toileting Clothing Manipulation and Hygiene: Sit to stand from 3-in-1 or toilet Equipment Used: Rolling walker;Gait belt Transfers/Ambulation Related to ADLs: min A ADL Comments: Pt with improvement wit mobility and ADL second session. Discussed possible need to sleep in recliner given difficulty with bed mobility. discussed easier and safer clothing options. Pt conintues to require vc for safe transfer technique regarding hadn placement.    OT Diagnosis: Generalized weakness;Acute pain  OT Problem List: Decreased strength;Decreased range of motion;Decreased activity tolerance;Impaired balance (sitting and/or standing);Decreased knowledge of use of DME or AE;Decreased knowledge of precautions;Pain OT Treatment Interventions: Self-care/ADL training;Therapeutic exercise;Energy conservation;DME and/or AE instruction;Therapeutic activities;Patient/family education;Balance training   OT Goals(current goals can now be found in the care plan section) Acute Rehab OT Goals Patient Stated Goal: go home OT Goal Formulation: With patient Time For Goal Achievement: 05/18/13 Potential to Achieve Goals: Good ADL Goals Pt Will Perform Lower Body Dressing: with supervision;sit to/from stand Pt Will Transfer to Toilet: with modified independence;ambulating;bedside commode;grab bars Pt Will Perform Toileting - Clothing Manipulation and hygiene: with modified independence;sitting/lateral leans Additional ADL Goal #1: complete bed mobility @ S level following cervical precautions.  Visit Information  Last OT Received On: 05/04/13 Assistance Needed: +1 History of Present Illness: 79 year old male suffered a mechanical fall while walking in his yard today. No loss of  consciousness. No history to suggest syncope. Patient complains of neck pain but does not have any symptoms of numbness paresthesias or weakness. Evaluation in the emergency department included CT scan of the cervical spine which demonstrates  a coronal fracture of the body of C2 without significant displacement. No other obvious injury. Patient denies any other injury or complaint area    Subjective Data      Prior Functioning  Home Living Family/patient expects to be discharged to:: Private residence Living Arrangements: Spouse/significant other Available Help at Discharge: Family;Available 24 hours/day Type of Home: Independent living facility Home Access: Level entry Home Layout: One level Home Equipment: None Additional Comments: Pt would benefit from RW in order to maximize safety during funtional mobililty. Prior Function Level of Independence: Independent Comments: very active. walks to dining hall from [employer] Communication: No difficulties Dominant Hand: Right    Cognition  Cognition Arousal/Alertness: Awake/alert Behavior During Therapy: WFL for tasks assessed/performed Overall Cognitive Status: Within Functional Limits for tasks assessed    Mobility  Bed Mobility Bed Mobility: Not assessed Rolling Right: 4: Min assist Right Sidelying to Sit: 3: Mod assist;HOB flat Sit to Sidelying Right: 4: Min assist Details for Bed Mobility Assistance: vc for bed mobility Transfers Transfers: Sit to Stand;Stand to Sit Sit to Stand: 5: Supervision Stand to Sit: 5: Supervision Details for Transfer Assistance: vc for hand placement; written remeinder placed on RW    Exercises      Balance Balance Balance Assessed: Yes Static Standing Balance Static Standing - Balance Support: Bilateral upper extremity supported;During functional activity Static Standing - Level of Assistance: 3: Mod assist;Other (comment) (strong posterior lean; minguard by end of session)   End  of Session OT - End of Session Equipment Utilized During Treatment: Gait belt;Rolling walker;Cervical collar Activity Tolerance: Patient tolerated treatment well Patient left: in chair;with call bell/phone within reach Nurse Communication: Mobility status  GO Functional Assessment Tool Used: clinical observation Functional Limitation: Self care Self Care Current Status (N8295): At least 1 percent but less than 20 percent impaired, limited or restricted Self Care Goal Status (A2130): At least 1 percent but less than 20 percent impaired, limited or restricted Self Care Discharge Status [phone]): At least 1 percent but less than 20 percent impaired, limited or restricted   Niels Cranshaw,HILLARY 05/04/2013, 1:54 PM [facility], OTR/L  [phone] 05/04/2013

## 2013-05-04 NOTE — Progress Notes (Signed)
Occupational Therapy Evaluation Patient Details Name: Jacob Rosales MRN: [id number] DOB: 1928-01-19 Today's Date: 05/04/2013 Time: 7829-5621 OT Time Calculation (min): 35 min  OT Assessment / Plan / Recommendation History of present illness   79 year old male suffered a mechanical fall while walking in his yard today. No loss of consciousness. No history to suggest syncope. Patient complains of neck pain but does not have any symptoms of numbness paresthesias or weakness. Evaluation in the emergency department included CT scan of the cervical spine which demonstrates a coronal fracture of the body of C2 without significant displacement. No other obvious injury. Patient denies any other injury or complaint area    Clinical Impression   PTA pt lived with wife at Independent living at [facility] and was independent with all ADL and mobility. Pt presents with deficits with ADL and mobility at this time. Strong posterior lean at beginning of session. No knowledge of cervical precautions. At risk for falls. Discussed with PT. Will assess further this am to appropriately assess D/C plan. Pt wants to go home.    OT Assessment  Patient needs continued OT Services    Follow Up Recommendations  Other (comment) (will further assess)    Barriers to Discharge      Equipment Recommendations  3 in 1 bedside comode;Other (comment) (RW)    Recommendations for Other Services    Frequency  Min 2X/week    Precautions / Restrictions Precautions Precautions: Fall;Cervical  Cervical collar adjusted to provide better fit.  Pertinent Vitals/Pain Did not rate. C/o discomfort during bed mobility.    ADL  Eating/Feeding: Modified independent Where Assessed - Eating/Feeding: Chair Grooming: Supervision/safety;Set up Where Assessed - Grooming: Supported standing Upper Body Bathing: Minimal assistance Where Assessed - Upper Body Bathing: Unsupported sitting Lower Body Bathing: Minimal assistance Where  Assessed - Lower Body Bathing: Supported sit to stand Upper Body Dressing: Moderate assistance Where Assessed - Upper Body Dressing: Unsupported sitting Lower Body Dressing: Moderate assistance Where Assessed - Lower Body Dressing: Supported sit to Pharmacist, hospital: Minimal assistance Toilet Transfer Method: Other (comment) (ambulating) Acupuncturist: Comfort height toilet Toileting - Clothing Manipulation and Hygiene: Minimal assistance Where Assessed - Engineer, mining and Hygiene: Sit to stand from 3-in-1 or toilet Equipment Used: Rolling walker;Gait belt Transfers/Ambulation Related to ADLs: min A ADL Comments: given cervical precaution worksheet to review    OT Diagnosis: Generalized weakness;Acute pain  OT Problem List: Decreased strength;Decreased range of motion;Decreased activity tolerance;Impaired balance (sitting and/or standing);Decreased knowledge of use of DME or AE;Decreased knowledge of precautions;Pain OT Treatment Interventions: Self-care/ADL training;Therapeutic exercise;Energy conservation;DME and/or AE instruction;Therapeutic activities;Patient/family education;Balance training   OT Goals(Current goals can be found in the care plan section) Acute Rehab OT Goals Patient Stated Goal: go home OT Goal Formulation: With patient Time For Goal Achievement: 05/18/13 Potential to Achieve Goals: Good  Visit Information  Last OT Received On: 05/04/13 Assistance Needed: +1       Prior Functioning     Home Living Family/patient expects to be discharged to:: Private residence Available Help at Discharge: Family;Available 24 hours/day Type of Home: Independent living facility Home Access: Level entry Home Layout: One level Home Equipment: None Prior Function Level of Independence: Independent Comments: very active. walks to dining hall from [employer] Communication: No difficulties Dominant Hand: Right          Vision/Perception Vision - History Baseline Vision: Wears glasses all the time   Cognition  Cognition Arousal/Alertness: Awake/alert Behavior During Therapy: [facility] for  tasks assessed/performed Overall Cognitive Status: Within Functional Limits for tasks assessed    Extremity/Trunk Assessment Upper Extremity Assessment Upper Extremity Assessment: Overall WFL for tasks assessed Lower Extremity Assessment Lower Extremity Assessment: Overall WFL for tasks assessed Cervical / Trunk Assessment Cervical / Trunk Assessment: Kyphotic     Mobility Bed Mobility Bed Mobility: Rolling Right;Right Sidelying to Sit;Sit to Sidelying Right Rolling Right: 4: Min assist Right Sidelying to Sit: 3: Mod assist;HOB flat Sit to Sidelying Right: 4: Min assist Details for Bed Mobility Assistance: vc for bed mobility Transfers Transfers: Sit to Stand;Stand to Sit Sit to Stand: With upper extremity assist;From bed;3: Mod assist Stand to Sit: 4: Min assist;To bed Details for Transfer Assistance: initially mod A. end of session min A     Exercise     Balance Balance Balance Assessed: Yes Static Standing Balance Static Standing - Balance Support: Bilateral upper extremity supported;During functional activity Static Standing - Level of Assistance: 3: Mod assist;Other (comment) (strong posterior lean; minguard by end of session)   End of Session OT - End of Session Equipment Utilized During Treatment: Gait belt;Rolling walker;Cervical collar Activity Tolerance: Patient tolerated treatment well Patient left: in chair;with call bell/phone within reach Nurse Communication: Mobility status;Precautions  GO Functional Assessment Tool Used: clinical observation Functional Limitation: Self care Self Care Current Status (Z6109): At least 20 percent but less than 40 percent impaired, limited or restricted Self Care Goal Status (U0454): At least 1 percent but less than 20 percent impaired, limited or restricted    [facility] 05/04/2013, 10:23 AM [facility], OTR/L  [phone] 05/04/2013

## 2013-05-05 ENCOUNTER — Encounter: Payer: Self-pay | Admitting: Geriatric Medicine

## 2013-05-05 ENCOUNTER — Non-Acute Institutional Stay: Payer: Medicare Other | Admitting: Geriatric Medicine

## 2013-05-05 DIAGNOSIS — S12100A Unspecified displaced fracture of second cervical vertebra, initial encounter for closed fracture: Secondary | ICD-10-CM

## 2013-05-05 MED ORDER — HYDROCODONE-ACETAMINOPHEN 5-325 MG PO TABS: 1.0000 | ORAL_TABLET | Freq: Three times a day (TID) | ORAL | Status: DC

## 2013-05-05 MED ORDER — SENNOSIDES-DOCUSATE SODIUM 8.6-50 MG PO TABS: 1.0000 | ORAL_TABLET | Freq: Every day | ORAL | Status: DC

## 2013-05-05 NOTE — Progress Notes (Signed)
Patient ID: Jacob Rosales, male   DOB: Mar 09, 1928, 79 y.o.   MRN: [id number] [facility] [id number])  Code Status:  Contact Information   Name Relation Home Work Mobile   Fleming,[name] Spouse [phone]     [name],[name] Daughter [phone]         Chief Complaint  Patient presents with  . Neck Pain    Larey Seat and went to hospital 05/03/2013 and has a C2 cerivcal fracture    HPI: This is a 79 y.o. male resident of [facility], Independent Living  section.  He had a fall 05/03/13 with resultant neck pain. Evaluation in the emergency department included CT scan of the cervical spine which demonstrated a coronal fracture of the body of C2 without significant displacement. Patient was evaluated by Dr. Julio Sicks. Planis  immobilization in collar, mobilize with physical therapy. Repeat cervical spine film on September 2 revealed stable fracture, patient was discharged home, instructed to take Tylenol for pain. Today patient is having severe pain in his neck and his head. Having difficult time with mobility in his home, his spouse is assisting him.  He took one of his spouse's hydrocodone earlier this afternoon, reports some pain relief. After some discussion patient does report that he felt like something "popped" this morning when he tried to get out of bed.    Allergies  Allergen Reactions  . Warfarin Sodium Other (See Comments)    REACTION: Stomach bleeding  . Amlodipine Besylate Swelling   Medications    Medication List       This list is accurate as of: 05/05/13 11:59 PM.  Always use your most recent med list.               CALCIUM 600 + D PO  Take 1 tablet by mouth 2 (two) times daily.     carvedilol 25 MG tablet  Commonly known as:  COREG  Take 25 mg by mouth 2 (two) times daily with a meal.     cholecalciferol 1000 UNITS tablet  Commonly known as:  VITAMIN D  Take 1,000 Units by mouth daily.     dipyridamole-aspirin 200-25 MG per 12 hr  capsule  Commonly known as:  AGGRENOX  Take one tablet twice daily to reduce stroke     HYDROcodone-acetaminophen 5-325 MG per tablet  Commonly known as:  NORCO/VICODIN  Take 1 tablet by mouth 3 (three) times daily.     ipratropium 0.06 % nasal spray  Commonly known as:  ATROVENT  Place 2 sprays into the nose 4 (four) times daily.     lisinopril 20 MG tablet  Commonly known as:  PRINIVIL,ZESTRIL  Take 20 mg by mouth every evening.     LORazepam 1 MG tablet  Commonly known as:  ATIVAN  Take 1 mg by mouth at bedtime as needed (for sleep).     neomycin-bacitracin-polymyxin ointment  Commonly known as:  NEOSPORIN  Apply 1 application topically daily as needed (for head scrapes).     senna-docusate 8.6-50 MG per tablet  Commonly known as:  Senokot-S  Take 1 tablet by mouth daily.     simvastatin 20 MG tablet  Commonly known as:  ZOCOR  Take 20 mg by mouth at bedtime.     triamterene-hydrochlorothiazide 37.5-25 MG per capsule  Commonly known as:  DYAZIDE  Take 1 capsule by mouth every morning.         DATA REVIEWED  Radiologic Exams 05/04/2013:   CT HEAD IMPRESSION:  No acute intracranial abnormality.   CT CERVICAL SPINE IMPRESSION:    1. Comminuted C2 vertebral body and bilateral pedicle fracture. Mild displacement of fracture fragments and small volume epidural hematoma at the C2 level.   2. Osteopenia. No other acute cervical spine or skull base fracture identified.   CriticalValue/emergent results were called by telephone at the time of interpretation on 05/03/2013 at 8:42 PMto Dr. Oleh Genin , who verbally acknowledged these results.   Cardiovascular Exams:   Laboratory Studies  Hosp. Lab 05/03/2013 hemoglobin 14.3, hematocrit 42.0  Glucose 116, BUN 20, creatinine 1.4, sodium 132, potassium 4.7   Review of Systems  DATA OBTAINED: from patient, Spouse GENERAL:    Feeling very tired, did not sleep very well. Difficult to eat/ drink due to neck  brace SKIN: No itch, rash Forehead abrasions EYES: No eye pain, dryness or itching  No change in vision EARS: No earache, change in hearing NOSE: No congestion, drainage or bleeding MOUTH/THROAT: No mouth or tooth pain  No sore throat  Some difficulty chewing/ swallowing due to neck brace RESPIRATORY: No cough, wheezing, SOB CARDIAC: No chest pain, palpitations  No edema. GI: No abdominal pain  No N/V/D or constipation  No heartburn or reflux  GU: No dysuria, frequency or urgency  No change in urine volume or character   MUSCULOSKELETAL: Neck pain, difficulty moving/walking NEUROLOGIC: No dizziness, fainting. Has a  headache, No change in mental status.  PSYCHIATRIC: No feelings of anxiety, depression      Physical Exam Filed Vitals:   05/05/13 1454  BP: 146/88  Pulse: 64    GENERAL APPEARANCE: No acute distress, appropriately groomed, normal body habitus. Less Alert than usual, pleasant, conversant. SKIN: No diaphoresis, rash.  Forehead with large area of superficial abrasion EYES: Conjunctiva/lids clear. Pupils round, reactive. EARS: decreased Hearing  NOSE: No deformity or discharge. MOUTH/THROAT: Lips w/o lesions. Oral mucosa, tongue moist, w/o lesion.   NECK: Brace in place, neck is immobilized RESPIRATORY: Breathing is even, unlabored. Lung sounds are clear and full.  CARDIOVASCULAR: Heart RRR. No murmur or extra heart sounds  EDEMA: No peripheral edema.  GASTROINTESTINAL: Abdomen is soft, non-tender, not distended w/ normal bowel sounds.  MUSCULOSKELETAL: Moves UE extremities with full ROM, strength and tone. Unable to resposition himself in [facility] due to neck pain with any movement NEUROLOGIC: Oriented to time, place, person. Cranial nerves 2-12 grossly intact, speech clear, no tremor.   PSYCHIATRIC: Mood and affect appropriate to situation  ASSESSMENT/PLAN  C2 cervical fracture C2 fracture 2 days ago, discharge from hospital late yesterday. Tylenol is not managing pain,  patient is not able to mobilize independently. Due to patient's report of a popping sound earlier today will repeat 2 view x-ray of the cervical spine to rule out further displacement. Add hydrocodone 5-325 t.i.d. for pain management, Senokot q.h.s. to prevent constipation. This patient will benefit from additional assistance either in the form of Personal Care Services or admission to the Rehabilitation section at [facility].   Time: 40 minutes, >50% spent counseling/ care coordination  Follow up: As needed  Remi Rester T.Javier Mamone, NP-C 05/05/2013

## 2013-05-06 ENCOUNTER — Encounter: Payer: Self-pay | Admitting: Geriatric Medicine

## 2013-05-06 NOTE — Assessment & Plan Note (Addendum)
C2 fracture 2 days ago, discharge from hospital late yesterday. Tylenol is not managing pain, patient is not able to mobilize independently. Due to patient's report of a popping sound earlier today will repeat 2 view x-ray of the cervical spine to rule out further displacement. Add hydrocodone 5-325 t.i.d. for pain management, Senokot q.h.s. to prevent constipation. This patient will benefit from additional assistance either in the form of Personal Care Services or admission to the Rehabilitation section at [facility].

## 2013-05-07 ENCOUNTER — Non-Acute Institutional Stay (SKILLED_NURSING_FACILITY): Payer: Medicare Other | Admitting: Geriatric Medicine

## 2013-05-07 ENCOUNTER — Encounter: Payer: Self-pay | Admitting: Geriatric Medicine

## 2013-05-07 ENCOUNTER — Other Ambulatory Visit: Payer: Self-pay | Admitting: Geriatric Medicine

## 2013-05-07 DIAGNOSIS — S12100D Unspecified displaced fracture of second cervical vertebra, subsequent encounter for fracture with routine healing: Secondary | ICD-10-CM

## 2013-05-07 DIAGNOSIS — Z7189 Other specified counseling: Secondary | ICD-10-CM

## 2013-05-07 DIAGNOSIS — I10 Essential (primary) hypertension: Secondary | ICD-10-CM

## 2013-05-07 DIAGNOSIS — Z66 Do not resuscitate: Secondary | ICD-10-CM

## 2013-05-07 DIAGNOSIS — I4891 Unspecified atrial fibrillation: Secondary | ICD-10-CM

## 2013-05-07 DIAGNOSIS — IMO0002 Reserved for concepts with insufficient information to code with codable children: Secondary | ICD-10-CM

## 2013-05-07 NOTE — Assessment & Plan Note (Signed)
Stable, continue current medication.

## 2013-05-07 NOTE — Assessment & Plan Note (Signed)
Patient had discussion with Dr.Taylor re: Code status, DNR was signed.  Will complete MOST  Form to reflect this decision.

## 2013-05-07 NOTE — Assessment & Plan Note (Signed)
C2 fracture immobilized in collar, unable to ambulate/ perform ADLs independently. Pain management with hydrocodone/acetaminiphen. PT/OT to evaluate/tx for ADLs/ mobilization. Follow up has been scheduled with Dr. Jordan Likes 05/20/13

## 2013-05-07 NOTE — Progress Notes (Signed)
Patient ID: Jacob Rosales, male   DOB: 1928-01-31, 79 y.o.   MRN: [id number] [facility] [phone])  Code Status: DNR     Contact Information   Name Relation Home Work Mobile   Basil,[name] Spouse [phone]     [name],[name] Daughter [phone]         Chief Complaint  Patient presents with  . Vertebral Fracture    C2 fracture    HPI: This 79 y.o. male resident of [facility] was admitted to Rehab section yesterday.  He had a fall 05/03/13 with resultant neck pain. Evaluation in the emergency department included CT scan of the cervical spine which demonstrated a coronal fracture of the body of C2 without significant displacement. Patient was evaluated by Dr. Julio Sicks, treatment plan is neck immobilization in collar, mobilize with physical therapy. Repeat cervical spine film on September 2 revealed stable fracture, patient was discharged home, instructed to take Tylenol for pain. Patient was evaluated in Clinic 9/3 due to severe pain in his neck and his head, having difficult time with mobility in his home, requiring spouse to assist him.  After some discussion patient does report that he felt like something "popped" this morning when he tried to get out of bed.  An X-ray was obtained, no new fracture identified. Patient was RX hydrocodone/acetaminophen for pain management. He continued to have difficulty with ADLs/mobilizing, was admitted to Rehab section when a room became available.    Allergies  Allergen Reactions  . Warfarin Sodium Other (See Comments)    REACTION: Stomach bleeding  . Amlodipine Besylate Swelling   Medications    Medication List       This list is accurate as of: 05/07/13 10:49 AM.  Always use your most recent med list.               CALCIUM 600 + D PO  Take 1 tablet by mouth 2 (two) times daily.     carvedilol 25 MG tablet  Commonly known as:  COREG  Take 25 mg by mouth 2 (two) times  daily with a meal.     cholecalciferol 1000 UNITS tablet  Commonly known as:  VITAMIN D  Take 1,000 Units by mouth daily.     dipyridamole-aspirin 200-25 MG per 12 hr capsule  Commonly known as:  AGGRENOX  Take one tablet twice daily to reduce stroke     HYDROcodone-acetaminophen 5-325 MG per tablet  Commonly known as:  NORCO/VICODIN  Take 1 tablet by mouth 3 (three) times daily.     ipratropium 0.06 % nasal spray  Commonly known as:  ATROVENT  Place 2 sprays into the nose 4 (four) times daily.     lisinopril 20 MG tablet  Commonly known as:  PRINIVIL,ZESTRIL  Take 20 mg by mouth every evening.     LORazepam 1 MG tablet  Commonly known as:  ATIVAN  Take 1 mg by mouth at bedtime as needed (for sleep).     neomycin-bacitracin-polymyxin ointment  Commonly known as:  NEOSPORIN  Apply 1 application topically daily as needed (for head scrapes).     senna-docusate 8.6-50 MG per tablet  Commonly known as:  Senokot-S  Take 1 tablet by mouth daily.     simvastatin 20 MG tablet  Commonly known as:  ZOCOR  Take 20 mg by mouth at bedtime.     triamterene-hydrochlorothiazide 37.5-25 MG per capsule  Commonly known as:  DYAZIDE  Take 1 capsule by  mouth every morning.       DATA REVIEWED  Radiologic Exams 05/04/2013:   CT HEAD IMPRESSION: No acute intracranial abnormality.   CT CERVICAL SPINE IMPRESSION:    1. Comminuted C2 vertebral body and bilateral pedicle fracture. Mild displacement of fracture fragments and small volume epidural hematoma at the C2 level.   2. Osteopenia. No other acute cervical spine or skull base fracture identified.   CriticalValue/emergent results were called by telephone at the time of interpretation on 05/03/2013 at 8:42 PMto Dr. Oleh Genin , who verbally acknowledged these results.     [facility] X-[facility]  05/05/2013 Cervical Spine: Limited views- no acute fracture noted   Cardiovascular Exams:   Laboratory Studies  Hosp.  Lab 05/03/2013 hemoglobin 14.3, hematocrit 42.0  Glucose 116, BUN 20, creatinine 1.4, sodium 132, potassium 4.7   Review of Systems  DATA OBTAINED: from patient, GENERAL:    Feels comfortable. Difficult to eat/ drink due to neck brace SKIN: No itch, rash Forehead abrasions EYES: No eye pain, dryness or itching  No change in vision EARS: No earache, change in hearing NOSE: No congestion, drainage or bleeding MOUTH/THROAT: No mouth or tooth pain  No sore throat  Some difficulty chewing/ swallowing due to neck brace RESPIRATORY: No cough, wheezing, SOB CARDIAC: No chest pain, palpitations  No edema. GI: No abdominal pain  No N/V/D or constipation  No heartburn or reflux  GU: No dysuria, frequency or urgency  No change in urine volume or character   MUSCULOSKELETAL: Neck pain, difficulty moving NEUROLOGIC: No dizziness, fainting, headache  No change in mental status.  PSYCHIATRIC: No feelings of anxiety, depression      Physical Exam Filed Vitals:   05/07/13 1047  BP: 171/87  Pulse: 70  Temp: 97.8 F (36.6 C)  Resp: 15  SpO2: 96%    GENERAL APPEARANCE: No acute distress, appropriately groomed, normal body habitus. Alert, pleasant, conversant. SKIN: No diaphoresis, rash.  Forehead with large area of superficial abrasions EYES: Conjunctiva/lids clear. Pupils round, reactive. EARS: decreased Hearing  NOSE: No deformity or discharge. MOUTH/THROAT: Lips w/o lesions. Oral mucosa, tongue moist, w/o lesion.   NECK: Brace in place, neck is immobilized, pain with any movement RESPIRATORY: Breathing is even, unlabored. Lung sounds are clear and full.  CARDIOVASCULAR: Heart RRR. No murmur or extra heart sounds  EDEMA: No peripheral edema.  GASTROINTESTINAL: Abdomen is soft, non-tender, not distended w/ normal bowel sounds.  MUSCULOSKELETAL: Moves UE extremities with full ROM, strength and tone.  NEUROLOGIC: Oriented to time, place, person. Cranial nerves 2-12 grossly intact, speech  clear, no tremor.   PSYCHIATRIC: Mood and affect appropriate to situation  ASSESSMENT/PLAN  Hypertension Stable , continue current medication  C2 cervical fracture C2 fracture immobilized in collar, unable to ambulate/ perform ADLs independently. Pain management with hydrocodone/acetaminiphen. PT/OT to evaluate/tx for ADLs/ mobilization. Follow up has been scheduled with Dr. Jordan Likes 05/20/13  Advanced care planning/counseling discussion Patient had discussion with Dr.Taylor re: Code status, DNR was signed.  Will complete MOST  Form to reflect this decision.  Atrial fibrillation Heart in regular rhythm today, rate controlled. Continue current medication, including Aggrenox for stroke risk reduction   Follow up: As needed  Luree Palla T.Jaleen Finch, NP-C 05/07/2013

## 2013-05-07 NOTE — Assessment & Plan Note (Signed)
Heart in regular rhythm today, rate controlled. Continue current medication, including Aggrenox for stroke risk reduction

## 2013-05-10 ENCOUNTER — Encounter: Payer: Self-pay | Admitting: Geriatric Medicine

## 2013-05-10 ENCOUNTER — Non-Acute Institutional Stay (SKILLED_NURSING_FACILITY): Payer: Medicare Other | Admitting: Geriatric Medicine

## 2013-05-10 DIAGNOSIS — IMO0002 Reserved for concepts with insufficient information to code with codable children: Secondary | ICD-10-CM

## 2013-05-10 DIAGNOSIS — S12100D Unspecified displaced fracture of second cervical vertebra, subsequent encounter for fracture with routine healing: Secondary | ICD-10-CM

## 2013-05-10 NOTE — Assessment & Plan Note (Signed)
Continues with significant pain not adequately managed with hydrocodone. Will add Robaxin as adjunct medication. Patient is demonstrating some mild adverse effects from hydrocodone with confusion and disorientation. Continue to monitor this. Neck brace readjusted today, nursing staff will monitor position of the brace and readjust as needed. Have recommended patient be up in chair for meals and back in the bed with head of bed elevated to avoid slumping in the chair.  Return visit to Dr. Jordan Likes has been arranged for September 18.

## 2013-05-10 NOTE — Discharge Summary (Signed)
Physician Discharge Summary  Patient ID: Jacob Rosales MRN: [id number] DOB/AGE: 77-Aug-1929 77 y.o.  Admit date: 05/03/2013 Discharge date: 05/10/2013  Admission Diagnoses:  Discharge Diagnoses:  Principal Problem:   C2 cervical fracture   Discharged Condition: good  Hospital Course: Patient admitted to the hospital for treatment and evaluation of a closed posttraumatic C2 fracture. Patient has been fitted for a Aspen collar. He has been mobilized with therapy. Upright x-rays are stable. Patient ready for discharge home. Patient will continue in collar for at least 2 months.  Consults:   Significant Diagnostic Studies:   Treatments:   Discharge Exam: Blood pressure 118/59, pulse 60, temperature 98.3 F (36.8 C), temperature source Oral, resp. rate 16, height 5\' 10"  (1.778 m), weight 68.312 kg (150 lb 9.6 oz), SpO2 95.00%. Awake and alert oriented and appropriate. Cranial nerve function intact. Motor and sensory function in the extremities normal. Chest and abdomen benign. Collar fits well  Disposition: 01-Home or Self Care  Discharge Orders   Future Appointments Provider Department Dept Phone   06/28/2013 8:00 AM [facility] [employer] Main Office [place]) [phone]   10/11/2013 2:30 PM Kimber Relic, MD [facility] [phone]   Future Orders Complete By Expires   Face-to-face encounter (required for Medicare/Medicaid patients)  As directed    Comments:     I Annamarie Yamaguchi A certify that this patient is under my care and that I, or a nurse practitioner or physician's assistant working with me, had a face-to-face encounter that meets the physician face-to-face encounter requirements with this patient on 05/04/2013. The encounter with the patient was in whole, or in part for the following medical condition(s) which is the primary reason for home health care (List medical condition): C2 fracture   Questions:     The encounter with the patient was in  whole, or in part, for the following medical condition, which is the primary reason for home health care:  C2 fracture   I certify that, based on my findings, the following services are medically necessary home health services:  Physical therapy   My clinical findings support the need for the above services:  Unable to leave home safely without assistance and/or assistive device   Further, I certify that my clinical findings support that this patient is homebound due to:  Unable to leave home safely without assistance   Reason for Medically Necessary Home Health Services:  Therapy- Investment banker, operational, Patent examiner   Home Health  As directed    Questions:     To provide the following care/treatments:  PT   OT       Medication List         CALCIUM 600 + D PO  Take 1 tablet by mouth 2 (two) times daily.     carvedilol 25 MG tablet  Commonly known as:  COREG  Take 25 mg by mouth 2 (two) times daily with a meal.     cholecalciferol 1000 UNITS tablet  Commonly known as:  VITAMIN D  Take 1,000 Units by mouth daily.     dipyridamole-aspirin 200-25 MG per 12 hr capsule  Commonly known as:  AGGRENOX  Take one tablet twice daily to reduce stroke     ipratropium 0.06 % nasal spray  Commonly known as:  ATROVENT  Place 2 sprays into the nose 4 (four) times daily.     lisinopril 20 MG tablet  Commonly known as:  PRINIVIL,ZESTRIL  Take 20 mg by  mouth every evening.     LORazepam 1 MG tablet  Commonly known as:  ATIVAN  Take 1 mg by mouth at bedtime as needed (for sleep).     neomycin-bacitracin-polymyxin ointment  Commonly known as:  NEOSPORIN  Apply 1 application topically daily as needed (for head scrapes).     simvastatin 20 MG tablet  Commonly known as:  ZOCOR  Take 20 mg by mouth at bedtime.     triamterene-hydrochlorothiazide 37.5-25 MG per capsule  Commonly known as:  DYAZIDE  Take 1 capsule by mouth every morning.         Signed: Nomi Rudnicki  A 05/10/2013, 12:50 PM

## 2013-05-10 NOTE — Progress Notes (Deleted)
Patient ID: Jacob Rosales, male   DOB: [birth year], 79 y.o.   MRN: [id number]

## 2013-05-10 NOTE — Progress Notes (Signed)
Patient ID: Jacob Rosales, male   DOB: [birth year], 79 y.o.   MRN: [id number] [facility] [phone])  Code Status: DNR     Contact Information   Name Relation Home Work Mobile   Lierman,[name] Spouse [phone]     [name],[name] Daughter [phone]         Chief Complaint  Patient presents with  . C2 fracture    HPI: This 79 y.o. male resident of [facility] was admitted to Rehab section 05/06/2013.  He had a fall 05/03/13 with resultant neck pain. Evaluation in the emergency department included CT scan of the cervical spine which demonstrated a coronal fracture of the body of C2 without significant displacement. Patient was evaluated by Dr. Julio Sicks; treatment plan is neck immobilization in collar, mobilize with physical therapy. Repeat cervical spine film on September 2 revealed stable fracture, patient was discharged home, instructed to take Tylenol for pain.  Patient was evaluated in Clinic 9/3 due to severe pain in his neck and his head, having difficult time with mobility in his home, requiring spouse to assist him.  After some discussion patient does report that he felt like something "popped" this morning when he tried to get out of bed.  An X-ray was obtained, no new fracture identified. Patient was RX hydrocodone/acetaminophen for pain management. He continued to have difficulty with ADLs/mobilizing, was admitted to Rehab section when a room became available.  Patient continues to have significant neck pain, has required p.r.n. dosing of hydrocodone along with scheduled t.i.d. dosing. Both spouse and daughter report that he frequently appears generally uncomfortable and fidgety. They also report some "confusion". Patient reports he just does not feel quite like himself. Daughter is asking whether alternative to alternative pain medication and or muscle relaxant would be useful.  Patient is able to ambulate short distances with  assistance. He is having some difficulty managing eating and drinking do to the neck brace   Allergies  Allergen Reactions  . Warfarin Sodium Other (See Comments)    REACTION: Stomach bleeding  . Amlodipine Besylate Swelling   Medications Reviewed  DATA REVIEWED  Radiologic Exams 05/04/2013:   CT HEAD IMPRESSION: No acute intracranial abnormality.   CT CERVICAL SPINE IMPRESSION:    1. Comminuted C2 vertebral body and bilateral pedicle fracture. Mild displacement of fracture fragments and small volume epidural hematoma at the C2 level.   2. Osteopenia. No other acute cervical spine or skull base fracture identified.       [facility] X-ray  05/05/2013 Cervical Spine: Limited views- no acute fracture noted   Cardiovascular Exams:   Laboratory Studies   [facility] 09/29/2012 glucose 82, BUN 23, creatinine 1.39, sodium 138, potassium 4.4. Protein/LFTs WNL  Total cholesterol 143, triglycerides 72, HDL 52, LDL 77 03/30/2013 PSA .85    Hosp[facility] Lab 05/03/2013 hemoglobin 14.3, hematocrit 42.0  Glucose 116, BUN 20, creatinine 1.4, sodium 132, potassium 4.7   Review of Systems  DATA OBTAINED: from patient, nurse, spouse, daughter GENERAL:    Feels uncomfortable. Difficult to eat/ drink due to neck brace SKIN: No itch, rash Forehead abrasions EYES: No eye pain, dryness or itching  No change in vision EARS: No earache, change in hearing NOSE: No congestion, drainage or bleeding MOUTH/THROAT: No mouth or tooth pain  No sore throat  Some difficulty chewing/ swallowing due to neck brace RESPIRATORY: No cough, wheezing, SOB CARDIAC: No chest pain, palpitations  No edema. GI: No abdominal pain  No  N/V/D or constipation  No heartburn or reflux  GU: No dysuria, urinary frequency present (not new)   No change in urine volume or character   MUSCULOSKELETAL: Neck pain, difficulty moving NEUROLOGIC: No dizziness, fainting, headache See HPI - some confusion reported by family  members PSYCHIATRIC: Mild anxiety  Physical Exam Filed Vitals:   05/10/13 1241  BP: 165/85  Pulse: 69  Temp: 96.8 F (36 C)  Resp: 18  Weight: 147 lb 9.6 oz (66.951 kg)  SpO2: 95%    GENERAL APPEARANCE: No acute distress, appropriately groomed, normal body habitus. Alert, pleasant, conversant. SKIN: No diaphoresis, rash.  Forehead with large area of resolving superficial abrasions EYES: Conjunctiva/lids clear. Pupils round, reactive. EARS: decreased Hearing  NOSE: No deformity or discharge. MOUTH/THROAT: Lips w/o lesions. Oral mucosa, tongue moist, w/o lesion.   NECK: Brace has slid up above chin- requires adjustment by nurse and I to reposition properly. Pain with any movement RESPIRATORY: Breathing is even, unlabored. Lung sounds are clear and full.  CARDIOVASCULAR: Heart RRR. No murmur or extra heart sounds  EDEMA: No peripheral edema.  GASTROINTESTINAL: Abdomen is soft, non-tender, not distended w/ normal bowel sounds.  MUSCULOSKELETAL: Moves UE extremities with full ROM, strength and tone.  NEUROLOGIC: Oriented to time, place, person. Cranial nerves 2-12 grossly intact, speech clear, no tremor.   PSYCHIATRIC: Appears mildly anxious re: when next pain pill will be available  ASSESSMENT/PLAN  C2 cervical fracture Continues with significant pain not adequately managed with hydrocodone. Will add Robaxin as adjunct medication. Patient is demonstrating some mild adverse effects from hydrocodone with confusion and disorientation. Continue to monitor this. Neck brace readjusted today, nursing staff will monitor position of the brace and readjust as needed. Have recommended patient be up in chair for meals and back in the bed with head of bed elevated to avoid slumping in the chair.  Return visit to Dr. Jordan Likes has been arranged for September 18.   Time: 30 minutes, >50% spent counseling/ care coordination Follow up: As needed  Schae Cando T.Danetra Glock, NP-C 05/10/2013

## 2013-05-13 ENCOUNTER — Telehealth: Payer: Self-pay | Admitting: Internal Medicine

## 2013-05-13 NOTE — Telephone Encounter (Signed)
Routed to Dr. Ladona Ridgel and Dennis Bast, RN, as an fyi.

## 2013-05-13 NOTE — Telephone Encounter (Signed)
New Problem  Pt states husband fell on 9/1 not at home// If st jude wishes to do a remote check of ICD he is in rehab at [facility] and not sure when he will return.   [facility] Room [phone]  Pt's wife just wanted office to be aware.

## 2013-05-14 ENCOUNTER — Non-Acute Institutional Stay (SKILLED_NURSING_FACILITY): Payer: Medicare Other | Admitting: Geriatric Medicine

## 2013-05-14 ENCOUNTER — Encounter: Payer: Self-pay | Admitting: Geriatric Medicine

## 2013-05-14 DIAGNOSIS — IMO0002 Reserved for concepts with insufficient information to code with codable children: Secondary | ICD-10-CM

## 2013-05-14 DIAGNOSIS — S12100D Unspecified displaced fracture of second cervical vertebra, subsequent encounter for fracture with routine healing: Secondary | ICD-10-CM

## 2013-05-14 NOTE — Progress Notes (Signed)
Patient ID: Jacob Rosales, male   DOB: [birth year], 79 y.o.   MRN: [id number] [facility] [phone])  Code Status: DNR Contact Information   Name Relation Home Work Mobile   Hon,[name] Spouse [phone]     [name],[name] Daughter [phone]         Chief Complaint  Patient presents with  . F/U C2 fracture    Increased pain    HPI: This 79 y.o. male resident of [facility] was admitted to Rehab section 05/06/2013.  He had a fall 05/03/13 with resultant neck pain. Evaluation in the emergency department included CT scan of the cervical spine which demonstrated a coronal fracture of the body of C2 without significant displacement. Patient was evaluated by Dr. Julio Sicks; treatment plan is neck immobilization in collar, mobilize with physical therapy. Repeat cervical spine film on September 2 revealed stable fracture, patient was discharged home, instructed to take Tylenol for pain.  Patient was evaluated in Clinic 9/3 due to severe pain in his neck and his head, having difficult time with mobility in his home, requiring spouse to assist him.  After some discussion patient does report that he felt like something "popped" this morning when he tried to get out of bed.  An X-ray was obtained, no new fracture identified. Patient was RX hydrocodone/acetaminophen for pain management. He continued to have difficulty with ADLs/mobilizing, was admitted to Rehab section when a room became available.  Patient's spouse called today to report patient "...is having more pain.Marland Kitchenwould have expected him to be feeling better by now...". Spouse called Dr.Poole's office for advice, was told if patient's pain is severe he should return to the hospital.  On arrival, patient was resting comfortably in bed. He tells me that he had very bad pain while sitting in the recliner chair eating his lunch. OT assisted nursing staff  in transferring patient back to bed. She  also adjusted the neck brace as it was not positioned well while he was slumped in the recliner chair.   Allergies  Allergen Reactions  . Warfarin Sodium Other (See Comments)    REACTION: Stomach bleeding  . Amlodipine Besylate Swelling   Medications Reviewed  DATA REVIEWED  Radiologic Exams 05/04/2013:   CT HEAD IMPRESSION: No acute intracranial abnormality.   CT CERVICAL SPINE IMPRESSION:    1. Comminuted C2 vertebral body and bilateral pedicle fracture. Mild displacement of fracture fragments and small volume epidural hematoma at the C2 level.   2. Osteopenia. No other acute cervical spine or skull base fracture identified.       [facility] X-ray  05/05/2013 Cervical Spine: Limited views- no acute fracture noted   Cardiovascular Exams:   Laboratory Studies   [facility] 09/29/2012 glucose 82, BUN 23, creatinine 1.39, sodium 138, potassium 4.4. Protein/LFTs WNL  Total cholesterol 143, triglycerides 72, HDL 52, LDL 77 03/30/2013 PSA .85    [facility] Lab 05/03/2013 hemoglobin 14.3, hematocrit 42.0  Glucose 116, BUN 20, creatinine 1.4, sodium 132, potassium 4.7   Review of Systems  DATA OBTAINED: from patient, nurse, spouse, daughter GENERAL:    Feels more comfortable than earlier today. Difficult to eat/ drink due to neck brace SKIN: No itch, rash   EYES: No eye pain, dryness or itching  No change in vision EARS: No earache, change in hearing NOSE: No congestion, drainage or bleeding MOUTH/THROAT: No mouth or tooth pain  No sore throat  Some difficulty chewing/ swallowing due to neck brace RESPIRATORY: No  cough, wheezing, SOB CARDIAC: No chest pain, palpitations  No edema. GI: No abdominal pain  No N/V/D or constipation  No heartburn or reflux  GU: No dysuria, urinary frequency present (not new)   No change in urine volume or character   MUSCULOSKELETAL: Neck pain, difficulty moving NEUROLOGIC: No dizziness, fainting, headache. No numbness, tingling, weakness.   PSYCHIATRIC: Mild anxiety  Physical Exam Filed Vitals:   05/14/13 1738  BP: 111/64  Pulse: 66  Temp: 97.9 F (36.6 C)  Resp: 18  SpO2: 92%    GENERAL APPEARANCE: No acute distress, appropriately groomed, normal body habitus. Alert, pleasant, conversant. SKIN: No diaphoresis, rash.  Forehead with large area nearly resolved superficial abrasions EYES: Conjunctiva/lids clear. Pupils round, reactive. EARS: decreased Hearing  NOSE: No deformity or discharge. MOUTH/THROAT: Lips w/o lesions. Oral mucosa, tongue moist, w/o lesion.   NECK: Brace is positioned well, neck is tilted slightly to the right. No edema noted under brace. hRESPIRATORY: Breathing is even, unlabored. Lung sounds are clear and full.  CARDIOVASCULAR: Heart RRR. No murmur or extra heart sounds  EDEMA: No peripheral edema.  GASTROINTESTINAL: Abdomen is soft, non-tender, not distended w/ normal bowel sounds.  MUSCULOSKELETAL: Moves UE extremities with full ROM, strength and tone.  Ambulates with walker / minimal assistance. Positioned in straight back chair - feels comfortable NEUROLOGIC: Oriented to time, place, person. Speech clear, no tremor.   PSYCHIATRIC: Appears mildly anxious re: when next pain pill will be available  ASSESSMENT/PLAN  C2 cervical fracture C2 fracture and mobilized in cervical collar since September 1. Patient continues to have periods of very severe pain, no neurologic changes. The pain is most notable when he is sitting with very poor posture in recliner chair. Have advised to stop sitting in thta chair. He can comfortably sit in a straight back chair, this is more advisable especially for meals.    Follow up: As needed  Keary Hanak T.Allison [employer], NP-C 05/14/2013

## 2013-05-14 NOTE — Assessment & Plan Note (Signed)
C2 fracture and mobilized in cervical collar since September 1. Patient continues to have periods of very severe pain, no neurologic changes. The pain is most notable when he is sitting with very poor posture in recliner chair. Have advised to stop sitting in thta chair. He can comfortably sit in a straight back chair, this is more advisable especially for meals.

## 2013-05-17 NOTE — Telephone Encounter (Signed)
Spoke w/wife in regards to remote checks. Pt is scheduled for 06-28-13 for Merlin check. Pt should be home by then. Instructed to wife if not may need to take [employer] to [employer]. Wife aware.

## 2013-05-20 ENCOUNTER — Other Ambulatory Visit: Payer: Self-pay | Admitting: Neurosurgery

## 2013-05-20 NOTE — Progress Notes (Signed)
SDW assessment completed by pt nurse Olegario Messier of [facility]. Olegario Messier made aware to stop NSAIDS, and herbal medications; she will confirm with pt doctor if she should hold pt dose of Aggrenox tonight.

## 2013-05-21 ENCOUNTER — Inpatient Hospital Stay (HOSPITAL_COMMUNITY)
Admission: RE | Admit: 2013-05-21 | Discharge: 2013-05-23 | DRG: 473 | Disposition: A | Discharge status: Skilled Nursing Facility | Payer: Medicare Other | Source: Ambulatory Visit | Attending: Neurosurgery | Admitting: Neurosurgery

## 2013-05-21 ENCOUNTER — Inpatient Hospital Stay (HOSPITAL_COMMUNITY): Payer: Medicare Other

## 2013-05-21 ENCOUNTER — Encounter (HOSPITAL_COMMUNITY)
Admission: RE | Disposition: A | Discharge status: Skilled Nursing Facility | Payer: Self-pay | Source: Ambulatory Visit | Attending: Neurosurgery

## 2013-05-21 ENCOUNTER — Encounter (HOSPITAL_COMMUNITY): Payer: Self-pay | Admitting: *Deleted

## 2013-05-21 ENCOUNTER — Encounter (HOSPITAL_COMMUNITY): Payer: Self-pay | Admitting: Critical Care Medicine

## 2013-05-21 ENCOUNTER — Inpatient Hospital Stay (HOSPITAL_COMMUNITY): Payer: Medicare Other | Admitting: Critical Care Medicine

## 2013-05-21 ENCOUNTER — Encounter (HOSPITAL_COMMUNITY): Payer: Self-pay | Admitting: Pharmacy Technician

## 2013-05-21 DIAGNOSIS — Z9181 History of falling: Secondary | ICD-10-CM

## 2013-05-21 DIAGNOSIS — M4802 Spinal stenosis, cervical region: Secondary | ICD-10-CM | POA: Diagnosis present

## 2013-05-21 DIAGNOSIS — Z981 Arthrodesis status: Secondary | ICD-10-CM

## 2013-05-21 DIAGNOSIS — S12100A Unspecified displaced fracture of second cervical vertebra, initial encounter for closed fracture: Principal | ICD-10-CM

## 2013-05-21 HISTORY — PX: POSTERIOR CERVICAL FUSION/FORAMINOTOMY: SHX5038

## 2013-05-21 LAB — CBC
HCT: 42.4 % (ref 39.0–52.0)
Hemoglobin: 14.8 g/dL (ref 13.0–17.0)
MCHC: 34.9 g/dL (ref 30.0–36.0)
RBC: 4.59 MIL/uL (ref 4.22–5.81)
WBC: 7.9 10*3/uL (ref 4.0–10.5)

## 2013-05-21 LAB — BASIC METABOLIC PANEL
BUN: 31 mg/dL — ABNORMAL HIGH (ref 6–23)
Chloride: 95 mEq/L — ABNORMAL LOW (ref 96–112)
GFR calc Af Amer: 68 mL/min — ABNORMAL LOW (ref >90)
Potassium: 4.5 mEq/L (ref 3.5–5.1)
Sodium: 131 mEq/L — ABNORMAL LOW (ref 135–145)

## 2013-05-21 LAB — SURGICAL PCR SCREEN: Staphylococcus aureus: NEGATIVE

## 2013-05-21 SURGERY — POSTERIOR CERVICAL FUSION/FORAMINOTOMY LEVEL 2
Anesthesia: General | Wound class: Clean

## 2013-05-21 MED ORDER — TRIAMTERENE-HCTZ 37.5-25 MG PO TABS
1.0000 | ORAL_TABLET | Freq: Every day | ORAL | Status: DC
  Administered 2013-05-22 – 2013-05-23 (×2): 1 via ORAL
  Filled 2013-05-21 (×2): qty 1

## 2013-05-21 MED ORDER — HEMOSTATIC AGENTS (NO CHARGE) OPTIME
TOPICAL | Status: DC | PRN
  Administered 2013-05-21: 1 via TOPICAL

## 2013-05-21 MED ORDER — ASPIRIN-DIPYRIDAMOLE ER 25-200 MG PO CP12
1.0000 | ORAL_CAPSULE | Freq: Two times a day (BID) | ORAL | Status: DC
  Administered 2013-05-22 – 2013-05-23 (×3): 1 via ORAL
  Filled 2013-05-21 (×5): qty 1

## 2013-05-21 MED ORDER — LORAZEPAM 1 MG PO TABS: 1.0000 mg | ORAL_TABLET | Freq: Every evening | ORAL | Status: DC | PRN

## 2013-05-21 MED ORDER — ONDANSETRON HCL 4 MG/2ML IJ SOLN: 4.0000 mg | INTRAMUSCULAR | Status: DC | PRN

## 2013-05-21 MED ORDER — CARVEDILOL 25 MG PO TABS
25.0000 mg | ORAL_TABLET | Freq: Two times a day (BID) | ORAL | Status: DC
  Administered 2013-05-22 – 2013-05-23 (×3): 25 mg via ORAL
  Filled 2013-05-21 (×5): qty 1

## 2013-05-21 MED ORDER — BUPIVACAINE-EPINEPHRINE 0.25% -1:200000 IJ SOLN
INTRAMUSCULAR | Status: DC | PRN
  Administered 2013-05-21: 10 mL

## 2013-05-21 MED ORDER — LACTATED RINGERS IV SOLN
INTRAVENOUS | Status: DC
  Administered 2013-05-21: 14:00:00 via INTRAVENOUS

## 2013-05-21 MED ORDER — ALUM & MAG HYDROXIDE-SIMETH 200-200-20 MG/5ML PO SUSP: 30.0000 mL | Freq: Four times a day (QID) | ORAL | Status: DC | PRN

## 2013-05-21 MED ORDER — TRIAMTERENE-HCTZ 37.5-25 MG PO CAPS: 1.0000 | ORAL_CAPSULE | ORAL | Status: DC

## 2013-05-21 MED ORDER — PHENYLEPHRINE HCL 10 MG/ML IJ SOLN
10.0000 mg | INTRAVENOUS | Status: DC | PRN
  Administered 2013-05-21: 50 ug/min via INTRAVENOUS

## 2013-05-21 MED ORDER — MIDAZOLAM HCL 5 MG/5ML IJ SOLN
INTRAMUSCULAR | Status: DC | PRN
  Administered 2013-05-21: 1 mg via INTRAVENOUS

## 2013-05-21 MED ORDER — MUPIROCIN 2 % EX OINT
TOPICAL_OINTMENT | CUTANEOUS | Status: AC
  Administered 2013-05-21: 1 via NASAL
  Filled 2013-05-21: qty 22

## 2013-05-21 MED ORDER — HYDROMORPHONE HCL PF 1 MG/ML IJ SOLN: 0.5000 mg | INTRAMUSCULAR | Status: DC | PRN

## 2013-05-21 MED ORDER — CEFAZOLIN SODIUM-DEXTROSE 2-3 GM-% IV SOLR
2.0000 g | INTRAVENOUS | Status: AC
Start: 2013-05-22 — End: 2013-05-21
  Administered 2013-05-21: 2 g via INTRAVENOUS
  Filled 2013-05-21 (×2): qty 50

## 2013-05-21 MED ORDER — BISACODYL 10 MG RE SUPP: 10.0000 mg | Freq: Every day | RECTAL | Status: DC | PRN

## 2013-05-21 MED ORDER — PHENYLEPHRINE HCL 10 MG/ML IJ SOLN
INTRAMUSCULAR | Status: DC | PRN
  Administered 2013-05-21: 40 ug via INTRAVENOUS
  Administered 2013-05-21: 80 ug via INTRAVENOUS

## 2013-05-21 MED ORDER — SODIUM CHLORIDE 0.9 % IJ SOLN
3.0000 mL | Freq: Two times a day (BID) | INTRAMUSCULAR | Status: DC
  Administered 2013-05-22 (×2): 3 mL via INTRAVENOUS

## 2013-05-21 MED ORDER — LISINOPRIL 20 MG PO TABS
20.0000 mg | ORAL_TABLET | Freq: Every day | ORAL | Status: DC
  Administered 2013-05-21 – 2013-05-22 (×2): 20 mg via ORAL
  Filled 2013-05-21 (×3): qty 1

## 2013-05-21 MED ORDER — HYDROMORPHONE HCL PF 1 MG/ML IJ SOLN
0.2500 mg | INTRAMUSCULAR | Status: DC | PRN
  Administered 2013-05-21 (×2): 0.5 mg via INTRAVENOUS

## 2013-05-21 MED ORDER — THROMBIN 5000 UNITS EX SOLR
CUTANEOUS | Status: DC | PRN
  Administered 2013-05-21 (×2): 5000 [IU] via TOPICAL

## 2013-05-21 MED ORDER — SODIUM CHLORIDE 0.9 % IJ SOLN: 3.0000 mL | INTRAMUSCULAR | Status: DC | PRN

## 2013-05-21 MED ORDER — DEXMEDETOMIDINE HCL 200 MCG/2ML IV SOLN
INTRAVENOUS | Status: DC | PRN
  Administered 2013-05-21 (×5): 8 ug via INTRAVENOUS

## 2013-05-21 MED ORDER — SIMVASTATIN 20 MG PO TABS
20.0000 mg | ORAL_TABLET | Freq: Every day | ORAL | Status: DC
  Administered 2013-05-22 (×2): 20 mg via ORAL
  Filled 2013-05-21 (×4): qty 1

## 2013-05-21 MED ORDER — POLYETHYLENE GLYCOL 3350 17 G PO PACK
17.0000 g | PACK | Freq: Every day | ORAL | Status: DC | PRN
  Filled 2013-05-21: qty 1

## 2013-05-21 MED ORDER — PROPOFOL 10 MG/ML IV BOLUS
INTRAVENOUS | Status: DC | PRN
  Administered 2013-05-21: 100 mg via INTRAVENOUS

## 2013-05-21 MED ORDER — METHOCARBAMOL 500 MG PO TABS
500.0000 mg | ORAL_TABLET | Freq: Two times a day (BID) | ORAL | Status: DC
  Administered 2013-05-22 – 2013-05-23 (×4): 500 mg via ORAL
  Filled 2013-05-21 (×6): qty 1

## 2013-05-21 MED ORDER — MENTHOL 3 MG MT LOZG: 1.0000 | LOZENGE | OROMUCOSAL | Status: DC | PRN

## 2013-05-21 MED ORDER — VITAMIN D3 25 MCG (1000 UNIT) PO TABS
1000.0000 [IU] | ORAL_TABLET | Freq: Every day | ORAL | Status: DC
  Administered 2013-05-22 – 2013-05-23 (×2): 1000 [IU] via ORAL
  Filled 2013-05-21 (×2): qty 1

## 2013-05-21 MED ORDER — SENNA 8.6 MG PO TABS
1.0000 | ORAL_TABLET | Freq: Two times a day (BID) | ORAL | Status: DC
  Administered 2013-05-21 – 2013-05-23 (×4): 8.6 mg via ORAL
  Filled 2013-05-21 (×5): qty 1

## 2013-05-21 MED ORDER — COCAINE HCL 4 % EX SOLN
CUTANEOUS | Status: AC
  Filled 2013-05-21: qty 4

## 2013-05-21 MED ORDER — POLYETHYL GLYCOL-PROPYL GLYCOL 0.4-0.3 % OP SOLN: 1.0000 [drp] | OPHTHALMIC | Status: DC | PRN

## 2013-05-21 MED ORDER — CEFAZOLIN SODIUM 1-5 GM-% IV SOLN
1.0000 g | Freq: Three times a day (TID) | INTRAVENOUS | Status: AC
  Administered 2013-05-22 (×2): 1 g via INTRAVENOUS
  Filled 2013-05-21 (×2): qty 50

## 2013-05-21 MED ORDER — HYDROCODONE-ACETAMINOPHEN 5-325 MG PO TABS
1.0000 | ORAL_TABLET | ORAL | Status: DC | PRN
  Administered 2013-05-22 – 2013-05-23 (×4): 1 via ORAL
  Filled 2013-05-21 (×4): qty 1

## 2013-05-21 MED ORDER — POLYVINYL ALCOHOL 1.4 % OP SOLN: 1.0000 [drp] | OPHTHALMIC | Status: DC | PRN

## 2013-05-21 MED ORDER — FLEET ENEMA 7-19 GM/118ML RE ENEM
1.0000 | ENEMA | Freq: Once | RECTAL | Status: AC | PRN
  Filled 2013-05-21: qty 1

## 2013-05-21 MED ORDER — DEXMEDETOMIDINE HCL IN NACL 200 MCG/50ML IV SOLN
0.4000 ug/kg/h | INTRAVENOUS | Status: DC
  Filled 2013-05-21: qty 50

## 2013-05-21 MED ORDER — FENTANYL CITRATE 0.05 MG/ML IJ SOLN
INTRAMUSCULAR | Status: DC | PRN
  Administered 2013-05-21: 50 ug via INTRAVENOUS

## 2013-05-21 MED ORDER — MUPIROCIN 2 % EX OINT
TOPICAL_OINTMENT | Freq: Two times a day (BID) | CUTANEOUS | Status: DC
  Administered 2013-05-21: 1 via NASAL
  Filled 2013-05-21: qty 22

## 2013-05-21 MED ORDER — SODIUM CHLORIDE 0.9 % IV SOLN
250.0000 mL | INTRAVENOUS | Status: DC
  Administered 2013-05-21: 250 mL via INTRAVENOUS

## 2013-05-21 MED ORDER — OXYCODONE-ACETAMINOPHEN 5-325 MG PO TABS: 1.0000 | ORAL_TABLET | ORAL | Status: DC | PRN

## 2013-05-21 MED ORDER — CYCLOBENZAPRINE HCL 10 MG PO TABS
10.0000 mg | ORAL_TABLET | Freq: Three times a day (TID) | ORAL | Status: DC | PRN
  Administered 2013-05-22: 10 mg via ORAL
  Filled 2013-05-21 (×2): qty 1

## 2013-05-21 MED ORDER — PHENOL 1.4 % MT LIQD: 1.0000 | OROMUCOSAL | Status: DC | PRN

## 2013-05-21 MED ORDER — HYDROMORPHONE HCL PF 1 MG/ML IJ SOLN
INTRAMUSCULAR | Status: AC
  Filled 2013-05-21: qty 1

## 2013-05-21 MED ORDER — ROCURONIUM BROMIDE 100 MG/10ML IV SOLN
INTRAVENOUS | Status: DC | PRN
  Administered 2013-05-21: 50 mg via INTRAVENOUS

## 2013-05-21 MED ORDER — HYDROCODONE-ACETAMINOPHEN 5-325 MG PO TABS
1.0000 | ORAL_TABLET | Freq: Three times a day (TID) | ORAL | Status: DC
  Administered 2013-05-21 – 2013-05-23 (×5): 1 via ORAL
  Filled 2013-05-21 (×5): qty 1

## 2013-05-21 MED ORDER — 0.9 % SODIUM CHLORIDE (POUR BTL) OPTIME
TOPICAL | Status: DC | PRN
  Administered 2013-05-21: 1000 mL

## 2013-05-21 MED ORDER — ONDANSETRON HCL 4 MG/2ML IJ SOLN
INTRAMUSCULAR | Status: DC | PRN
  Administered 2013-05-21: 4 mg via INTRAVENOUS

## 2013-05-21 MED ORDER — LACTATED RINGERS IV SOLN
INTRAVENOUS | Status: DC | PRN
  Administered 2013-05-21: 17:00:00 via INTRAVENOUS

## 2013-05-21 MED ORDER — ARTIFICIAL TEARS OP OINT
TOPICAL_OINTMENT | OPHTHALMIC | Status: DC | PRN
  Administered 2013-05-21: 1 via OPHTHALMIC

## 2013-05-21 MED ORDER — IPRATROPIUM BROMIDE 0.06 % NA SOLN
2.0000 | Freq: Four times a day (QID) | NASAL | Status: DC
  Administered 2013-05-23: 2 via NASAL
  Filled 2013-05-21: qty 15

## 2013-05-21 MED ORDER — SENNOSIDES-DOCUSATE SODIUM 8.6-50 MG PO TABS: 1.0000 | ORAL_TABLET | Freq: Every day | ORAL | Status: DC

## 2013-05-21 MED ORDER — ACETAMINOPHEN 325 MG PO TABS: 650.0000 mg | ORAL_TABLET | ORAL | Status: DC | PRN

## 2013-05-21 MED ORDER — ONDANSETRON HCL 4 MG/2ML IJ SOLN: 4.0000 mg | Freq: Once | INTRAMUSCULAR | Status: DC | PRN

## 2013-05-21 MED ORDER — ACETAMINOPHEN 650 MG RE SUPP: 650.0000 mg | RECTAL | Status: DC | PRN

## 2013-05-21 SURGICAL SUPPLY — 67 items
2.4MM DRILL BIT ×2 IMPLANT
BAG DECANTER FOR FLEXI CONT (MISCELLANEOUS) ×2 IMPLANT
BENZOIN TINCTURE PRP APPL 2/3 (GAUZE/BANDAGES/DRESSINGS) ×2 IMPLANT
BLADE SURG ROTATE 9660 (MISCELLANEOUS) IMPLANT
BRUSH SCRUB EZ PLAIN DRY (MISCELLANEOUS) ×2 IMPLANT
BUR MATCHSTICK NEURO 3.0 LAGG (BURR) ×2 IMPLANT
CANISTER SUCTION 2500CC (MISCELLANEOUS) ×2 IMPLANT
CLOTH BEACON ORANGE TIMEOUT ST (SAFETY) ×2 IMPLANT
CLSR STERI-STRIP ANTIMIC 1/2X4 (GAUZE/BANDAGES/DRESSINGS) ×2 IMPLANT
CONT SPEC 4OZ CLIKSEAL STRL BL (MISCELLANEOUS) ×2 IMPLANT
DERMABOND ADVANCED (GAUZE/BANDAGES/DRESSINGS) ×1
DERMABOND ADVANCED .7 DNX12 (GAUZE/BANDAGES/DRESSINGS) ×1 IMPLANT
DRAPE C-ARM 42X72 X-RAY (DRAPES) ×6 IMPLANT
DRAPE LAPAROTOMY 100X72 PEDS (DRAPES) ×2 IMPLANT
DRAPE POUCH INSTRU U-SHP 10X18 (DRAPES) ×2 IMPLANT
DRSG OPSITE POSTOP 4X6 (GAUZE/BANDAGES/DRESSINGS) ×2 IMPLANT
DURAPREP 26ML APPLICATOR (WOUND CARE) ×2 IMPLANT
ELECT REM PT RETURN 9FT ADLT (ELECTROSURGICAL) ×2
ELECTRODE REM PT RTRN 9FT ADLT (ELECTROSURGICAL) ×1 IMPLANT
EVACUATOR 1/8 PVC DRAIN (DRAIN) IMPLANT
GAUZE SPONGE 4X4 16PLY XRAY LF (GAUZE/BANDAGES/DRESSINGS) IMPLANT
GLOVE BIO SURGEON STRL SZ 6.5 (GLOVE) ×6 IMPLANT
GLOVE BIO SURGEON STRL SZ8 (GLOVE) ×2 IMPLANT
GLOVE BIOGEL PI IND STRL 7.0 (GLOVE) ×2 IMPLANT
GLOVE BIOGEL PI IND STRL 8 (GLOVE) ×1 IMPLANT
GLOVE BIOGEL PI INDICATOR 7.0 (GLOVE) ×2
GLOVE BIOGEL PI INDICATOR 8 (GLOVE) ×1
GLOVE ECLIPSE 8.5 STRL (GLOVE) ×4 IMPLANT
GLOVE EXAM NITRILE LRG STRL (GLOVE) IMPLANT
GLOVE EXAM NITRILE MD LF STRL (GLOVE) IMPLANT
GLOVE EXAM NITRILE XL STR (GLOVE) IMPLANT
GLOVE EXAM NITRILE XS STR PU (GLOVE) IMPLANT
GOWN BRE IMP SLV AUR LG STRL (GOWN DISPOSABLE) ×2 IMPLANT
GOWN BRE IMP SLV AUR XL STRL (GOWN DISPOSABLE) ×4 IMPLANT
GOWN STRL REIN 2XL LVL4 (GOWN DISPOSABLE) IMPLANT
KIT BASIN OR (CUSTOM PROCEDURE TRAY) ×2 IMPLANT
KIT ROOM TURNOVER OR (KITS) ×2 IMPLANT
MASTERGRAFT STRIP 10CM ×2 IMPLANT
MATRIX MASTERGRAFT STRIP 10CM ×1 IMPLANT
NEEDLE HYPO 22GX1.5 SAFETY (NEEDLE) ×2 IMPLANT
NEEDLE SPNL 22GX3.5 QUINCKE BK (NEEDLE) ×2 IMPLANT
NS IRRIG 1000ML POUR BTL (IV SOLUTION) ×2 IMPLANT
PACK LAMINECTOMY NEURO (CUSTOM PROCEDURE TRAY) ×2 IMPLANT
PAD ARMBOARD 7.5X6 YLW CONV (MISCELLANEOUS) ×6 IMPLANT
PIN MAYFIELD SKULL DISP (PIN) ×2 IMPLANT
ROD VERTEX 3.2MM 240MM (Rod) ×2 IMPLANT
SCREW 3.5X22 (Screw) ×2 IMPLANT
SCREW 3.5X24 (Screw) ×2 IMPLANT
SCREW CANCELLOUS 3.5X14MM (Screw) ×4 IMPLANT
SCREW SET M6 (Screw) ×12 IMPLANT
SCREW VERTEX PT 3.5X30 (Screw) IMPLANT
SCREW VERTEX PT 34MM (Screw) ×4 IMPLANT
SOLUTION ANTI FOG 6CC (MISCELLANEOUS) ×2 IMPLANT
SPONGE GAUZE 4X4 12PLY (GAUZE/BANDAGES/DRESSINGS) ×2 IMPLANT
SPONGE LAP 4X18 X RAY DECT (DISPOSABLE) IMPLANT
SPONGE SURGIFOAM ABS GEL SZ50 (HEMOSTASIS) ×2 IMPLANT
STRIP CLOSURE SKIN 1/2X4 (GAUZE/BANDAGES/DRESSINGS) ×2 IMPLANT
SUT VIC AB 0 CT1 18XCR BRD8 (SUTURE) ×1 IMPLANT
SUT VIC AB 0 CT1 8-18 (SUTURE) ×1
SUT VIC AB 2-0 CT1 18 (SUTURE) ×2 IMPLANT
SUT VIC AB 3-0 SH 8-18 (SUTURE) ×2 IMPLANT
SYR 20ML ECCENTRIC (SYRINGE) ×2 IMPLANT
TOWEL OR 17X24 6PK STRL BLUE (TOWEL DISPOSABLE) ×2 IMPLANT
TOWEL OR 17X26 10 PK STRL BLUE (TOWEL DISPOSABLE) ×2 IMPLANT
TUBE CONNECTING 12X1/4 (SUCTIONS) ×2 IMPLANT
TUBE CONNECTING 20X1/4 (TUBING) ×2 IMPLANT
WATER STERILE IRR 1000ML POUR (IV SOLUTION) ×2 IMPLANT

## 2013-05-21 NOTE — Brief Op Note (Signed)
05/21/2013  7:24 PM  PATIENT:  Jacob Rosales  79 y.o. male  PRE-OPERATIVE DIAGNOSIS:  Cervical fracture  POST-OPERATIVE DIAGNOSIS:  Cervical fracture  PROCEDURE:  Procedure(s) with comments: Cervical One, Cervical Two, Cervical Three Posterior cervical fusion with lateral mass fixation (N/A) - POSTERIOR CERVICAL FUSION/FORAMINOTOMY LEVEL 2  SURGEON:  Surgeon(s) and Role:    * Temple Pacini, MD - Primary    * Tia Alert, MD - Assisting  PHYSICIAN ASSISTANT:   ASSISTANTS:    ANESTHESIA:   general  EBL:     BLOOD ADMINISTERED:none  DRAINS: none   LOCAL MEDICATIONS USED:  NONE  SPECIMEN:  No Specimen  DISPOSITION OF SPECIMEN:  N/A  COUNTS:  YES  TOURNIQUET:  * No tourniquets in log *  DICTATION: .Dragon Dictation  PLAN OF CARE: Admit to inpatient   PATIENT DISPOSITION:  PACU - hemodynamically stable.   Delay start of Pharmacological VTE agent (>24hrs) due to surgical blood loss or risk of bleeding: yes

## 2013-05-21 NOTE — Transfer of Care (Signed)
Immediate Anesthesia Transfer of Care Note  Patient: DERL ABALOS  Procedure(s) Performed: Procedure(s) with comments: Cervical One, Cervical Two, Cervical Three Posterior cervical fusion with lateral mass fixation (N/A) - POSTERIOR CERVICAL FUSION/FORAMINOTOMY LEVEL 2  Patient Location: PACU  Anesthesia Type:General  Level of Consciousness: lethargic and responds to stimulation  Airway & Oxygen Therapy: Patient Spontanous Breathing and Patient connected to face mask oxygen  Post-op Assessment: Report given to PACU RN, Post -op Vital signs reviewed and stable and Patient moving all extremities  Post vital signs: Reviewed and stable  Complications: No apparent anesthesia complications

## 2013-05-21 NOTE — H&P (Signed)
  Interim history and physical. The patient was seen in followup in the office. He is having increasing neck pain. His posture has changed and his neck is now rested into a fixed flexion posture turned towards the right. He is having marked neck pain with radiation to his posterior scalp. He's having no upper or lower extremity symptoms. He denies any numbness or weakness. He has been compliant with his collar.  Past medical history is stable.  On exam he is awake and alert. He is oriented and appropriate. Cranial nerve function is intact. Motor and sensory function of the extremities are normal. Chest and abdomen are benign. His neck is flexed and turned towards the right. He has a cervical collar in place. He is significantly tender.  Assessment coronal plane C2 fracture with severe subluxation and angulation causing marked spinal stenosis and deformity. I have discussed options of available for treatment. I have recommended that he undergo a posterior cervical fusion with lateral mass instrumentation from C1-C3. This will require closed and/or open reduction of this deformity once the patient is asleep under anesthesia. I discussed the risks and benefits involved with surgery including but not limited to the risk of anesthesia, bleeding, infection, CSF leak, nerve root injury, spinal cord injury, fusion failure, continued pain, and nonbenefit. The patient and his family are aware and wish to proceed.

## 2013-05-21 NOTE — Op Note (Signed)
Date of procedure: 05/21/2013  Date of dictation: Same  Service: Neurosurgery  Preoperative diagnosis: C2 fracture dislocation without spinal cord injury   Postoperative diagnosis: Same  Procedure Name: Closed reduction of C2 fracture.   C1, C2, C3 posterior cervical fusion with lateral mass screws and morselized allograft.   C1-2 posterior Sontag interspinous wiring   Surgeon:Keigan Tafoya A.Zan Orlick, M.D.  Asst. Surgeon: Yetta Barre  Anesthesia: General  Indication:79 year old male status post fall with resultant C2 fracture. The fracture was attempted to be treated in a closed fashion with a rigid cervical orthosis. Patient has developed severe neck pain with occipital pain. He has obvious severe deformity on physical exam and followup x-rays have demonstrated marked anterior listhesis of C1 and the C2 fracture with severe angulation. Patient presents now for reduction, and posterior fusion.  Operative note:After induction of anesthesia was achieved the patient was kept on the operative bed and under fluoroscopic guidance traction was placed on the patient's head and this fracture was reduced and the spine was returned to normal alignment. Mayfield pin headrest was then applied and the patient was turned prone onto bolsters and her appropriately padded. Fluoroscopy was again used and his alignment was found to be stable. The posterior neck was prepped and draped. Incision was then made and then dissection exposed the underlying suboccipital region as well as the lamina of C1-C2 and C3 and the lateral mass/articular masses of C1-2 and 3. Retractor was placed. C2 nerve roots were mobilized. Injury points into the lateral mass of C1 were determined. Pilot holes were drilled. Under fluoroscopic guidance the lateral mass was drilled in a bicortical fashion. A 34 mm partially-threaded vertex screws were then passed into the lateral mass of C1. Good purchase of the bone was achieved. Pars interarticularis screws  at C2 were placed bilaterally. First this was done by drilling a pilot hole and then continuing a pilot hole under fluoroscopic guidance. 22 mm vertex screws were placed bilaterally at C2. Lateral mass screws were placed at C3. 14 mm screws were placed bilaterally at C3 with good purchase. The arch of C1 was dissected free. A suture was passed beneath the arch of C1. A single Songer cable was then passed beneath the lamina of C1 and looped around the spinous process of C2. Using the technique described by Dr. Loura Pardon the interspinous wiring was tightened down further reducing the patient's deformity. Short segment titanium rods and placed over the screw heads of C1-C2 and C3. Locking caps were then placed over the screw heads. Locking catching and engaged with the construct under mild compression. Posterior aspect of C1 and C2 were decorticated high-speed drill. Morselized allograft was then packed posteriorly. Wound is then irrigated with and bike sedation. It was then closed in layers using Vicryl sutures. Steri-Strips and sterile dressing were applied. There were no apparent complications. Patient tolerated the procedure well and he returns to the recovery room postop.

## 2013-05-21 NOTE — Anesthesia Postprocedure Evaluation (Signed)
  Anesthesia Post-op Note  Patient: Jacob Rosales  Procedure(s) Performed: Procedure(s) with comments: Cervical One, Cervical Two, Cervical Three Posterior cervical fusion with lateral mass fixation (N/A) - POSTERIOR CERVICAL FUSION/FORAMINOTOMY LEVEL 2  Patient Location: PACU  Anesthesia Type:General  Level of Consciousness: awake  Airway and Oxygen Therapy: Patient Spontanous Breathing  Post-op Pain: mild  Post-op Assessment: Post-op Vital signs reviewed  Post-op Vital Signs: Reviewed  Complications: No apparent anesthesia complications

## 2013-05-21 NOTE — Preoperative (Signed)
Beta Blockers   Reason not to administer Beta Blockers:Not Applicable 

## 2013-05-21 NOTE — Anesthesia Preprocedure Evaluation (Addendum)
Anesthesia Evaluation  Patient identified by MRN, date of birth, ID band Patient awake    Reviewed: Allergy & Precautions, H&P , NPO status , Patient's Chart, lab work & pertinent test results, reviewed documented beta blocker date and time   Airway Mallampati: II TM Distance: >3 FB Neck ROM: Limited    Dental  (+) Teeth Intact   Pulmonary  breath sounds clear to auscultation        Cardiovascular hypertension, + CAD and + CABG + Cardiac Defibrillator Rhythm:Regular Rate:Normal     Neuro/Psych TIA   GI/Hepatic   Endo/Other    Renal/GU      Musculoskeletal   Abdominal   Peds  Hematology   Anesthesia Other Findings   Reproductive/Obstetrics                          Anesthesia Physical Anesthesia Plan  ASA: IV  Anesthesia Plan: General   Post-op Pain Management:    Induction: Intravenous  Airway Management Planned: Fiberoptic Intubation Planned, Awake Intubation Planned and Nasal ETT  Additional Equipment: Arterial line  Intra-op Plan:   Post-operative Plan: Extubation in OR  Informed Consent: I have reviewed the patients History and Physical, chart, labs and discussed the procedure including the risks, benefits and alternatives for the proposed anesthesia with the patient or authorized representative who has indicated his/her understanding and acceptance.   Dental advisory given  Plan Discussed with: Anesthesiologist, Surgeon and CRNA  Anesthesia Plan Comments:        Anesthesia Quick Evaluation

## 2013-05-22 NOTE — Progress Notes (Signed)
Orthopedic Tech Progress Note Patient Details:  [name] [birth year] [id number]  Patient ID: Jacob Rosales, male   DOB: February 15, 1928, 79 y.o.   MRN: [id number]   Jacob Rosales 05/22/2013, 5:31 PMAspen collar completed by [employer].

## 2013-05-22 NOTE — Progress Notes (Signed)
Orthopedic Tech Progress Note Patient Details:  Jacob Rosales 05-26-1928 [id number]  Patient ID: Jacob Rosales, male   DOB: Jun 15, 1928, 79 y.o.   MRN: [id number]   [name] 05/22/2013, 3:46 PMCalled [employer] for aspen collar.

## 2013-05-22 NOTE — Progress Notes (Signed)
Patient ID: Jacob Rosales, male   DOB: August 07, 1928, 79 y.o.   MRN: [id number] Seems to be doing well. He doesn't complain of anything except low back pain. He feels like his neck is doing better. He remains in his collar. He moves all 4 extremities with good strength. Mobilize today. transfer to floor.

## 2013-05-22 NOTE — Progress Notes (Signed)
Physical Therapy Evaluation Patient Details Name: Jacob Rosales MRN: [id number] DOB: [birth year] Today's Date: 05/22/2013 Time: 4098-1191 PT Time Calculation (min): 35 min  PT Assessment / Plan / Recommendation History of Present Illness  Pt admit for C1-3 posterior cervical fusion secondary to C2 fracture dislocation.  Lives at [place] and has been in Rehab at [facility] for several weeeks prior to surgery.    Clinical Impression  Pt admitted with above. Pt currently with functional limitations due to the deficits listed below (see PT Problem List).  Pt will benefit from skilled PT to increase their independence and safety with mobility to allow discharge to the venue listed below.     PT Assessment  Patient needs continued PT services    Follow Up Recommendations  SNF;Supervision/Assistance - 24 hour (SNF at [facility] holding bed per family)                Equipment Recommendations  None recommended by PT         Frequency Min 5X/week    Precautions / Restrictions Precautions Precautions: Fall;Cervical Precaution Comments: pt read precaution sheet Required Braces or Orthoses: Cervical Brace Cervical Brace: Hard collar;At all times Restrictions Weight Bearing Restrictions: No   Pertinent Vitals/Pain VSS, some pain      Mobility  Bed Mobility Bed Mobility: Rolling Right;Right Sidelying to Sit;Sitting - Scoot to Edge of Bed Rolling Right: 4: Min assist;With rail Right Sidelying to Sit: 3: Mod assist;HOB elevated;With rails Sitting - Scoot to Edge of Bed: 4: Min assist Sit to Sidelying Right: Not Tested (comment) Details for Bed Mobility Assistance: vc for bed mobility Transfers Transfers: Sit to Stand;Stand to Sit Sit to Stand: 3: Mod assist;With upper extremity assist;From bed Stand to Sit: 4: Min assist;With upper extremity assist;With armrests;To chair/3-in-1 Details for Transfer Assistance: vc for hand placement Ambulation/Gait Ambulation/Gait Assistance:  1: +2 Total assist Ambulation/Gait: Patient Percentage: 60% Ambulation Distance (Feet): 10 Feet Assistive device: Rolling walker Ambulation/Gait Assistance Details: Unsteady on feet.  Needed steadying assist and verbal cues to sequence steps and RW.  Forward flexed posture with cues for upright posture.   Gait Pattern: Step-through pattern;Decreased stride length;Decreased dorsiflexion - right;Decreased dorsiflexion - left Gait velocity: Decreased gait velocity. General Gait Details: Pt ambulates in a guarded manner secondary to cervical ROM limitations and pain.  Pt has posterior pelvic tilt at rest and during ambulation Stairs: No Wheelchair Mobility Wheelchair Mobility: No         PT Diagnosis: Acute pain;Difficulty walking  PT Problem List: Decreased mobility;Decreased strength;Decreased range of motion;Decreased activity tolerance;Decreased knowledge of precautions PT Treatment Interventions: DME instruction;Gait training;Functional mobility training;Therapeutic activities;Therapeutic exercise;Patient/family education     PT Goals(Current goals can be found in the care plan section) Acute Rehab PT Goals Patient Stated Goal: go home PT Goal Formulation: With patient Time For Goal Achievement: 05/29/13 Potential to Achieve Goals: Good  Visit Information  Last PT Received On: 05/22/13 Assistance Needed: +2 History of Present Illness: Pt admit for C1-3 posterior cervical fusion secondary to C2 fracture dislocation.  Lives at [facility] and has been in Rehab at [facility] for several weeeks prior to surgery.         Prior Functioning  Home Living Family/patient expects to be discharged to:: Private residence Living Arrangements: Spouse/significant other Available Help at Discharge: Family;Available 24 hours/day Type of Home: Independent living facility Home Access: Level entry Home Layout: One level Home Equipment: Walker - 2 wheels Prior Function Level of Independence:  Needs assistance Gait /  Transfers Assistance Needed: min assist to ambulate with RW ADL's / Homemaking Assistance Needed: min assist Comments: walked 1/4 mile round trip to dining room prior to labor day Communication Communication: No difficulties Dominant Hand: Right    Cognition  Cognition Arousal/Alertness: Awake/alert Behavior During Therapy: WFL for tasks assessed/performed Overall Cognitive Status: Within Functional Limits for tasks assessed    Extremity/Trunk Assessment Upper Extremity Assessment Upper Extremity Assessment: Defer to OT evaluation Lower Extremity Assessment Lower Extremity Assessment: Generalized weakness Cervical / Trunk Assessment Cervical / Trunk Assessment: Other exceptions;Kyphotic Cervical / Trunk Exceptions: Observed pt with posterior pelvic tilt sitting EOB.   Balance Static Standing Balance Static Standing - Balance Support: Bilateral upper extremity supported;During functional activity Static Standing - Level of Assistance: 4: Min assist Static Standing - Comment/# of Minutes: 2 minutes with posterior lean initially and as pt fatigues.    End of Session PT - End of Session Equipment Utilized During Treatment: Gait belt;Cervical collar Activity Tolerance: Patient limited by pain;Patient limited by fatigue Patient left: in chair;with call bell/phone within reach;with family/visitor present Nurse Communication: Mobility status       INGOLD,Romaldo Saville 05/22/2013, 3:48 PM  [facility] [phone] (pager)

## 2013-05-22 NOTE — Plan of Care (Signed)
Problem: Consults Goal: Diagnosis - Spinal Surgery Cervical Spine Fusion  Problem: Phase II Progression Outcomes Goal: Verbalizes of donning/doffing brace Outcome: Completed/Met Date Met:  05/22/13 Aspen collar is to remain in place

## 2013-05-23 MED ORDER — HYDROCODONE-ACETAMINOPHEN 5-325 MG PO TABS: 1.0000 | ORAL_TABLET | ORAL | Status: DC | PRN

## 2013-05-23 NOTE — Progress Notes (Signed)
Report called to [facility] RN; patient ready for discharge back to their Rehab center today; patient's wife aware of discharge; discharge instructions reviewed with patient and [facility] RN.  Ambulance transport arranged; patient transferred to [facility]; belongings picked up by his wife at the bedside.

## 2013-05-23 NOTE — Progress Notes (Signed)
Patient is being D/C to [facility] facility today. Clinical Child psychotherapist (CSW) prepared D/C packet and faxed Fl2, discharge summary, and after visit summary to [facility] facility. Patient is being transported by [facility] and RN has made family aware of above. Nursing is aware of above. Please reconsult if further social work needs arise. CSW signing off.   Jetta Lout, LCSWA Weekend CSW [phone]

## 2013-05-23 NOTE — Discharge Summary (Signed)
Physician Discharge Summary  Patient ID: Jacob Rosales MRN: [id number] DOB/AGE: 1927/10/06 79 y.o.  Admit date: 05/21/2013 Discharge date: 05/23/2013  Admission Diagnoses: unstable C2 fracture    Discharge Diagnoses: same   Discharged Condition: stable  Hospital Course: The patient was admitted on 05/21/2013 and taken to the operating room where the patient underwent history cervical fusion for unstable C2 fracture. The patient tolerated the procedure well and was taken to the recovery room and then to the ICU in stable condition. The hospital course was routine. He was transferred to the floor on postop day 1.There were no complications. The wound remained clean dry and intact. Pt had appropriate neck soreness. No complaints of arm pain or new N/T/W. The patient remained afebrile with stable vital signs, and tolerated a regular diet. The patient continued to increase activities, and pain was well controlled with oral pain medications.   Consults: None  Significant Diagnostic Studies:  Results for orders placed during the hospital encounter of 05/21/13  SURGICAL PCR SCREEN      Result Value Range   MRSA, PCR NEGATIVE  NEGATIVE   Staphylococcus aureus NEGATIVE  NEGATIVE  BASIC METABOLIC PANEL      Result Value Range   Sodium 131 (*) 135 - 145 mEq/L   Potassium 4.5  3.5 - 5.1 mEq/L   Chloride 95 (*) 96 - 112 mEq/L   CO2 24  19 - 32 mEq/L   Glucose, Bld 91  70 - 99 mg/dL   BUN 31 (*) 6 - 23 mg/dL   Creatinine, Ser 4.09  0.50 - 1.35 mg/dL   Calcium 9.9  8.4 - 81.1 mg/dL   GFR calc non Af Amer 59 (*) >90 mL/min   GFR calc Af Amer 68 (*) >90 mL/min  CBC      Result Value Range   WBC 7.9  4.0 - 10.5 K/uL   RBC 4.59  4.22 - 5.81 MIL/uL   Hemoglobin 14.8  13.0 - 17.0 g/dL   HCT 91.4  78.2 - 95.6 %   MCV 92.4  78.0 - 100.0 fL   MCH 32.2  26.0 - 34.0 pg   MCHC 34.9  30.0 - 36.0 g/dL   RDW 21.3  08.6 - 57.8 %   Platelets 192  150 - 400 K/uL    Dg Chest 2 View  05/21/2013    *RADIOLOGY REPORT*  Clinical Data: Preop for cervical surgery.  CHEST - 2 VIEW  Comparison: Chest radiograph 11/21/2010  Findings:  AICD device overlies the left hemithorax, leads are stable in position.  Stable cardiac and mediastinal contours with calcification of the transverse aorta.  No consolidative pulmonary opacities.  No pleural effusion or pneumothorax.  No significant interval change in multiple compression deformities of the mid thoracic spine.  IMPRESSION: No acute cardiopulmonary process.   Original Report Authenticated By: Annia Belt, M.D   Dg Cervical Spine 1 View  05/21/2013   *RADIOLOGY REPORT*  Clinical Data: C1-C3 posterior cervical fusion  DG C-ARM 61-120 MIN  Findings: Single spot fluoroscopic view of the cervical spine in the lateral projection shows postoperative changes of posterior fusion spanning C1-C3.  Support tubes are noted.  Impression:  As above.   Original Report Authenticated By: Britta Mccreedy, M.D.   Dg Cervical Spine 2 Or 3 Views  05/04/2013   *RADIOLOGY REPORT*  Clinical Data: C2 fracture post fall.  CERVICAL SPINE - 2-3 VIEW  Comparison: CT from previous day  Findings: Again noted a comminuted fracture across  the base of the dens involving bilateral pedicles, slightly distracted, stable alignment since previous study.  Narrowing of the C3-4, C4-5, and C5-6 interspaces with small anterior and posterior endplate spurs. No prevertebral soft tissue swelling.  Previous median sternotomy. Transvenous pacing leads partially seen.  IMPRESSION: 1.  Stable positioning of the comminuted C2 fracture as above. 2.  Degenerative disc disease C3-C6.   Original Report Authenticated By: D. Andria Rhein, MD   Ct Head Wo Contrast  05/03/2013   CLINICAL DATA:  79 year old male with falls. Loss of balance. Abrasion. Right lateral neck pain.  EXAM: CT HEAD WITHOUT CONTRAST  CT CERVICAL SPINE WITHOUT CONTRAST  TECHNIQUE: Multidetector CT imaging of the head and cervical spine was performed  following the standard protocol without intravenous contrast. Multiplanar CT image reconstructions of the cervical spine were also generated.  COMPARISON:  Head CTs 01/13/2010 and earlier.  FINDINGS: CT HEAD FINDINGS  Visualized paranasal sinuses and mastoids are clear. No scalp hematoma. Visualized orbit soft tissues are within normal limits. Calvarium intact.  Stable cerebral volume. Ventricular size and configuration are within normal limits. No midline shift, mass effect, or evidence of intracranial mass lesion. No acute intracranial hemorrhage identified. Patchy in confluent white matter hypodensity not significantly changed. No evidence of cortically based acute infarction identified. No suspicious intracranial vascular hyperdensity.  CT CERVICAL SPINE FINDINGS  Osteopenia. The occipital condyles and C1 ring appear to remain intact.  Comminuted C2 body fracture with bilateral C2 pedicle fractures. Mild compression of the C2 body and slight posterior displacement of the posterior vertebral body fracture fragment. Small volume epidural hematoma suspected (series 3, image 31). The odontoid, lamina and C2 spinous process components remain intact.  Cervicothoracic junction alignment is within normal limits. Bilateral posterior element alignment is within normal limits. Multilevel advanced cervical disc space loss and endplate degeneration. No other acute cervical fracture identified. Grossly intact visualized upper thoracic levels.  Negative lung apices. Visualized paraspinal soft tissues are within normal limits.  IMPRESSION: CT HEAD IMPRESSION  No acute intracranial abnormality.  CT CERVICAL SPINE IMPRESSION  1. Comminuted C2 vertebral body and bilateral pedicle fracture. Mild displacement of fracture fragments and small volume epidural hematoma at the C2 level.  2. Osteopenia. No other acute cervical spine or skull base fracture identified.  CriticalValue/emergent results were called by telephone at the time of  interpretation on 05/03/2013 at 8:42 PMto Dr. Oleh Genin , who verbally acknowledged these results.   Electronically Signed   By: Augusto Gamble   On: 05/03/2013 20:45   Ct Cervical Spine Wo Contrast  05/03/2013   CLINICAL DATA:  79 year old male with falls. Loss of balance. Abrasion. Right lateral neck pain.  EXAM: CT HEAD WITHOUT CONTRAST  CT CERVICAL SPINE WITHOUT CONTRAST  TECHNIQUE: Multidetector CT imaging of the head and cervical spine was performed following the standard protocol without intravenous contrast. Multiplanar CT image reconstructions of the cervical spine were also generated.  COMPARISON:  Head CTs 01/13/2010 and earlier.  FINDINGS: CT HEAD FINDINGS  Visualized paranasal sinuses and mastoids are clear. No scalp hematoma. Visualized orbit soft tissues are within normal limits. Calvarium intact.  Stable cerebral volume. Ventricular size and configuration are within normal limits. No midline shift, mass effect, or evidence of intracranial mass lesion. No acute intracranial hemorrhage identified. Patchy in confluent white matter hypodensity not significantly changed. No evidence of cortically based acute infarction identified. No suspicious intracranial vascular hyperdensity.  CT CERVICAL SPINE FINDINGS  Osteopenia. The occipital condyles and C1 ring appear  to remain intact.  Comminuted C2 body fracture with bilateral C2 pedicle fractures. Mild compression of the C2 body and slight posterior displacement of the posterior vertebral body fracture fragment. Small volume epidural hematoma suspected (series 3, image 31). The odontoid, lamina and C2 spinous process components remain intact.  Cervicothoracic junction alignment is within normal limits. Bilateral posterior element alignment is within normal limits. Multilevel advanced cervical disc space loss and endplate degeneration. No other acute cervical fracture identified. Grossly intact visualized upper thoracic levels.  Negative lung apices. Visualized  paraspinal soft tissues are within normal limits.  IMPRESSION: CT HEAD IMPRESSION  No acute intracranial abnormality.  CT CERVICAL SPINE IMPRESSION  1. Comminuted C2 vertebral body and bilateral pedicle fracture. Mild displacement of fracture fragments and small volume epidural hematoma at the C2 level.  2. Osteopenia. No other acute cervical spine or skull base fracture identified.  CriticalValue/emergent results were called by telephone at the time of interpretation on 05/03/2013 at 8:42 PMto Dr. Oleh Genin , who verbally acknowledged these results.   Electronically Signed   By: Augusto Gamble   On: 05/03/2013 20:45   Dg C-arm 61-120 Min  05/21/2013   *RADIOLOGY REPORT*  Clinical Data: C1-C3 posterior cervical fusion  DG C-ARM 61-120 MIN  Findings: Single spot fluoroscopic view of the cervical spine in the lateral projection shows postoperative changes of posterior fusion spanning C1-C3.  Support tubes are noted.  Impression:  As above.   Original Report Authenticated By: Britta Mccreedy, M.D.    Antibiotics:  Anti-infectives   Start     Dose/Rate Route Frequency Ordered Stop   05/22/13 0600  ceFAZolin (ANCEF) IVPB 2 g/50 mL premix     2 g 100 mL/hr over 30 Minutes Intravenous On call to O.R. 05/21/13 1301 05/21/13 1727   05/22/13 0100  ceFAZolin (ANCEF) IVPB 1 g/50 mL premix     1 g 100 mL/hr over 30 Minutes Intravenous Every 8 hours 05/21/13 2138 05/22/13 0914      Discharge Exam: Blood pressure 126/56, pulse 70, temperature 97.3 F (36.3 C), temperature source Oral, resp. rate 20, height 5\' 10"  (1.778 m), weight 62.596 kg (138 lb), SpO2 97.00%. Neurologic: Grossly normal Incision clean and intact  Discharge Medications:     Medication List         CALCIUM 600 + D PO  Take 1 tablet by mouth 2 (two) times daily.     carvedilol 25 MG tablet  Commonly known as:  COREG  Take 25 mg by mouth 2 (two) times daily with a meal.     cholecalciferol 1000 UNITS tablet  Commonly known as:  VITAMIN  D  Take 1,000 Units by mouth daily.     dipyridamole-aspirin 200-25 MG per 12 hr capsule  Commonly known as:  AGGRENOX  Take one tablet twice daily to reduce stroke     HYDROcodone-acetaminophen 5-325 MG per tablet  Commonly known as:  NORCO/VICODIN  Take 1 tablet by mouth every 4 (four) hours as needed for pain. TAke 1 tablet three times daily (8am, 4pm, 10pm) , may have additional tablet every 6 hours as needed for pain management     ipratropium 0.06 % nasal spray  Commonly known as:  ATROVENT  Place 2 sprays into the nose 4 (four) times daily.     lisinopril 20 MG tablet  Commonly known as:  PRINIVIL,ZESTRIL  Take 20 mg by mouth every evening.     LORazepam 1 MG tablet  Commonly known as:  ATIVAN  Take 1 mg by mouth at bedtime as needed (for sleep).     methocarbamol 500 MG tablet  Commonly known as:  ROBAXIN  Take 500 mg by mouth 2 (two) times daily.     senna-docusate 8.6-50 MG per tablet  Commonly known as:  Senokot-S  Take 1 tablet by mouth daily.     simvastatin 20 MG tablet  Commonly known as:  ZOCOR  Take 20 mg by mouth at bedtime.     SYSTANE 0.4-0.3 % Soln  Generic drug:  Polyethyl Glycol-Propyl Glycol  Place 1 drop into both eyes every 4 (four) hours as needed (for dry eyes).     triamterene-hydrochlorothiazide 37.5-25 MG per capsule  Commonly known as:  DYAZIDE  Take 1 capsule by mouth every morning.        [facility]   Final Dx: cervical fusion for C2 fracture      Discharge Orders   Future Appointments Provider Department Dept Phone   06/28/2013 8:00 AM [facility] [employer] Main Office [place]) [phone]   10/11/2013 2:30 PM Kimber Relic, MD [facility] [phone]   Future Orders Complete By Expires   Call MD for:  difficulty breathing, headache or visual disturbances  As directed    Call MD for:  hives  As directed    Call MD for:  persistant nausea and vomiting  As  directed    Call MD for:  redness, tenderness, or signs of infection (pain, swelling, redness, odor or green/yellow discharge around incision site)  As directed    Call MD for:  severe uncontrolled pain  As directed    Call MD for:  temperature >100.4  As directed    Diet - low sodium heart healthy  As directed    Discharge instructions  As directed    Comments:     No strenuous activity, no heavy lifting, must wear collar at all times. May shower normally   Increase activity slowly  As directed       Follow-up Information   Follow up with POOL,HENRY A, MD. Schedule an appointment as soon as possible for a visit in 2 weeks.   Specialty:  Neurosurgery   Contact information:   [address] [place] [address] [phone]        Signed: Tia Alert 05/23/2013, 10:35 AM

## 2013-05-23 NOTE — Progress Notes (Signed)
OT Cancellation Note  Patient Details Name: Jacob Rosales MRN: [id number] DOB: [birth year]   Cancelled Treatment:    Reason Eval/Treat Not Completed: Other (comment). Pt d/c'ing to SNF this afternoon per Child psychotherapist. Will defer OT services to next venue.  05/23/2013 Cipriano Mile OTR/L Pager [phone] Office [phone]

## 2013-05-24 ENCOUNTER — Encounter: Payer: Self-pay | Admitting: Geriatric Medicine

## 2013-05-24 ENCOUNTER — Non-Acute Institutional Stay (SKILLED_NURSING_FACILITY): Payer: Medicare Other | Admitting: Geriatric Medicine

## 2013-05-24 DIAGNOSIS — I10 Essential (primary) hypertension: Secondary | ICD-10-CM

## 2013-05-24 DIAGNOSIS — K5900 Constipation, unspecified: Secondary | ICD-10-CM

## 2013-05-24 DIAGNOSIS — IMO0002 Reserved for concepts with insufficient information to code with codable children: Secondary | ICD-10-CM

## 2013-05-24 DIAGNOSIS — S12100D Unspecified displaced fracture of second cervical vertebra, subsequent encounter for fracture with routine healing: Secondary | ICD-10-CM

## 2013-05-24 NOTE — Progress Notes (Signed)
Patient ID: Jacob Rosales, male   DOB: 08/26/1928, 79 y.o.   MRN: [id number] [facility] [phone])  Code Status: DNR Contact Information   Name Relation Home Work Mobile   Sellick,[name] Spouse [phone]     [name],[name] Daughter [phone]        Chief Complaint  Patient presents with  . Hospitalization Follow-up    Cervical fusion    HPI: This 79 y.o. male resident of [facility] Independent Living Section was admitted to Rehab section 05/06/2013.  He had a fall 05/03/13 with resultant neck pain. Evaluation in the emergency department included CT scan of the cervical spine which demonstrated a coronal fracture of the body of C2 without significant displacement. Patient was evaluated by Dr. Julio Sicks; recommended neck immobilization in collar, mobilize with physical therapy. Repeat cervical spine film on September 2 revealed stable fracture, patient was discharged home, instructed to take Tylenol for pain.  Patient was evaluated in Clinic 9/3 due to severe pain in his neck and his head, having difficult time with mobility in his home, requiring spouse to assist him.  After some discussion patient reported that he felt like something "popped" when he tried to get out of bed.  An X-ray was obtained, no new fracture identified. Patient was RX hydrocodone/acetaminophen for pain management. He continued to have difficulty with ADLs/mobilizing, was admitted to Rehab section when a room became available.  Patient continued to have significant pain, relieved somewhat with improovment in posture along with scheduled hydrocodone/Robaxin. Patient followed up as scheduled with Dr.Pool August 18. Repeat CT scan of the neck revealed an unstable C2 fracture. Dr. Lindalou Hose assessment: coronal plane C2 fracture with severe subluxation and angulation causing marked spinal stenosis and deformity. Discussed options of available for treatment,  recommended posterior cervical fusion with  lateral mass instrumentation from C1-C3. The patient was admitted to hospital on September 19 and underwent surgery the same day. Review of record shows surgical procedure and immediate postoperative period were uneventful. His neck remained immobilized with a cervical collar, pain managed adequately with p.o. medication. Patient was discharged back to the Rehabilitation section at [facility] on September 21 with instructions for no strenuous activity, increase activity slowly,  no heavy lifting, must wear collar at all times.  Nursing staff reports the patient does not seem to be getting adequate pain relief; he is watching the clock for his next pain pill. Patient reports his pain level at 8-9 at the worst at best 5-6. He is very focused on what the next pain pill is available in being sure that he is given as many pain pills as is prescribed.   Bathing: NA Bed Mobility: Maximal Assist Bladder Management: Continent, Bowel Management: Continent Feeding: Moderate Independence Hygiene and Grooming: Minimal Assist, Toileting / Hygiene:  Minimal Assist Transfers: Minimal Assist, Walk: Minimal Assist    Allergies  Allergen Reactions  . Warfarin Sodium Other (See Comments)    REACTION: Stomach bleeding  . Amlodipine Besylate Swelling   Medications Reviewed  DATA REVIEWED  Radiologic Exams  05/04/2013:  CT HEAD IMPRESSION: No acute intracranial abnormality.   CT CERVICAL SPINE IMPRESSION:     1. Comminuted C2 vertebral body and bilateral pedicle fracture. Mild displacement of fracture fragments and small volume epidural hematoma at the C2 level.   2. Osteopenia. No other acute cervical spine or skull base fracture identified.  [facility] X-ray  05/05/2013 Cervical Spine: Limited views- no acute fracture noted   Cardiovascular Exams:   Laboratory Studies  [facility] 09/29/2012 glucose 82, BUN 23, creatinine 1.39, sodium 138, potassium 4.4.  Protein/LFTs WNL             Total cholesterol 143, triglycerides 72, HDL 52, LDL 77 03/30/2013 PSA .85                          [facility] 05/03/2013 hemoglobin 14.3, hematocrit 42.0             Glucose 116, BUN 20, creatinine 1.4, sodium 132, potassium 4.7  Lab Results  Component Value Date   WBC 7.9 05/21/2013   HGB 14.8 05/21/2013   HCT 42.4 05/21/2013   PLT 192 05/21/2013        GLUCOSE 91 05/21/2013   NA 131* 05/21/2013   K 4.5 05/21/2013   CL 95* 05/21/2013   CREATININE 1.12 05/21/2013   BUN 31* 05/21/2013   CO2 24 05/21/2013    Past Medical History  Diagnosis Date  . Complete AV block   . Chronic systolic heart failure   . Cardiomyopathy, ischemic   . Automatic implantable cardiac defibrillator in situ   . CAD (coronary artery disease) of artery bypass graft   . Prostate cancer     S/P radiation rx  . Osteoporosis     A/P Vertebral compression fx's  . Hypertension   . Dyslipidemia   . TIA (transient ischemic attack)     H/o  . Lumbago 10/05/2012  . Cervicalgia 04/06/2012  . 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 Unspecified vitamin D deficiency 10/07/2011  . Unspecified pruritic disorder 10/07/2011  . Rash and other nonspecific skin eruption 10/07/2011  . Basal cell carcinoma of skin of lower limb, including hip 04/08/2011  . Squamous cell carcinoma of skin of lower limb, including hip 04/08/2011  . Unspecified sinusitis (chronic) 10/08/2010  . Impotence of organic origin 10/08/2010  . Seborrhea 04/09/2010  . Insomnia, unspecified 04/09/2010  . Altered mental status 01/12/2010  . Full incontinence of feces 10/09/2009  . Atrial fibrillation 05/15/2006  . Unspecified urinary incontinence 05/15/2006  . Sebaceous cyst 10/09/2005  . Pathologic fracture of vertebrae 05/16/1991  . Fracture of C2 vertebra, closed     s/p cervical fusion 05/2013   Past Surgical History  Procedure Laterality Date  . Coronary artery bypass graft    . Hernia repair    . Kyphosis surgery    .  Biv-icd  11/21/2010    [employer] ICD Model#[id number] [phone]  . Cardiac defibrillator placement    . Cyst excision  10/2012    back Dr. Karlyn Agee  . Cyst removal neck  12/2012    basel cell (right) Dr. Irene Limbo  . Colonoscopy  2006  . Dexa  April 2010  . Posterior cervical fusion/foraminotomy N/A 05/21/2013    Procedure: Cervical One, Cervical Two, Cervical Three Posterior cervical fusion with lateral mass fixation;  Surgeon: Temple Pacini, MD;  Location: [facility] OR;  Service: Neurosurgery;  Laterality: N/A;  POSTERIOR CERVICAL FUSION/FORAMINOTOMY LEVEL 2   History   Social History Narrative   Patient is Married Carney Bern), retired Art gallery manager. [facility].   Smoking cigarettes minimal alcohol history .   Patient has Advanced planning documents:  Living Will, DNR  Review of Systems  DATA OBTAINED: from patient, nurse, medical record GENERAL: Feels "OK"   No fevers, fatigue, appetite  improving SKIN: No itch, rash or open wounds EYES: No eye pain, dryness or itching  No change in vision EARS: No earache, tinnitus, change in hearing NOSE: No congestion, drainage or bleeding MOUTH/THROAT: No mouth or tooth pain  No sore throat    No difficulty chewing or swallowing RESPIRATORY: No cough, wheezing, SOB CARDIAC: No chest pain, palpitations  No edema. GI: No abdominal pain  No N/V/D. Constipated  No heartburn or reflux  GU: No dysuria, increased frequency or urgency  No change in urine volume or character MUSCULOSKELETAL: Posterior neck and scalp pain  No joint pain, swelling or stiffness o muscle ache, pain, weakness  Gait is unsteady  ls.  NEUROLOGIC: No dizziness, fainting, numbness, tinging.  Mild headache. No change in mental status.  PSYCHIATRIC: Signs of anxiety,   No behavior issue.    Physical Exam Filed Vitals:   05/24/13 1531  BP: 163/92  Pulse: 81  Temp: 97 F (36.1 C)  Weight: 140 lb 3.2 oz  (63.594 kg)  SpO2: 96%   Body mass index is 20.12 kg/(m^2).  GENERAL APPEARANCE: No acute distress, appropriately groomed, Thin body habitus. Alert, pleasant, conversant. SKIN: No diaphoresis, rash, unusual lesions, wounds HEAD: Normocephalic, atraumatic EYES: Conjunctiva/lids clear. Pupils round, reactive. Marland Kitchen  EARS:  Hearing grossly normal. NOSE: No deformity or discharge. MOUTH/THROAT: Lips w/o lesions. Oral mucosa, tongue moist, w/o lesion. Oropharynx w/o redness or lesions.  NECK: Immobilized in a cervical collar LYMPHATICS: No head, neck or supraclavicular adenopathy RESPIRATORY: Breathing is even, unlabored. Lung sounds are clear and full.  CARDIOVASCULAR: Heart RRR. No murmur or extra heart sounds  EDEMA: No peripheral or periorbital edema. No ascites GASTROINTESTINAL: Abdomen is soft, non-tender, not distended w/ decreased bowel sounds. No hepatic or splenic enlargement.  MUSCULOSKELETAL: Moves all extremities with full ROM, strength and tone. Back with kyphosis. No scoliosis or spinal process tenderness. Gait is not assessed today NEUROLOGIC: Oriented to time, place, person. Cranial nerves 2-12 grossly intact, speech clear, no tremor.  PSYCHIATRIC: Mood and affect appropriate to situation. Very focused on pain management; "when is next pain pill?"  ASSESSMENT/PLAN  C2 cervical fracture Injury 05/03/2013, immobilized in a cervical collar, followup showed worsening of cervical vertebrae deformity. Patient is now status post posterior cervical fusion. Pain management is not adequate with current medication will increase dose of hydrocodone. For physical and occupational therapies will resume today to help patient regain strength and  independent functional status.  Hypertension Stable with current medications. Recent lab studies satisfactory  Unspecified constipation Sluggish bowels 2 to opioid medication and limited mobility. Increase Senokot dose.    Follow up: As  needed  Wah Sabic T.Ciel Yanes, NP-C 05/24/2013

## 2013-05-25 ENCOUNTER — Encounter (HOSPITAL_COMMUNITY): Payer: Self-pay | Admitting: Neurosurgery

## 2013-05-27 ENCOUNTER — Non-Acute Institutional Stay (SKILLED_NURSING_FACILITY): Payer: Medicare Other | Admitting: Geriatric Medicine

## 2013-05-27 DIAGNOSIS — F411 Generalized anxiety disorder: Secondary | ICD-10-CM

## 2013-05-27 NOTE — Progress Notes (Signed)
Patient ID: Jacob Rosales, male   DOB: 1928/06/26, 79 y.o.   MRN: [id number] [facility] [phone])  Code Status: DNR Contact Information   Name Relation Home Work Mobile   Barraclough,[name] Spouse [phone]     [name],[name] Daughter [phone]        Chief Complaint  Patient presents with  . Anxiety    HPI: This 79 y.o. male resident of [facility] was admitted to Rehab section 05/06/2013.  He had a fall 05/03/13 with resultant neck pain. Evaluation in the emergency department included CT scan of the cervical spine which demonstrated a coronal fracture of the body of C2 without significant displacement. Patient was evaluated by Dr. Julio Sicks; recommended neck immobilization in collar, mobilize with physical therapy. Repeat cervical spine film on September 2 revealed stable fracture, patient was discharged home, instructed to take Tylenol for pain.  Patient was evaluated in Clinic 9/3 due to severe pain in his neck and his head, having difficult time with mobility in his home, requiring spouse to assist him.  After some discussion patient reported that he felt like something "popped" when he tried to get out of bed.  An X-ray was obtained, no new fracture identified. Patient was RX hydrocodone/acetaminophen for pain management. He continued to have difficulty with ADLs/mobilizing, was admitted to Rehab section when a room became available.  Patient continued to have significant pain, relieved somewhat with improovment in posture along with scheduled hydrocodone/Robaxin. Patient followed up as scheduled with Dr.Pool August 18. Repeat CT scan of the neck revealed an unstable C2 fracture. Dr. Lindalou Hose assessment: coronal plane C2 fracture with severe subluxation and angulation causing marked spinal stenosis and deformity. Discussed options of available for treatment,  recommended posterior cervical fusion with lateral mass instrumentation from  C1-C3. The patient was admitted to hospital on September 19 and underwent surgery the same day. Review of record shows surgical procedure and immediate postoperative period were uneventful.  Patient was discharged back to the Rehabilitation section at [facility] on September 21 with instructions for no strenuous activity, increase activity slowly,  no heavy lifting, must wear neck collar at all times.   At last visit, patient does not appear to be getting adequate pain relief; hydrocodone dose increased.  Patient reports his pain relief is probably better.  Nurse requested evaluation today as this patient is "not sleeping". Nursing staff reports increased anxiety exhibited by calling for assistance very frequently, "always on call bell". And increased confusion. He called his spouse several times last night thinking it was morning. The patient tells me his usual pattern is up to the toilet fortified times a night. He does admit that he has been disoriented when he wakes up     Allergies  Allergen Reactions  . Warfarin Sodium Other (See Comments)    REACTION: Stomach bleeding  . Amlodipine Besylate Swelling   Medications reviewed  DATA REVIEWED  Radiologic Exams 05/04/2013:   CT HEAD IMPRESSION: No acute intracranial abnormality.   CT CERVICAL SPINE IMPRESSION:     1. Comminuted C2 vertebral body and bilateral pedicle fracture. Mild displacement of fracture fragments and small volume epidural hematoma at the C2 level.   2. Osteopenia. No other acute cervical spine or skull base fracture identified.                             [facility]  05/05/2013 Cervical Spine: Limited views- no  acute fracture noted   Cardiovascular Exams:   Laboratory Studies [facility] 09/29/2012 glucose 82, BUN 23, creatinine 1.39, sodium 138, potassium 4.4. Protein/LFTs WNL             Total cholesterol 143, triglycerides 72, HDL 52, LDL 77 03/30/2013 PSA .85                          [facility]  Lab 05/03/2013 hemoglobin 14.3, hematocrit 42.0             Glucose 116, BUN 20, creatinine 1.4, sodium 132, potassium 4.7      Review of Systems  DATA OBTAINED: from patient, nurse, medical record GENERAL: Feels "OK"   No fevers, fatigue, appetite  improving SKIN: No itch, rash or open wounds EYES: No eye pain, dryness or itching  No change in vision EARS: No earache, tinnitus, change in hearing NOSE: No congestion, drainage or bleeding MOUTH/THROAT: No mouth or tooth pain  No sore throat    No difficulty chewing or swallowing RESPIRATORY: No cough, wheezing, SOB CARDIAC: No chest pain, palpitations  No edema. GI: No abdominal pain  No N/V/D. Constipated  No heartburn or reflux   GU: No dysuria, increased frequency or urgency  No change in urine volume or character MUSCULOSKELETAL: Posterior neck and scalp pain  No joint pain, swelling or stiffness o muscle ache, pain, weakness  Gait is unsteady ls.  NEUROLOGIC: No dizziness, fainting, numbness, tinging.  Mild headache. No change in mental status.   PSYCHIATRIC: Signs of anxiety,   No behavior issue.   Physical Exam Filed Vitals:   05/27/13 2012  BP: 164/88  Pulse: 77  Temp: 96.6 F (35.9 C)  SpO2: 95%   GENERAL APPEARANCE: No acute distress, appropriately groomed, Thin body habitus. Alert, pleasant, conversant. SKIN: No diaphoresis, rash, unusual lesions, wounds HEAD: Normocephalic, atraumatic EYES: Conjunctiva/lids clear. Pupils round, reactive. Marland Kitchen   EARS:  Hearing grossly normal. NOSE: No deformity or discharge. MOUTH/THROAT: Lips w/o lesions. Oral mucosa, tongue moist, w/o lesion. Oropharynx w/o redness or lesions.   NECK: Immobilized in a cervical collar LYMPHATICS: No head, neck or supraclavicular adenopathy RESPIRATORY: Breathing is even, unlabored. Lung sounds are clear and full.   CARDIOVASCULAR: Heart RRR. No murmur or extra heart sounds             EDEMA: No peripheral or periorbital edema. No  ascites GASTROINTESTINAL: Abdomen is soft, non-tender, not distended w/ decreased bowel sounds. No hepatic or splenic enlargement.  MUSCULOSKELETAL: Moves all extremities with full ROM, strength and tone. Back with kyphosis. No scoliosis or spinal process tenderness. Gait is not assessed today NEUROLOGIC: Oriented to time, place, person. Cranial nerves 2-12 grossly intact, speech clear, no tremor.     ASSESSMENT/PLAN  Generalized anxiety disorder This patient's baseline mood/ behavior is regimented/ controlled. This episode is very difficult. Hydrocodone is contributing to disorientation during the night; he will require frequent re orientation. Staff will expect frequent calls fro BR visits during the night. Do not recommend adding anti anxiety medication as it likely will add to disorientation     Follow up: As needed  Derryck Shahan T.Purl Claytor, NP-C 05/27/2013

## 2013-05-28 ENCOUNTER — Encounter: Payer: Self-pay | Admitting: Geriatric Medicine

## 2013-05-28 DIAGNOSIS — K5900 Constipation, unspecified: Secondary | ICD-10-CM | POA: Insufficient documentation

## 2013-05-28 NOTE — Assessment & Plan Note (Signed)
Injury 05/03/2013, immobilized in a cervical collar, followup showed worsening of cervical vertebrae deformity. Patient is now status post posterior cervical fusion. Pain management is not adequate with current medication will increase dose of hydrocodone. For physical and occupational therapies will resume today to help patient regain strength and  independent functional status.

## 2013-05-28 NOTE — Assessment & Plan Note (Signed)
Sluggish bowels 2 to opioid medication and limited mobility. Increase Senokot dose.

## 2013-05-28 NOTE — Assessment & Plan Note (Signed)
Stable with current medications. Recent lab studies satisfactory

## 2013-05-30 ENCOUNTER — Encounter: Payer: Self-pay | Admitting: Geriatric Medicine

## 2013-05-30 DIAGNOSIS — F411 Generalized anxiety disorder: Secondary | ICD-10-CM | POA: Insufficient documentation

## 2013-05-30 NOTE — Assessment & Plan Note (Signed)
This patient's baseline mood/ behavior is regimented/ controlled. This episode is very difficult. Hydrocodone is contributing to disorientation during the night; he will require frequent re orientation. Staff will expect frequent calls fro BR visits during the night. Do not recommend adding anti anxiety medication as it likely will add to disorientation

## 2013-06-07 ENCOUNTER — Non-Acute Institutional Stay (SKILLED_NURSING_FACILITY): Payer: Medicare Other | Admitting: Geriatric Medicine

## 2013-06-07 ENCOUNTER — Encounter: Payer: Self-pay | Admitting: Geriatric Medicine

## 2013-06-07 DIAGNOSIS — IMO0002 Reserved for concepts with insufficient information to code with codable children: Secondary | ICD-10-CM

## 2013-06-07 DIAGNOSIS — S12100D Unspecified displaced fracture of second cervical vertebra, subsequent encounter for fracture with routine healing: Secondary | ICD-10-CM

## 2013-06-07 NOTE — Progress Notes (Signed)
Patient ID: Jacob Rosales, male   DOB: [birth year], 79 y.o.   MRN: [id number] [facility] [phone])  Code Status: DNR Contact Information   Name Relation Home Work Mobile   Cue,[name] Spouse [phone]     [name],[name] Daughter [phone]        Chief Complaint  Patient presents with  . Cervical fracture    S/P Fusion C2    HPI: This 79 y.o. male resident of [facility] was admitted to Rehab section 05/06/2013.  He had a fall 05/03/13 with resultant neck pain. Evaluation in the emergency department included CT scan of the cervical spine which demonstrated a coronal fracture of the body of C2 without significant displacement. Patient was evaluated by Dr. Julio Sicks; recommended neck immobilization in collar, mobilize with physical therapy. Repeat cervical spine film on September 2 revealed stable fracture, patient was discharged home, instructed to take Tylenol for pain.  Patient was evaluated in Clinic 9/3 due to severe pain in his neck and his head, having difficult time with mobility in his home, requiring spouse to assist him.  After some discussion patient reported that he felt like something "popped" when he tried to get out of bed.  An X-ray was obtained, no new fracture identified. Patient was RX hydrocodone/acetaminophen for pain management. He continued to have difficulty with ADLs/mobilizing, was admitted to Rehab section when a room became available.  Patient continued to have significant pain, relieved somewhat with improovment in posture along with scheduled hydrocodone/Robaxin. Patient followed up as scheduled with Dr.Pool August 18. Repeat CT scan of the neck revealed an unstable C2 fracture. Dr. Lindalou Hose assessment: coronal plane C2 fracture with severe subluxation and angulation causing marked spinal stenosis and deformity. Discussed options of available for treatment,  recommended posterior cervical fusion with lateral  mass instrumentation from C1-C3. The patient was admitted to hospital on September 19 and underwent surgery the same day. Review of record shows surgical procedure and immediate postoperative period were uneventful.  Patient was discharged back to the Rehabilitation section at [facility] on September 21 with instructions for no strenuous activity, increase activity slowly,  no heavy lifting, must wear neck collar at all times.   Patient is making some progress with mobility, he's mobilizing in manual wheelchair, continues to need assistance for transfers. He is assisting with his own dressing and some hygiene. A home visit with occupational therapy is planned for October 7. He continues to work with physical therapy; balance is a problem. He's been identified with a right foot drop, an AFO has been ordered.  Pain continues to be a limiting factor for this patient, he continues with p.r.n. dosing in addition to scheduled medication. Staff does report he is sleeping better at night.   Allergies  Allergen Reactions  . Warfarin Sodium Other (See Comments)    REACTION: Stomach bleeding  . Amlodipine Besylate Swelling   Medications reviewed  DATA REVIEWED  Radiologic Exams 05/04/2013:   CT HEAD IMPRESSION: No acute intracranial abnormality.   CT CERVICAL SPINE IMPRESSION:     1. Comminuted C2 vertebral body and bilateral pedicle fracture. Mild displacement of fracture fragments and small volume epidural hematoma at the C2 level.   2. Osteopenia. No other acute cervical spine or skull base fracture identified.                             [facility]  05/05/2013 Cervical Spine:  Limited views- no acute fracture noted   Cardiovascular Exams:   Laboratory Studies [facility] 09/29/2012 glucose 82, BUN 23, creatinine 1.39, sodium 138, potassium 4.4. Protein/LFTs WNL             Total cholesterol 143, triglycerides 72, HDL 52, LDL 77 03/30/2013 PSA .85                          [facility]  Lab 05/03/2013 hemoglobin 14.3, hematocrit 42.0             Glucose 116, BUN 20, creatinine 1.4, sodium 132, potassium 4.7  05/21/2013 WBC 7.9, hemoglobin 14.8, hematocrit 42.4, platelets 192  Glucose 91, BUN 31, creatinine 0.2, sodium 131, potassium 4.5      Review of Systems  DATA OBTAINED: from patient, nurse, medical record GENERAL: Feels "OK"   No fevers, fatigue, appetite  improving SKIN: No itch, rash or open wounds EYES: No eye pain, dryness or itching  No change in vision EARS: No earache, tinnitus, change in hearing NOSE: No congestion, drainage or bleeding MOUTH/THROAT: No mouth or tooth pain  No sore throat    No difficulty chewing or swallowing RESPIRATORY: No cough, wheezing, SOB CARDIAC: No chest pain, palpitations  No edema. GI: No abdominal pain  No N/V/D. Constipated  No heartburn or reflux   GU: No dysuria, increased frequency or urgency  No change in urine volume or character MUSCULOSKELETAL: Posterior neck and scalp pain  No joint pain, swelling or stiffness o muscle ache, pain, weakness  Gait is unsteady ls.  NEUROLOGIC: No dizziness, fainting, numbness, tinging.  Mild headache. No change in mental status.   PSYCHIATRIC: Signs of anxiety,   No behavior issue.   Physical Exam Filed Vitals:   06/07/13 1536  BP: 116/68  Pulse: 69  Weight: 136 lb 6.4 oz (61.871 kg)   GENERAL APPEARANCE: No acute distress, appropriately groomed, Thin body habitus. Alert, pleasant, conversant. SKIN: No diaphoresis, rash, unusual lesions, wounds HEAD: Normocephalic, atraumatic EYES: Conjunctiva/lids clear. Pupils round, reactive. Marland Kitchen   EARS:  Hearing grossly normal. NOSE: No deformity or discharge. MOUTH/THROAT: Lips w/o lesions. Oral mucosa, tongue moist, w/o lesion. Oropharynx w/o redness or lesions.   NECK: Immobilized in a cervical collar LYMPHATICS: No head, neck or supraclavicular adenopathy RESPIRATORY: Breathing is even, unlabored. Lung sounds are clear and full.    CARDIOVASCULAR: Heart RRR. No murmur or extra heart sounds             EDEMA: No peripheral or periorbital edema. No ascites GASTROINTESTINAL: Abdomen is soft, non-tender, not distended w/ decreased bowel sounds. No hepatic or splenic enlargement.  MUSCULOSKELETAL: Moves all extremities with full ROM, strength and tone. Back with kyphosis. No scoliosis or spinal process tenderness. Gait is not assessed today NEUROLOGIC: Oriented to time, place, person. Cranial nerves 2-12 grossly intact, speech clear, no tremor.     ASSESSMENT/PLAN  C2 cervical fracture Patient continues progress in recovering from C2 fracture status post posterior cervical fusion. Continue OT and PT efforts to maximize functional status. Balance remains on a safety issue he will not be able to discharge home until this is improved significantly. Patient is due for followup with Dr. Jordan Likes later this week    Follow up: As needed  Cashae Weich T.Alexandrina Fiorini, NP-C 06/07/2013

## 2013-06-14 ENCOUNTER — Non-Acute Institutional Stay (SKILLED_NURSING_FACILITY): Payer: Medicare Other | Admitting: Geriatric Medicine

## 2013-06-14 ENCOUNTER — Encounter: Payer: Self-pay | Admitting: Geriatric Medicine

## 2013-06-14 DIAGNOSIS — S12100D Unspecified displaced fracture of second cervical vertebra, subsequent encounter for fracture with routine healing: Secondary | ICD-10-CM

## 2013-06-14 DIAGNOSIS — IMO0002 Reserved for concepts with insufficient information to code with codable children: Secondary | ICD-10-CM

## 2013-06-14 NOTE — Assessment & Plan Note (Signed)
Patient continues progress in recovering from C2 fracture status post posterior cervical fusion. Continue OT and PT efforts to maximize functional status. Balance remains on a safety issue he will not be able to discharge home until this is improved significantly. Patient is due for followup with Dr. Jordan Likes later this week

## 2013-06-14 NOTE — Progress Notes (Signed)
Patient ID: Jacob Rosales, male   DOB: 03-10-1928, 79 y.o.   MRN: [id number] [facility] (31)  Code Status: DNR Contact Information   Name Relation Home Work Mobile   Geeslin,[name] Spouse [phone]     [name],[name] Daughter [phone]        Chief Complaint  Patient presents with  . cervical fracture    HPI: This 79 y.o. male resident of [facility] was admitted to Rehab section 05/06/2013.  He had a fall 05/03/13 with resultant neck pain. Evaluation in the emergency department included CT scan of the cervical spine which demonstrated a coronal fracture of the body of C2 without significant displacement. Patient was evaluated by Dr. Julio Sicks; recommended neck immobilization in collar, mobilize with physical therapy. Repeat cervical spine film on September 2 revealed stable fracture, patient was discharged home, instructed to take Tylenol for pain.  Patient was evaluated in Clinic 9/3 due to severe pain in his neck and his head, having difficult time with mobility in his home, requiring spouse to assist him.  After some discussion patient reported that he felt like something "popped" when he tried to get out of bed.  An X-ray was obtained, no new fracture identified. Patient was RX hydrocodone/acetaminophen for pain management. He continued to have difficulty with ADLs/mobilizing, was admitted to Rehab section when a room became available.  Patient continued to have significant pain, relieved somewhat with improvement in posture along with scheduled hydrocodone/Robaxin. Patient followed up as scheduled with Dr.Pool August 18. Repeat CT scan of the neck revealed an unstable C2 fracture. Dr. Lindalou Hose assessment: coronal plane C2 fracture with severe subluxation and angulation causing marked spinal stenosis and deformity. Discussed options of available for treatment,  recommended posterior cervical fusion with lateral mass  instrumentation from C1-C3. The patient was admitted to hospital on September 19 and underwent surgery the same day. Review of record shows surgical procedure and immediate postoperative period were uneventful.  Patient was discharged back to the Rehabilitation section at [facility] on September 21 with instructions for no strenuous activity, increase activity slowly,  no heavy lifting, must wear neck collar at all times.   Last Visit: C2 cervical fracture  Patient continues progress in recovering from C2 fracture status post posterior cervical fusion. Continue OT and PT efforts to maximize functional status. Balance remains on a safety issue he will not be able to discharge home until this is improved significantly. Patient is due for followup with Dr. Jordan Likes later this week.  Patient returned to Dr.Pool as scheduled, x-ray is satisfactory, will stay in neck brace 8-10 weeks. Needs to keep chin positioned correctly in brace.  Patient has been reducing amount of pain medication as he is having less general discomfort. PT/OT report he is making good progress with ambulation and ADLs. Balance continues to be a concern, is improving. Left foot drop is contributing to difficulty with ambulation.  Home safety visit was completed, minor home modifications are in process.    Allergies  Allergen Reactions  . Warfarin Sodium Other (See Comments)    REACTION: Stomach bleeding  . Amlodipine Besylate Swelling   Medications reviewed  DATA REVIEWED  Radiologic Exams 05/04/2013:   CT HEAD IMPRESSION: No acute intracranial abnormality.   CT CERVICAL SPINE IMPRESSION:     1. Comminuted C2 vertebral body and bilateral pedicle fracture. Mild displacement of fracture fragments and small volume epidural hematoma at the C2 level.   2. Osteopenia. No other acute cervical  spine or skull base fracture identified.                             [facility]  05/05/2013 Cervical Spine: Limited views- no  acute fracture noted   [facility] 06/10/2013 cervical spine x-ray: Stable appearance of posterior cervical fusion with instrumentation. Overall alignment is maintained  Cardiovascular Exams:   Laboratory Studies [facility] 09/29/2012 glucose 82, BUN 23, creatinine 1.39, sodium 138, potassium 4.4. Protein/LFTs WNL             Total cholesterol 143, triglycerides 72, HDL 52, LDL 77 03/30/2013 PSA .85                          [facility] 05/03/2013 hemoglobin 14.3, hematocrit 42.0             Glucose 116, BUN 20, creatinine 1.4, sodium 132, potassium 4.7  05/21/2013 WBC 7.9, hemoglobin 14.8, hematocrit 42.4, platelets 192  Glucose 91, BUN 31, creatinine 0.2, sodium 131, potassium 4.5      Review of Systems  DATA OBTAINED: from patient, nurse, medical record GENERAL: Feels "OK"   No fevers, fatigue, appetite  improving SKIN: No itch, rash or open wounds EYES: No eye pain, dryness or itching  No change in vision EARS: No earache, tinnitus, change in hearing NOSE: No congestion, drainage or bleeding MOUTH/THROAT: No mouth or tooth pain  No sore throat    No difficulty chewing or swallowing RESPIRATORY: No cough, wheezing, SOB CARDIAC: No chest pain, palpitations  No edema. GI: No abdominal pain  No N/V/D. Constipation improved  No heartburn or reflux   GU: No dysuria, increased frequency or urgency  No change in urine volume or character MUSCULOSKELETAL: Posterior neck and scalp pain  No joint pain, swelling or stiffness o muscle ache, pain, weakness  Gait is unsteady ls.  NEUROLOGIC: No dizziness, fainting, numbness, tinging.  Mild headache. No change in mental status.   PSYCHIATRIC: Signs of anxiety,   No behavior issue.   Physical Exam Filed Vitals:   06/14/13 1630  BP: 111/62  Pulse: 68   GENERAL APPEARANCE: No acute distress, appropriately groomed, Thin body habitus. Alert, pleasant, conversant. SKIN: No diaphoresis, rash, unusual lesions,  wounds HEAD: Normocephalic, atraumatic EYES: Conjunctiva/lids clear. Pupils round, reactive. Marland Kitchen   EARS:  Hearing grossly normal. NOSE: No deformity or discharge. MOUTH/THROAT: Lips w/o lesions. Oral mucosa, tongue moist, w/o lesion. Oropharynx w/o redness or lesions.   NECK: Immobilized in a cervical collar LYMPHATICS: No head, neck or supraclavicular adenopathy RESPIRATORY: Breathing is even, unlabored. Lung sounds are clear and full.   CARDIOVASCULAR: Heart RRR. No murmur or extra heart sounds             EDEMA: No peripheral or periorbital edema. No ascites GASTROINTESTINAL: Abdomen is soft, non-tender, not distended w/ decreased bowel sounds. No hepatic or splenic enlargement.  MUSCULOSKELETAL: Moves all extremities with full ROM, strength and tone. Back with kyphosis. No scoliosis or spinal process tenderness. Gait is not assessed today NEUROLOGIC: Oriented to time, place, person. Cranial nerves 2-12 grossly intact, speech clear, no tremor.     ASSESSMENT/PLAN  C2 cervical fracture Satisfactory report from Dr.Pool. Pt.with less pain in general, will decrease scheduled hydrocodone to BID, change methocarbamol to prn. Continue PT/OT interventions to maximize functional status.   Patient goal: return to [place] home with [place]  Follow up: As  needed  Alexzandria Massman T.Norvin Ohlin, NP-C 06/14/2013

## 2013-06-21 ENCOUNTER — Non-Acute Institutional Stay (SKILLED_NURSING_FACILITY): Payer: Medicare Other | Admitting: Geriatric Medicine

## 2013-06-21 ENCOUNTER — Encounter: Payer: Self-pay | Admitting: Geriatric Medicine

## 2013-06-21 DIAGNOSIS — I10 Essential (primary) hypertension: Secondary | ICD-10-CM

## 2013-06-21 DIAGNOSIS — K5900 Constipation, unspecified: Secondary | ICD-10-CM

## 2013-06-21 DIAGNOSIS — IMO0002 Reserved for concepts with insufficient information to code with codable children: Secondary | ICD-10-CM

## 2013-06-21 DIAGNOSIS — S12100D Unspecified displaced fracture of second cervical vertebra, subsequent encounter for fracture with routine healing: Secondary | ICD-10-CM

## 2013-06-21 NOTE — Progress Notes (Signed)
Patient ID: Jacob Rosales, male   DOB: 1928/01/31, 79 y.o.   MRN: [id number] [facility] [id number])  Code Status: DNR Contact Information   Name Relation Home Work Mobile   Ortez,[name] Spouse [phone]     [name],[name] Daughter [phone]        Chief Complaint  Patient presents with  . Discharge Note    Rehab Discharge  . C2 fracture    HPI: This 79 y.o. male resident of [facility] Independent Living [facility] was admitted to Rehab section 05/06/2013.  He had a fall 05/03/13 with resultant coronal fracture of the body of C2 without significant displacement. Patient was evaluated by Dr. Julio Sicks; recommended neck immobilization in collar, mobilize with physical therapy. Repeat cervical spine film on September 2 revealed stable fracture, patient was discharged home, instructed to take Tylenol for pain.  Patient was admitted to Rehab section at [facility]  due to  difficulty with ADLs/mobilizing.  Patient continued to have significant pain, relieved somewhat with improvement in posture along with scheduled hydrocodone/Robaxin. Patient followed up as scheduled with Dr.Pool August 18. Repeat CT scan of the neck revealed an unstable C2 fracture. Dr. Lindalou Hose assessment: coronal plane C2 fracture with severe subluxation and angulation causing marked spinal stenosis and deformity. Discussed options of available for treatment,  recommended posterior cervical fusion with lateral mass instrumentation from C1-C3. The patient was admitted to hospital on September 19 and underwent surgery the same day. Review of record shows surgical procedure and immediate postoperative period were uneventful.  Patient was discharged back to the Rehabilitation section at [facility] on September 21 with instructions for no strenuous activity, increase activity slowly,  no heavy lifting, must wear neck collar at all times.   At 06/10/13 followup with Dr. Jordan Likes, X-ray demonstrated satisfactory  healing, patient was told he would be wearing the neck collar for 8-10 weeks.  During his rehabilitation stay patient has made slow but steady progress in regaining ambulatory status and improving functional status with ADLs. Poor balance along with a mild left foot drop have impacted significantly his ability to ambulate. He has made progress with PT interventions including heel lifts and use of a walker. He was cleared to ambulate independently late last week and has been demonstrating safe ambulation with his walker for the last several days. Patient has demonstrated independence with basic ADLs, he continues to require assistance with bathing and showering. OT continues to work with the patient in these areas, he will require he will continue to require assistance in these areas once discharged home. OT plans to continue to assist with showers at home, patient's spouse is being educated regarding shaving and appropriate neck precautions during this procedure.  The patient's pain medications were reduced last week, he is not experiencing increased pain. He reports most uncomfortable time is when he is walking he experiences pain in the back of his neck and back of his scalp.  Review of record shows patient's blood pressure and pulse have been stable throughout his rehabilitation stay, he has had a 12 pound weight loss since readmission. Patient has been eating a soft diet as he has some difficulty chewing with a neck collar on, he has been excepting a nutritional supplement daily every day (Boost with vanilla ice cream). Constipation has resolved, patient continues to void without difficulty. Staff has noted some intermittent confusion/disorientation especially at night. This has improved with less pain medication. OT/nursing staff reports the patient also exhibits some short-term memory loss; he  seems to have difficulty remembering specific instructions. OT also reports patient exhibits some  impulsivity. Discussed current plan for discharge at length with nurse and OT today. Plan includes minor home modifications for safety, Personal Care Service assistance in the overnight hours to assist patient to the toilet. He continues to be up multiple times during the night to void. OT and PT will continue their interventions in the home setting.     Allergies  Allergen Reactions  . Warfarin Sodium Other (See Comments)    REACTION: Stomach bleeding  . Amlodipine Besylate Swelling      Medication List       This list is accurate as of: 06/21/13  3:40 PM.  Always use your most recent med list.               CALCIUM 600 + D PO  Take 1 tablet by mouth 2 (two) times daily.     carvedilol 25 MG tablet  Commonly known as:  COREG  Take 25 mg by mouth 2 (two) times daily with a meal.     cholecalciferol 1000 UNITS tablet  Commonly known as:  VITAMIN D  Take 1,000 Units by mouth daily.     dipyridamole-aspirin 200-25 MG per 12 hr capsule  Commonly known as:  AGGRENOX  Take one tablet twice daily to reduce stroke     HYDROcodone-acetaminophen 7.5-325 MG per tablet  Commonly known as:  NORCO  Take 1 tablet by mouth as directed. Take 1 tablet at 8am, 8pm. May have additional tablet every 4 hours as needed to reduce pain.     ipratropium 0.06 % nasal spray  Commonly known as:  ATROVENT  Place 2 sprays into the nose 4 (four) times daily.     lisinopril 20 MG tablet  Commonly known as:  PRINIVIL,ZESTRIL  Take 20 mg by mouth every evening.     LORazepam 1 MG tablet  Commonly known as:  ATIVAN  Take 1 mg by mouth at bedtime as needed (for sleep).     methocarbamol 500 MG tablet  Commonly known as:  ROBAXIN  Take 500 mg by mouth as directed. May take 1 tablet twice daily as needed for muscle spasm/pain     polyethylene glycol packet  Commonly known as:  MIRALAX / GLYCOLAX  Take 17 g by mouth daily.     senna-docusate 8.6-50 MG per tablet  Commonly known as:  Senokot-S   Take 2 tablets by mouth daily.     simvastatin 20 MG tablet  Commonly known as:  ZOCOR  Take 20 mg by mouth at bedtime.     SYSTANE 0.4-0.3 % Soln  Generic drug:  Polyethyl Glycol-Propyl Glycol  Place 1 drop into both eyes every 4 (four) hours as needed (for dry eyes).     triamterene-hydrochlorothiazide 37.5-25 MG per capsule  Commonly known as:  DYAZIDE  Take 1 capsule by mouth every morning.        DATA REVIEWED  Radiologic Exams 05/04/2013:   CT HEAD IMPRESSION: No acute intracranial abnormality.   CT CERVICAL SPINE IMPRESSION:     1. Comminuted C2 vertebral body and bilateral pedicle fracture. Mild displacement of fracture fragments and small volume epidural hematoma at the C2 level.   2. Osteopenia. No other acute cervical spine or skull base fracture identified.                             [facility]  X-ray  05/05/2013 Cervical Spine: Limited views- no acute fracture noted   [facility] 06/10/2013 cervical spine x-ray: Stable appearance of posterior cervical fusion with instrumentation. Overall alignment is maintained  Cardiovascular Exams:   Laboratory Studies [facility] 09/29/2012 glucose 82, BUN 23, creatinine 1.39, sodium 138, potassium 4.4. Protein/LFTs WNL             Total cholesterol 143, triglycerides 72, HDL 52, LDL 77 03/30/2013 PSA .85                          [facility] 05/03/2013 hemoglobin 14.3, hematocrit 42.0             Glucose 116, BUN 20, creatinine 1.4, sodium 132, potassium 4.7  05/21/2013 WBC 7.9, hemoglobin 14.8, hematocrit 42.4, platelets 192  Glucose 91, BUN 31, creatinine 0.2, sodium 131, potassium 4.5      Review of Systems  DATA OBTAINED: from patient, nurse, medical record GENERAL: Feels very well   No fevers, fatigue, appetite fair SKIN: No itch, rash or open wounds EYES: No eye pain, dryness or itching  No change in vision EARS: No earache, tinnitus, change in hearing NOSE: No  congestion, drainage or bleeding MOUTH/THROAT: No mouth or tooth pain  No sore throat    No difficulty chewing or swallowing RESPIRATORY: No cough, wheezing, SOB CARDIAC: No chest pain, palpitations  No edema. GI: No abdominal pain  No N/V/D. Constipation resolved  No heartburn or reflux   GU: No dysuria, increased frequency or urgency  No change in urine volume or character  Nocturia 3-5 times/night  MUSCULOSKELETAL: Less prominent Posterior neck and scalp pain  No joint pain, swelling or stiffness No muscle ache, pain, weakness  Gait is unsteady, must use walker AAT  NEUROLOGIC: No dizziness, fainting, numbness, tinging.  No headache. No change in mental status.   PSYCHIATRIC: Fewer signs of anxiety,   No behavior issue.   Physical Exam Filed Vitals:   06/21/13 1240  BP: 109/62  Pulse: 89  Weight: 135 lb 12.8 oz (61.598 kg)  SpO2: 96%   GENERAL APPEARANCE: No acute distress, appropriately groomed, Thin body habitus. Alert, pleasant, conversant. SKIN: No diaphoresis, rash, unusual lesions, wounds HEAD: Normocephalic, atraumatic EYES: Conjunctiva/lids clear. Pupils round, reactive. Marland Kitchen   EARS:  Hearing grossly normal. NOSE: No deformity or discharge. MOUTH/THROAT: Lips w/o lesions. Oral mucosa, tongue moist, w/o lesion.   NECK: Immobilized in a cervical collar LYMPHATICS: No head, neck or supraclavicular adenopathy RESPIRATORY: Breathing is even, unlabored. Lung sounds are clear and full.   CARDIOVASCULAR: Heart RRR. No murmur or extra heart sounds             EDEMA: No peripheral or periorbital edema. No ascites GASTROINTESTINAL: Abdomen is soft, non-tender, not distended w/ decreased bowel sounds. No hepatic or splenic enlargement.  MUSCULOSKELETAL: Moves all extremities with full ROM, strength and tone. Back with kyphosis. No scoliosis or spinal process tenderness. Gait is not assessed today NEUROLOGIC: Oriented to time, place, person. Cranial nerves 2-12 grossly intact, speech  clear, no tremor.     ASSESSMENT/PLAN  Hypertension Blood pressure and pulse remained stable with current medications. Update lab prior to next clinic visit  Unspecified constipation Improved with daily dosing of MiraLax. Patient also drinks prune juice  C2 cervical fracture Patient is status post fall 05/03/2013 with C2 fracture. This was followed by posterior cervical fusion September 19. Patient is making progress in recovering from this acute  injury; he has regained ambulatory status with a walker, is independent with most ADLs. He is requiring less pain medication. Healing is proceeding as expected per Dr. Lindalou Hose most recent visit. He will follow with him again 06/30/13. Anticipate this patient will be discharged to his independent living home with his spouse later this week home modifications are completed and PCS scheduling is in place.   Time: 40 minutes  Follow up: 2 weeks in clinic w/ Dr.Green.  Lab prior to visit; lipid panel, CMP  Patria Warzecha T.Mekia Dipinto, NP-C 06/21/2013

## 2013-06-21 NOTE — Assessment & Plan Note (Signed)
Patient is status post fall 05/03/2013 with C2 fracture. This was followed by posterior cervical fusion September 19. Patient is making progress in recovering from this acute injury; he has regained ambulatory status with a walker, is independent with most ADLs. He is requiring less pain medication. Healing is proceeding as expected per Dr. Lindalou Hose most recent visit. He will follow with him again 06/30/13. Anticipate this patient will be discharged to his independent living home with his spouse later this week home modifications are completed and PCS scheduling is in place.

## 2013-06-21 NOTE — Assessment & Plan Note (Signed)
Improved with daily dosing of MiraLax. Patient also drinks prune juice

## 2013-06-21 NOTE — Assessment & Plan Note (Addendum)
Satisfactory report from Dr.Pool. Pt.with less pain in general, will decrease scheduled hydrocodone to BID, change methocarbamol to prn. Continue PT/OT interventions to maximize functional status.

## 2013-06-21 NOTE — Assessment & Plan Note (Signed)
Blood pressure and pulse remained stable with current medications. Update lab prior to next clinic visit

## 2013-06-28 ENCOUNTER — Ambulatory Visit (INDEPENDENT_AMBULATORY_CARE_PROVIDER_SITE_OTHER): Payer: Medicare Other | Admitting: *Deleted

## 2013-06-28 DIAGNOSIS — I442 Atrioventricular block, complete: Secondary | ICD-10-CM

## 2013-06-28 DIAGNOSIS — I5022 Chronic systolic (congestive) heart failure: Secondary | ICD-10-CM

## 2013-06-28 LAB — REMOTE ICD DEVICE
AL IMPEDENCE ICD: 350 Ohm
ATRIAL PACING ICD: 92 pct
BAMS-0003: 70 {beats}/min
BRDY-0002RA: 60 {beats}/min
BRDY-0003RA: 120 {beats}/min
BRDY-0004RA: 120 {beats}/min
LV LEAD IMPEDENCE ICD: 650 Ohm
RV LEAD IMPEDENCE ICD: 350 Ohm
TZAT-0001SLOWVT: 1
TZAT-0012SLOWVT: 200 ms
TZAT-0013SLOWVT: 3
TZAT-0019SLOWVT: 7.5 V
TZAT-0020SLOWVT: 1 ms
TZON-0004SLOWVT: 30
TZON-0005SLOWVT: 6
TZST-0001SLOWVT: 2
TZST-0001SLOWVT: 3
TZST-0001SLOWVT: 4
TZST-0003SLOWVT: 20 J
TZST-0003SLOWVT: 36 J
TZST-0003SLOWVT: 40 J

## 2013-06-29 LAB — LIPID PANEL
Cholesterol: 159 mg/dL (ref 0–200)
HDL: 51 mg/dL (ref 35–70)
LDL Cholesterol: 89 mg/dL
Triglycerides: 76 mg/dL (ref 40–160)

## 2013-06-29 LAB — BASIC METABOLIC PANEL
Creatinine: 1.4 mg/dL — AB (ref 0.6–1.3)
Potassium: 4.4 mmol/L (ref 3.4–5.3)
Sodium: 137 mmol/L (ref 137–147)

## 2013-07-01 ENCOUNTER — Encounter: Payer: Self-pay | Admitting: *Deleted

## 2013-07-05 ENCOUNTER — Non-Acute Institutional Stay: Payer: Medicare Other | Admitting: Internal Medicine

## 2013-07-05 ENCOUNTER — Encounter: Payer: Self-pay | Admitting: Internal Medicine

## 2013-07-05 VITALS — BP 108/68 | HR 100 | Ht 70.0 in | Wt 137.0 lb

## 2013-07-05 DIAGNOSIS — I10 Essential (primary) hypertension: Secondary | ICD-10-CM

## 2013-07-05 DIAGNOSIS — M545 Low back pain: Secondary | ICD-10-CM

## 2013-07-05 DIAGNOSIS — R634 Abnormal weight loss: Secondary | ICD-10-CM

## 2013-07-05 DIAGNOSIS — I4891 Unspecified atrial fibrillation: Secondary | ICD-10-CM

## 2013-07-05 DIAGNOSIS — S12100D Unspecified displaced fracture of second cervical vertebra, subsequent encounter for fracture with routine healing: Secondary | ICD-10-CM

## 2013-07-05 DIAGNOSIS — K5900 Constipation, unspecified: Secondary | ICD-10-CM

## 2013-07-05 DIAGNOSIS — E785 Hyperlipidemia, unspecified: Secondary | ICD-10-CM

## 2013-07-05 DIAGNOSIS — M216X9 Other acquired deformities of unspecified foot: Secondary | ICD-10-CM

## 2013-07-05 DIAGNOSIS — M21371 Foot drop, right foot: Secondary | ICD-10-CM | POA: Insufficient documentation

## 2013-07-05 DIAGNOSIS — IMO0002 Reserved for concepts with insufficient information to code with codable children: Secondary | ICD-10-CM

## 2013-07-05 MED ORDER — HYDROCODONE-ACETAMINOPHEN 7.5-325 MG PO TABS: 1.0000 | ORAL_TABLET | ORAL | Status: DC

## 2013-07-05 NOTE — Patient Instructions (Addendum)
Continue medications as on your new list.

## 2013-07-05 NOTE — Progress Notes (Signed)
Subjective:    Patient ID: Jacob Rosales, male    DOB: 18-Feb-1928, 79 y.o.   MRN: [id number]  Chief Complaint  Patient presents with  . Medical Managment of Chronic Issues    patient fell 05/03/13 C2 fracture. Was d/c from Rehab on 06/21/13 for follow up.  With wife today    HPI  This 79 y.o. male resident of [facility] was admitted to Rehab section 05/06/2013.  He had a fall 05/03/13 with resultant neck pain. Evaluation in the emergency department included CT scan of the cervical spine which demonstrated a coronal fracture of the body of C2 without significant displacement. Patient was evaluated by Dr. Julio Sicks; recommended neck immobilization in collar, mobilize with physical therapy. Repeat cervical spine film on September 2 revealed stable fracture, patient was discharged home, instructed to take Tylenol for pain.  Patient was evaluated in Clinic 9/3 due to severe pain in his neck and his head, having difficult time with mobility in his home, requiring spouse to assist him.  After some discussion patient reported that he felt like something "popped" when he tried to get out of bed.  An X-ray was obtained, no new fracture identified. Patient was RX hydrocodone/acetaminophen for pain management. He continued to have difficulty with ADLs/mobilizing, was admitted to Rehab section when a room became available.  Patient continued to have significant pain, relieved somewhat with improvment in posture along with scheduled hydrocodone/Robaxin. Patient followed up as scheduled with Dr.Pool August 18. Repeat CT scan of the neck revealed an unstable C2 fracture. Dr. Lindalou Hose assessment: coronal plane C2 fracture with severe subluxation and angulation causing marked spinal stenosis and deformity. Discussed options of available for treatment,  recommended posterior cervical fusion with lateral mass instrumentation from C1-C3. The patient was admitted to hospital on  September 19 and underwent surgery the same day. Review of record shows surgical procedure and immediate postoperative period were uneventful. His neck remained immobilized with a cervical collar, pain managed adequately with p.o. medication. Patient was discharged back to the Rehabilitation section at [facility] on September 21 with instructions for no strenuous activity, increase activity slowly,  no heavy lifting, must wear collar at all times.   Returned home 06/23/13.  Has lost 13# since 05/03/13. Difficult to eat with the cervical collar.Poor appetite. Chewing is difficult.  Has noted a 3mm lesion on the left supraclavicular area.  Having some low back pain. Has not changed much.  Bp is much power than in the past. Likely due to weight loss.  Therapy has started him on a right AFO  Due to foot drop of the right foot.  Current Outpatient Prescriptions on File Prior to Visit  Medication Sig Dispense Refill  . Calcium Carbonate-Vitamin D (CALCIUM 600 + D PO) Take 1 tablet by mouth 2 (two) times daily.      . carvedilol (COREG) 25 MG tablet Take 25 mg by mouth 2 (two) times daily with a meal.      . cholecalciferol (VITAMIN D) 1000 UNITS tablet Take 1,000 Units by mouth daily.        Marland Kitchen dipyridamole-aspirin (AGGRENOX) 200-25 MG per 12 hr capsule Take one tablet twice daily to reduce stroke  180 capsule  3  . HYDROcodone-acetaminophen (NORCO) 7.5-325 MG per tablet Take 1 tablet by mouth as directed. Take 1 tablet at 8am, 8pm. May have additional tablet every 4 hours as needed to reduce pain.      Marland Kitchen ipratropium (ATROVENT) 0.06 %  nasal spray Place 2 sprays into the nose 4 (four) times daily.      Marland Kitchen lisinopril (PRINIVIL,ZESTRIL) 20 MG tablet Take 20 mg by mouth every evening.       Marland Kitchen LORazepam (ATIVAN) 1 MG tablet Take 1 mg by mouth at bedtime as needed (for sleep).      . methocarbamol (ROBAXIN) 500 MG tablet Take 500 mg by mouth as directed. May take 1 tablet twice daily as needed for muscle  spasm/pain      . Polyethyl Glycol-Propyl Glycol (SYSTANE) 0.4-0.3 % SOLN Place 1 drop into both eyes every 4 (four) hours as needed (for dry eyes).      . polyethylene glycol (MIRALAX / GLYCOLAX) packet Take 17 g by mouth daily.      Marland Kitchen senna-docusate (SENOKOT-S) 8.6-50 MG per tablet Take 2 tablets by mouth daily.      . simvastatin (ZOCOR) 20 MG tablet Take 20 mg by mouth at bedtime.        . triamterene-hydrochlorothiazide (DYAZIDE) 37.5-25 MG per capsule Take 1 capsule by mouth every morning.         No current facility-administered medications on file prior to visit.    Review of Systems  Constitutional: Negative for fever, activity change, appetite change, fatigue and unexpected weight change.  HENT: Positive for rhinorrhea. Negative for ear pain, facial swelling and sinus pressure.   Eyes: Negative for photophobia, pain, discharge, redness and itching.  Cardiovascular: Negative for chest pain, palpitations and leg swelling.       PAF.AICD.  Gastrointestinal: Negative for nausea, abdominal pain, abdominal distention and rectal pain.       HX. Fecal incontinence. Brown staining when he wipes rectum. Hasa postprandial fecal urgency. Loss of appetite since fx of C2.  Endocrine: Negative.   Genitourinary:       Hx urinary incontinence. Leakage at night.  Musculoskeletal: Positive for back pain and myalgias. Negative for neck pain.       S/P fx C2 and surgery for stabilization. Low back discomfort.  Neurological:       Right foot drop  Hematological: Negative.   Psychiatric/Behavioral: Negative.        Objective:BP 108/68  Pulse 100  Ht 5\' 10"  (1.778 m)  Wt 137 lb (62.143 kg)  BMI 19.66 kg/m2    Physical Exam  Constitutional:  Frail elderly male.  HENT:  Right Ear: External ear normal.  Left Ear: External ear normal.  Nose: Nose normal.  Eyes: Conjunctivae and EOM are normal. Left eye exhibits no discharge.  Neck: Normal range of motion. Neck supple. No JVD present. No  tracheal deviation present. No thyromegaly present.  Wearing cervical collar.  Cardiovascular: Normal rate and regular rhythm.  Exam reveals no gallop and no friction rub.   No murmur heard. Pulmonary/Chest: Effort normal. No respiratory distress. He has no wheezes. He has no rales.  Abdominal: Soft. Bowel sounds are normal. He exhibits no distension and no mass. There is no tenderness.  Musculoskeletal: Normal range of motion. He exhibits no edema and no tenderness.  Right AFO  Lymphadenopathy:    He has no cervical adenopathy.  Neurological: He is alert. He has normal reflexes. No cranial nerve deficit.  Right foot drop.  Skin: Skin is warm and dry. Rash noted.  Psychiatric: He has a normal mood and affect. His behavior is normal. Judgment and thought content normal.     LAB REVIEW 06/29/13 BMP: glu 80, BUN 28, creat 1.38  Lipids: TC 159,  trig 76, HDL 51, LDL 89     Assessment & Plan:  Atrial fibrillation; currently in NSR  Unspecified constipation: improved  C2 cervical fracture, with routine healing, subsequent encounter: pains are bettr. Reccommended he start weaning from hydrocodone  Dyslipidemia: controlled  Lumbago: mild  Loss of weight: increase Boost,, Ensure , or milkshakes  Right foot drop: wear AFO  Hypertension: running low. Stop lisinopril and triampterene/HCT

## 2013-07-08 NOTE — Progress Notes (Signed)
Remote defib received  

## 2013-07-13 ENCOUNTER — Encounter: Payer: Self-pay | Admitting: Internal Medicine

## 2013-07-14 ENCOUNTER — Non-Acute Institutional Stay: Payer: Medicare Other | Admitting: Geriatric Medicine

## 2013-07-14 VITALS — BP 138/82 | HR 96 | Temp 97.0°F | Ht 70.0 in | Wt 141.0 lb

## 2013-07-14 DIAGNOSIS — S12100D Unspecified displaced fracture of second cervical vertebra, subsequent encounter for fracture with routine healing: Secondary | ICD-10-CM

## 2013-07-14 DIAGNOSIS — R21 Rash and other nonspecific skin eruption: Secondary | ICD-10-CM

## 2013-07-14 DIAGNOSIS — IMO0002 Reserved for concepts with insufficient information to code with codable children: Secondary | ICD-10-CM

## 2013-07-14 NOTE — Progress Notes (Signed)
Patient ID: Jacob Rosales, male   DOB: Mar 02, 1928, 79 y.o.   MRN: [id number] [facility] [phone])  Code Status: DNR      Contact Information   Name Relation Home Work Mobile   Kempen,[name] Spouse [phone]     [name],[name] Daughter [phone]        Chief Complaint  Patient presents with  . Rash    both arms for one week.     HPI: This is a 79 y.o. male resident of [facility],  Independent Living  section.  Evaluation is requested today due to rash on both arms.  Has been applying Cetaphil lotion to the rash. Patient recalls his arms started itching then the rash developed.  Patient requests refill for hydrocodone,  Tells me he ran out of pills, has not taken any in the last few days. He's been taking Tylenol with good relief of generalized discomfort.   Patient continues to wear neck collar as instructed. He is due to return to Dr. Jordan Likes in 2 weeks. Reports he's been getting around better at home. He and spouse have decided to stop the overnight caregivers as of Sunday 11/16. He's not getting up during the night as much as previously, spells feels like she can manage being up several times a night to supervise him to the bathroom.   Allergies  Allergen Reactions  . Warfarin Sodium Other (See Comments)    REACTION: Stomach bleeding  . Amlodipine Besylate Swelling      Medication List       This list is accurate as of: 07/14/13 11:59 PM.  Always use your most recent med list.               CALCIUM 600 + D PO  Take 1 tablet by mouth 2 (two) times daily.     carvedilol 25 MG tablet  Commonly known as:  COREG  Take 25 mg by mouth 2 (two) times daily with a meal.     cholecalciferol 1000 UNITS tablet  Commonly known as:  VITAMIN D  Take 1,000 Units by mouth daily.     dipyridamole-aspirin 200-25 MG per 12 hr capsule  Commonly known as:  AGGRENOX  Take one tablet twice daily to reduce stroke     ipratropium 0.06 %  nasal spray  Commonly known as:  ATROVENT  Place 2 sprays into the nose 4 (four) times daily.     LORazepam 1 MG tablet  Commonly known as:  ATIVAN  Take 1 mg by mouth at bedtime as needed (for sleep).     methocarbamol 500 MG tablet  Commonly known as:  ROBAXIN  Take 500 mg by mouth as directed. May take 1 tablet twice daily as needed for muscle spasm/pain     polyethylene glycol packet  Commonly known as:  MIRALAX / GLYCOLAX  Take 17 g by mouth daily.     senna-docusate 8.6-50 MG per tablet  Commonly known as:  Senokot-S  Take 2 tablets by mouth daily.     simvastatin 20 MG tablet  Commonly known as:  ZOCOR  Take 20 mg by mouth at bedtime.     SYSTANE 0.4-0.3 % Soln  Generic drug:  Polyethyl Glycol-Propyl Glycol  Place 1 drop into both eyes every 4 (four) hours as needed (for dry eyes).        DATA REVIEWED  Radiologic Exams 05/04/2013:   CT HEAD IMPRESSION: No acute intracranial abnormality.   CT CERVICAL  SPINE IMPRESSION:     1. Comminuted C2 vertebral body and bilateral pedicle fracture. Mild displacement of fracture fragments and small volume epidural hematoma at the C2 level.   2. Osteopenia. No other acute cervical spine or skull base fracture identified.                             [facility]  05/05/2013 Cervical Spine: Limited views- no acute fracture noted   [facility] 06/10/2013 cervical spine x-ray: Stable appearance of posterior cervical fusion with instrumentation. Overall alignment is maintained  Cardiovascular Exams:   Laboratory Studies  [facility] 09/29/2012 glucose 82, BUN 23, creatinine 1.39, sodium 138, potassium 4.4. Protein/LFTs WNL             Total cholesterol 143, triglycerides 72, HDL 52, LDL 77 03/30/2013 PSA .85                          [facility] 05/03/2013 hemoglobin 14.3, hematocrit 42.0             Glucose 116, BUN 20, creatinine 1.4, sodium 132, potassium 4.7  05/21/2013 WBC  7.9, hemoglobin 14.8, hematocrit 42.4, platelets 192  Glucose 91, BUN 31, creatinine 0.2, sodium 131, potassium 4.5  Lab Results-[facility]  Component Value Date   CHOL 159 06/29/2013   TRIG 76 06/29/2013   HDL 51 06/29/2013   LDLCALC 89 06/29/2013        NA 137 06/29/2013   K 4.4 06/29/2013   CREATININE 1.4* 06/29/2013   BUN 28* 06/29/2013        Review of Systems  DATA OBTAINED: from patient,spouse GENERAL: Feels very well   No fevers, fatigue, appetite fair SKIN: No itch, rash or open wounds EYES: No eye pain, dryness or itching  No change in vision EARS: No earache, tinnitus, change in hearing NOSE: No congestion, drainage or bleeding MOUTH/THROAT: No mouth or tooth pain  No sore throat    No difficulty chewing or swallowing RESPIRATORY: No cough, wheezing, SOB CARDIAC: No chest pain, palpitations  No edema. GI: No abdominal pain  No N/V/D. Constipation resolved  No heartburn or reflux   GU: No dysuria, increased frequency or urgency  No change in urine volume or character  Nocturia 2-4 times/night  MUSCULOSKELETAL: Minimal Posterior neck and scalp pain  No joint pain, swelling or stiffness No muscle ache, pain, weakness  Gait is unsteady, must use walker AAT  NEUROLOGIC: No dizziness, fainting, numbness, tinging.  No change in mental status.   PSYCHIATRIC: Denies anxiety, depression.    Physical Exam Filed Vitals:   07/14/13 1309  BP: 138/82  Pulse: 96  Temp: 97 F (36.1 C)  TempSrc: Oral  Height: 5\' 10"  (1.778 m)  Weight: 141 lb (63.957 kg)   GENERAL APPEARANCE: No acute distress, appropriately groomed, Thin body habitus. Alert, pleasant, conversant. Has gained about 5 pounds since rehabilitation discharge  SKIN: No diaphoresis. Left arm rash consists of multiple small flat dry patches. Left clavicle with  circumscribed red, dry patch  HEAD: Normocephalic, atraumatic EYES: Conjunctiva/lids clear. Pupils round, reactive. Marland Kitchen   EARS:  Hearing grossly normal. NOSE: No  deformity or discharge. MOUTH/THROAT: Lips w/o lesions. Oral mucosa, tongue moist, w/o lesion.   NECK: Immobilized in a cervical collar LYMPHATICS: No head, neck or supraclavicular adenopathy RESPIRATORY: Breathing is even, unlabored. Lung sounds are clear and full.   CARDIOVASCULAR: Heart  RRR. No murmur or extra heart sounds             EDEMA: No peripheral or periorbital edema. No ascites MUSCULOSKELETAL: Moves all extremities with full ROM, strength and tone. Back with kyphosis. No scoliosis or spinal process tenderness. Gait steady with walker  NEUROLOGIC: Oriented to time, place, person. Speech clear, no tremor.   PSYCHIATRIC:  Mood and behavior appropriate to situation   ASSESSMENT/PLAN  Rash and nonspecific skin eruption Left arm with rash of multiple flat small dry patches, left clavicle with dry patch as well. Dry skin vs. psoriasis. Will start with emollient cream. If not effective, recommend hydrocortisone.  C2 cervical fracture Status post fall 05/03/2013 with C2 fracture, followed by posterior cervical fusion September 19. Neck remains immobilized in collar, patient due for followup with Dr. Jordan Likes November 25.  Patient to longer requires hydrocodone for pain management.    Time: 45 minutes,>50% spent counseling/or care coordination  Follow up: As scheduled in clinic w/ Dr.Green.   Onnika Siebel T.Adrine Hayworth, NP-C 07/14/2013

## 2013-07-18 ENCOUNTER — Encounter: Payer: Self-pay | Admitting: Geriatric Medicine

## 2013-07-18 DIAGNOSIS — R21 Rash and other nonspecific skin eruption: Secondary | ICD-10-CM | POA: Insufficient documentation

## 2013-07-18 NOTE — Assessment & Plan Note (Addendum)
Status post fall 05/03/2013 with C2 fracture, followed by posterior cervical fusion September 19. Neck remains immobilized in collar, patient due for followup with Dr. Jordan Likes November 25.  Patient to longer requires hydrocodone for pain management.

## 2013-07-18 NOTE — Assessment & Plan Note (Signed)
Left arm with rash of multiple flat small dry patches, left clavicle with dry patch as well. Dry skin vs. psoriasis. Will start with emollient cream. If not effective, recommend hydrocortisone.

## 2013-07-23 ENCOUNTER — Encounter: Payer: Self-pay | Admitting: Geriatric Medicine

## 2013-08-09 ENCOUNTER — Encounter: Payer: Self-pay | Admitting: Internal Medicine

## 2013-08-09 ENCOUNTER — Non-Acute Institutional Stay: Payer: Medicare Other | Admitting: Internal Medicine

## 2013-08-09 VITALS — BP 152/90 | HR 88 | Ht 70.0 in | Wt 144.0 lb

## 2013-08-09 DIAGNOSIS — I10 Essential (primary) hypertension: Secondary | ICD-10-CM

## 2013-08-09 DIAGNOSIS — K5900 Constipation, unspecified: Secondary | ICD-10-CM

## 2013-08-09 DIAGNOSIS — R32 Unspecified urinary incontinence: Secondary | ICD-10-CM

## 2013-08-09 DIAGNOSIS — IMO0002 Reserved for concepts with insufficient information to code with codable children: Secondary | ICD-10-CM

## 2013-08-09 DIAGNOSIS — R21 Rash and other nonspecific skin eruption: Secondary | ICD-10-CM

## 2013-08-09 DIAGNOSIS — M545 Low back pain: Secondary | ICD-10-CM

## 2013-08-09 DIAGNOSIS — S12100D Unspecified displaced fracture of second cervical vertebra, subsequent encounter for fracture with routine healing: Secondary | ICD-10-CM

## 2013-08-09 MED ORDER — LISINOPRIL 20 MG PO TABS: ORAL_TABLET | ORAL | Status: DC

## 2013-08-09 MED ORDER — TRIAMCINOLONE ACETONIDE 0.025 % EX CREA: 1.0000 "application " | TOPICAL_CREAM | Freq: Two times a day (BID) | CUTANEOUS | Status: DC

## 2013-08-09 NOTE — Progress Notes (Signed)
Subjective:    Patient ID: Jacob Rosales, male    DOB: 27-Sep-1927, 79 y.o.   MRN: [id number]  HPI Atrial fibrillation; currently in NSR   Unspecified constipation: improved   C2 cervical fracture, with routine healing, subsequent encounter: pains are bettr. Last visit, I recommended he start weaning from hydrocodone. Dr Jordan Likes got rid of the hard collar at his last visit  Dyslipidemia: controlled   Lumbago: mild   Loss of weight: increase Boost,, Ensure , or milkshakes   Right foot drop: wear AFO   Ras of the forearms in Nov 2014. Using Aveeno Cream. Continues to have a speckled appearance Hypertension: running low last visit. Stopped lisinopril and triampterene/HCT   Current Outpatient Prescriptions on File Prior to Visit  Medication Sig Dispense Refill  . Calcium Carbonate-Vitamin D (CALCIUM 600 + D PO) Take 1 tablet by mouth 2 (two) times daily.      . carvedilol (COREG) 25 MG tablet Take 25 mg by mouth 2 (two) times daily with a meal.      . cholecalciferol (VITAMIN D) 1000 UNITS tablet Take 1,000 Units by mouth daily.        Marland Kitchen dipyridamole-aspirin (AGGRENOX) 200-25 MG per 12 hr capsule Take one tablet twice daily to reduce stroke  180 capsule  3  . LORazepam (ATIVAN) 1 MG tablet Take 1 mg by mouth at bedtime as needed (for sleep).      . polyethylene glycol (MIRALAX / GLYCOLAX) packet Take 17 g by mouth daily. As needed      . senna-docusate (SENOKOT-S) 8.6-50 MG per tablet Take 2 tablets by mouth daily.      . simvastatin (ZOCOR) 20 MG tablet Take 20 mg by mouth at bedtime.         No current facility-administered medications on file prior to visit.    Review of Systems  Constitutional: Negative for fever, activity change, appetite change, fatigue and unexpected weight change.  HENT: Positive for rhinorrhea. Negative for ear pain, facial swelling and sinus pressure.   Eyes: Negative for photophobia, pain, discharge, redness and itching.  Cardiovascular: Negative for  chest pain, palpitations and leg swelling.       PAF.AICD.  Gastrointestinal: Negative for nausea, abdominal pain, abdominal distention and rectal pain.       HX. Fecal incontinence. Brown staining when he wipes rectum. Hasa postprandial fecal urgency. Loss of appetite since fx of C2.  Endocrine: Negative.   Genitourinary:       Hx urinary incontinence. Leakage at night.  Musculoskeletal: Positive for back pain and myalgias. Negative for neck pain.       S/P fx C2 and surgery for stabilization. Low back discomfort.  Neurological:       Right foot drop  Hematological: Negative.   Psychiatric/Behavioral: Negative.        Objective:BP 152/90  Pulse 88  Ht 5\' 10"  (1.778 m)  Wt 144 lb (65.318 kg)  BMI 20.66 kg/m2    Physical Exam  Constitutional:  Frail elderly male.  HENT:  Right Ear: External ear normal.  Left Ear: External ear normal.  Nose: Nose normal.  Eyes: Conjunctivae and EOM are normal. Left eye exhibits no discharge.  Neck: Normal range of motion. Neck supple. No JVD present. No tracheal deviation present. No thyromegaly present.  Wearing cervical collar.  Cardiovascular: Normal rate and regular rhythm.  Exam reveals no gallop and no friction rub.   No murmur heard. Pulmonary/Chest: Effort normal. No respiratory distress. He has  no wheezes. He has no rales.  Abdominal: Soft. Bowel sounds are normal. He exhibits no distension and no mass. There is no tenderness.  Musculoskeletal: Normal range of motion. He exhibits no edema and no tenderness.  Right AFO. Wearing the soft neck collar.  Lymphadenopathy:    He has no cervical adenopathy.  Neurological: He is alert. He has normal reflexes. No cranial nerve deficit.  Right foot drop.  Skin: Skin is warm and dry. Rash noted.  Psychiatric: He has a normal mood and affect. His behavior is normal. Judgment and thought content normal.          Assessment & Plan:  1. Rash and nonspecific skin eruption - triamcinolone  (KENALOG) 0.025 % cream; Apply 1 application topically 2 (two) times daily. Apply sparingly twice daily to rash on forearms  Dispense: 60 g; Refill: 3  2. Hpertension Now higher -Resume: lisinopril (PRINIVIL,ZESTRIL) 20 MG tablet; One daily to help control BP  Dispense: 90 tablet; Refill: 3  3. Fracture of second cervical vertebra, with routine healing, subsequent encounter Stable. Continue soft collar  4. Unspecified constipation Continue current mediccations  5. Unspecified urinary incontinence - Ambulatory referral to Urology  6. Lumbago stable

## 2013-08-09 NOTE — Patient Instructions (Signed)
Continue current meds and resume lisinopril.

## 2013-08-11 ENCOUNTER — Telehealth: Payer: Self-pay

## 2013-08-11 NOTE — Telephone Encounter (Signed)
Spoke with Jacob Rosales about appt with Dr. Sherron Monday for Dec 18th at 3:00. Fax notes to [phone]. They will mail new patient packet, bring Insurance cards, photo ID, list of medications.

## 2013-09-26 ENCOUNTER — Telehealth: Payer: Self-pay | Admitting: Internal Medicine

## 2013-09-26 NOTE — Telephone Encounter (Signed)
Call to state that pt BP elevated at 185/118. Pt asymptomatic with no complaints. Due for evening dose of Lisinopril 20 mg QD. Asked nurse to give this and recheck BP thereafter.  Call with questions.   Regino Bellow , MD

## 2013-09-28 ENCOUNTER — Encounter: Payer: Self-pay | Admitting: Internal Medicine

## 2013-09-28 ENCOUNTER — Ambulatory Visit (INDEPENDENT_AMBULATORY_CARE_PROVIDER_SITE_OTHER): Payer: Medicare Other | Admitting: Internal Medicine

## 2013-09-28 ENCOUNTER — Ambulatory Visit (INDEPENDENT_AMBULATORY_CARE_PROVIDER_SITE_OTHER): Payer: Medicare Other | Admitting: *Deleted

## 2013-09-28 VITALS — BP 148/88 | HR 92 | Ht 70.0 in | Wt 145.1 lb

## 2013-09-28 DIAGNOSIS — I10 Essential (primary) hypertension: Secondary | ICD-10-CM

## 2013-09-28 DIAGNOSIS — I5022 Chronic systolic (congestive) heart failure: Secondary | ICD-10-CM

## 2013-09-28 DIAGNOSIS — I4891 Unspecified atrial fibrillation: Secondary | ICD-10-CM

## 2013-09-28 DIAGNOSIS — I2589 Other forms of chronic ischemic heart disease: Secondary | ICD-10-CM

## 2013-09-28 DIAGNOSIS — Z95810 Presence of automatic (implantable) cardiac defibrillator: Secondary | ICD-10-CM

## 2013-09-28 DIAGNOSIS — I442 Atrioventricular block, complete: Secondary | ICD-10-CM

## 2013-09-28 NOTE — Patient Instructions (Signed)
Your physician wants you to follow-up in: 12 months Dr Knox Saliva will receive a reminder letter in the mail two months in advance. If you don't receive a letter, please call our office to schedule the follow-up appointment.   Remote monitoring is used to monitor your Pacemaker or ICD from home. This monitoring reduces the number of office visits required to check your device to one time per year. It allows [place] to keep an eye on the functioning of your device to ensure it is working properly. You are scheduled for a device check from home on 12/30/13. You may send your transmission at any time that day. If you have a wireless device, the transmission will be sent automatically. After your physician reviews your transmission, you will receive a postcard with your next transmission date.

## 2013-09-28 NOTE — Assessment & Plan Note (Signed)
His blood pressure is minimally elevated today. He'll maintain a low-sodium diet, and continue his current medications. He states that it is under better control when he checks it at home.

## 2013-09-28 NOTE — Progress Notes (Signed)
HPI [name] returns today for followup. He is a very pleasant 80 year old man with a history of coronary artery disease, status post myocardial infarction, status post bypass surgery, status post ICD implantation. In the interim, he has sustained a cervical spine fracture. He was initially quite incapacitated. He denies chest pain, shortness of breath, or syncope. No peripheral edema. He has class II heart failure symptoms. He denies any ICD shock. Allergies  Allergen Reactions  . Warfarin Sodium Other (See Comments)    REACTION: Stomach bleeding  . Amlodipine Besylate Swelling     Current Outpatient Prescriptions  Medication Sig Dispense Refill  . Calcium Carbonate-Vitamin D (CALCIUM 600 + D PO) Take 1 tablet by mouth 2 (two) times daily.      . carvedilol (COREG) 25 MG tablet Take 25 mg by mouth 2 (two) times daily with a meal.      . cholecalciferol (VITAMIN D) 1000 UNITS tablet Take 1,000 Units by mouth daily.        Marland Kitchen dipyridamole-aspirin (AGGRENOX) 200-25 MG per 12 hr capsule Take one tablet twice daily to reduce stroke  180 capsule  3  . lisinopril (PRINIVIL,ZESTRIL) 20 MG tablet One daily to help control BP  90 tablet  3  . LORazepam (ATIVAN) 1 MG tablet Take 1 mg by mouth at bedtime as needed (for sleep).      . senna-docusate (SENOKOT-S) 8.6-50 MG per tablet Take 2 tablets by mouth daily.      . simvastatin (ZOCOR) 20 MG tablet Take 20 mg by mouth at bedtime.        . triamcinolone (KENALOG) 0.025 % cream Apply 1 application topically 2 (two) times daily. Apply sparingly twice daily to rash on forearms  60 g  3   No current facility-administered medications for this visit.     Past Medical History  Diagnosis Date  . Complete AV block   . Chronic systolic heart failure   . Cardiomyopathy, ischemic   . Automatic implantable cardiac defibrillator in situ   . CAD (coronary artery disease) of artery bypass graft   . Prostate cancer     S/P radiation rx  . Osteoporosis    A/P Vertebral compression fx's  . Hypertension   . Dyslipidemia   . TIA (transient ischemic attack)     H/o  . Lumbago 10/05/2012  . Cervicalgia 04/06/2012  . 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 Unspecified vitamin D deficiency 10/07/2011  . Unspecified pruritic disorder 10/07/2011  . Rash and other nonspecific skin eruption 10/07/2011  . Basal cell carcinoma of skin of lower limb, including hip 04/08/2011  . Squamous cell carcinoma of skin of lower limb, including hip 04/08/2011  . Unspecified sinusitis (chronic) 10/08/2010  . Impotence of organic origin 10/08/2010  . Seborrhea 04/09/2010  . Insomnia, unspecified 04/09/2010  . Altered mental status 01/12/2010  . Full incontinence of feces 10/09/2009  . Atrial fibrillation 05/15/2006  . Unspecified urinary incontinence 05/15/2006  . Sebaceous cyst 10/09/2005  . Pathologic fracture of vertebrae 05/16/1991  . Fracture of C2 vertebra, closed     s/p cervical fusion 05/2013    ROS:   All systems reviewed and negative except as noted in the HPI.   Past Surgical History  Procedure Laterality Date  . Coronary artery bypass graft    . Hernia repair    . Kyphosis surgery    . Biv-icd  11/21/2010    [employer] ICD Model#CD3231-40 [phone]  . Cardiac  defibrillator placement    . Cyst excision  10/2012    back Dr. Wilhemina Bonito  . Cyst removal neck  12/2012    basel cell (right) Dr. Sarajane Jews  . Colonoscopy  2006  . Dexa  April 2010  . Posterior cervical fusion/foraminotomy N/A 05/21/2013    Procedure: Cervical One, Cervical Two, Cervical Three Posterior cervical fusion with lateral mass fixation;  Surgeon: Charlie Pitter, MD;  Location: [facility];  Service: Neurosurgery;  Laterality: N/A;  POSTERIOR CERVICAL FUSION/FORAMINOTOMY LEVEL 2     Family History  Problem Relation Age of Onset  . Coronary artery disease Neg Hx   . Heart disease Mother      History   Social History  . Marital Status: Married    Spouse Name: N/A     Number of Children: N/A  . Years of Education: N/A   Occupational History  . Retired Chief Financial Officer    Social History Main Topics  . Smoking status: Former Smoker    Quit date: 01/13/1962  . Smokeless tobacco: Never Used  . Alcohol Use: 1.2 oz/week    1 Glasses of wine, 1 Cans of beer per week  . Drug Use: No  . Sexual Activity: Not on file   Other Topics Concern  . Not on file   Social History Narrative   Patient is Married [name]), retired Chief Financial Officer. [facility].   Smoking cigarettes minimal alcohol history .   Patient has Advanced planning documents:  Living Will, DNR           BP 148/88  Pulse 92  Ht 5\' 10"  (1.778 m)  Wt 145 lb 1.9 oz (65.826 kg)  BMI 20.82 kg/m2  Physical Exam:  Stable but frail appearing elderly man, NAD HEENT: Unremarkable Neck:  7 cm JVD, no thyromegally Lungs:  Clear with no wheezes, rales, or rhonchi. HEART:  Regular rate rhythm, no murmurs, no rubs, no clicks Abd:  soft, positive bowel sounds, no organomegally, no rebound, no guarding Ext:  2 plus pulses, no edema, no cyanosis, no clubbing Skin:  No rashes no nodules Neuro:  CN II through XII intact, motor grossly intact  EKG - normal sinus rhythm with biventricular pacing.  DEVICE  Normal device function.  See PaceArt for details.   Assess/Plan:

## 2013-09-28 NOTE — Assessment & Plan Note (Signed)
His congestive heart failure symptoms are currently class II. He is limited by his mobility. He will continue his current medical therapy, and maintain a low-sodium diet.

## 2013-09-28 NOTE — Assessment & Plan Note (Signed)
We initially had some trouble interrogating his [employer] ICD. However it was ultimately found to be working satisfactorily.

## 2013-09-29 ENCOUNTER — Non-Acute Institutional Stay: Payer: Medicare Other | Admitting: Geriatric Medicine

## 2013-09-29 ENCOUNTER — Encounter: Payer: Self-pay | Admitting: Geriatric Medicine

## 2013-09-29 ENCOUNTER — Encounter: Payer: Self-pay | Admitting: Internal Medicine

## 2013-09-29 VITALS — BP 144/92 | HR 84 | Ht 70.0 in | Wt 146.0 lb

## 2013-09-29 DIAGNOSIS — F411 Generalized anxiety disorder: Secondary | ICD-10-CM

## 2013-09-29 DIAGNOSIS — I10 Essential (primary) hypertension: Secondary | ICD-10-CM

## 2013-09-29 DIAGNOSIS — I5022 Chronic systolic (congestive) heart failure: Secondary | ICD-10-CM

## 2013-09-29 DIAGNOSIS — R21 Rash and other nonspecific skin eruption: Secondary | ICD-10-CM

## 2013-09-29 DIAGNOSIS — R4189 Other symptoms and signs involving cognitive functions and awareness: Secondary | ICD-10-CM

## 2013-09-29 DIAGNOSIS — M8440XA Pathological fracture, unspecified site, initial encounter for fracture: Secondary | ICD-10-CM

## 2013-09-29 DIAGNOSIS — M810 Age-related osteoporosis without current pathological fracture: Secondary | ICD-10-CM

## 2013-09-29 DIAGNOSIS — M8080XA Other osteoporosis with current pathological fracture, unspecified site, initial encounter for fracture: Secondary | ICD-10-CM

## 2013-09-29 LAB — MDC_IDC_ENUM_SESS_TYPE_INCLINIC
Brady Statistic RA Percent Paced: 90 %
Brady Statistic RV Percent Paced: 99.36 %
Date Time Interrogation Session: 20150127181610
HIGH POWER IMPEDANCE MEASURED VALUE: 35.0605
Implantable Pulse Generator Serial Number: 631860
Lead Channel Impedance Value: 312.5 Ohm
Lead Channel Pacing Threshold Amplitude: 0.75 V
Lead Channel Pacing Threshold Amplitude: 1 V
Lead Channel Pacing Threshold Amplitude: 1.25 V
Lead Channel Pacing Threshold Amplitude: 1.25 V
Lead Channel Pacing Threshold Pulse Width: 0.5 ms
Lead Channel Pacing Threshold Pulse Width: 0.5 ms
Lead Channel Pacing Threshold Pulse Width: 0.8 ms
Lead Channel Pacing Threshold Pulse Width: 1 ms
Lead Channel Pacing Threshold Pulse Width: 1 ms
Lead Channel Setting Pacing Amplitude: 2 V
Lead Channel Setting Pacing Amplitude: 3 V
Lead Channel Setting Pacing Pulse Width: 1 ms
MDC IDC MSMT BATTERY REMAINING LONGEVITY: 22.8 mo
MDC IDC MSMT LEADCHNL LV IMPEDANCE VALUE: 537.5 Ohm
MDC IDC MSMT LEADCHNL RA IMPEDANCE VALUE: 325 Ohm
MDC IDC MSMT LEADCHNL RA PACING THRESHOLD AMPLITUDE: 0.75 V
MDC IDC MSMT LEADCHNL RA PACING THRESHOLD PULSEWIDTH: 0.8 ms
MDC IDC MSMT LEADCHNL RA SENSING INTR AMPL: 4 mV
MDC IDC MSMT LEADCHNL RV PACING THRESHOLD AMPLITUDE: 1 V
MDC IDC MSMT LEADCHNL RV SENSING INTR AMPL: 12 mV
MDC IDC SET LEADCHNL RV PACING AMPLITUDE: 2.5 V
MDC IDC SET LEADCHNL RV PACING PULSEWIDTH: 0.5 ms
MDC IDC SET LEADCHNL RV SENSING SENSITIVITY: 2 mV
MDC IDC SET ZONE DETECTION INTERVAL: 335 ms
Zone Setting Detection Interval: 300 ms

## 2013-09-29 MED ORDER — LORAZEPAM 1 MG PO TABS: 1.0000 mg | ORAL_TABLET | Freq: Every evening | ORAL | Status: DC | PRN

## 2013-09-29 NOTE — Assessment & Plan Note (Signed)
Patient with episode of elevated blood pressure several days ago, diastolic readings remain slightly elevated. No medication adjustment recommended by cardiology yesterday, continue to monitor.

## 2013-09-29 NOTE — Assessment & Plan Note (Signed)
Continues taking lorazepam 1 mg at bedtime. Have recommended decreasing his dose to 0.5 mg in light of possible early cognitive impairment

## 2013-09-29 NOTE — Assessment & Plan Note (Signed)
Arm rash resolved with use of triamcinolone. Patient has been instructed to use this medication very sparingly. No rash on his arms today, encouraged to continue moisturizing with Aveeno emollient cream.

## 2013-09-29 NOTE — Assessment & Plan Note (Signed)
Patient's appearance today is frail, he also exhibits mild STM deficit and difficulty word finding. He appears to have great difficulty processing and delivery information. Spouse notes this has been more prominent in the last few days since his episode of elevated blood pressure. He has been taking 1 mg of lorazepam at bedtime for long time, I wonder if this medication is impacting his cognitive status. Have asked him to try reducing dose (.5mg ) - he is agreeable.

## 2013-09-29 NOTE — Progress Notes (Signed)
Patient ID: Jacob Rosales, male   DOB: 10-May-1928, 80 y.o.   MRN: [id number] [facility] [id number]  Code Status: DNR      Contact Information   Name Relation Home Work Mobile   Denman,[name] Spouse [phone]     [name],[name] Daughter [phone]        Chief Complaint  Patient presents with  . Medical Managment of Chronic Issues    to talk about medications for his rash, go over his appt with Dr. Ladona Ridgel, talk about his blood pressure.     HPI: This is a 80 y.o. male resident of [facility]  [facility]dependent [facility]  section.  Evaluation is requested today to discuss medications and several other issues.  Last visit: Rash and nonspecific skin eruption Left arm with rash of multiple flat small dry patches, left clavicle with dry patch as well. Dry skin vs. psoriasis. Will start with emollient cream. If not effective, recommend hydrocortisone.  C2 cervical fracture Status post fall 05/03/2013 with C2 fracture, followed by posterior cervical fusion September 19. Neck remains immobilized in collar, patient due for followup with Dr. Jordan Likes November 25.  Patient to longer requires hydrocodone for pain management.   Patient has recently seen Dr. Jacquelyne Balint for urologic issues and Dr. Ladona Ridgel for cardiac evaluation. Dr. Jacquelyne Balint initially treated patient's urge incontinence with Rapiflo. This medication was not effective. Patient returned January 21, medication was changed to [facility], patient also underwent a bladder scan at that visit no significant urinary retention was noted. He is to return in 3 weeks  Patient was evaluated by Dr. Sharrell Ku yesterday. Permanent pacemaker is functioning adequately, patient's heart failure with class II symptoms adequately controlled, no medication changes recommended. Dr. Ladona Ridgel notes patient's blood pressure was minimally elevated during visit yesterday did not recommend any medication changes.     Allergies    Allergen Reactions  . Warfarin Sodium Other (See Comments)    REACTION: Stomach bleeding  . Amlodipine Besylate Swelling      Medication List       This list is accurate as of: 09/29/13 12:26 PM.  Always use your most recent med list.               CALCIUM 600 + D PO  Take 1 tablet by mouth 2 (two) times daily.     carvedilol 25 MG tablet  Commonly known as:  COREG  Take 25 mg by mouth 2 (two) times daily with a meal.     cholecalciferol 1000 UNITS tablet  Commonly known as:  VITAMIN D  Take 1,000 Units by mouth daily.     dipyridamole-aspirin 200-25 MG per 12 hr capsule  Commonly known as:  AGGRENOX  Take one tablet twice daily to reduce stroke     lisinopril 20 MG tablet  Commonly known as:  PRINIVIL,ZESTRIL  One daily to help control BP     LORazepam 1 MG tablet  Commonly known as:  ATIVAN  Take 1 tablet (1 mg total) by mouth at bedtime as needed (for sleep).     MYRBETRIQ 50 MG Tb24 tablet  Generic drug:  mirabegron ER  Take 50 mg by mouth daily.     senna-docusate 8.6-50 MG per tablet  Commonly known as:  Senokot-S  Take 2 tablets by mouth daily.     simvastatin 20 MG tablet  Commonly known as:  ZOCOR  Take 20 mg by mouth at bedtime.     triamcinolone 0.025 %  cream  Commonly known as:  KENALOG  Apply 1 application topically 2 (two) times daily. Apply sparingly twice daily to rash on forearms        DATA REVIEWED  Radiologic Exams 05/04/2013:   CT HEAD IMPRESSION: No acute intracranial abnormality.   CT CERVICAL SPINE IMPRESSION:     1. Comminuted C2 vertebral body and bilateral pedicle fracture. Mild displacement of fracture fragments and small volume epidural hematoma at the C2 level.   2. Osteopenia. No other acute cervical spine or skull base fracture identified.                             [facility]  05/05/2013 Cervical Spine: Limited views- no acute fracture noted   [facility] 06/10/2013  cervical spine x-ray: Stable appearance of posterior cervical fusion with instrumentation. Overall alignment is maintained  Cardiovascular Exams:   Laboratory Studies  [facility] 09/29/2012 glucose 82, BUN 23, creatinine 1.39, sodium 138, potassium 4.4. Protein/LFTs WNL             Total cholesterol 143, triglycerides 72, HDL 52, LDL 77 03/30/2013 PSA .[facility] 05/03/2013 hemoglobin 14.3, hematocrit 42.0             Glucose 116, BUN 20, creatinine 1.4, sodium 132, potassium 4.7  05/21/2013 WBC 7.9, hemoglobin 14.8, hematocrit 42.4, platelets 192  Glucose 91, BUN 31, creatinine 0.2, sodium 131, potassium 4.5  Lab Results-[facility]  Component Value Date   CHOL 159 06/29/2013   TRIG 76 06/29/2013   HDL 51 06/29/2013   LDLCALC 89 06/29/2013        NA 137 06/29/2013   K 4.4 06/29/2013   CREATININE 1.4* 06/29/2013   BUN 28* 06/29/2013        REVIEW OF SYSTEMS DATA OBTAINED: from patient,spouse GENERAL: Feels "OK"   No fevers, fatigue, appetite is fair SKIN: No itch, rash or open wounds EYES: No eye pain, dryness or itching  No change in vision EARS: No earache, tinnitus, change in hearing NOSE: No congestion, drainage or bleeding MOUTH/THROAT: No mouth or tooth pain  No sore throat    No difficulty chewing or swallowing RESPIRATORY: No cough, wheezing, spouse notes shortness of breath with conversation for the last few days   CARDIAC: No chest pain, palpitations  No edema. GI: No abdominal pain  No N/V/D. Constipation resolved  No heartburn or reflux   GU: No dysuria, increased frequency or urgency  No change in urine volume or character  Nocturia 2-4 times/night  MUSCULOSKELETAL: Mild scalp pain  No joint pain, swelling or stiffness No muscle ache, pain, weakness  Gait is unsteady, must use walker AAT  NEUROLOGIC: No dizziness, fainting, numbness, tinging.  No change in mental status.   PSYCHIATRIC: Denies anxiety, depression.    Physical  Exam Filed Vitals:   09/29/13 1137  BP: 144/92  Pulse: 84  Height: 5\' 10"  (1.778 m)  Weight: 146 lb (66.225 kg)  Body mass index is 20.95 kg/(m^2).  GENERAL APPEARANCE: No acute distress, appropriately groomed, Thin, frail body habitus. Alert, pleasant, conversant. Has gained about 5 pounds since rehabilitation discharge  SKIN: No diaphoresis. Left arm rash is resolved HEAD: Normocephalic, atraumatic EYES: Right subconjunctival hemorrhage, otherwise Conjunctiva/lids clear. Pupils round, reactive. Marland Kitchen   EARS:  Hearing  grossly normal. NOSE: No deformity or discharge. MOUTH/THROAT: Lips w/o lesions. Oral mucosa, tongue moist, w/o lesion.   LYMPHATICS: No head, neck or supraclavicular adenopathy RESPIRATORY: Breathing is even, unlabored at rest, slightly labored with conversation. Lung sounds are clear and full.   CARDIOVASCULAR: Heart RRR. No murmur or extra heart sounds             EDEMA: No peripheral or periorbital edema. No ascites MUSCULOSKELETAL: Moves all extremities with full ROM, strength and tone. Back with kyphosis. No scoliosis or spinal process tenderness. Gait steady with walker  NEUROLOGIC: Oriented to time, place, person. Speech is halting, slower than usual. He has difficulty with word finding (new)   PSYCHIATRIC:  Mood and behavior appropriate to situation   ASSESSMENT/PLAN  Rash and nonspecific skin eruption Arm rash resolved with use of triamcinolone. Patient has been instructed to use this medication very sparingly. No rash on his arms today, encouraged to continue moisturizing with Aveeno emollient cream.  Generalized anxiety disorder Continues taking lorazepam 1 mg at bedtime. Have recommended decreasing his dose to 0.5 mg in light of possible early cognitive impairment  Chronic systolic heart failure Class II symptom most prominent is dyspnea with minimal activity including conversation. Spouse notes this has been occurring more since episode of elevated blood  pressure earlier this week. No sign of volume overload. Will add a BNP to routine scheduled labs  Hpertension Patient with episode of elevated blood pressure several days ago, diastolic readings remain slightly elevated. No medication adjustment recommended by cardiology yesterday, continue to monitor.  Cognitive change Patient's appearance today is frail, he also exhibits mild STM deficit and difficulty word finding. He appears to have great difficulty processing and delivery information. Spouse notes this has been more prominent in the last few days since his episode of elevated blood pressure. He has been taking 1 mg of lorazepam at bedtime for long time, I wonder if this medication is impacting his cognitive status. Have asked him to try reducing dose (.5mg ) - he is agreeable.  Osteoporosis with fracture Last DEXA scan greater than 2 years ago, will repeat.  The patient asks about use of Forteo(family member wonders why he is not taking his medication).  Will defer this discussion until DEXA scan results are reviewed.   Time: 60 minutes,>50% spent counseling/or care coordination  Follow up: As scheduled in clinic w/ Dr.Green.   Miski Feldpausch T.Airen Stiehl, NP-C 09/29/2013

## 2013-09-29 NOTE — Assessment & Plan Note (Signed)
Class II symptom most prominent is dyspnea with minimal activity including conversation. Spouse notes this has been occurring more since episode of elevated blood pressure earlier this week. No sign of volume overload. Will add a BNP to routine scheduled labs

## 2013-10-01 ENCOUNTER — Telehealth: Payer: Self-pay

## 2013-10-01 LAB — MDC_IDC_ENUM_SESS_TYPE_REMOTE
Battery Remaining Longevity: 26 mo
Battery Remaining Percentage: 51 %
Battery Voltage: 2.89 V
Brady Statistic AP VP Percent: 89 %
Brady Statistic AS VP Percent: 10 %
Brady Statistic RA Percent Paced: 89 %
Date Time Interrogation Session: 20150128061203
HighPow Impedance: 37 Ohm
Implantable Pulse Generator Serial Number: 631860
Lead Channel Impedance Value: 310 Ohm
Lead Channel Impedance Value: 340 Ohm
Lead Channel Pacing Threshold Amplitude: 1.25 V
Lead Channel Pacing Threshold Pulse Width: 0.8 ms
Lead Channel Sensing Intrinsic Amplitude: 4 mV
Lead Channel Setting Pacing Amplitude: 2 V
Lead Channel Setting Pacing Amplitude: 2.5 V
Lead Channel Setting Pacing Amplitude: 3 V
Lead Channel Setting Pacing Pulse Width: 0.5 ms
Lead Channel Setting Sensing Sensitivity: 2 mV
MDC IDC MSMT LEADCHNL LV IMPEDANCE VALUE: 600 Ohm
MDC IDC MSMT LEADCHNL LV PACING THRESHOLD PULSEWIDTH: 1 ms
MDC IDC MSMT LEADCHNL RA PACING THRESHOLD AMPLITUDE: 0.75 V
MDC IDC MSMT LEADCHNL RV PACING THRESHOLD AMPLITUDE: 1 V
MDC IDC MSMT LEADCHNL RV PACING THRESHOLD PULSEWIDTH: 0.5 ms
MDC IDC MSMT LEADCHNL RV SENSING INTR AMPL: 12 mV
MDC IDC SET LEADCHNL LV PACING PULSEWIDTH: 1 ms
MDC IDC SET ZONE DETECTION INTERVAL: 300 ms
MDC IDC STAT BRADY AP VS PERCENT: 1 %
MDC IDC STAT BRADY AS VS PERCENT: 1 %
Zone Setting Detection Interval: 335 ms

## 2013-10-01 NOTE — Telephone Encounter (Signed)
appt at [facility] 10/12/13 at 2:00 for Bone Density, appt with Claudette to review results 10/27/13 at 1:30. Gave patient date and time for both appts.

## 2013-10-04 NOTE — Assessment & Plan Note (Signed)
Last DEXA scan greater than 2 years ago, will repeat.  The patient asks about use of Forteo(family member wonders why he is not taking his medication).  Will defer this discussion until DEXA scan results are reviewed.

## 2013-10-05 LAB — LIPID PANEL
CHOLESTEROL: 131 mg/dL (ref 0–200)
HDL: 50 mg/dL (ref 35–70)
LDL Cholesterol: 67 mg/dL
TRIGLYCERIDES: 63 mg/dL (ref 40–160)

## 2013-10-05 LAB — HEPATIC FUNCTION PANEL
ALK PHOS: 35 U/L (ref 25–125)
ALT: 18 U/L (ref 10–40)
AST: 20 U/L (ref 14–40)
Bilirubin, Total: 0.7 mg/dL

## 2013-10-05 LAB — BASIC METABOLIC PANEL
BUN: 24 mg/dL — AB (ref 4–21)
Creatinine: 1.1 mg/dL (ref 0.6–1.3)
Glucose: 90 mg/dL
Potassium: 4 mmol/L (ref 3.4–5.3)
Sodium: 139 mmol/L (ref 137–147)

## 2013-10-06 ENCOUNTER — Encounter: Payer: Medicare Other | Admitting: Internal Medicine

## 2013-10-06 ENCOUNTER — Other Ambulatory Visit: Payer: Self-pay

## 2013-10-06 MED ORDER — POTASSIUM CHLORIDE CRYS ER 20 MEQ PO TBCR: EXTENDED_RELEASE_TABLET | ORAL | Status: DC

## 2013-10-06 MED ORDER — FUROSEMIDE 20 MG PO TABS
20.0000 mg | ORAL_TABLET | Freq: Every day | ORAL | Status: DC
Start: 2013-10-06 — End: 2013-10-21

## 2013-10-11 ENCOUNTER — Non-Acute Institutional Stay: Payer: Medicare Other | Admitting: Internal Medicine

## 2013-10-11 ENCOUNTER — Encounter: Payer: Self-pay | Admitting: Internal Medicine

## 2013-10-11 VITALS — BP 120/68 | HR 84 | Temp 98.6°F | Resp 16 | Ht 69.0 in | Wt 142.0 lb

## 2013-10-11 DIAGNOSIS — I10 Essential (primary) hypertension: Secondary | ICD-10-CM

## 2013-10-11 DIAGNOSIS — R32 Unspecified urinary incontinence: Secondary | ICD-10-CM

## 2013-10-11 DIAGNOSIS — I2589 Other forms of chronic ischemic heart disease: Secondary | ICD-10-CM

## 2013-10-11 DIAGNOSIS — C61 Malignant neoplasm of prostate: Secondary | ICD-10-CM

## 2013-10-11 DIAGNOSIS — I25810 Atherosclerosis of coronary artery bypass graft(s) without angina pectoris: Secondary | ICD-10-CM

## 2013-10-11 DIAGNOSIS — R159 Full incontinence of feces: Secondary | ICD-10-CM

## 2013-10-11 DIAGNOSIS — M810 Age-related osteoporosis without current pathological fracture: Secondary | ICD-10-CM

## 2013-10-11 DIAGNOSIS — M8440XA Pathological fracture, unspecified site, initial encounter for fracture: Secondary | ICD-10-CM

## 2013-10-11 DIAGNOSIS — M8080XA Other osteoporosis with current pathological fracture, unspecified site, initial encounter for fracture: Secondary | ICD-10-CM

## 2013-10-11 DIAGNOSIS — I4891 Unspecified atrial fibrillation: Secondary | ICD-10-CM

## 2013-10-11 MED ORDER — POTASSIUM CHLORIDE CRYS ER 20 MEQ PO TBCR: EXTENDED_RELEASE_TABLET | ORAL | Status: DC

## 2013-10-11 MED ORDER — CARVEDILOL 25 MG PO TABS: ORAL_TABLET | ORAL | Status: DC

## 2013-10-11 NOTE — Progress Notes (Signed)
Patient ID: Jacob Rosales, male   DOB: 04/08/1928, 80 y.o.   MRN: [id number]    Nursing Home Location:  [facility]   Place of Service: Clinic (12)  PCP: Estill Dooms, MD  Code Status: LIVING WILL  Allergies  Allergen Reactions   Warfarin Sodium Other (See Comments)    REACTION: Stomach bleeding   Amlodipine Besylate Swelling    Chief Complaint  Patient presents with   Medical Managment of Chronic Issues    Comprehensive exam:   A-Fib, CHF, blood pressure;  with wife today    HPI:  Blood pressure spike 2-3 weeks ago about 180/100. He was told he might have a heart issue. Saw Dr. Lovena Le. He said the SBP was minimally elevated and did not recommend any further change in  Medication. Defibrillator checked out OK.  Rash on the arms did much better on steroids instead of Aveeno. He is going back on the Aveeno for long term protection of the skin.  Using Myrbetriq from Dr. Suzanne Boron Diarmid. He is now dry more days.  Scheduled for Bone density due to loss in height.   Past Medical History  Diagnosis Date   Complete AV block    Chronic systolic heart failure    Cardiomyopathy, ischemic    Automatic implantable cardiac defibrillator in situ    CAD (coronary artery disease) of artery bypass graft    Prostate cancer     S/P radiation rx   Osteoporosis     A/P Vertebral compression fx's   Hypertension    Dyslipidemia    TIA (transient ischemic attack)     H/o   Lumbago 10/05/2012   Cervicalgia 04/06/2012   Disturbances of sensation of smell and taste 10/21/2011   Unspecified vitamin D deficiency 10/07/2011   Unspecified pruritic disorder 10/07/2011   Rash and other nonspecific skin eruption 10/07/2011   Basal cell carcinoma of skin of lower limb, including hip 04/08/2011   Squamous cell carcinoma of skin of lower limb, including hip 04/08/2011   Unspecified sinusitis (chronic) 10/08/2010   Impotence of organic origin 10/08/2010   Seborrhea  04/09/2010   Insomnia, unspecified 04/09/2010   Altered mental status 01/12/2010   Full incontinence of feces 10/09/2009   Atrial fibrillation 05/15/2006   Unspecified urinary incontinence 05/15/2006   Sebaceous cyst 10/09/2005   Pathologic fracture of vertebrae 05/16/1991   Fracture of C2 vertebra, closed     s/p cervical fusion 05/2013   Osteoporosis with fracture 04/05/2013    History of vertebal fracture.     Past Surgical History  Procedure Laterality Date   Coronary artery bypass graft     Hernia repair     Kyphosis surgery     Biv-icd  11/21/2010    [employer] ICD Model#CD3231-40 [phone]   Cardiac defibrillator placement     Cyst excision  10/2012    back Dr. Wilhemina Bonito   Cyst removal neck  12/2012    basel cell (right) Dr. Sarajane Jews   Colonoscopy  2006    polyps benign   Dexa  April 2010   Posterior cervical fusion/foraminotomy N/A 05/21/2013    Procedure: Cervical One, Cervical Two, Cervical Three Posterior cervical fusion with lateral mass fixation;  Surgeon: Charlie Pitter, MD;  Location: [facility];  Service: Neurosurgery;  Laterality: N/A;  POSTERIOR CERVICAL FUSION/FORAMINOTOMY LEVEL 2    CONSULTANTS Urol: MacDiarmid Cardio: G. Margaree MackintoshWilhemina Bonito NS: Sherley Bounds  PAST PROCEDURES 2006 Colonoscopy: benign polyps 2007 AICD  placement Radiation tx for CA prostate Replace ICD lead 2009 EGD: AV malformation with subsequent clip and cautery 12/2008 DXA: T score -1.7 hip. - 2.1 spine. Decreased scores from prior exam 5/13/121 CT head: old lacunar infarct of left inferior basal ganglia  CXR: cardiomegaly. Prior vertebroplasty of compression fx 01/05/10 Carotid doppleer; no stenoses  2D echo: LVEF 25$. Mild AoV regurgitation. RA mmildly dilated 01/13/10 CT head: no acute abnormality  Social History: History   Social History   Marital Status: Married    Spouse Name: N/A    Number of Children: N/A   Years of Education: N/A   Occupational History    Retired Art gallery manager    Social History Main Topics   Smoking status: Former Smoker    Quit date: 01/13/1962   Smokeless tobacco: Never Used   Alcohol Use: 1.2 oz/week    1 Glasses of wine, 1 Cans of beer per week   Drug Use: No   Sexual Activity: None   Other Topics Concern   None   Social History Narrative   Patient is Married Carney Bern), retired Art gallery manager. Lives with spouse in single level home at [facility] since 2010.   Smoked cigars, and pipe, stopped 1960's,  minimal alcohol history .   Patient has Advanced planning documents:  Living Will, DNR             Family History Family Status  Relation Status Death Age   Daughter Alive    Mother Deceased 52   Father Deceased 45   Sister Alive    Family History  Problem Relation Age of Onset   Coronary artery disease Neg Hx    Heart disease Mother      Medications: Patient's Medications  New Prescriptions   No medications on file  Previous Medications   CALCIUM CARBONATE-VITAMIN D (CALCIUM 600 + D PO)    Take 1 tablet by mouth 2 (two) times daily.   CARVEDILOL (COREG) 25 MG TABLET    Take 25 mg by mouth 2 (two) times daily with a meal.   CHOLECALCIFEROL (VITAMIN D) 1000 UNITS TABLET    Take 1,000 Units by mouth daily.     DIPYRIDAMOLE-ASPIRIN (AGGRENOX) 200-25 MG PER 12 HR CAPSULE    Take one tablet twice daily to reduce stroke   FUROSEMIDE (LASIX) 20 MG TABLET    Take 1 tablet (20 mg total) by mouth daily.   LISINOPRIL (PRINIVIL,ZESTRIL) 20 MG TABLET    One daily to help control BP   LORAZEPAM (ATIVAN) 1 MG TABLET    Take 1 tablet (1 mg total) by mouth at bedtime as needed (for sleep).   MIRABEGRON ER (MYRBETRIQ) 50 MG TB24 TABLET    Take 50 mg by mouth daily.   POTASSIUM CHLORIDE SA (K-DUR,KLOR-CON) 20 MEQ TABLET    Take 1/2 tablet ( ) daily   SENNA-DOCUSATE (SENOKOT-S) 8.6-50 MG PER TABLET    Take 2 tablets by mouth daily.   SIMVASTATIN (ZOCOR) 20 MG TABLET    Take 20 mg by mouth at  bedtime.     TRIAMCINOLONE (KENALOG) 0.025 % CREAM    Apply 1 application topically 2 (two) times daily. Apply sparingly twice daily to rash on forearms  Modified Medications   No medications on file  Discontinued Medications   No medications on file    Immunization History  Administered Date(s) Administered   Influenza Whole 06/02/2012, 06/01/2013   Pneumococcal Polysaccharide-23 09/03/2007   Td 09/03/2007     Review of Systems  Constitutional: Negative for fever, activity change, appetite change, fatigue and unexpected weight change.  HENT: Positive for rhinorrhea. Negative for ear pain, facial swelling and sinus pressure.   Eyes: Negative for photophobia, pain, discharge, redness and itching.  Cardiovascular: Negative for chest pain, palpitations and leg swelling.       PAF.AICD.  Gastrointestinal: Negative for nausea, abdominal pain, abdominal distention and rectal pain.       HX. Fecal incontinence. Brown staining when he wipes rectum. Hasa postprandial fecal urgency. Loss of appetite since fx of C2.  Endocrine: Negative.   Genitourinary:       Hx urinary incontinence. Leakage at night.  Musculoskeletal: Positive for back pain and myalgias. Negative for neck pain.       S/P fx C2 and surgery for stabilization. Low back discomfort.  Neurological:       Right foot drop  Hematological: Negative.   Psychiatric/Behavioral: Negative.       Filed Vitals:   10/11/13 1425  BP: 120/68  Pulse: 84  Temp: 98.6 F (37 C)  Resp: 16  Height: 5\' 9"  (1.753 m)  Weight: 142 lb (64.411 kg)   Physical Exam  Constitutional: He is oriented to person, place, and time.  Thin. Frail.  HENT:  Right Ear: External ear normal.  Left Ear: External ear normal.  Nose: Nose normal.  Mouth/Throat: No oropharyngeal exudate.  Eyes: Conjunctivae and EOM are normal. Pupils are equal, round, and reactive to light.  Corrective lenses  Neck: No JVD present. No tracheal deviation present. No  thyromegaly present.  Cardiovascular: Normal rate, regular rhythm, normal heart sounds and intact distal pulses.  Exam reveals no gallop and no friction rub.   No murmur heard. Respiratory: No respiratory distress. He has no wheezes. He exhibits no tenderness.  GI: He exhibits no distension and no mass. There is no tenderness. There is no rebound and no guarding.  Musculoskeletal: Normal range of motion. He exhibits no edema and no tenderness.  Stiff neck. Recent fracture of cervical spine.  Lymphadenopathy:    He has no cervical adenopathy.  Neurological: He is alert and oriented to person, place, and time. He has normal reflexes. No cranial nerve deficit. Coordination normal.  Skin: No rash noted. No erythema. No pallor.  Psychiatric: He has a normal mood and affect. His behavior is normal. Thought content normal.  OCD characteristics.       Labs reviewed: Nursing Home on 10/11/2013  Component Date Value Range Status   HM Colonoscopy 09/03/2007 Virtual colonoscopy; 2006 polyps removed benign   Final   HM Dexa Scan 12/01/2008 T score -1.7 spine, -2.1 hip, represent decreased BMD from prior scan   Final   Glucose 10/05/2013 90   Final   BUN 10/05/2013 24* 4 - 21 mg/dL Final   Creatinine 10/05/2013 1.1  0.6 - 1.3 mg/dL Final   Potassium 10/05/2013 4.0  3.4 - 5.3 mmol/L Final   Sodium 10/05/2013 139  137 - 147 mmol/L Final   Triglycerides 10/05/2013 63  40 - 160 mg/dL Final   Cholesterol 10/05/2013 131  0 - 200 mg/dL Final   HDL 10/05/2013 50  35 - 70 mg/dL Final   LDL Cholesterol 10/05/2013 67   Final   Alkaline Phosphatase 10/05/2013 35  25 - 125 U/L Final   ALT 10/05/2013 18  10 - 40 U/L Final   AST 10/05/2013 20  14 - 40 U/L Final   Bilirubin, Total 10/05/2013 0.7   Final  Office Visit on  09/28/2013  Component Date Value Range Status   Date Time Interrogation Session 09/29/2013 [id number]   Final   Pulse Generator Manufacturer 09/29/2013 [employer]    Final   Pulse Gen Model 09/29/2013 3231-40 Unify   Final   Pulse Gen Serial Number 09/29/2013 [id number]   Final   RV Sense Sensitivity 09/29/2013 2.0   Final   RA Pace Amplitude 09/29/2013 2.0   Final   RV Pace PulseWidth 09/29/2013 0.5   Final   RV Pace Amplitude 09/29/2013 2.5   Final   LV Pace PulseWidth 09/29/2013 1.0   Final   LV Pace Amplitude 09/29/2013 3.0   Final   Zone Setting Type Category 09/29/2013 VF   Final   Zone Detect Interval 09/29/2013 300   Final   Zone Setting Type Category 09/29/2013 VT   Final   Zone Setting Type Category 09/29/2013 VENTRICULAR_TACHYCARDIA_1   Final   Zone Detect Interval 09/29/2013 335   Final   RA Impedance 09/29/2013 325.0   Final   RA Amplitude 09/29/2013 4.0   Final   RA Pacing Amplitude 09/29/2013 0.75   Final   RA Pacing PulseWidth 09/29/2013 0.8   Final   RA Pacing Amplitude 09/29/2013 0.75   Final   RA Pacing PulseWidth 09/29/2013 0.8   Final   RV IMPEDANCE 09/29/2013 312.5   Final   RV Amplitude 09/29/2013 12.0   Final   RV Pacing Amplitude 09/29/2013 1.0   Final   RV Pacing PulseWidth 09/29/2013 0.5   Final   RV Pacing Amplitude 09/29/2013 1.0   Final   RV Pacing PulseWidth 09/29/2013 0.5   Final   HighPow Impedance 09/29/2013 [id number]   Final   LV Impedance 09/29/2013 537.5   Final   LV Pacing Amplitude 09/29/2013 1.25   Final   LV PACING PULSEWIDTH 09/29/2013 1.0   Final   LV Pacing Amplitude 09/29/2013 1.25   Final   LV PACING PULSEWIDTH 09/29/2013 1.0   Final   Battery Longevity 09/29/2013 22.8   Final   Brady RA Perc Paced 09/29/2013 90.0   Final   Brady RV Perc Paced 09/29/2013 99.36   Final   Eval Rhythm 09/29/2013 Ap/Bi-Vp   Final   Miscellaneous Comment 09/29/2013    Final                   Value:CRT-D device check in office by [employer]. Thresholds and sensing consistent with previous device measurements. Lead impedance trends stable over time. 9 mode switches--- <1% of time.  No ventricular arrhythmia episodes recorded. Patient bi-ventricularly                          pacing >99% of the time. Device programmed with appropriate safety margins. Heart failure diagnostics reviewed and trends are stable for patient. CorVue was up 08/29/13 x15 days. No changes made this session. Estimated longevity 1.9-2.28yrs.  Patient                          enrolled in remote follow up. Merlin 12/30/13 & ROV w/ GT in 60mo.  Clinical Support on 09/28/2013  Component Date Value Range Status   Date Time Interrogation Session 10/01/2013 [id number]   Final   Pulse Generator Manufacturer 10/01/2013 [employer]   Final   Pulse Gen Model 10/01/2013 3231-40 Unify   Final   Pulse Gen Serial Number 10/01/2013 [id number]   Final  RV Sense Sensitivity 10/01/2013 2   Final   RV Adaptation Mode 10/01/2013 Adaptive Sensing   Final   LV Pace PulseWidth 10/01/2013 1   Final   LV Pace Amplitude 10/01/2013 3   Final   LV Pace Capture Mode 10/01/2013 Fixed Pacing   Final   RA Pace Amplitude 10/01/2013 2   Final   RV Pace PulseWidth 10/01/2013 0.5   Final   RV Pace Amplitude 10/01/2013 2.5   Final   Zone Setting Type Category 10/01/2013 VF   Final   Zone Detect Interval 10/01/2013 300   Final   Zone Setting Type Category 10/01/2013 VENTRICULAR_TACHYCARDIA_1   Final   Zone Detect Interval 10/01/2013 335   Final   Zone Setting Type Category 10/01/2013 VENTRICULAR_TACHYCARDIA_2   Final   Lead Channel Status 10/01/2013    Final   LV Impedance 10/01/2013 600   Final   LV Pacing Amplitude 10/01/2013 1.25   Final   LV PACING PULSEWIDTH 10/01/2013 1   Final   Lead Channel Status 10/01/2013    Final   RA Impedance 10/01/2013 340   Final   RA Amplitude 10/01/2013 4   Final   RA Pacing Amplitude 10/01/2013 0.75   Final   RA Pacing PulseWidth 10/01/2013 0.8   Final   Lead Channel Status 10/01/2013    Final   RV IMPEDANCE 10/01/2013 310   Final   RV Amplitude  10/01/2013 12   Final   RV Pacing Amplitude 10/01/2013 1   Final   RV Pacing PulseWidth 10/01/2013 0.5   Final   HighPow Impedance 10/01/2013 37   Final   HighPow Imped Status 10/01/2013    Final   Battery Status 10/01/2013 MOS   Final   Battery Longevity 10/01/2013 26   Final   Battery Percent 10/01/2013 51   Final   Battery Voltage 10/01/2013 2.89   Final   Brady RA Perc Paced 10/01/2013 89   Final   Brady AP VP Percent 10/01/2013 89   Final   Brady AS VP Percent 10/01/2013 10   Final   Brady AP VS Percent 10/01/2013 1   Final   Brady AS VS Percent 10/01/2013 1   Final   Eval Rhythm 10/01/2013 Ap-Vp   Final   Miscellaneous Comment 10/01/2013    Final                   Value:Remote CRT-D device check. Thresholds and sensing consistent with previous device measurements. Lead impedance trends stable over time. No mode switch episodes recorded. No ventricular arrhythmia episodes recorded. Patient bi-ventricularly pacing >100 %                          of the time. Device programmed with appropriate safety margins. Heart failure diagnostics reviewed.  CorVue abnormal for 15 days is late December.    Estimated longevity 1.9 years. Next remote 01/03/14.    BNP; 034  Basic Metabolic Panel:  Recent Labs  05/03/13 1956 05/21/13 1313 06/29/13 10/05/13  NA 132* 131* 137 139  K 4.7 4.5 4.4 4.0  CL 97 95*  --   --   CO2  --  24  --   --   GLUCOSE 116* 91  --   --   BUN 28* 31* 28* 24*  CREATININE 1.40* 1.12 1.4* 1.1  CALCIUM  --  9.9  --   --    Liver Function Tests:  Recent  Labs  10/05/13  AST 20  ALT 18  ALKPHOS 35     CBC:  Recent Labs  05/03/13 1956 05/21/13 1313  WBC  --  7.9  HGB 14.3 14.8  HCT 42.0 42.4  MCV  --  92.4  PLT  --  192   A1C: Lab Results  Component Value Date   HGBA1C  Value: 5.7 (NOTE)                                                                       According to the ADA Clinical Practice Recommendations for 2011, when HbA1c  is used as a screening test:   >=6.5%   Diagnostic of Diabetes Mellitus           (if abnormal result  is confirmed)  5.7-6.4%   Increased risk of developing Diabetes Mellitus  References:Diagnosis and Classification of Diabetes Mellitus,Diabetes [id number] 1):S62-S69 and Standards of Medical Care in         Diabetes - 2011,Diabetes [id number]  (Suppl 1):S11-S61.* 01/13/2010   Lipid Panel:  Recent Labs  06/29/13 10/05/13  CHOL 159 131  HDL 51 50  LDLCALC 89 67  TRIG 76 63     Assessment/Plan  CARDIOMYOPATHY, ISCHEMIC - continue current medications. Was started on furosemide and KCl last week due to elevation in BNP and weight. Has lost about 4# since starting furosemide.  CAD (coronary artery disease) of artery bypass graft: no angina  Atrial fibrillation: controlled  Hypertension: controlled  Unspecified urinary incontinence: improved on Myrbetriq  Full incontinence of feces: unchanged  Prostate cancer: in remission  Osteoporosis with fracture: scheduled DXA

## 2013-10-11 NOTE — Patient Instructions (Signed)
Continue current medications. 

## 2013-10-12 ENCOUNTER — Ambulatory Visit
Admission: RE | Admit: 2013-10-12 | Discharge: 2013-10-12 | Disposition: A | Discharge status: Home / Self Care | Payer: Medicare Other | Source: Ambulatory Visit | Attending: Geriatric Medicine | Admitting: Geriatric Medicine

## 2013-10-12 DIAGNOSIS — M8080XA Other osteoporosis with current pathological fracture, unspecified site, initial encounter for fracture: Secondary | ICD-10-CM

## 2013-10-21 ENCOUNTER — Other Ambulatory Visit: Payer: Self-pay | Admitting: *Deleted

## 2013-10-21 DIAGNOSIS — I2589 Other forms of chronic ischemic heart disease: Secondary | ICD-10-CM

## 2013-10-21 MED ORDER — FUROSEMIDE 20 MG PO TABS: 20.0000 mg | ORAL_TABLET | Freq: Every day | ORAL | Status: DC

## 2013-10-21 MED ORDER — POTASSIUM CHLORIDE CRYS ER 20 MEQ PO TBCR: EXTENDED_RELEASE_TABLET | ORAL | Status: DC

## 2013-10-25 ENCOUNTER — Other Ambulatory Visit: Payer: Self-pay | Admitting: *Deleted

## 2013-10-25 DIAGNOSIS — I2589 Other forms of chronic ischemic heart disease: Secondary | ICD-10-CM

## 2013-10-25 MED ORDER — POTASSIUM CHLORIDE CRYS ER 20 MEQ PO TBCR: EXTENDED_RELEASE_TABLET | ORAL | Status: DC

## 2013-10-26 ENCOUNTER — Encounter: Payer: Self-pay | Admitting: *Deleted

## 2013-10-27 ENCOUNTER — Encounter: Payer: Self-pay | Admitting: Geriatric Medicine

## 2013-10-27 ENCOUNTER — Other Ambulatory Visit: Payer: Self-pay | Admitting: *Deleted

## 2013-10-27 DIAGNOSIS — I2589 Other forms of chronic ischemic heart disease: Secondary | ICD-10-CM

## 2013-10-27 MED ORDER — FUROSEMIDE 20 MG PO TABS: 20.0000 mg | ORAL_TABLET | Freq: Every day | ORAL | Status: DC

## 2013-10-27 MED ORDER — POTASSIUM CHLORIDE CRYS ER 20 MEQ PO TBCR: EXTENDED_RELEASE_TABLET | ORAL | Status: DC

## 2013-11-03 ENCOUNTER — Encounter: Payer: Self-pay | Admitting: Geriatric Medicine

## 2013-11-03 ENCOUNTER — Non-Acute Institutional Stay: Payer: Medicare Other | Admitting: Geriatric Medicine

## 2013-11-03 VITALS — BP 120/72 | HR 92 | Wt 143.0 lb

## 2013-11-03 DIAGNOSIS — M8440XA Pathological fracture, unspecified site, initial encounter for fracture: Secondary | ICD-10-CM

## 2013-11-03 DIAGNOSIS — F411 Generalized anxiety disorder: Secondary | ICD-10-CM

## 2013-11-03 DIAGNOSIS — M810 Age-related osteoporosis without current pathological fracture: Secondary | ICD-10-CM

## 2013-11-03 DIAGNOSIS — I2589 Other forms of chronic ischemic heart disease: Secondary | ICD-10-CM

## 2013-11-03 DIAGNOSIS — M8080XA Other osteoporosis with current pathological fracture, unspecified site, initial encounter for fracture: Secondary | ICD-10-CM

## 2013-11-03 DIAGNOSIS — R4189 Other symptoms and signs involving cognitive functions and awareness: Secondary | ICD-10-CM

## 2013-11-03 NOTE — Progress Notes (Signed)
Patient ID: Jacob Rosales, male   DOB: 1928/05/20, 80 y.o.   MRN: [id number]   [facility] [phone])  Code Status: DNR, MOST form Contact Information   Name Relation Home Work [place] Spouse [phone]     [place] Daughter [phone]        Chief Complaint  Patient presents with  . Medical Managment of Chronic Issues    osteoporosis, discuss bone density done 10/12/13. Anxiety, review Lorazepam    HPI: This is a 80 y.o. male resident of [facility]  section.  Returns to clinic today to discuss results of DXA bone scan, and review use of lorazepam for anxiety.  Last visit: Rash and nonspecific skin eruption Arm rash resolved with use of triamcinolone. Patient has been instructed to use this medication very sparingly. No rash on his arms today, encouraged to continue moisturizing with Aveeno emollient cream.  Generalized anxiety disorder Continues taking lorazepam 1 mg at bedtime. Have recommended decreasing his dose to 0.5 mg in light of possible early cognitive impairment  Chronic systolic heart failure Class II symptom most prominent is dyspnea with minimal activity including conversation. Spouse notes this has been occurring more since episode of elevated blood pressure earlier this week. No sign of volume overload. Will add a BNP to routine scheduled labs  Hpertension Patient with episode of elevated blood pressure several days ago, diastolic readings remain slightly elevated. No medication adjustment recommended by cardiology yesterday, continue to monitor.  Cognitive change Patient's appearance today is frail, he also exhibits mild STM deficit and difficulty word finding. He appears to have great difficulty processing and delivery information. Spouse notes this has been more prominent in the last few days since his episode of elevated blood pressure. He has been taking 1 mg of lorazepam at bedtime  for long time, I wonder if this medication is impacting his cognitive status. Have asked him to try reducing dose (.5mg ) - he is agreeable.  Osteoporosis with fracture Last DEXA scan greater than 2 years ago, will repeat.  The patient asks about use of Forteo(family member wonders why he is not taking his medication).  Will defer this discussion until DEXA scan results are reviewed.  Since last visit, patient has completed a bone density scan. Results showed further progression and bone loss. See below for details The patient has also reduced his dose of lorazepam from 1 mg at bedtime 2.5 mg. He does not report any change in his ability to sleep.   Allergies  Allergen Reactions  . Warfarin Sodium Other (See Comments)    REACTION: Stomach bleeding  . Amlodipine Besylate Swelling      Medication List       This list is accurate as of: 11/03/13  2:41 PM.  Always use your most recent med list.               CALCIUM 600 + D PO  Take 1 tablet by mouth 2 (two) times daily.     carvedilol 25 MG tablet  Commonly known as:  COREG  One twice daily to strengthen the heart     cholecalciferol 1000 UNITS tablet  Commonly known as:  VITAMIN D  Take 1,000 Units by mouth daily.     dipyridamole-aspirin 200-25 MG per 12 hr capsule  Commonly known as:  AGGRENOX  Take one tablet twice daily to reduce stroke     furosemide 20 MG tablet  Commonly known as:  LASIX  Take 1 tablet (20 mg total) by mouth daily.     lisinopril 20 MG tablet  Commonly known as:  PRINIVIL,ZESTRIL  One daily to help control BP     LORazepam 1 MG tablet  Commonly known as:  ATIVAN  Take 1 tablet (1 mg total) by mouth at bedtime as needed (for sleep).     MYRBETRIQ 50 MG Tb24 tablet  Generic drug:  mirabegron ER  Take 50 mg by mouth daily.     potassium chloride SA 20 MEQ tablet  Commonly known as:  K-DUR,KLOR-CON  Take 1 tablet every other day for potassium supplement     senna-docusate 8.6-50 MG per tablet   Commonly known as:  Senokot-S  Take 2 tablets by mouth daily.     simvastatin 20 MG tablet  Commonly known as:  ZOCOR  Take 20 mg by mouth at bedtime.         DATA REVIEWED  Radiologic Exams 2008 DEXA for bone mineral density (osteoporosis analysis clinic [place], [place]) Lumbar spine T score -1.5, hip T score  -2.0  12/07/2008 DEXA for bone mineral density (osteoporosis analysis clinic [place], [place]) Lumbar spine T score -1.7, hip T score -2.1  09/29/2013 DUAL X-RAY ABSORPTIOMETRY (DXA) FOR BONE MINERAL DENSITY   FINDINGS: AP LUMBAR SPINE (L2-L4)  Bone Mineral Density (BMD):  0.850 g/cm2 , Young Adult T-Score:  -2.4 , Z-Score:  -1.1   LEFT FEMUR (NECK)  Bone Mineral Density (BMD):  0.624 g/cm2, Young Adult T-Score: -2.3,  Z-Score:  -0.6   Cardiovascular Exams:   Laboratory Studies: Lab Results  Component Value Date   WBC 7.9 05/21/2013   HGB 14.8 05/21/2013   HCT 42.4 05/21/2013   MCV 92.4 05/21/2013   PLT 192 05/21/2013   Lab Results  Component Value Date   NA 139 10/05/2013   K 4.0 10/05/2013   GLU 90 10/05/2013   BUN 24* 10/05/2013   CREATININE 1.1 10/05/2013   Lab Results  Component Value Date   CALCIUM 9.9 05/21/2013   ALBUMIN 3.9 01/13/2010   AST 20 10/05/2013   ALT 18 10/05/2013   ALKPHOS 35 10/05/2013   BILITOT 1.2 01/13/2010   GFRNONAA 59* 05/21/2013   GFRAA 68* 05/21/2013   No results found for this basename: vitaminb12, vitamind     REVIEW OF SYSTEMS  DATA OBTAINED: from patient,  GENERAL: Feels well   No recent fever, fatigue, change in appetite or weight SKIN: No itch, rash or open wounds RESPIRATORY: No cough, wheezing, SOB CARDIAC: No chest pain, palpitations  No edema. GI: No abdominal pain  No nausea, vomiting,diarrhea or constipation  No heartburn or reflux  GU: No dysuria, frequency or urgency  No change in urine volume or character  MUSCULOSKELETAL: Neck pain related to cervical fracture and surgical repair  No joint pain, swelling or  stiffness  No back pain  No muscle ache, pain, weakness  Gait is steady w/cane  No recent falls.  NEUROLOGIC: No dizziness, fainting. Has frequent mild headache   .  PSYCHIATRIC: No feelings of anxiety, depression  Sleeps well.    PHYSICAL EXAM Filed Vitals:   11/03/13 1418  BP: 120/72  Pulse: 92  Weight: 143 lb (64.864 kg)   Body mass index is 21.11 kg/(m^2).  GENERAL APPEARANCE: No acute distress, appropriately groomed, normal body habitus. Alert, pleasant, conversant. SKIN: No diaphoresis, rash, unusual lesions, wounds HEAD: Normocephalic, atraumatic EYES: Conjunctiva/lids clear  EARS: decreased hearing, chronic NOSE: No deformity or discharge. NECK: Limited  ROM, forward position is most comfortable, raising to neutral is mildly uncomfortable LYMPHATICS: No head, neck or supraclavicular adenopathy RESPIRATORY: Breathing is even, unlabored. Lung sounds are clear and full.  CARDIOVASCULAR: Heart RRR. No murmur or extra heart sounds  EDEMA: No peripheral edema. MUSCULOSKELETAL: Moves all extremities slowly with full ROM, strength and tone. Back with mild cervical/thoracic kyphosis,. Gait is slow,steady w/ cane NEUROLOGIC: Oriented to time, place, person. Speech clear, no tremor.  PSYCHIATRIC: Mood and affect appropriate to situation   ASSESSMENT/PLAN  Osteoporosis with fracture Patient with history of low bone mass, thoracic vertebral fracture 2009, cervical vertebral fracture 2014, continued neck pain since cervical fracture. Over the last several years patient's height has decreased from 6.2 to approximately 5 foot 10. Patient took Fosamax 1992-2010, currently takes calcium and vitamin D, most recent Vitamin D level 47. Most recent DEXA scan shows further decrease in bone mass; Femoral T score dropped from -1.7 to -2.4, hip T score dropped from -2.1 to -2.3.  This patient may benefit from use of Forteo for bone building properties, and reduction of cervical pain. Will investigate  preauthorization prior to prescribing  Cognitive change Patient's cognitive status is much improved from last visit. He notes he has reduced lorazepam from 1 mg at bedtime to .5 mg. He is willing to try and stop this medication.  Generalized anxiety disorder Patient has reduced lorazepam 2.5 mg at bedtime. No increase in his general anxiety, he is willing to try and stop this medication   Time: 40 minutes, >50% spent counseling/or care coordination  Family/ staff Communication:    Labs/tests ordered:    Follow up: Return in about 4 months (around 03/14/2014) for OVw/ Dr.Green.  Mardene Celeste, NP-C [place] [phone]  11/03/2013

## 2013-11-05 NOTE — Assessment & Plan Note (Signed)
Patient's cognitive status is much improved from last visit. He notes he has reduced lorazepam from 1 mg at bedtime to .5 mg. He is willing to try and stop this medication.

## 2013-11-05 NOTE — Assessment & Plan Note (Addendum)
Patient with history of low bone mass, thoracic vertebral fracture 2009, cervical vertebral fracture 2014, continued neck pain since cervical fracture. Over the last several years patient's height has decreased from 6.2 to approximately 5 foot 10. Patient took Fosamax 1992-2010, currently takes calcium and vitamin D, most recent Vitamin D level 47. Most recent DEXA scan shows further decrease in bone mass; Femoral T score dropped from -1.7 to -2.4, hip T score dropped from -2.1 to -2.3.  This patient may benefit from use of Forteo for bone building properties, and reduction of cervical pain. Will investigate preauthorization prior to prescribing

## 2013-11-05 NOTE — Assessment & Plan Note (Signed)
Patient has reduced lorazepam 2.5 mg at bedtime. No increase in his general anxiety, he is willing to try and stop this medication

## 2013-12-13 ENCOUNTER — Other Ambulatory Visit: Payer: Self-pay | Admitting: *Deleted

## 2013-12-13 DIAGNOSIS — I2589 Other forms of chronic ischemic heart disease: Secondary | ICD-10-CM

## 2013-12-13 MED ORDER — SIMVASTATIN 20 MG PO TABS: ORAL_TABLET | ORAL | Status: DC

## 2013-12-13 MED ORDER — CARVEDILOL 25 MG PO TABS: ORAL_TABLET | ORAL | Status: DC

## 2013-12-13 NOTE — Telephone Encounter (Signed)
Patient Requested mail order Rx's

## 2013-12-21 ENCOUNTER — Telehealth: Payer: Self-pay

## 2013-12-21 NOTE — Telephone Encounter (Signed)
Jacob Rosales completed authorization forms for Forteo, faxed

## 2013-12-27 ENCOUNTER — Other Ambulatory Visit: Payer: Self-pay | Admitting: Geriatric Medicine

## 2013-12-27 MED ORDER — TERIPARATIDE (RECOMBINANT) 600 MCG/2.4ML ~~LOC~~ SOLN: 20.0000 ug | Freq: Every day | SUBCUTANEOUS | Status: DC

## 2014-01-03 ENCOUNTER — Encounter: Payer: Self-pay | Admitting: Internal Medicine

## 2014-01-03 ENCOUNTER — Ambulatory Visit (INDEPENDENT_AMBULATORY_CARE_PROVIDER_SITE_OTHER): Payer: Medicare Other | Admitting: *Deleted

## 2014-01-03 DIAGNOSIS — I428 Other cardiomyopathies: Secondary | ICD-10-CM

## 2014-01-03 DIAGNOSIS — I509 Heart failure, unspecified: Secondary | ICD-10-CM

## 2014-01-08 LAB — MDC_IDC_ENUM_SESS_TYPE_REMOTE
Battery Remaining Percentage: 44 %
Battery Voltage: 2.87 V
Brady Statistic AP VP Percent: 91 %
Brady Statistic AP VS Percent: 1 %
Brady Statistic AS VS Percent: 1 %
Brady Statistic RA Percent Paced: 91 %
Date Time Interrogation Session: 20150504064738
HIGH POWER IMPEDANCE MEASURED VALUE: 43 Ohm
Lead Channel Impedance Value: 360 Ohm
Lead Channel Impedance Value: 600 Ohm
Lead Channel Pacing Threshold Amplitude: 1 V
Lead Channel Pacing Threshold Amplitude: 1.25 V
Lead Channel Pacing Threshold Pulse Width: 0.5 ms
Lead Channel Pacing Threshold Pulse Width: 0.8 ms
Lead Channel Setting Pacing Amplitude: 2 V
Lead Channel Setting Pacing Amplitude: 2.5 V
Lead Channel Setting Pacing Amplitude: 3 V
Lead Channel Setting Pacing Pulse Width: 1 ms
Lead Channel Setting Sensing Sensitivity: 2 mV
MDC IDC MSMT BATTERY REMAINING LONGEVITY: 24 mo
MDC IDC MSMT LEADCHNL LV PACING THRESHOLD PULSEWIDTH: 1 ms
MDC IDC MSMT LEADCHNL RA IMPEDANCE VALUE: 350 Ohm
MDC IDC MSMT LEADCHNL RA PACING THRESHOLD AMPLITUDE: 0.75 V
MDC IDC MSMT LEADCHNL RA SENSING INTR AMPL: 2.6 mV
MDC IDC MSMT LEADCHNL RV SENSING INTR AMPL: 12 mV
MDC IDC PG SERIAL: 631860
MDC IDC SET LEADCHNL RV PACING PULSEWIDTH: 0.5 ms
MDC IDC SET ZONE DETECTION INTERVAL: 300 ms
MDC IDC STAT BRADY AS VP PERCENT: 8.8 %
Zone Setting Detection Interval: 335 ms

## 2014-01-13 ENCOUNTER — Encounter: Payer: Self-pay | Admitting: Cardiology

## 2014-01-14 NOTE — Progress Notes (Signed)
Remote ICD transmission.   

## 2014-02-07 ENCOUNTER — Other Ambulatory Visit: Payer: Self-pay | Admitting: *Deleted

## 2014-02-07 DIAGNOSIS — I10 Essential (primary) hypertension: Secondary | ICD-10-CM

## 2014-02-07 MED ORDER — LISINOPRIL 20 MG PO TABS: ORAL_TABLET | ORAL | Status: DC

## 2014-02-07 NOTE — Telephone Encounter (Signed)
[facility]

## 2014-02-09 ENCOUNTER — Other Ambulatory Visit: Payer: Self-pay

## 2014-02-09 MED ORDER — LORAZEPAM 1 MG PO TABS: 1.0000 mg | ORAL_TABLET | Freq: Every evening | ORAL | Status: DC | PRN

## 2014-02-09 MED ORDER — ASPIRIN-DIPYRIDAMOLE ER 25-200 MG PO CP12: ORAL_CAPSULE | ORAL | Status: DC

## 2014-03-14 ENCOUNTER — Non-Acute Institutional Stay: Payer: Medicare Other | Admitting: Internal Medicine

## 2014-03-14 ENCOUNTER — Encounter: Payer: Self-pay | Admitting: Internal Medicine

## 2014-03-14 VITALS — BP 120/64 | HR 72 | Wt 146.0 lb

## 2014-03-14 DIAGNOSIS — R32 Unspecified urinary incontinence: Secondary | ICD-10-CM

## 2014-03-14 DIAGNOSIS — IMO0002 Reserved for concepts with insufficient information to code with codable children: Secondary | ICD-10-CM

## 2014-03-14 DIAGNOSIS — M8448XD Pathological fracture, other site, subsequent encounter for fracture with routine healing: Secondary | ICD-10-CM

## 2014-03-14 DIAGNOSIS — M545 Low back pain, unspecified: Secondary | ICD-10-CM

## 2014-03-14 DIAGNOSIS — M8080XD Other osteoporosis with current pathological fracture, unspecified site, subsequent encounter for fracture with routine healing: Secondary | ICD-10-CM

## 2014-03-14 DIAGNOSIS — I2589 Other forms of chronic ischemic heart disease: Secondary | ICD-10-CM

## 2014-03-14 DIAGNOSIS — R21 Rash and other nonspecific skin eruption: Secondary | ICD-10-CM

## 2014-03-14 DIAGNOSIS — S12100D Unspecified displaced fracture of second cervical vertebra, subsequent encounter for fracture with routine healing: Secondary | ICD-10-CM

## 2014-03-14 DIAGNOSIS — I5022 Chronic systolic (congestive) heart failure: Secondary | ICD-10-CM

## 2014-03-14 DIAGNOSIS — I10 Essential (primary) hypertension: Secondary | ICD-10-CM

## 2014-03-14 DIAGNOSIS — I482 Chronic atrial fibrillation, unspecified: Secondary | ICD-10-CM

## 2014-03-14 DIAGNOSIS — I4891 Unspecified atrial fibrillation: Secondary | ICD-10-CM

## 2014-03-14 MED ORDER — TRIAMCINOLONE ACETONIDE 0.1 % EX CREA: TOPICAL_CREAM | CUTANEOUS | Status: DC

## 2014-03-14 NOTE — Progress Notes (Signed)
Patient ID: Jacob Rosales, male   DOB: [birth year], 80 y.o.   MRN: [id number]    Location:  [facility]: Clinic (12)    Allergies  Allergen Reactions   Warfarin Sodium Other (See Comments)    REACTION: Stomach bleeding   Amlodipine Besylate Swelling    Chief Complaint  Patient presents with   Medical Management of Chronic Issues    A-Fib, CHF, blood pressure    HPI:  Chronic atrial fibrillation: rate controlled  Hyertension: controlled  Chronic systolic heart failure: compensated. He would like to try to be off the diuretic.   CARDIOMYOPATHY, ISCHEMIC: Unable to find 2 D Echo. Diagnosis entered by his cardiologist. No eveidence of fluid retention currently.  Osteoporosis with fracture, with routine healing, subsequent encounter; tolerating Forteo  Unspecified urinary incontinence: on Myrbetriq. May improve further if he can get off the furosemide  Midline low back pain without sciatica: mild and chronic  Fracture of second cervical vertebra, with routine healing, subsequent encounter: healed. Mild restrictions in neck movements.  Rash and nonspecific skin eruption: sternal area. Mild pruritus    Medications: Patient's Medications  New Prescriptions   No medications on file  Previous Medications   CALCIUM CARBONATE-VITAMIN D (CALCIUM 600 + D PO)    Take 1 tablet by mouth 2 (two) times daily.   CARVEDILOL (COREG) 25 MG TABLET    One twice daily to strengthen the heart   CHOLECALCIFEROL (VITAMIN D) 1000 UNITS TABLET    Take 1,000 Units by mouth daily.     DIPYRIDAMOLE-ASPIRIN (AGGRENOX) 200-25 MG PER 12 HR CAPSULE    Take one tablet twice daily to reduce stroke   FUROSEMIDE (LASIX) 20 MG TABLET    Take 1 tablet (20 mg total) by mouth daily.   LISINOPRIL (PRINIVIL,ZESTRIL) 20 MG TABLET    Take one tablet by mouth once daily to control blood pressure   LORAZEPAM (ATIVAN) 1 MG TABLET    Take 1 tablet (1 mg total) by mouth at  bedtime as needed (for sleep).   MIRABEGRON ER (MYRBETRIQ) 50 MG TB24 TABLET    Take 50 mg by mouth daily.   POTASSIUM CHLORIDE SA (K-DUR,KLOR-CON) 20 MEQ TABLET    Take 1 tablet every other day for potassium supplement   SENNA-DOCUSATE (SENOKOT-S) 8.6-50 MG PER TABLET    Take 2 tablets by mouth daily.   SIMVASTATIN (ZOCOR) 20 MG TABLET    Take one tablet by mouth at bedtime for cholesterol   TERIPARATIDE, RECOMBINANT, 600 MCG/2.4ML SOLN    Inject 0.08 mLs (20 mcg total) into the skin daily.  Modified Medications   No medications on file  Discontinued Medications   No medications on file     Review of Systems  Constitutional: Negative for fever, activity change, appetite change, fatigue and unexpected weight change.  HENT: Positive for rhinorrhea. Negative for ear pain, facial swelling and sinus pressure.   Eyes: Negative for photophobia, pain, discharge, redness and itching.  Cardiovascular: Negative for chest pain, palpitations and leg swelling.       PAF.AICD.  Gastrointestinal: Negative for nausea, abdominal pain, abdominal distention and rectal pain.       HX. Fecal incontinence. Brown staining when he wipes rectum. Hasa postprandial fecal urgency. Loss of appetite since fx of C2.  Endocrine: Negative.   Genitourinary:       Hx urinary incontinence. Leakage at night.  Musculoskeletal: Positive for back pain and myalgias. Negative for neck pain.  S/P fx C2 and surgery for stabilization. Low back discomfort.  Skin: Positive for rash.  Neurological:       Right foot drop  Hematological: Negative.   Psychiatric/Behavioral: Negative.     Filed Vitals:   03/14/14 1442  BP: 120/64  Pulse: 72  Weight: 146 lb (66.225 kg)   Body mass index is 21.55 kg/(m^2).  Physical Exam  Constitutional:  Frail elderly male.  HENT:  Right Ear: External ear normal.  Left Ear: External ear normal.  Nose: Nose normal.  Eyes: Conjunctivae and EOM are normal. Left eye exhibits no  discharge.  Neck: Normal range of motion. Neck supple. No JVD present. No tracheal deviation present. No thyromegaly present.  Wearing cervical collar.  Cardiovascular: Normal rate and regular rhythm.  Exam reveals no gallop and no friction rub.   No murmur heard. Pulmonary/Chest: Effort normal. No respiratory distress. He has no wheezes. He has no rales.  Abdominal: Soft. Bowel sounds are normal. He exhibits no distension and no mass. There is no tenderness.  Musculoskeletal: Normal range of motion. He exhibits no edema and no tenderness.  Right AFO. Wearing the soft neck collar.  Lymphadenopathy:    He has no cervical adenopathy.  Neurological: He is alert. He has normal reflexes. No cranial nerve deficit.  Right foot drop.  Skin: Skin is warm and dry. Rash (seborrhea at the sternal area) noted.  Psychiatric: He has a normal mood and affect. His behavior is normal. Judgment and thought content normal.     Labs reviewed: Clinical Support on 01/03/2014  Component Date Value Ref Range Status   Date Time Interrogation Session 01/08/2014 [id number]   Final   Pulse Generator Manufacturer 01/08/2014 [employer]   Final   Pulse Gen Model 01/08/2014 3231-40 Unify   Final   Pulse Gen Serial Number 01/08/2014 [id number]   Final   RV Sense Sensitivity 01/08/2014 2   Final   RV Adaptation Mode 01/08/2014 Adaptive Sensing   Final   LV Pace PulseWidth 01/08/2014 1   Final   LV Pace Amplitude 01/08/2014 3   Final   LV Pace Capture Mode 01/08/2014 Fixed Pacing   Final   RA Pace Amplitude 01/08/2014 2   Final   RV Pace PulseWidth 01/08/2014 0.5   Final   RV Pace Amplitude 01/08/2014 2.5   Final   Zone Setting Type Category 01/08/2014 VF   Final   Zone Detect Interval 01/08/2014 300   Final   Zone Setting Type Category 01/08/2014 VENTRICULAR_TACHYCARDIA_1   Final   Zone Detect Interval 01/08/2014 335   Final   Zone Setting Type Category 01/08/2014 VENTRICULAR_TACHYCARDIA_2    Final   Lead Channel Status 01/08/2014    Final   LV Impedance 01/08/2014 600   Final   LV Pacing Amplitude 01/08/2014 1.25   Final   LV PACING PULSEWIDTH 01/08/2014 1   Final   Lead Channel Status 01/08/2014    Final   RA Impedance 01/08/2014 350   Final   RA Amplitude 01/08/2014 2.6   Final   RA Pacing Amplitude 01/08/2014 0.75   Final   RA Pacing PulseWidth 01/08/2014 0.8   Final   Lead Channel Status 01/08/2014    Final   RV IMPEDANCE 01/08/2014 360   Final   RV Amplitude 01/08/2014 12   Final   RV Pacing Amplitude 01/08/2014 1   Final   RV Pacing PulseWidth 01/08/2014 0.5   Final   HighPow Impedance 01/08/2014 43  Final   HighPow Imped Status 01/08/2014    Final   Battery Status 01/08/2014 MOS   Final   Battery Longevity 01/08/2014 24   Final   Battery Percent 01/08/2014 44   Final   Battery Voltage 01/08/2014 2.87   Final   Brady RA Perc Paced 01/08/2014 91   Final   Brady AP VP Percent 01/08/2014 91   Final   Brady AS VP Percent 01/08/2014 8.8   Final   Brady AP VS Percent 01/08/2014 1   Final   Brady AS VS Percent 01/08/2014 1   Final   Eval Rhythm 01/08/2014 Ap/BiVp   Final   Miscellaneous Comment 01/08/2014    Final                   Value:Remote CRT-D device check. Sensing consistent with previous device measurements. Lead impedance trends stable over time. 3 mode switches---longest 8 sec, <1%. No ventricular arrhythmia episodes recorded. Patient bi-ventricularly pacing >99% of the time.                          Device programmed with appropriate safety margins. Heart failure diagnostics reviewed and trends are stable for patient.  Estimated longevity 1.8-2.58yrs. Merlin 04/06/14 & ROV w/ GT 1/16.      Assessment/Plan  Chronic atrial fibrillation: rate controlled  Hyertension: controlled  Chronic systolic heart failure:  -try off furosemide and KCl  CARDIOMYOPATHY, ISCHEMIC compensated  Osteoporosis with fracture, with  routine healing, subsequent encounter: continue Forteo  Unspecified urinary incontinence: continue Myrbvetriq  Midline low back pain without sciatica:improved  Fracture of second cervical vertebra, with routine healing, subsequent encounter:improved  Rash and nonspecific skin eruption  - Plan: triamcinolone cream (KENALOG) 0.1 %

## 2014-03-15 ENCOUNTER — Telehealth: Payer: Self-pay

## 2014-03-15 ENCOUNTER — Other Ambulatory Visit: Payer: Self-pay

## 2014-03-15 DIAGNOSIS — R21 Rash and other nonspecific skin eruption: Secondary | ICD-10-CM

## 2014-03-15 MED ORDER — TRIAMCINOLONE ACETONIDE 0.1 % EX CREA: TOPICAL_CREAM | CUTANEOUS | Status: DC

## 2014-03-15 NOTE — Telephone Encounter (Signed)
Patient was seen yesterday, Dr. Nyoka Cowden told him to use Kenalog 0.1%, has Kenalog 0.025%, wants to know what to do? Asked Dr. Nyoka Cowden, wants him to use the 1%, faxed Rx to [facility], called patient back to told him. He will go pick it up.

## 2014-03-23 ENCOUNTER — Telehealth: Payer: Self-pay | Admitting: *Deleted

## 2014-03-23 NOTE — Telephone Encounter (Signed)
Patient called and stated that he saw you 10 days ago. Stated that in between time he has developed a bruise about 2" long and 2 1/2" wide on right hand. He does not remember hitting it on anything. He is afraid one of his medications is causing this. Please Advise.

## 2014-03-24 NOTE — Telephone Encounter (Signed)
The Aggrenox (dipyridamole/ ASA) may be associated with bruising, but he needs to stay on this drug.

## 2014-03-24 NOTE — Telephone Encounter (Signed)
Patient Notified

## 2014-04-06 ENCOUNTER — Encounter: Payer: Self-pay | Admitting: Nurse Practitioner

## 2014-04-06 ENCOUNTER — Ambulatory Visit (INDEPENDENT_AMBULATORY_CARE_PROVIDER_SITE_OTHER): Payer: Medicare Other | Admitting: *Deleted

## 2014-04-06 ENCOUNTER — Non-Acute Institutional Stay: Payer: Medicare Other | Admitting: Nurse Practitioner

## 2014-04-06 VITALS — BP 126/70 | HR 76 | Temp 97.5°F | Wt 147.0 lb

## 2014-04-06 DIAGNOSIS — I5022 Chronic systolic (congestive) heart failure: Secondary | ICD-10-CM

## 2014-04-06 DIAGNOSIS — K1370 Unspecified lesions of oral mucosa: Secondary | ICD-10-CM

## 2014-04-06 DIAGNOSIS — I25810 Atherosclerosis of coronary artery bypass graft(s) without angina pectoris: Secondary | ICD-10-CM

## 2014-04-06 DIAGNOSIS — I428 Other cardiomyopathies: Secondary | ICD-10-CM

## 2014-04-06 DIAGNOSIS — I2589 Other forms of chronic ischemic heart disease: Secondary | ICD-10-CM

## 2014-04-06 DIAGNOSIS — K1379 Other lesions of oral mucosa: Secondary | ICD-10-CM

## 2014-04-06 NOTE — Progress Notes (Signed)
Remote ICD transmission.   

## 2014-04-06 NOTE — Progress Notes (Signed)
Patient ID: Jacob Rosales, male   DOB: May 01, 1928, 80 y.o.   MRN: [id number]   [facility] [id number]  Code Status: DNR, MOST form     Contact Information   Name Relation Home Work [place] Spouse [phone]     [place] Daughter [phone]        Chief Complaint  Patient presents with  . blood mouth    got up several times last night had blood in mouth. Better now. No dental work.     HPI: This is a 80 y.o. male resident of [place], [place]  Section seen today in the clinic due to spitting up blood. Reports he got up last night to go to the bathroom when he went to spit he noticed partials of blood in the phlegm. No recent dental work. No nose bleeds, No illness, no cough but has had chronic sinuses drainage. No sore throat no cough or congestion. No recurrence today. Attempted several times to spit up phlegm to recreate blood but it has been clear.      Allergies  Allergen Reactions  . Warfarin Sodium Other (See Comments)    REACTION: Stomach bleeding  . Amlodipine Besylate Swelling      Medication List       This list is accurate as of: 04/06/14  3:22 PM.  Always use your most recent med list.               CALCIUM 600 + D PO  Take 1 tablet by mouth 2 (two) times daily.     carvedilol 25 MG tablet  Commonly known as:  COREG  One twice daily to strengthen the heart     cholecalciferol 1000 UNITS tablet  Commonly known as:  VITAMIN D  Take 1,000 Units by mouth daily.     dipyridamole-aspirin 200-25 MG per 12 hr capsule  Commonly known as:  AGGRENOX  Take one tablet twice daily to reduce stroke     lisinopril 20 MG tablet  Commonly known as:  PRINIVIL,ZESTRIL  Take one tablet by mouth once daily to control blood pressure     LORazepam 1 MG tablet  Commonly known as:  ATIVAN  Take 1 tablet (1 mg total) by mouth at bedtime as needed (for sleep).     MYRBETRIQ 50 MG Tb24 tablet  Generic  drug:  mirabegron ER  Take 50 mg by mouth daily.     senna-docusate 8.6-50 MG per tablet  Commonly known as:  Senokot-S  Take 2 tablets by mouth daily.     simvastatin 20 MG tablet  Commonly known as:  ZOCOR  Take one tablet by mouth at bedtime for cholesterol     Teriparatide (Recombinant) 600 MCG/2.4ML Soln  Inject 0.08 mLs (20 mcg total) into the skin daily.     triamcinolone cream 0.1 %  Commonly known as:  KENALOG  Apply sparingly up to twice daily to rash until it is resolved         DATA REVIEWED  Radiologic Exams 2008 DEXA for bone mineral density (osteoporosis analysis clinic [place], [place]) Lumbar spine T score -1.5, hip T score  -2.0  12/07/2008 DEXA for bone mineral density (osteoporosis analysis clinic [place], [place]) Lumbar spine T score -1.7, hip T score -2.1  09/29/2013 DUAL X-RAY ABSORPTIOMETRY (DXA) FOR BONE MINERAL DENSITY   FINDINGS: AP LUMBAR SPINE (L2-L4)  Bone Mineral Density (BMD):  0.850 g/cm2 , Young Adult T-Score:  -2.4 ,  Z-Score:  -1.1   LEFT FEMUR (NECK)  Bone Mineral Density (BMD):  0.624 g/cm2, Young Adult T-Score: -2.3,  Z-Score:  -0.6   Cardiovascular Exams:   Laboratory Studies: Lab Results  Component Value Date   WBC 7.9 05/21/2013   HGB 14.8 05/21/2013   HCT 42.4 05/21/2013   MCV 92.4 05/21/2013   PLT 192 05/21/2013   Lab Results  Component Value Date   NA 139 10/05/2013   K 4.0 10/05/2013   GLU 90 10/05/2013   BUN 24* 10/05/2013   CREATININE 1.1 10/05/2013   Lab Results  Component Value Date   CALCIUM 9.9 05/21/2013   ALBUMIN 3.9 01/13/2010   AST 20 10/05/2013   ALT 18 10/05/2013   ALKPHOS 35 10/05/2013   BILITOT 1.2 01/13/2010   GFRNONAA 59* 05/21/2013   GFRAA 68* 05/21/2013   No results found for this basename: vitaminb12,  vitamind     REVIEW OF SYSTEMS  Review of Systems  Constitutional: Negative for fever and chills.  HENT: Negative for congestion, ear discharge, ear pain, nosebleeds and sore throat.     Respiratory: Negative for cough, sputum production, shortness of breath and stridor.   Cardiovascular: Negative for chest pain and leg swelling.  Gastrointestinal: Negative for diarrhea, constipation and blood in stool.  Genitourinary: Negative for dysuria.  Musculoskeletal: Negative for myalgias.  Skin: Negative for itching and rash.  Neurological: Negative for dizziness and headaches.    PHYSICAL EXAM Filed Vitals:   04/06/14 1506  BP: 126/70  Pulse: 76  Temp: 97.5 F (36.4 C)  TempSrc: Oral  Weight: 147 lb (66.679 kg)   Body mass index is 21.7 kg/(m^2).  GENERAL APPEARANCE: No acute distress, appropriately groomed, normal body habitus. Alert, pleasant, conversant. SKIN: No diaphoresis, rash, unusual lesions, wounds HEAD: Normocephalic, atraumatic EYES: Conjunctiva/lids clear  EARS: decreased hearing, chronic NOSE: No deformity or discharge. MOUTH/THROAT:  Lips w/o lesions. Mouth and throat normal. Tongue moist, w/o lesion. frenulum linguae with some irritation at base but no active bleeding NECK: Limited ROM, forward position is most comfortable, raising to neutral is mildly uncomfortable LYMPHATICS: No head, neck or supraclavicular adenopathy RESPIRATORY: Breathing is even, unlabored. Lung sounds are clear and full.  CARDIOVASCULAR: Heart RRR. No murmur or extra heart sounds  EDEMA: No peripheral edema. MUSCULOSKELETAL: Moves all extremities slowly with full ROM, strength and tone. Back with mild cervical/thoracic kyphosis,. Gait is slow,steady w/ cane NEUROLOGIC: Oriented to time, place, person. Speech clear, no tremor.  PSYCHIATRIC: Mood and affect appropriate to situation   ASSESSMENT/PLAN  coronary artery disease, status post myocardial infarction, status post bypass surgery, status post ICD implantation- conts on plavix, with some bleeding in mouth but no def source will cont to monitor but will not stop plavix at this time  Bleeding in mouth- monitor, clinic nurse  aware

## 2014-04-11 LAB — MDC_IDC_ENUM_SESS_TYPE_REMOTE
Battery Remaining Longevity: 22 mo
Battery Voltage: 2.87 V
Brady Statistic AP VP Percent: 90 %
Brady Statistic AP VS Percent: 1.1 %
Brady Statistic AS VP Percent: 8.5 %
Date Time Interrogation Session: 20150805071845
HighPow Impedance: 41 Ohm
Lead Channel Impedance Value: 340 Ohm
Lead Channel Impedance Value: 340 Ohm
Lead Channel Impedance Value: 600 Ohm
Lead Channel Pacing Threshold Amplitude: 1 V
Lead Channel Pacing Threshold Amplitude: 1.25 V
Lead Channel Pacing Threshold Pulse Width: 0.5 ms
Lead Channel Pacing Threshold Pulse Width: 1 ms
Lead Channel Sensing Intrinsic Amplitude: 2.4 mV
Lead Channel Setting Pacing Amplitude: 2 V
Lead Channel Setting Pacing Amplitude: 2.5 V
Lead Channel Setting Pacing Amplitude: 3 V
Lead Channel Setting Pacing Pulse Width: 0.5 ms
MDC IDC MSMT BATTERY REMAINING PERCENTAGE: 39 %
MDC IDC MSMT LEADCHNL RA PACING THRESHOLD AMPLITUDE: 0.75 V
MDC IDC MSMT LEADCHNL RA PACING THRESHOLD PULSEWIDTH: 0.8 ms
MDC IDC MSMT LEADCHNL RV SENSING INTR AMPL: 12 mV
MDC IDC PG SERIAL: 631860
MDC IDC SET LEADCHNL LV PACING PULSEWIDTH: 1 ms
MDC IDC SET LEADCHNL RV SENSING SENSITIVITY: 2 mV
MDC IDC SET ZONE DETECTION INTERVAL: 300 ms
MDC IDC SET ZONE DETECTION INTERVAL: 335 ms
MDC IDC STAT BRADY AS VS PERCENT: 1 %
MDC IDC STAT BRADY RA PERCENT PACED: 91 %

## 2014-04-20 ENCOUNTER — Encounter: Payer: Self-pay | Admitting: Cardiology

## 2014-04-26 ENCOUNTER — Telehealth: Payer: Self-pay | Admitting: Internal Medicine

## 2014-04-26 NOTE — Telephone Encounter (Signed)
New problem ° ° ° °Pt having SOB.. °

## 2014-04-26 NOTE — Telephone Encounter (Signed)
The pt states that he is very SOB with exertion and has been for 1 week or more. The pt denies edema, CP, dizziness and wheezing.  The pt has been given an appointment to see Jacob Merle, NP tomorrow at 2:30.  The pt is advised that if his symptoms worsen to call 911 or have someone drive him (not to drive himself) to the ER or Urgent care. He verbalized understanding and thanked me for my assistance.

## 2014-04-27 ENCOUNTER — Ambulatory Visit
Admission: RE | Admit: 2014-04-27 | Discharge: 2014-04-27 | Disposition: A | Discharge status: Home / Self Care | Payer: Medicare Other | Source: Ambulatory Visit | Attending: Nurse Practitioner | Admitting: Nurse Practitioner

## 2014-04-27 ENCOUNTER — Encounter: Payer: Self-pay | Admitting: Nurse Practitioner

## 2014-04-27 ENCOUNTER — Ambulatory Visit (INDEPENDENT_AMBULATORY_CARE_PROVIDER_SITE_OTHER): Payer: Medicare Other | Admitting: Nurse Practitioner

## 2014-04-27 ENCOUNTER — Other Ambulatory Visit: Payer: Self-pay | Admitting: Internal Medicine

## 2014-04-27 VITALS — BP 150/90 | HR 83 | Ht 70.0 in | Wt 146.1 lb

## 2014-04-27 DIAGNOSIS — R0989 Other specified symptoms and signs involving the circulatory and respiratory systems: Secondary | ICD-10-CM

## 2014-04-27 DIAGNOSIS — I482 Chronic atrial fibrillation, unspecified: Secondary | ICD-10-CM

## 2014-04-27 DIAGNOSIS — R0609 Other forms of dyspnea: Secondary | ICD-10-CM

## 2014-04-27 DIAGNOSIS — I4891 Unspecified atrial fibrillation: Secondary | ICD-10-CM

## 2014-04-27 DIAGNOSIS — I5022 Chronic systolic (congestive) heart failure: Secondary | ICD-10-CM

## 2014-04-27 DIAGNOSIS — I2589 Other forms of chronic ischemic heart disease: Secondary | ICD-10-CM

## 2014-04-27 DIAGNOSIS — R0600 Dyspnea, unspecified: Secondary | ICD-10-CM

## 2014-04-27 LAB — MDC_IDC_ENUM_SESS_TYPE_INCLINIC
Battery Remaining Longevity: 22.8 mo
Date Time Interrogation Session: 20150826155611
HIGH POWER IMPEDANCE MEASURED VALUE: 40 Ohm
Lead Channel Impedance Value: 337.5 Ohm
Lead Channel Impedance Value: 337.5 Ohm
Lead Channel Impedance Value: 587.5 Ohm
Lead Channel Pacing Threshold Amplitude: 0.75 V
Lead Channel Pacing Threshold Amplitude: 1 V
Lead Channel Pacing Threshold Pulse Width: 0.5 ms
Lead Channel Pacing Threshold Pulse Width: 0.8 ms
Lead Channel Pacing Threshold Pulse Width: 1 ms
Lead Channel Pacing Threshold Pulse Width: 1 ms
Lead Channel Sensing Intrinsic Amplitude: 12 mV
Lead Channel Setting Pacing Amplitude: 2.5 V
Lead Channel Setting Pacing Pulse Width: 0.5 ms
Lead Channel Setting Pacing Pulse Width: 1 ms
Lead Channel Setting Sensing Sensitivity: 2 mV
MDC IDC MSMT LEADCHNL LV PACING THRESHOLD AMPLITUDE: 1 V
MDC IDC MSMT LEADCHNL RA PACING THRESHOLD AMPLITUDE: 0.75 V
MDC IDC MSMT LEADCHNL RA PACING THRESHOLD PULSEWIDTH: 0.8 ms
MDC IDC MSMT LEADCHNL RA SENSING INTR AMPL: 1.3 mV
MDC IDC MSMT LEADCHNL RV PACING THRESHOLD AMPLITUDE: 1 V
MDC IDC MSMT LEADCHNL RV PACING THRESHOLD AMPLITUDE: 1 V
MDC IDC MSMT LEADCHNL RV PACING THRESHOLD PULSEWIDTH: 0.5 ms
MDC IDC PG SERIAL: 631860
MDC IDC SET LEADCHNL LV PACING AMPLITUDE: 2.5 V
MDC IDC SET LEADCHNL RA PACING AMPLITUDE: 2 V
MDC IDC STAT BRADY RA PERCENT PACED: 91 %
MDC IDC STAT BRADY RV PERCENT PACED: 98 %
Zone Setting Detection Interval: 300 ms
Zone Setting Detection Interval: 335 ms

## 2014-04-27 LAB — BASIC METABOLIC PANEL
BUN: 23 mg/dL (ref 6–23)
CO2: 26 mEq/L (ref 19–32)
Calcium: 9.6 mg/dL (ref 8.4–10.5)
Chloride: 104 mEq/L (ref 96–112)
Creatinine, Ser: 1.3 mg/dL (ref 0.4–1.5)
GFR: 54.92 mL/min — ABNORMAL LOW (ref >60.00)
Glucose, Bld: 83 mg/dL (ref 70–99)
Potassium: 4.1 mEq/L (ref 3.5–5.1)
Sodium: 137 mEq/L (ref 135–145)

## 2014-04-27 LAB — TSH: TSH: 1.81 u[IU]/mL (ref 0.35–4.50)

## 2014-04-27 LAB — CBC
HCT: 39.5 % (ref 39.0–52.0)
Hemoglobin: 13.1 g/dL (ref 13.0–17.0)
MCHC: 33 g/dL (ref 30.0–36.0)
MCV: 95.9 fl (ref 78.0–100.0)
Platelets: 162 10*3/uL (ref 150.0–400.0)
RBC: 4.12 Mil/uL — ABNORMAL LOW (ref 4.22–5.81)
RDW: 14.9 % (ref 11.5–15.5)
WBC: 5.1 10*3/uL (ref 4.0–10.5)

## 2014-04-27 LAB — HEPATIC FUNCTION PANEL
ALT: 17 U/L (ref 0–53)
AST: 25 U/L (ref 0–37)
Albumin: 3.9 g/dL (ref 3.5–5.2)
Alkaline Phosphatase: 38 U/L — ABNORMAL LOW (ref 39–117)
Bilirubin, Direct: 0.1 mg/dL (ref 0.0–0.3)
Total Bilirubin: 1.1 mg/dL (ref 0.2–1.2)
Total Protein: 7.1 g/dL (ref 6.0–8.3)

## 2014-04-27 LAB — BRAIN NATRIURETIC PEPTIDE: Pro B Natriuretic peptide (BNP): 1024 pg/mL — ABNORMAL HIGH (ref 0.0–100.0)

## 2014-04-27 MED ORDER — FUROSEMIDE 40 MG PO TABS: 40.0000 mg | ORAL_TABLET | Freq: Every day | ORAL | Status: DC

## 2014-04-27 NOTE — Progress Notes (Signed)
Jacob Rosales Date of Birth: 09/26/1927 Medical Record #[id number]  History of Present Illness: Jacob Rosales is seen back today for a work in visit. Seen for Dr. Lovena Le. He is an 80 year old male with known CAD, prior MI with past CABG, and has ICD in place. Other issues include past CHB, chronic systolic heart failure, ICM, prostate cancer with past radiation, vertebral compression fractures and past cervical spine fracture last year, HTN, HLD, TIA, and AF -  Noted to be chronic but ? PAF. He is allergic to coumadin. He is on Aggrenox.   Last seen here in January. Felt to be NYHA II. Mostly limited by mobility issues.   He called yesterday with DOE for one week. Thus added to my schedule.  Comes in today. Here with his wife. He is more short of breath. He has been off of his Lasix for about 2 months - stopped due to frequent urination. He is now more short of breath. His weight is stable. No swelling. Watches his salt. Short of breath with talking. No increased PND/orthopnea. No chest pain. Looks to be in chronic AF. No active bleeding. Took extra Ativan last night - little more groggy/sedated today.  Say his breathing is no worse but no better. No fever or chills. No dysuria noted. Wife thinks his mentation is stable.   Current Outpatient Prescriptions  Medication Sig Dispense Refill  . Calcium Carbonate-Vitamin D (CALCIUM 600 + D PO) Take 1 tablet by mouth 2 (two) times daily.      . carvedilol (COREG) 25 MG tablet One twice daily to strengthen the heart  180 tablet  3  . cholecalciferol (VITAMIN D) 1000 UNITS tablet Take 1,000 Units by mouth daily.        Marland Kitchen dipyridamole-aspirin (AGGRENOX) 200-25 MG per 12 hr capsule Take one tablet twice daily to reduce stroke  180 capsule  3  . lisinopril (PRINIVIL,ZESTRIL) 20 MG tablet Take one tablet by mouth once daily to control blood pressure  90 tablet  3  . LORazepam (ATIVAN) 1 MG tablet Take 1 tablet (1 mg total) by mouth at bedtime as needed (for  sleep).  30 tablet  5  . mirabegron ER (MYRBETRIQ) 50 MG TB24 tablet Take 50 mg by mouth daily.      Marland Kitchen senna-docusate (SENOKOT-S) 8.6-50 MG per tablet Take 2 tablets by mouth daily.      . simvastatin (ZOCOR) 20 MG tablet Take one tablet by mouth at bedtime for cholesterol  90 tablet  3  . Teriparatide, Recombinant, 600 MCG/2.4ML SOLN Inject 0.08 mLs (20 mcg total) into the skin daily.  2.4 mL  11  . triamcinolone cream (KENALOG) 0.1 % Apply sparingly up to twice daily to rash until it is resolved  30 g  4   No current facility-administered medications for this visit.    Allergies  Allergen Reactions  . Warfarin Sodium Other (See Comments)    REACTION: Stomach bleeding  . Amlodipine Besylate Swelling    Past Medical History  Diagnosis Date  . Complete AV block   . Chronic systolic heart failure   . Cardiomyopathy, ischemic   . Automatic implantable cardiac defibrillator in situ   . CAD (coronary artery disease) of artery bypass graft   . Prostate cancer     S/P radiation rx  . Osteoporosis     A/P Vertebral compression fx's  . Hypertension   . Dyslipidemia   . TIA (transient ischemic attack)  H/o  . Lumbago 10/05/2012  . Cervicalgia 04/06/2012  . 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 Unspecified vitamin D deficiency 10/07/2011  . Unspecified pruritic disorder 10/07/2011  . Rash and other nonspecific skin eruption 10/07/2011  . Basal cell carcinoma of skin of lower limb, including hip 04/08/2011  . Squamous cell carcinoma of skin of lower limb, including hip 04/08/2011  . Unspecified sinusitis (chronic) 10/08/2010  . Impotence of organic origin 10/08/2010  . Seborrhea 04/09/2010  . Insomnia, unspecified 04/09/2010  . Altered mental status 01/12/2010  . Full incontinence of feces 10/09/2009  . Atrial fibrillation 05/15/2006  . Unspecified urinary incontinence 05/15/2006  . Sebaceous cyst 10/09/2005  . Pathologic fracture of vertebrae 05/16/1991  . Fracture of C2 vertebra,  closed     s/p cervical fusion 05/2013  . Osteoporosis with fracture 04/05/2013    History of vertebal fracture.     Past Surgical History  Procedure Laterality Date  . Coronary artery bypass graft    . Hernia repair    . Kyphosis surgery    . Biv-icd  11/21/2010    [employer] ICD Model#[id number] [phone]  . Cardiac defibrillator placement    . Cyst excision  10/2012    back Dr. Wilhemina Bonito  . Cyst removal neck  12/2012    basel cell (right) Dr. Sarajane Jews  . Colonoscopy  2006    polyps benign  . Dexa  April 2010  . Posterior cervical fusion/foraminotomy N/A 05/21/2013    Procedure: Cervical One, Cervical Two, Cervical Three Posterior cervical fusion with lateral mass fixation;  Surgeon: Charlie Pitter, MD;  Location: [facility];  Service: Neurosurgery;  Laterality: N/A;  POSTERIOR CERVICAL FUSION/FORAMINOTOMY LEVEL 2    History  Smoking status  . Former Smoker  . Quit date: 01/13/1962  Smokeless tobacco  . Never Used    History  Alcohol Use  . 1.2 oz/week  . 1 Glasses of wine, 1 Cans of beer per week    Family History  Problem Relation Age of Onset  . Coronary artery disease Neg Hx   . Heart disease Mother     Review of Systems: The review of systems is per the HPI.  All other systems were reviewed and are negative.  Physical Exam: BP 150/90  Pulse 83  Ht 5\' 10"  (1.778 m)  Wt 146 lb 1.9 oz (66.28 kg)  BMI 20.97 kg/m2  SpO2 95% Patient is very pleasant but frail. Skin is warm and dry. Color is normal.  HEENT is unremarkable but has JVD. His weight is down. He has no swelling. Belly soft and flat. Normocephalic/atraumatic. PERRL. Sclera are nonicteric. Neck is supple. No masses. No JVD. Lungs are clear. Cardiac exam shows a fairly regular rate and rhythm. Abdomen is soft. Extremities are without edema. Gait and ROM are intact but using a cane. No gross neurologic deficits noted.  Wt Readings from Last 3 Encounters:  04/27/14 146 lb 1.9 oz (66.28 kg)  04/06/14 147  lb (66.679 kg)  03/14/14 146 lb (66.225 kg)    LABORATORY DATA/PROCEDURES:  Lab Results  Component Value Date   WBC 7.9 05/21/2013   HGB 14.8 05/21/2013   HCT 42.4 05/21/2013   PLT 192 05/21/2013   GLUCOSE 91 05/21/2013   CHOL 131 10/05/2013   TRIG 63 10/05/2013   HDL 50 10/05/2013   LDLCALC 67 10/05/2013   ALT 18 10/05/2013   AST 20 10/05/2013   NA 139 10/05/2013   K  4.0 10/05/2013   CL 95* 05/21/2013   CREATININE 1.1 10/05/2013   BUN 24* 10/05/2013   CO2 24 05/21/2013   TSH 2.292 Test methodology is 3rd generation TSH 01/13/2010   PSA 0.49 Test Methodology: [employer] PSA 01/13/2010   INR 1.0 11/16/2010   HGBA1C  Value: 5.7 (NOTE)                                                                       According to the ADA Clinical Practice Recommendations for 2011, when HbA1c is used as a screening test:   >=6.5%   Diagnostic of Diabetes Mellitus           (if abnormal result  is confirmed)  5.7-6.4%   Increased risk of developing Diabetes Mellitus  References:Diagnosis and Classification of Diabetes Mellitus,Diabetes TZGY,1749,44(HQPRF 1):S62-S69 and Standards of Medical Care in         Diabetes - 2011,Diabetes Care,2011,34  (Suppl 1):S11-S61.* 01/13/2010    BNP (last 3 results) No results found for this basename: PROBNP,  in the last 8760 hours  Echo Study Conclusions from May 2011  - Left ventricle: LVEF Is approximately 25% with akinesis of the inferior and posterior walls, porximal lateral wall; hypokinesis elsewhere. The cavity size was normal. Wall thickness was increased in a pattern of mild LVH. - Aortic valve: Mild regurgitation. - Mitral valve: Moderate regurgitation. - Right atrium: The atrium was mildly dilated.    Assessment / Plan: 1. ICM with chronic systolic HF - with worsening dyspnea. Last EF of 25% from 2011. His device is checked. He is AV paced. Not in AF. Cor [place]ue stable - was elevated last month and earlier this month but currently stable. Will check labs today, send for CXR,  update his echo and restart low dose Lasix. Further disposition to follow once we get some information back.   2. CAD - no active chest pain  3. Underlying ICD -  Will get his device checked  4. AF - probably PAF - on Aggrenox - has had prior bleed on Coumadin. He has underlying CHB on his device check today and is AV paced.   Further disposition to follow.   Patient is agreeable to this plan and will call if any problems develop in the interim.   Burtis Junes, RN, [facility] [address] [place], [place]  [phone]

## 2014-04-27 NOTE — Addendum Note (Signed)
Addended by: Burtis Junes on: 04/27/2014 03:38 PM   Modules accepted: Orders

## 2014-04-27 NOTE — Patient Instructions (Signed)
We will check labs today  We are sending your for a CXR  Please go to [facility] to [place] on the first floor for a chest Xray - you may walk in.   We are going to get an echocardiogram  I am restarting your Lasix at 40 mg daily x 3 days and then 1/2 pill daily  If your symptoms get any worse, go to the [facility] ER.  Call the [place] office at [phone] if you have any questions, problems or concerns.

## 2014-04-29 ENCOUNTER — Telehealth: Payer: Self-pay | Admitting: *Deleted

## 2014-04-29 ENCOUNTER — Other Ambulatory Visit: Payer: Self-pay | Admitting: *Deleted

## 2014-04-29 DIAGNOSIS — R9389 Abnormal findings on diagnostic imaging of other specified body structures: Secondary | ICD-10-CM

## 2014-04-29 MED ORDER — MOXIFLOXACIN HCL 400 MG PO TABS
400.0000 mg | ORAL_TABLET | Freq: Every day | ORAL | Status: DC
Start: 2014-04-29 — End: 2014-08-10

## 2014-04-29 NOTE — Telephone Encounter (Signed)
S/w pt is agreeable to plan will start avelox ( 400 mg ) daily for 5 days and will go to [facility] in 1 month for repeat cxr.  Pt will come me in 2 weeks to give an update of condition.

## 2014-04-29 NOTE — Telephone Encounter (Signed)
Pt called to verify dosage of antibiotic ( 400 mg ) daily for 5 days, and when to take medication,  stated on bottle 8 pm.  I stated that is when pt should take medication.

## 2014-04-29 NOTE — Telephone Encounter (Signed)
S/w pt is agreeable to plan will start avelox (

## 2014-05-02 ENCOUNTER — Ambulatory Visit (HOSPITAL_COMMUNITY): Payer: Medicare Other | Attending: Cardiovascular Disease

## 2014-05-02 DIAGNOSIS — R0600 Dyspnea, unspecified: Secondary | ICD-10-CM

## 2014-05-02 DIAGNOSIS — I10 Essential (primary) hypertension: Secondary | ICD-10-CM | POA: Diagnosis not present

## 2014-05-02 DIAGNOSIS — I5022 Chronic systolic (congestive) heart failure: Secondary | ICD-10-CM

## 2014-05-02 DIAGNOSIS — I509 Heart failure, unspecified: Secondary | ICD-10-CM | POA: Insufficient documentation

## 2014-05-02 DIAGNOSIS — F17200 Nicotine dependence, unspecified, uncomplicated: Secondary | ICD-10-CM | POA: Diagnosis not present

## 2014-05-02 DIAGNOSIS — I482 Chronic atrial fibrillation, unspecified: Secondary | ICD-10-CM

## 2014-05-02 DIAGNOSIS — E785 Hyperlipidemia, unspecified: Secondary | ICD-10-CM | POA: Insufficient documentation

## 2014-05-02 NOTE — Progress Notes (Signed)
2D Echo completed. 05/02/2014 

## 2014-05-05 ENCOUNTER — Telehealth: Payer: Self-pay | Admitting: Nurse Practitioner

## 2014-05-05 ENCOUNTER — Encounter: Payer: Self-pay | Admitting: Internal Medicine

## 2014-05-05 NOTE — Telephone Encounter (Signed)
Advised patient of lab results and what Brynda Rim PA read echo as. Patient would like Tera Helper NP to review echo. Patient stated he was feeling better and his issues now are neck related.   Notes Recorded by Earvin Hansen on 04/28/2014 at 2:55 PM Left message to call back  Notes Recorded by Burtis Junes, NP on 04/27/2014 at 9:19 PM Ok to report. Still waiting on CXR result. Fluid level test is elevated. Lasix was started back at his OV with me. Continue as prescribed.  Please ask him to call Friday or Monday to Dr. Tanna Furry nurse to see if he has had improvement in symptoms.   Notes Recorded by Liliane Shi, PA-C on 05/03/2014 at 10:08 PM EF stable at 25-30% - no change since 2011 Mild leakage of Aortic valve and mod leakage of Mitral valve Mod biatrial enlargement Continue with current treatment plan. Truitt Merle, NP will review further when she returns. Richardson Dopp, PA-C  05/03/2014 10:08 PM

## 2014-05-05 NOTE — Telephone Encounter (Signed)
New Message  Pt called back for ECHO and Labs results. Please call

## 2014-05-09 ENCOUNTER — Encounter: Payer: Self-pay | Admitting: Internal Medicine

## 2014-05-13 ENCOUNTER — Ambulatory Visit
Admission: RE | Admit: 2014-05-13 | Discharge: 2014-05-13 | Disposition: A | Discharge status: Home / Self Care | Payer: Medicare Other | Source: Ambulatory Visit | Attending: Nurse Practitioner | Admitting: Nurse Practitioner

## 2014-05-13 ENCOUNTER — Telehealth: Payer: Self-pay | Admitting: *Deleted

## 2014-05-13 DIAGNOSIS — R9389 Abnormal findings on diagnostic imaging of other specified body structures: Secondary | ICD-10-CM

## 2014-05-13 NOTE — Telephone Encounter (Signed)
Pt called in yesterday to make sure still having repeat CXR.  Researched chart is to follow up with repeat CXR in 4 weeks.  Pt is aware and will go to [facility] imaging.

## 2014-05-26 ENCOUNTER — Other Ambulatory Visit: Payer: Self-pay | Admitting: *Deleted

## 2014-05-26 DIAGNOSIS — I10 Essential (primary) hypertension: Secondary | ICD-10-CM

## 2014-05-26 MED ORDER — LISINOPRIL 20 MG PO TABS
ORAL_TABLET | ORAL | Status: DC
Start: 2014-05-26 — End: 2014-12-23

## 2014-05-26 NOTE — Telephone Encounter (Signed)
Patient requested a mail order Rx to be faxed to [facility].

## 2014-06-15 ENCOUNTER — Ambulatory Visit: Payer: Medicare Other | Admitting: Nurse Practitioner

## 2014-06-16 ENCOUNTER — Ambulatory Visit (INDEPENDENT_AMBULATORY_CARE_PROVIDER_SITE_OTHER): Payer: Medicare Other | Admitting: Nurse Practitioner

## 2014-06-16 ENCOUNTER — Encounter: Payer: Self-pay | Admitting: Nurse Practitioner

## 2014-06-16 VITALS — BP 144/82 | HR 73 | Ht 70.0 in | Wt 145.8 lb

## 2014-06-16 DIAGNOSIS — I5022 Chronic systolic (congestive) heart failure: Secondary | ICD-10-CM

## 2014-06-16 DIAGNOSIS — I2589 Other forms of chronic ischemic heart disease: Secondary | ICD-10-CM

## 2014-06-16 DIAGNOSIS — R0600 Dyspnea, unspecified: Secondary | ICD-10-CM

## 2014-06-16 DIAGNOSIS — E785 Hyperlipidemia, unspecified: Secondary | ICD-10-CM

## 2014-06-16 LAB — BASIC METABOLIC PANEL
BUN: 26 mg/dL — ABNORMAL HIGH (ref 6–23)
CO2: 33 mEq/L — ABNORMAL HIGH (ref 19–32)
Calcium: 9.9 mg/dL (ref 8.4–10.5)
Chloride: 102 mEq/L (ref 96–112)
Creatinine, Ser: 1.5 mg/dL (ref 0.4–1.5)
GFR: 47.37 mL/min — ABNORMAL LOW (ref >60.00)
Glucose, Bld: 85 mg/dL (ref 70–99)
Potassium: 3.9 mEq/L (ref 3.5–5.1)
Sodium: 139 mEq/L (ref 135–145)

## 2014-06-16 LAB — BRAIN NATRIURETIC PEPTIDE: Pro B Natriuretic peptide (BNP): 982 pg/mL — ABNORMAL HIGH (ref 0.0–100.0)

## 2014-06-16 NOTE — Progress Notes (Signed)
Jacob Rosales Date of Birth: July 09, 1928 Medical Record #[id number]  History of Present Illness: [name] is seen back today for a follow up. Seen for Dr. Lovena Le. He is an 80 year old male with known CAD, prior MI with past CABG, and has ICD in place. Other issues include past CHB, chronic systolic heart failure, ICM, prostate cancer with past radiation, vertebral compression fractures and past cervical spine fracture last year, HTN, HLD, TIA, and AF - Noted to be chronic but ? PAF. He is allergic to coumadin. He is on Aggrenox.   Last seen here in January. Felt to be NYHA II. Mostly limited by mobility issues.   I saw him at the end of August - he was more short of breath. He had been off of his Lasix for about 2 months - stopped due to frequent urination. He was more short of breath. I restarted his lasix and updated his CXR - this showed some opacities and I treated him with Avelox as well. Repeat film was stable and showed resolution.  Echo was unchanged.   Comes in today. Here with his wife. He says that everyone came to a consensus and voted "yes" that he is feeling better. No chest pain. Not short of breath. More worried about some bruises on his belly from his shots for his bones - he is on aggrenox which is probably aggravating this.   Current Outpatient Prescriptions  Medication Sig Dispense Refill  . Calcium Carbonate-Vitamin D (CALCIUM 600 + D PO) Take 1 tablet by mouth 2 (two) times daily.      . carvedilol (COREG) 25 MG tablet One twice daily to strengthen the heart  180 tablet  3  . cholecalciferol (VITAMIN D) 1000 UNITS tablet Take 1,000 Units by mouth daily.        Marland Kitchen dipyridamole-aspirin (AGGRENOX) 200-25 MG per 12 hr capsule Take one tablet twice daily to reduce stroke  180 capsule  3  . furosemide (LASIX) 40 MG tablet Take 1 tablet (40 mg total) by mouth daily. DAILY X 3 DAYS, THEN DECREASE TO 20 MG (1/2 PILL) DAILLY  90 tablet  3  . lisinopril (PRINIVIL,ZESTRIL) 20 MG  tablet Take one tablet by mouth once daily to control blood pressure  90 tablet  3  . LORazepam (ATIVAN) 1 MG tablet Take 1 tablet (1 mg total) by mouth at bedtime as needed (for sleep).  30 tablet  5  . moxifloxacin (AVELOX) 400 MG tablet Take 1 tablet (400 mg total) by mouth daily at 8 pm.  5 tablet  0  . senna-docusate (SENOKOT-S) 8.6-50 MG per tablet Take 2 tablets by mouth daily.      . simvastatin (ZOCOR) 20 MG tablet Take one tablet by mouth at bedtime for cholesterol  90 tablet  3  . Solifenacin Succinate (VESICARE PO) Take by mouth daily.      . Teriparatide, Recombinant, 600 MCG/2.4ML SOLN Inject 0.08 mLs (20 mcg total) into the skin daily.  2.4 mL  11  . triamcinolone cream (KENALOG) 0.1 % Apply sparingly up to twice daily to rash until it is resolved  30 g  4  . mirabegron ER (MYRBETRIQ) 50 MG TB24 tablet Take 50 mg by mouth daily.       No current facility-administered medications for this visit.    Allergies  Allergen Reactions  . Warfarin Sodium Other (See Comments)    REACTION: Stomach bleeding  . Amlodipine Besylate Swelling    Past  Medical History  Diagnosis Date  . Complete AV block   . Chronic systolic heart failure   . Cardiomyopathy, ischemic   . Automatic implantable cardiac defibrillator in situ   . CAD (coronary artery disease) of artery bypass graft   . Prostate cancer     S/P radiation rx  . Osteoporosis     A/P Vertebral compression fx's  . Hypertension   . Dyslipidemia   . TIA (transient ischemic attack)     H/o  . Lumbago 10/05/2012  . Cervicalgia 04/06/2012  . 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 Unspecified vitamin D deficiency 10/07/2011  . Unspecified pruritic disorder 10/07/2011  . Rash and other nonspecific skin eruption 10/07/2011  . Basal cell carcinoma of skin of lower limb, including hip 04/08/2011  . Squamous cell carcinoma of skin of lower limb, including hip 04/08/2011  . Unspecified sinusitis (chronic) 10/08/2010  .  Impotence of organic origin 10/08/2010  . Seborrhea 04/09/2010  . Insomnia, unspecified 04/09/2010  . Altered mental status 01/12/2010  . Full incontinence of feces 10/09/2009  . Atrial fibrillation 05/15/2006  . Unspecified urinary incontinence 05/15/2006  . Sebaceous cyst 10/09/2005  . Pathologic fracture of vertebrae 05/16/1991  . Fracture of C2 vertebra, closed     s/p cervical fusion 05/2013  . Osteoporosis with fracture 04/05/2013    History of vertebal fracture.     Past Surgical History  Procedure Laterality Date  . Coronary artery bypass graft    . Hernia repair    . Kyphosis surgery    . Biv-icd  11/21/2010    [employer] ICD Model#CD3231-40 [phone]  . Cardiac defibrillator placement    . Cyst excision  10/2012    back Dr. Wilhemina Bonito  . Cyst removal neck  12/2012    basel cell (right) Dr. Sarajane Jews  . Colonoscopy  2006    polyps benign  . Dexa  April 2010  . Posterior cervical fusion/foraminotomy N/A 05/21/2013    Procedure: Cervical One, Cervical Two, Cervical Three Posterior cervical fusion with lateral mass fixation;  Surgeon: Charlie Pitter, MD;  Location: [facility];  Service: Neurosurgery;  Laterality: N/A;  POSTERIOR CERVICAL FUSION/FORAMINOTOMY LEVEL 2    History  Smoking status  . Former Smoker  . Quit date: 01/13/1962  Smokeless tobacco  . Never Used    History  Alcohol Use  . 1.2 oz/week  . 1 Glasses of wine, 1 Cans of beer per week    Family History  Problem Relation Age of Onset  . Coronary artery disease Neg Hx   . Heart disease Mother     Review of Systems: The review of systems is per the HPI.  All other systems were reviewed and are negative.  Physical Exam: BP 144/82  Pulse 73  Ht 5\' 10"  (1.778 m)  Wt 145 lb 12.8 oz (66.134 kg)  BMI 20.92 kg/m2  SpO2 97% Patient is very pleasant and in no acute distress. Skin is warm and dry. Color is normal.  HEENT is unremarkable. Normocephalic/atraumatic. PERRL. Sclera are nonicteric. Neck is supple. No  masses. No JVD. Lungs are clear. Cardiac exam shows a fairly regular rate and rhythm. Abdomen is soft. Some soft bruises noted from prior injections. Extremities are without edema. Gait and ROM are intact. No gross neurologic deficits noted.  Wt Readings from Last 3 Encounters:  06/16/14 145 lb 12.8 oz (66.134 kg)  04/27/14 146 lb 1.9 oz (66.28 kg)  04/06/14 147 lb (66.679  kg)    LABORATORY DATA/PROCEDURES:  Lab Results  Component Value Date   WBC 5.1 04/27/2014   HGB 13.1 04/27/2014   HCT 39.5 04/27/2014   PLT 162.0 04/27/2014   GLUCOSE 83 04/27/2014   CHOL 131 10/05/2013   TRIG 63 10/05/2013   HDL 50 10/05/2013   LDLCALC 67 10/05/2013   ALT 17 04/27/2014   AST 25 04/27/2014   NA 137 04/27/2014   K 4.1 04/27/2014   CL 104 04/27/2014   CREATININE 1.3 04/27/2014   BUN 23 04/27/2014   CO2 26 04/27/2014   TSH 1.81 04/27/2014   PSA 0.49 Test Methodology: [employer] PSA 01/13/2010   INR 1.0 11/16/2010   HGBA1C  Value: 5.7 (NOTE)                                                                       According to the ADA Clinical Practice Recommendations for 2011, when HbA1c is used as a screening test:   >=6.5%   Diagnostic of Diabetes Mellitus           (if abnormal result  is confirmed)  5.7-6.4%   Increased risk of developing Diabetes Mellitus  References:Diagnosis and Classification of Diabetes Mellitus,Diabetes WUJW,1191,47(WGNFA 1):S62-S69 and Standards of Medical Care in         Diabetes - 2011,Diabetes Care,2011,34  (Suppl 1):S11-S61.* 01/13/2010    BNP (last 3 results)  Recent Labs  04/27/14 1539  PROBNP 1024.0*   Echo Study Conclusions  - Left ventricle: The cavity size was moderately dilated. Systolic function was severely reduced. The estimated ejection fraction was in the range of 25% to 30%. Akinesis of the inferior myocardium. - Aortic valve: There was mild regurgitation. - Mitral valve: There was moderate regurgitation. - Left atrium: The atrium was moderately dilated. - Right  atrium: The atrium was moderately dilated. - Pulmonary arteries: Systolic pressure was mildly increased. PA peak pressure: 35 mm Hg (S).      CHEST 2 VIEW  COMPARISON: 04/27/2014  FINDINGS:  Changes from CABG surgery are stable. Cardiac silhouette is mildly  enlarged. No mediastinal or hilar masses.  Lung base opacities have essentially resolved. There is no evidence  of pneumonia. No pulmonary edema. No pleural effusion or  pneumothorax.  Bony thorax is demineralized. There are old compression fractures  along the thoracic spine 1 treated with previous vertebroplasty.  Left anterior chest wall AICD is stable and well positioned.  IMPRESSION:  1. Resolved lung base opacities. No evidence of pneumonia or  pulmonary edema. No acute cardiopulmonary disease.  Electronically Signed  By: Lajean Manes M.D.  On: 05/13/2014 16:41  Assessment / Plan: 1. ICM with chronic systolic HF - echo stable - he looks stable clinically. I have left him on his current regimen. Recheck BMET and BNP today. See back in January per his recall.   2. CAD - no active chest pain   3. Underlying ICD - to see Dr. Lovena Le in January.   4. AF - probably PAF - on Aggrenox - has had prior bleed on Coumadin.   Patient is agreeable to this plan and will call if any problems develop in the interim.   Burtis Junes, RN, [place] [phone]  [facility] [address] [place], [place]  [id number] [phone]

## 2014-06-16 NOTE — Patient Instructions (Signed)
We will be checking the following labs today BMET & BNP  Stay on your current medicines  Needs OV with Dr. Lovena Le for device check/OV in January  Call the [place] office at [phone] if you have any questions, problems or concerns.

## 2014-07-05 LAB — LIPID PANEL
Cholesterol: 120 mg/dL (ref 0–200)
HDL: 49 mg/dL (ref 35–70)
LDL Cholesterol: 63 mg/dL
LDL/HDL RATIO: 1.3
TRIGLYCERIDES: 76 mg/dL (ref 40–160)

## 2014-07-05 LAB — BASIC METABOLIC PANEL
BUN: 28 mg/dL — AB (ref 4–21)
CREATININE: 1.4 mg/dL — AB (ref 0.6–1.3)
Glucose: 81 mg/dL
POTASSIUM: 3.8 mmol/L (ref 3.4–5.3)
Sodium: 141 mmol/L (ref 137–147)

## 2014-07-07 ENCOUNTER — Telehealth: Payer: Self-pay | Admitting: Cardiology

## 2014-07-07 ENCOUNTER — Encounter: Payer: Self-pay | Admitting: Internal Medicine

## 2014-07-07 ENCOUNTER — Ambulatory Visit (INDEPENDENT_AMBULATORY_CARE_PROVIDER_SITE_OTHER): Payer: Medicare Other | Admitting: *Deleted

## 2014-07-07 DIAGNOSIS — I5022 Chronic systolic (congestive) heart failure: Secondary | ICD-10-CM

## 2014-07-07 DIAGNOSIS — I429 Cardiomyopathy, unspecified: Secondary | ICD-10-CM

## 2014-07-07 LAB — MDC_IDC_ENUM_SESS_TYPE_REMOTE
Battery Voltage: 2.86 V
Brady Statistic AP VP Percent: 91 %
Brady Statistic AP VS Percent: 1 %
Brady Statistic AS VP Percent: 7.8 %
Brady Statistic AS VS Percent: 1 %
Date Time Interrogation Session: 20151105222441
HighPow Impedance: 42 Ohm
Implantable Pulse Generator Serial Number: 631860
Lead Channel Impedance Value: 310 Ohm
Lead Channel Impedance Value: 590 Ohm
Lead Channel Pacing Threshold Amplitude: 1 V
Lead Channel Pacing Threshold Pulse Width: 1 ms
Lead Channel Sensing Intrinsic Amplitude: 12 mV
Lead Channel Setting Pacing Amplitude: 2 V
Lead Channel Setting Pacing Amplitude: 2.5 V
Lead Channel Setting Pacing Amplitude: 2.5 V
Lead Channel Setting Pacing Pulse Width: 0.5 ms
Lead Channel Setting Pacing Pulse Width: 1 ms
MDC IDC MSMT BATTERY REMAINING LONGEVITY: 24 mo
MDC IDC MSMT BATTERY REMAINING PERCENTAGE: 36 %
MDC IDC MSMT LEADCHNL LV PACING THRESHOLD AMPLITUDE: 1 V
MDC IDC MSMT LEADCHNL RA PACING THRESHOLD AMPLITUDE: 0.75 V
MDC IDC MSMT LEADCHNL RA PACING THRESHOLD PULSEWIDTH: 0.8 ms
MDC IDC MSMT LEADCHNL RA SENSING INTR AMPL: 3 mV
MDC IDC MSMT LEADCHNL RV IMPEDANCE VALUE: 330 Ohm
MDC IDC MSMT LEADCHNL RV PACING THRESHOLD PULSEWIDTH: 0.5 ms
MDC IDC SET LEADCHNL RV SENSING SENSITIVITY: 2 mV
MDC IDC SET ZONE DETECTION INTERVAL: 335 ms
MDC IDC STAT BRADY RA PERCENT PACED: 91 %
Zone Setting Detection Interval: 300 ms

## 2014-07-07 NOTE — Telephone Encounter (Signed)
LMOVM reminding pt to send remote transmission.   

## 2014-07-08 NOTE — Progress Notes (Signed)
Remote ICD transmission.   

## 2014-07-11 ENCOUNTER — Encounter: Payer: Self-pay | Admitting: Internal Medicine

## 2014-07-11 ENCOUNTER — Non-Acute Institutional Stay: Payer: Medicare Other | Admitting: Internal Medicine

## 2014-07-11 VITALS — BP 130/82 | HR 72 | Wt 142.0 lb

## 2014-07-11 DIAGNOSIS — I2589 Other forms of chronic ischemic heart disease: Secondary | ICD-10-CM

## 2014-07-11 DIAGNOSIS — I482 Chronic atrial fibrillation, unspecified: Secondary | ICD-10-CM

## 2014-07-11 DIAGNOSIS — M8080XD Other osteoporosis with current pathological fracture, unspecified site, subsequent encounter for fracture with routine healing: Secondary | ICD-10-CM

## 2014-07-11 DIAGNOSIS — S12100D Unspecified displaced fracture of second cervical vertebra, subsequent encounter for fracture with routine healing: Secondary | ICD-10-CM

## 2014-07-11 DIAGNOSIS — I10 Essential (primary) hypertension: Secondary | ICD-10-CM

## 2014-07-11 DIAGNOSIS — I5022 Chronic systolic (congestive) heart failure: Secondary | ICD-10-CM

## 2014-07-11 DIAGNOSIS — R413 Other amnesia: Secondary | ICD-10-CM

## 2014-07-11 DIAGNOSIS — R634 Abnormal weight loss: Secondary | ICD-10-CM

## 2014-07-11 DIAGNOSIS — R2681 Unsteadiness on feet: Secondary | ICD-10-CM

## 2014-07-20 ENCOUNTER — Encounter: Payer: Self-pay | Admitting: Cardiology

## 2014-07-25 DIAGNOSIS — R2681 Unsteadiness on feet: Secondary | ICD-10-CM | POA: Insufficient documentation

## 2014-07-25 DIAGNOSIS — R413 Other amnesia: Secondary | ICD-10-CM | POA: Insufficient documentation

## 2014-07-25 HISTORY — DX: Other amnesia: R41.3

## 2014-07-25 NOTE — Progress Notes (Signed)
Patient ID: Jacob Rosales, male   DOB: Nov 07, 1927, 80 y.o.   MRN: [id number]    [place]     Place of Service: Clinic (12)    Allergies  Allergen Reactions   Warfarin Sodium Other (See Comments)    REACTION: Stomach bleeding   Amlodipine Besylate Swelling    Chief Complaint  Patient presents with   Medical Management of Chronic Issues    blood pressure, A-Fib, CHF. Here with wife.  Wants Dr. Nyoka Cowden to confirm he needs a cane    HPI:  Patient presents today for routine follow-up pressure, atrial fibrillation, congestive heart failure.  There are multiple other issues that he brings up the course of this visit.  He has a balance problem for which he uses a cane. There is evidence of weak quadriceps muscles bilaterally. He would like a prescription for a cane.  Forteo was started in May 2015 and he is to use it until May 2007 he says he bruises with the shots but he is only using the upper quadrant of the abdomen  Dr. Vikki Ports has seen this patient. He is currently trying to lie [place] to help with incontinence. Myrbetriq and Vesicare were of no help.  There is an issue with memory loss. I think this is most likely vascular in origin.  Dyspnea has been present for several weeks. He had an attack about 6 weeks prior to this visit. He had a chest x-ray at that time was put on an antibiotic. Breathing is currently better. He has had a 2-D echocardiogram with the cardiologist which is unremarkable.  He had a bone density scan of the heels [facility] which showed T score of -2.2.  Medications: Patient's Medications  New Prescriptions   No medications on file  Previous Medications   CALCIUM CARBONATE-VITAMIN D (CALCIUM 600 + D PO)    Take 1 tablet by mouth 2 (two) times daily.   CARVEDILOL (COREG) 25 MG TABLET    One twice daily to strengthen the heart   CHOLECALCIFEROL (VITAMIN D) 1000 UNITS TABLET    Take 1,000 Units by mouth daily.     DIPYRIDAMOLE-ASPIRIN (AGGRENOX) 200-25 MG PER 12 HR CAPSULE    Take one tablet twice daily to reduce stroke   FUROSEMIDE (LASIX) 40 MG TABLET    Take 1 tablet (40 mg total) by mouth daily. DAILY X 3 DAYS, THEN DECREASE TO 20 MG (1/2 PILL) DAILLY   LISINOPRIL (PRINIVIL,ZESTRIL) 20 MG TABLET    Take one tablet by mouth once daily to control blood pressure   LORAZEPAM (ATIVAN) 1 MG TABLET    Take 1 tablet (1 mg total) by mouth at bedtime as needed (for sleep).   MOXIFLOXACIN (AVELOX) 400 MG TABLET    Take 1 tablet (400 mg total) by mouth daily at 8 pm.   SENNA-DOCUSATE (SENOKOT-S) 8.6-50 MG PER TABLET    Take 2 tablets by mouth daily.   SIMVASTATIN (ZOCOR) 20 MG TABLET    Take one tablet by mouth at bedtime for cholesterol   SOLIFENACIN SUCCINATE (VESICARE PO)    Take by mouth daily.   TERIPARATIDE, RECOMBINANT, 600 MCG/2.4ML SOLN    Inject 0.08 mLs (20 mcg total) into the skin daily.   TOVIAZ 8 MG TB24 TABLET    Take one tablet daily for bladder control   TRIAMCINOLONE CREAM (KENALOG) 0.1 %    Apply sparingly up to twice daily to rash until it is resolved  Modified Medications   No  medications on file  Discontinued Medications   MIRABEGRON ER (MYRBETRIQ) 50 MG TB24 TABLET    Take 50 mg by mouth daily.     Review of Systems  Constitutional: Negative for fever, activity change, appetite change, fatigue and unexpected weight change.  HENT: Positive for rhinorrhea. Negative for ear pain, facial swelling and sinus pressure.   Eyes: Negative for photophobia, pain, discharge, redness and itching.  Cardiovascular: Negative for chest pain, palpitations and leg swelling.       PAF.AICD.  Gastrointestinal: Negative for nausea, abdominal pain, abdominal distention and rectal pain.       HX. Fecal incontinence. Brown staining when he wipes rectum. Hasa postprandial fecal urgency. Loss of appetite since fx of C2.  Endocrine: Negative.   Genitourinary:       Hx urinary incontinence. Leakage at night.    Musculoskeletal: Positive for myalgias and back pain. Negative for neck pain.       S/P fx C2 and surgery for stabilization. Low back discomfort.  Skin: Positive for rash.  Neurological:       Right foot drop  Hematological: Negative.   Psychiatric/Behavioral: Negative.     Filed Vitals:   07/11/14 1427  BP: 130/82  Pulse: 72  Weight: 142 lb (64.411 kg)  SpO2: 99%   Body mass index is 20.37 kg/(m^2).  Physical Exam  Constitutional:  Frail elderly male.  HENT:  Right Ear: External ear normal.  Left Ear: External ear normal.  Nose: Nose normal.  Eyes: Conjunctivae and EOM are normal. Left eye exhibits no discharge.  Neck: Normal range of motion. Neck supple. No JVD present. No tracheal deviation present. No thyromegaly present.  Wearing cervical collar.  Cardiovascular: Normal rate and regular rhythm.  Exam reveals no gallop and no friction rub.   No murmur heard. Pulmonary/Chest: Effort normal. No respiratory distress. He has no wheezes. He has no rales.  Abdominal: Soft. Bowel sounds are normal. He exhibits no distension and no mass. There is no tenderness.  Musculoskeletal: Normal range of motion. He exhibits no edema or tenderness.  Right AFO. Wearing the soft neck collar.  Lymphadenopathy:    He has no cervical adenopathy.  Neurological: He is alert. He has normal reflexes. No cranial nerve deficit.  Right foot drop.  Skin: Skin is warm and dry. Rash (seborrhea at the sternal area) noted.  Psychiatric: He has a normal mood and affect. His behavior is normal. Judgment and thought content normal.     Labs reviewed: Nursing Home on 07/11/2014  Component Date Value Ref Range Status   Glucose 07/05/2014 81   Final   BUN 07/05/2014 28* 4 - 21 mg/dL Final   Creatinine 07/05/2014 1.4* 0.6 - 1.3 mg/dL Final   Potassium 07/05/2014 3.8  3.4 - 5.3 mmol/L Final   Sodium 07/05/2014 141  137 - 147 mmol/L Final   LDl/HDL Ratio 07/05/2014 1.3   Final   Triglycerides  07/05/2014 76  40 - 160 mg/dL Final   Cholesterol 07/05/2014 120  0 - 200 mg/dL Final   HDL 07/05/2014 49  35 - 70 mg/dL Final   LDL Cholesterol 07/05/2014 63   Final  Clinical Support on 07/07/2014  Component Date Value Ref Range Status   Date Time Interrogation Session 07/07/2014 [id number]   Final   Pulse Generator Manufacturer 07/07/2014 [employer]. Jude Medical   Final   Pulse Gen Model 07/07/2014 3231-40 Unify   Final   Pulse Gen Serial Number 07/07/2014 [id number]   Final  RV Sense Sensitivity 07/07/2014 2   Final   RV Adaptation Mode 07/07/2014 Adaptive Sensing   Final   LV Pace PulseWidth 07/07/2014 1   Final   LV Pace Amplitude 07/07/2014 2.5   Final   LV Pace Capture Mode 07/07/2014 Fixed Pacing   Final   RA Pace Amplitude 07/07/2014 2   Final   RV Pace PulseWidth 07/07/2014 0.5   Final   RV Pace Amplitude 07/07/2014 2.5   Final   Zone Setting Type Category 07/07/2014 VF   Final   Zone Detect Interval 07/07/2014 300   Final   Zone Setting Type Category 07/07/2014 VT   Final   Zone Setting Type Category 07/07/2014 VENTRICULAR_TACHYCARDIA_1   Final   Zone Detect Interval 07/07/2014 335   Final   Lead Channel Status 07/07/2014    Final   LV Impedance 07/07/2014 590   Final   LV Pacing Amplitude 07/07/2014 1   Final   LV PACING PULSEWIDTH 07/07/2014 1   Final   Lead Channel Status 07/07/2014    Final   RA Impedance 07/07/2014 310   Final   RA Amplitude 07/07/2014 3   Final   RA Pacing Amplitude 07/07/2014 0.75   Final   RA Pacing PulseWidth 07/07/2014 0.8   Final   Lead Channel Status 07/07/2014    Final   RV IMPEDANCE 07/07/2014 330   Final   RV Amplitude 07/07/2014 12   Final   RV Pacing Amplitude 07/07/2014 1   Final   RV Pacing PulseWidth 07/07/2014 0.5   Final   HighPow Impedance 07/07/2014 42   Final   HighPow Imped Status 07/07/2014    Final   Battery Status 07/07/2014 MOS   Final   Battery Longevity 07/07/2014 24    Final   Battery Percent 07/07/2014 36   Final   Battery Voltage 07/07/2014 2.86   Final   Brady RA Perc Paced 07/07/2014 91   Final   Brady AP VP Percent 07/07/2014 91   Final   Brady AS VP Percent 07/07/2014 7.8   Final   Brady AP VS Percent 07/07/2014 1   Final   Brady AS VS Percent 07/07/2014 1   Final   Eval Rhythm 07/07/2014 Ap-Vp   Final   Miscellaneous Comment 07/07/2014    Final                   Value:Remote CRT-D device check. Thresholds and sensing consistent with previous device measurements. Lead impedance trends stable over time. No mode switch episodes recorded. No ventricular arrhythmia episodes recorded. Patient bi-ventricularly pacing >99 %  of the time. Device programmed with appropriate safety margins. Heart failure diagnostics reviewed and trends are stable for patient.  Estimated longevity 1.8 years.  ROV 09/13/14@ 11am with Ddr. Lovena Le.   Office Visit on 06/16/2014  Component Date Value Ref Range Status   Pro B Natriuretic peptide (BNP) 06/16/2014 982.0* 0.0 - 100.0 pg/mL Final   Sodium 06/16/2014 139  135 - 145 mEq/L Final   Potassium 06/16/2014 3.9  3.5 - 5.1 mEq/L Final   Chloride 06/16/2014 102  96 - 112 mEq/L Final   CO2 06/16/2014 33* 19 - 32 mEq/L Final   Glucose, Bld 06/16/2014 85  70 - 99 mg/dL Final   BUN 06/16/2014 26* 6 - 23 mg/dL Final   Creatinine, Ser 06/16/2014 1.5  0.4 - 1.5 mg/dL Final   Calcium 06/16/2014 9.9  8.4 - 10.5 mg/dL Final   GFR 06/16/2014  47.37* >60.00 mL/min Final  Orders Only on 04/27/2014  Component Date Value Ref Range Status   Date Time Interrogation Session 04/27/2014 [id number]   Final   Pulse Generator Manufacturer 04/27/2014 [employer]. Jude Medical   Final   Pulse Gen Model 04/27/2014 3231-40 Unify   Final   Pulse Gen Serial Number 04/27/2014 [id number]   Final   RV Sense Sensitivity 04/27/2014 2.0   Final   RA Pace Amplitude 04/27/2014 2.0   Final   RV Pace PulseWidth 04/27/2014 0.5   Final   RV Pace  Amplitude 04/27/2014 2.5   Final   LV Pace PulseWidth 04/27/2014 1.0   Final   LV Pace Amplitude 04/27/2014 2.5   Final   Zone Setting Type Category 04/27/2014 VF   Final   Zone Detect Interval 04/27/2014 300   Final   Zone Setting Type Category 04/27/2014 VT   Final   Zone Setting Type Category 04/27/2014 VENTRICULAR_TACHYCARDIA_1   Final   Zone Detect Interval 04/27/2014 335   Final   RA Impedance 04/27/2014 337.5   Final   RA Amplitude 04/27/2014 1.3   Final   RA Pacing Amplitude 04/27/2014 0.75   Final   RA Pacing PulseWidth 04/27/2014 0.8   Final   RA Pacing Amplitude 04/27/2014 0.75   Final   RA Pacing PulseWidth 04/27/2014 0.8   Final   RV IMPEDANCE 04/27/2014 337.5   Final   RV Amplitude 04/27/2014 12.0   Final   RV Pacing Amplitude 04/27/2014 1.0   Final   RV Pacing PulseWidth 04/27/2014 0.5   Final   RV Pacing Amplitude 04/27/2014 1.0   Final   RV Pacing PulseWidth 04/27/2014 0.5   Final   HighPow Impedance 04/27/2014 40   Final   LV Impedance 04/27/2014 587.5   Final   LV Pacing Amplitude 04/27/2014 1.0   Final   LV PACING PULSEWIDTH 04/27/2014 1.0   Final   LV Pacing Amplitude 04/27/2014 1.0   Final   LV PACING PULSEWIDTH 04/27/2014 1.0   Final   Battery Longevity 04/27/2014 22.8   Final   Brady RA Perc Paced 04/27/2014 91.0   Final   Brady RV Perc Paced 04/27/2014 98.0   Final   Eval Rhythm 04/27/2014 Vp@30    Final   Miscellaneous Comment 04/27/2014 Quick check per [employer] for AT/AF & CorVue. Pt has had 5 mode switches--- longest 36 sec, fastest 197bpm. CorVue up 7/13 x20 days and 7/4 x7 days. Follow up for device as planned: Merlin 07/07/14 & ROV w/ GT 1/16.   Final  Office Visit on 04/27/2014  Component Date Value Ref Range Status   Pro B Natriuretic peptide (BNP) 04/27/2014 1024.0* 0.0 - 100.0 pg/mL Final   Sodium 04/27/2014 137  135 - 145 mEq/L Final   Potassium 04/27/2014 4.1  3.5 - 5.1 mEq/L Final   Chloride 04/27/2014 104  96 - 112  mEq/L Final   CO2 04/27/2014 26  19 - 32 mEq/L Final   Glucose, Bld 04/27/2014 83  70 - 99 mg/dL Final   BUN 04/27/2014 23  6 - 23 mg/dL Final   Creatinine, Ser 04/27/2014 1.3  0.4 - 1.5 mg/dL Final   Calcium 04/27/2014 9.6  8.4 - 10.5 mg/dL Final   GFR 04/27/2014 54.92* >60.00 mL/min Final   WBC 04/27/2014 5.1  4.0 - 10.5 K/uL Final   RBC 04/27/2014 4.12* 4.22 - 5.81 Mil/uL Final   Platelets 04/27/2014 162.0  150.0 - 400.0 K/uL Final   Hemoglobin 04/27/2014 13.1  13.0 - 17.0 g/dL Final   HCT 04/27/2014 39.5  39.0 - 52.0 % Final   MCV 04/27/2014 95.9  78.0 - 100.0 fl Final   MCHC 04/27/2014 33.0  30.0 - 36.0 g/dL Final   RDW 04/27/2014 14.9  11.5 - 15.5 % Final   Total Bilirubin 04/27/2014 1.1  0.2 - 1.2 mg/dL Final   Bilirubin, Direct 04/27/2014 0.1  0.0 - 0.3 mg/dL Final   Alkaline Phosphatase 04/27/2014 38* 39 - 117 U/L Final   AST 04/27/2014 25  0 - 37 U/L Final   ALT 04/27/2014 17  0 - 53 U/L Final   Total Protein 04/27/2014 7.1  6.0 - 8.3 g/dL Final   Albumin 04/27/2014 3.9  3.5 - 5.2 g/dL Final   TSH 04/27/2014 1.81  0.35 - 4.50 uIU/mL Final     Assessment/Plan 1. Unstable gait Prescription given patient for a cane  2. Essential hypertension Controlled  3. Chronic systolic heart failure Compensated  4. Chronic atrial fibrillation Rate controlled  5. Fracture of second cervical vertebra, with routine healing, subsequent encounter Comfortable. Denies significant pain.  6. Loss of weight Regain weight  7. Osteoporosis with fracture, with routine healing, subsequent encounter Finished Forteo as planned in May 2017  8. Memory loss Follow-up MMSE next visit   Lab work prior to next visit should include CMP and lipid panel. MMSE should be done at that visit.

## 2014-08-04 ENCOUNTER — Telehealth: Payer: Self-pay

## 2014-08-04 ENCOUNTER — Telehealth: Payer: Self-pay | Admitting: Nurse Practitioner

## 2014-08-04 NOTE — Telephone Encounter (Signed)
Spoke with Mliss Sax from [facility], who reports patient was awakened at 4;45 this am with SOb. No other symptoms reported. Feels ok this AM. They want his appointment with Dr. Lovena Le moved up from Jan. To this month if possible. Or can he see a PA?

## 2014-08-04 NOTE — Telephone Encounter (Signed)
Patient reports he is still Sob, which is different from original message. No swelling, no chest pain. Has already seen lori, so doesn't want to see her again. Can we move his appt up?

## 2014-08-04 NOTE — Telephone Encounter (Signed)
New Msg   Bernadette the nurse from [facility] is calling to report at 4:45 am pt had shortness of breath. Pt is stable at the moment but nurse would like a call back and would like an earlier appt for pt. Please contact [facility] at [phone]

## 2014-08-05 ENCOUNTER — Encounter: Payer: Self-pay | Admitting: *Deleted

## 2014-08-05 NOTE — Telephone Encounter (Signed)
Jacob Rosales is going to call and offer 12/29 with Dr. Lovena Le or sooner with PA

## 2014-08-10 ENCOUNTER — Ambulatory Visit (INDEPENDENT_AMBULATORY_CARE_PROVIDER_SITE_OTHER): Payer: Medicare Other | Admitting: Internal Medicine

## 2014-08-10 ENCOUNTER — Encounter: Payer: Self-pay | Admitting: Internal Medicine

## 2014-08-10 ENCOUNTER — Other Ambulatory Visit: Payer: Self-pay | Admitting: Internal Medicine

## 2014-08-10 VITALS — BP 110/68 | HR 81 | Ht 71.0 in | Wt 148.4 lb

## 2014-08-10 DIAGNOSIS — Z95810 Presence of automatic (implantable) cardiac defibrillator: Secondary | ICD-10-CM

## 2014-08-10 DIAGNOSIS — I442 Atrioventricular block, complete: Secondary | ICD-10-CM

## 2014-08-10 DIAGNOSIS — I482 Chronic atrial fibrillation, unspecified: Secondary | ICD-10-CM

## 2014-08-10 DIAGNOSIS — I5022 Chronic systolic (congestive) heart failure: Secondary | ICD-10-CM

## 2014-08-10 DIAGNOSIS — I2589 Other forms of chronic ischemic heart disease: Secondary | ICD-10-CM

## 2014-08-10 LAB — MDC_IDC_ENUM_SESS_TYPE_INCLINIC
Battery Remaining Longevity: 20.4 mo
Brady Statistic RV Percent Paced: 98 %
Date Time Interrogation Session: 20151209195835
HIGH POWER IMPEDANCE MEASURED VALUE: 40.1758
Lead Channel Impedance Value: 312.5 Ohm
Lead Channel Impedance Value: 325 Ohm
Lead Channel Pacing Threshold Pulse Width: 0.5 ms
Lead Channel Pacing Threshold Pulse Width: 0.8 ms
Lead Channel Pacing Threshold Pulse Width: 1 ms
Lead Channel Sensing Intrinsic Amplitude: 12 mV
Lead Channel Setting Pacing Amplitude: 2.5 V
Lead Channel Setting Pacing Amplitude: 2.5 V
Lead Channel Setting Pacing Pulse Width: 0.5 ms
Lead Channel Setting Pacing Pulse Width: 1 ms
Lead Channel Setting Sensing Sensitivity: 2 mV
MDC IDC MSMT LEADCHNL LV IMPEDANCE VALUE: 587.5 Ohm
MDC IDC MSMT LEADCHNL LV PACING THRESHOLD AMPLITUDE: 1.25 V
MDC IDC MSMT LEADCHNL RA PACING THRESHOLD AMPLITUDE: 0.75 V
MDC IDC MSMT LEADCHNL RA SENSING INTR AMPL: 3.3 mV
MDC IDC MSMT LEADCHNL RV PACING THRESHOLD AMPLITUDE: 1.25 V
MDC IDC PG SERIAL: 631860
MDC IDC SET LEADCHNL RA PACING AMPLITUDE: 2 V
MDC IDC SET ZONE DETECTION INTERVAL: 335 ms
MDC IDC STAT BRADY RA PERCENT PACED: 90 %
Zone Setting Detection Interval: 300 ms

## 2014-08-10 NOTE — Patient Instructions (Signed)
Your physician wants you to follow-up in: 12 months with Dr. Taylor You will receive a reminder letter in the mail two months in advance. If you don't receive a letter, please call our office to schedule the follow-up appointment.  Remote monitoring is used to monitor your Pacemaker of ICD from home. This monitoring reduces the number of office visits required to check your device to one time per year. It allows us to keep an eye on the functioning of your device to ensure it is working properly. You are scheduled for a device check from home on 11/09/14. You may send your transmission at any time that day. If you have a wireless device, the transmission will be sent automatically. After your physician reviews your transmission, you will receive a postcard with your next transmission date.    

## 2014-08-11 NOTE — Assessment & Plan Note (Signed)
The patient had mild worsening of his heart failure symptoms, which have now improved. He appears to be euvolemic, and back to his baseline. We considered adjusting his diuretic therapy, but will hold off on this for now. I strongly encouraged the patient to reduce his salt intake. He states that in retrospect, he may have eaten a little more salt than normal.

## 2014-08-11 NOTE — Assessment & Plan Note (Signed)
The patient denies anginal symptoms. He will continue his current medication. He is fairly sedentary, and I have encouraged him to increase his physical activity as tolerated.

## 2014-08-11 NOTE — Progress Notes (Signed)
HPI Jacob Rosales returns today for followup. He is a very pleasant 80 year old man with a history of coronary artery disease, status post myocardial infarction, status post bypass surgery, status post ICD implantation. In the interim, he has recovered from his spinal fracture, although he appears to be more frail. He denies chest pain or syncope. He has class II heart failure symptoms. He denies any ICD shock. The patient called our office 2 days ago complaining of shortness of breath. He is seen today for additional evaluation of this. He denies dietary indiscretion with sodium. He notes modest peripheral edema, which has now improved. He denies sodium indiscretion. Allergies  Allergen Reactions  . Warfarin Sodium Other (See Comments)    REACTION: Stomach bleeding  . Amlodipine Besylate Swelling     Current Outpatient Prescriptions  Medication Sig Dispense Refill  . Calcium Carbonate-Vitamin D (CALCIUM 600 + D PO) Take 1 tablet by mouth 2 (two) times daily.    . carvedilol (COREG) 25 MG tablet One twice daily to strengthen the heart (Patient taking differently: Take one tablet by mouth twice daily to strengthen the hear) 180 tablet 3  . cholecalciferol (VITAMIN D) 1000 UNITS tablet Take 1,000 Units by mouth daily.      Marland Kitchen dipyridamole-aspirin (AGGRENOX) 200-25 MG per 12 hr capsule Take one tablet twice daily to reduce stroke (Patient taking differently: Take one tablet by mouth twice daily to reduce stroke) 180 capsule 3  . furosemide (LASIX) 20 MG tablet Take 20 mg by mouth daily.    Marland Kitchen lisinopril (PRINIVIL,ZESTRIL) 20 MG tablet Take one tablet by mouth once daily to control blood pressure 90 tablet 3  . LORazepam (ATIVAN) 1 MG tablet Take 1 tablet (1 mg total) by mouth at bedtime as needed (for sleep). 30 tablet 5  . senna-docusate (SENOKOT-S) 8.6-50 MG per tablet Take 2 tablets by mouth daily.    . simvastatin (ZOCOR) 20 MG tablet Take one tablet by mouth at bedtime for cholesterol 90 tablet 3  .  Teriparatide, Recombinant, 600 MCG/2.4ML SOLN Inject 0.08 mLs (20 mcg total) into the skin daily. 2.4 mL 11  . TOVIAZ 8 MG TB24 tablet Take one tablet by mouth daily for bladder control  11  . triamcinolone (KENALOG) 0.025 % cream Apply 1 application topically 2 (two) times daily as needed (rash).     No current facility-administered medications for this visit.     Past Medical History  Diagnosis Date  . Complete AV block   . Chronic systolic heart failure   . Cardiomyopathy, ischemic   . Automatic implantable cardiac defibrillator in situ   . CAD (coronary artery disease) of artery bypass graft   . Prostate cancer     S/P radiation rx  . Osteoporosis     A/P Vertebral compression fx's  . Hypertension   . Dyslipidemia   . TIA (transient ischemic attack)     H/o  . Lumbago 10/05/2012  . Cervicalgia 04/06/2012  . 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 Unspecified vitamin D deficiency 10/07/2011  . Unspecified pruritic disorder 10/07/2011  . Rash and other nonspecific skin eruption 10/07/2011  . Basal cell carcinoma of skin of lower limb, including hip 04/08/2011  . Squamous cell carcinoma of skin of lower limb, including hip 04/08/2011  . Unspecified sinusitis (chronic) 10/08/2010  . Impotence of organic origin 10/08/2010  . Seborrhea 04/09/2010  . Insomnia, unspecified 04/09/2010  . Altered mental status 01/12/2010  . Full incontinence of feces 10/09/2009  .  Atrial fibrillation 05/15/2006  . Unspecified urinary incontinence 05/15/2006  . Sebaceous cyst 10/09/2005  . Pathologic fracture of vertebrae 05/16/1991  . Fracture of C2 vertebra, closed     s/p cervical fusion 05/2013  . Osteoporosis with fracture 04/05/2013    History of vertebal fracture.     ROS:   All systems reviewed and negative except as noted in the HPI.   Past Surgical History  Procedure Laterality Date  . Coronary artery bypass graft    . Hernia repair    . Kyphosis surgery    . Biv-icd  11/21/2010    [employer] ICD Model#CD3231-40 [phone]  . Cardiac defibrillator placement    . Cyst excision  10/2012    back Dr. Wilhemina Rosales  . Cyst removal neck  12/2012    basel cell (right) Dr. Sarajane Rosales  . Colonoscopy  2006    polyps benign  . Dexa  April 2010  . Posterior cervical fusion/foraminotomy N/A 05/21/2013    Procedure: Cervical One, Cervical Two, Cervical Three Posterior cervical fusion with lateral mass fixation;  Surgeon: Jacob Pitter, MD;  Location: [facility];  Service: Neurosurgery;  Laterality: N/A;  POSTERIOR CERVICAL FUSION/FORAMINOTOMY LEVEL 2     Family History  Problem Relation Age of Onset  . Coronary artery disease Neg Hx   . Heart disease Mother      History   Social History  . Marital Status: Married    Spouse Name: N/A    Number of Children: N/A  . Years of Education: N/A   Occupational History  . Retired Chief Financial Officer    Social History Main Topics  . Smoking status: Former Smoker    Quit date: 01/13/1962  . Smokeless tobacco: Never Used  . Alcohol Use: 1.2 oz/week    1 Glasses of wine, 1 Cans of beer per week  . Drug Use: No  . Sexual Activity: Not Currently   Other Topics Concern  . Not on file   Social History Narrative   Patient is Married Jacob Rosales), retired Chief Financial Officer.    Lives with spouse in single level home at [place] since 2010.   Smoked cigars, and pipe, stopped 1960's,  minimal alcohol history .   Patient has Advanced planning documents:  Living Will, DNR   Walks with cane                 BP 110/68 mmHg  Pulse 81  Ht 5\' 11"  (1.803 m)  Wt 148 lb 6.4 oz (67.314 kg)  BMI 20.71 kg/m2  Physical Exam:  Stable but frail appearing elderly man, NAD HEENT: Unremarkable Neck:  7 cm JVD, no thyromegally Lungs:  Clear with no wheezes, rales, or rhonchi. HEART:  Regular rate rhythm, no murmurs, no rubs, no clicks Abd:  soft, positive bowel sounds, no organomegally, no rebound, no guarding Ext:  2 plus pulses, trace  peripheral edema, no cyanosis, no clubbing Skin:  No rashes no nodules Neuro:  CN II through XII intact, motor grossly intact   DEVICE  Normal device function.  See PaceArt for details.   Assess/Plan:

## 2014-08-11 NOTE — Assessment & Plan Note (Signed)
His [employer] device is working normally. We'll plan to recheck in several months.

## 2014-08-11 NOTE — Assessment & Plan Note (Signed)
Interrogation of his device demonstrates that his ventricular rate has been well-controlled. He will continue his current medical therapy.

## 2014-08-12 ENCOUNTER — Encounter: Payer: Self-pay | Admitting: Internal Medicine

## 2014-09-08 ENCOUNTER — Other Ambulatory Visit: Payer: Self-pay | Admitting: *Deleted

## 2014-09-08 MED ORDER — AMBULATORY NON FORMULARY MEDICATION: Status: DC

## 2014-09-08 NOTE — Telephone Encounter (Signed)
[facility]

## 2014-09-13 ENCOUNTER — Encounter: Payer: Medicare Other | Admitting: Internal Medicine

## 2014-09-19 ENCOUNTER — Other Ambulatory Visit: Payer: Self-pay | Admitting: *Deleted

## 2014-09-19 MED ORDER — AMBULATORY NON FORMULARY MEDICATION: Status: DC

## 2014-09-19 NOTE — Telephone Encounter (Signed)
[facility] Fax Order

## 2014-09-26 ENCOUNTER — Other Ambulatory Visit: Payer: Self-pay | Admitting: *Deleted

## 2014-09-26 MED ORDER — TERIPARATIDE (RECOMBINANT) 600 MCG/2.4ML ~~LOC~~ SOLN: 20.0000 ug | Freq: Every day | SUBCUTANEOUS | Status: DC

## 2014-09-26 NOTE — Telephone Encounter (Signed)
[facility] faxed Rx request.

## 2014-09-29 ENCOUNTER — Other Ambulatory Visit: Payer: Self-pay | Admitting: *Deleted

## 2014-09-29 MED ORDER — TERIPARATIDE (RECOMBINANT) 600 MCG/2.4ML ~~LOC~~ SOLN: 20.0000 ug | Freq: Every day | SUBCUTANEOUS | Status: DC

## 2014-09-29 NOTE — Telephone Encounter (Signed)
[facility]

## 2014-10-17 ENCOUNTER — Other Ambulatory Visit: Payer: Self-pay | Admitting: *Deleted

## 2014-10-17 MED ORDER — ASPIRIN-DIPYRIDAMOLE ER 25-200 MG PO CP12: ORAL_CAPSULE | ORAL | Status: DC

## 2014-10-17 NOTE — Telephone Encounter (Signed)
[facility] 

## 2014-10-18 ENCOUNTER — Other Ambulatory Visit: Payer: Self-pay | Admitting: *Deleted

## 2014-10-18 MED ORDER — TERIPARATIDE (RECOMBINANT) 600 MCG/2.4ML ~~LOC~~ SOLN: 20.0000 ug | Freq: Every day | SUBCUTANEOUS | Status: DC

## 2014-10-18 NOTE — Telephone Encounter (Signed)
Patient Requested and faxed to pharmacy. 

## 2014-11-08 ENCOUNTER — Other Ambulatory Visit: Payer: Self-pay | Admitting: Internal Medicine

## 2014-11-09 ENCOUNTER — Ambulatory Visit (INDEPENDENT_AMBULATORY_CARE_PROVIDER_SITE_OTHER): Payer: Medicare Other | Admitting: *Deleted

## 2014-11-09 ENCOUNTER — Other Ambulatory Visit: Payer: Self-pay | Admitting: *Deleted

## 2014-11-09 DIAGNOSIS — I5022 Chronic systolic (congestive) heart failure: Secondary | ICD-10-CM | POA: Diagnosis not present

## 2014-11-09 DIAGNOSIS — I429 Cardiomyopathy, unspecified: Secondary | ICD-10-CM

## 2014-11-09 MED ORDER — LORAZEPAM 1 MG PO TABS: 1.0000 mg | ORAL_TABLET | Freq: Every evening | ORAL | Status: DC | PRN

## 2014-11-09 NOTE — Progress Notes (Signed)
Remote ICD transmission.   

## 2014-11-09 NOTE — Telephone Encounter (Signed)
[facility] Fax

## 2014-11-11 LAB — MDC_IDC_ENUM_SESS_TYPE_REMOTE
Battery Remaining Longevity: 20 mo
Battery Remaining Percentage: 31 %
Brady Statistic AP VS Percent: 1 %
Brady Statistic AS VS Percent: 1 %
Brady Statistic RA Percent Paced: 58 %
Date Time Interrogation Session: 20160309082721
HighPow Impedance: 41 Ohm
Implantable Pulse Generator Serial Number: 631860
Lead Channel Impedance Value: 310 Ohm
Lead Channel Pacing Threshold Amplitude: 0.75 V
Lead Channel Pacing Threshold Amplitude: 1.25 V
Lead Channel Pacing Threshold Amplitude: 1.25 V
Lead Channel Pacing Threshold Pulse Width: 0.5 ms
Lead Channel Pacing Threshold Pulse Width: 1 ms
Lead Channel Sensing Intrinsic Amplitude: 1.6 mV
Lead Channel Setting Pacing Amplitude: 2 V
Lead Channel Setting Pacing Amplitude: 2.5 V
Lead Channel Setting Pacing Pulse Width: 0.5 ms
Lead Channel Setting Sensing Sensitivity: 2 mV
MDC IDC MSMT BATTERY VOLTAGE: 2.84 V
MDC IDC MSMT LEADCHNL LV IMPEDANCE VALUE: 590 Ohm
MDC IDC MSMT LEADCHNL RA PACING THRESHOLD PULSEWIDTH: 0.8 ms
MDC IDC MSMT LEADCHNL RV IMPEDANCE VALUE: 310 Ohm
MDC IDC MSMT LEADCHNL RV SENSING INTR AMPL: 12 mV
MDC IDC SET LEADCHNL LV PACING AMPLITUDE: 2.5 V
MDC IDC SET LEADCHNL LV PACING PULSEWIDTH: 1 ms
MDC IDC SET ZONE DETECTION INTERVAL: 335 ms
MDC IDC STAT BRADY AP VP PERCENT: 59 %
MDC IDC STAT BRADY AS VP PERCENT: 40 %
Zone Setting Detection Interval: 300 ms

## 2014-11-14 ENCOUNTER — Encounter: Payer: Self-pay | Admitting: Internal Medicine

## 2014-11-15 ENCOUNTER — Encounter: Payer: Self-pay | Admitting: Internal Medicine

## 2014-11-17 ENCOUNTER — Encounter: Payer: Self-pay | Admitting: Cardiology

## 2014-11-18 ENCOUNTER — Encounter: Payer: Self-pay | Admitting: Internal Medicine

## 2014-11-21 ENCOUNTER — Telehealth: Payer: Self-pay

## 2014-11-21 MED ORDER — TERIPARATIDE (RECOMBINANT) 600 MCG/2.4ML ~~LOC~~ SOLN: 20.0000 ug | Freq: Every day | SUBCUTANEOUS | Status: DC

## 2014-11-21 NOTE — Telephone Encounter (Signed)
Patients wife called request order for Forteo to be sent to [facility]. Patient switched to [facility] a lil while back and Forteo got lost in the shuffle. Patient injects once dialy for bones.  Rx sent pharmacy ([facility])

## 2014-11-29 ENCOUNTER — Encounter: Payer: Self-pay | Admitting: Internal Medicine

## 2014-12-01 ENCOUNTER — Other Ambulatory Visit: Payer: Self-pay | Admitting: Internal Medicine

## 2014-12-09 ENCOUNTER — Telehealth: Payer: Self-pay

## 2014-12-09 MED ORDER — ALENDRONATE SODIUM 70 MG PO TABS: 70.0000 mg | ORAL_TABLET | ORAL | Status: DC

## 2014-12-09 NOTE — Telephone Encounter (Signed)
Called patient, received request for prior authorization on Forteo again, from [facility]. I called them, we had completed the same form in January. I was told it was denied 1/27. We did not received noticed of this. It was denied because patient had not been on any oral bisphosphonate. Called the patient to ask him if he had been on any oral medications for his bones. He had not. Per Dr. Mariea Clonts [facility] Take in morning with water at least 30 minutes before eating with 8 oz of water, do not lie down for at least 30 minutes take once a week. Has appt 12/27/14 with Dr. Mariea Clonts, keep appt and bring all bottles of pills. Can not get Forteo for now, insurance won't pay for it. Asked if he has any questions. He didn't. Fax Rx to [facility]

## 2014-12-12 ENCOUNTER — Telehealth: Payer: Self-pay

## 2014-12-12 NOTE — Telephone Encounter (Signed)
Patient called, said he was returning my call from this morning about Forteo. I hadn't called, we talked Friday about the Forteo, that [employer] won't pay for the Forteo until he try's oral medication first, that [facility]. Patient said he was at the pharmacy and picked up the [facility], I don't know how, since the insurance won't pay for it. Wife got on the line, asked her about the Forteo, he did not get Forteo just the Ativan. She wanted to go over his medication list to see when he is to take the medication. We review the list, Mr Pelc has disagree with Mrs. Whittaker about how to take the medication. Told her about the Fosmax, they had taken Fosmax years ago and the doctor told them to stop it because they had been on it so long.  I told Mrs. Lehrmann that I asked Mr. Culbertson Friday if he had ever been on any oral medication for his bones and he said no. "Well he forgot" per Mrs. Schaffert. Explained that Claudette had made a note year ago that Mr Anspach had not been on any oral medication. Mrs. Nudd said she would call the other doctors office to see if he could provide notes showing he had been on Fosmax before. Spoke with Dr. Mariea Clonts, unless we get something showing he had been on the Fosmax years ago, he will need to take it again, so we can  fill out the forms for insurance for the Forteo. Called Mrs. Springborn back to tell her.

## 2014-12-18 ENCOUNTER — Encounter (HOSPITAL_COMMUNITY): Payer: Self-pay | Admitting: Emergency Medicine

## 2014-12-18 ENCOUNTER — Emergency Department (HOSPITAL_COMMUNITY): Payer: Medicare Other

## 2014-12-18 ENCOUNTER — Inpatient Hospital Stay (HOSPITAL_COMMUNITY)
Admission: EM | Admit: 2014-12-18 | Discharge: 2014-12-23 | DRG: 291 | Disposition: A | Discharge status: Skilled Nursing Facility | Payer: Medicare Other | Attending: Internal Medicine | Admitting: Internal Medicine

## 2014-12-18 DIAGNOSIS — I248 Other forms of acute ischemic heart disease: Secondary | ICD-10-CM | POA: Diagnosis present

## 2014-12-18 DIAGNOSIS — R945 Abnormal results of liver function studies: Secondary | ICD-10-CM

## 2014-12-18 DIAGNOSIS — I255 Ischemic cardiomyopathy: Secondary | ICD-10-CM | POA: Diagnosis present

## 2014-12-18 DIAGNOSIS — F05 Delirium due to known physiological condition: Secondary | ICD-10-CM | POA: Diagnosis not present

## 2014-12-18 DIAGNOSIS — N183 Chronic kidney disease, stage 3 (moderate): Secondary | ICD-10-CM | POA: Diagnosis present

## 2014-12-18 DIAGNOSIS — R1310 Dysphagia, unspecified: Secondary | ICD-10-CM | POA: Diagnosis present

## 2014-12-18 DIAGNOSIS — I480 Paroxysmal atrial fibrillation: Secondary | ICD-10-CM | POA: Diagnosis present

## 2014-12-18 DIAGNOSIS — R791 Abnormal coagulation profile: Secondary | ICD-10-CM | POA: Diagnosis present

## 2014-12-18 DIAGNOSIS — I252 Old myocardial infarction: Secondary | ICD-10-CM

## 2014-12-18 DIAGNOSIS — Z8546 Personal history of malignant neoplasm of prostate: Secondary | ICD-10-CM | POA: Diagnosis not present

## 2014-12-18 DIAGNOSIS — Z95810 Presence of automatic (implantable) cardiac defibrillator: Secondary | ICD-10-CM | POA: Diagnosis not present

## 2014-12-18 DIAGNOSIS — Z66 Do not resuscitate: Secondary | ICD-10-CM | POA: Diagnosis present

## 2014-12-18 DIAGNOSIS — E785 Hyperlipidemia, unspecified: Secondary | ICD-10-CM | POA: Diagnosis present

## 2014-12-18 DIAGNOSIS — Z87891 Personal history of nicotine dependence: Secondary | ICD-10-CM | POA: Diagnosis not present

## 2014-12-18 DIAGNOSIS — Z951 Presence of aortocoronary bypass graft: Secondary | ICD-10-CM | POA: Diagnosis not present

## 2014-12-18 DIAGNOSIS — F0390 Unspecified dementia without behavioral disturbance: Secondary | ICD-10-CM | POA: Diagnosis present

## 2014-12-18 DIAGNOSIS — R7989 Other specified abnormal findings of blood chemistry: Secondary | ICD-10-CM | POA: Diagnosis not present

## 2014-12-18 DIAGNOSIS — N179 Acute kidney failure, unspecified: Secondary | ICD-10-CM | POA: Diagnosis present

## 2014-12-18 DIAGNOSIS — G4700 Insomnia, unspecified: Secondary | ICD-10-CM | POA: Diagnosis present

## 2014-12-18 DIAGNOSIS — I2510 Atherosclerotic heart disease of native coronary artery without angina pectoris: Secondary | ICD-10-CM | POA: Diagnosis present

## 2014-12-18 DIAGNOSIS — Z8673 Personal history of transient ischemic attack (TIA), and cerebral infarction without residual deficits: Secondary | ICD-10-CM | POA: Diagnosis not present

## 2014-12-18 DIAGNOSIS — Z888 Allergy status to other drugs, medicaments and biological substances status: Secondary | ICD-10-CM | POA: Diagnosis not present

## 2014-12-18 DIAGNOSIS — R05 Cough: Secondary | ICD-10-CM

## 2014-12-18 DIAGNOSIS — Z85828 Personal history of other malignant neoplasm of skin: Secondary | ICD-10-CM | POA: Diagnosis not present

## 2014-12-18 DIAGNOSIS — I5023 Acute on chronic systolic (congestive) heart failure: Secondary | ICD-10-CM

## 2014-12-18 DIAGNOSIS — R0602 Shortness of breath: Secondary | ICD-10-CM

## 2014-12-18 DIAGNOSIS — R410 Disorientation, unspecified: Secondary | ICD-10-CM

## 2014-12-18 DIAGNOSIS — E559 Vitamin D deficiency, unspecified: Secondary | ICD-10-CM | POA: Diagnosis present

## 2014-12-18 DIAGNOSIS — R748 Abnormal levels of other serum enzymes: Secondary | ICD-10-CM | POA: Diagnosis not present

## 2014-12-18 DIAGNOSIS — Z681 Body mass index (BMI) 19 or less, adult: Secondary | ICD-10-CM

## 2014-12-18 DIAGNOSIS — E43 Unspecified severe protein-calorie malnutrition: Secondary | ICD-10-CM | POA: Diagnosis not present

## 2014-12-18 DIAGNOSIS — I509 Heart failure, unspecified: Secondary | ICD-10-CM | POA: Diagnosis not present

## 2014-12-18 DIAGNOSIS — C61 Malignant neoplasm of prostate: Secondary | ICD-10-CM

## 2014-12-18 DIAGNOSIS — I129 Hypertensive chronic kidney disease with stage 1 through stage 4 chronic kidney disease, or unspecified chronic kidney disease: Secondary | ICD-10-CM | POA: Diagnosis present

## 2014-12-18 DIAGNOSIS — R059 Cough, unspecified: Secondary | ICD-10-CM

## 2014-12-18 DIAGNOSIS — IMO0002 Reserved for concepts with insufficient information to code with codable children: Secondary | ICD-10-CM

## 2014-12-18 DIAGNOSIS — R32 Unspecified urinary incontinence: Secondary | ICD-10-CM | POA: Diagnosis present

## 2014-12-18 DIAGNOSIS — I5022 Chronic systolic (congestive) heart failure: Secondary | ICD-10-CM | POA: Diagnosis not present

## 2014-12-18 DIAGNOSIS — M810 Age-related osteoporosis without current pathological fracture: Secondary | ICD-10-CM | POA: Diagnosis present

## 2014-12-18 LAB — I-STAT CG4 LACTIC ACID, ED: Lactic Acid, Venous: 1.72 mmol/L (ref 0.5–2.0)

## 2014-12-18 LAB — URINALYSIS, ROUTINE W REFLEX MICROSCOPIC
Bilirubin Urine: NEGATIVE
GLUCOSE, UA: NEGATIVE mg/dL
HGB URINE DIPSTICK: NEGATIVE
Ketones, ur: NEGATIVE mg/dL
Leukocytes, UA: NEGATIVE
Nitrite: NEGATIVE
Protein, ur: 100 mg/dL — AB
SPECIFIC GRAVITY, URINE: 1.027 (ref 1.005–1.030)
Urobilinogen, UA: 0.2 mg/dL (ref 0.0–1.0)
pH: 5 (ref 5.0–8.0)

## 2014-12-18 LAB — COMPREHENSIVE METABOLIC PANEL
ALK PHOS: 72 U/L (ref 39–117)
ALT: 208 U/L — ABNORMAL HIGH (ref 0–53)
AST: 236 U/L — ABNORMAL HIGH (ref 0–37)
Albumin: 3.9 g/dL (ref 3.5–5.2)
Anion gap: 13 (ref 5–15)
BILIRUBIN TOTAL: 1.8 mg/dL — AB (ref 0.3–1.2)
BUN: 49 mg/dL — AB (ref 6–23)
CO2: 19 mmol/L (ref 19–32)
Calcium: 9.2 mg/dL (ref 8.4–10.5)
Chloride: 106 mmol/L (ref 96–112)
Creatinine, Ser: 1.78 mg/dL — ABNORMAL HIGH (ref 0.50–1.35)
GFR calc Af Amer: 39 mL/min — ABNORMAL LOW (ref >90)
GFR, EST NON AFRICAN AMERICAN: 33 mL/min — AB (ref >90)
GLUCOSE: 134 mg/dL — AB (ref 70–99)
POTASSIUM: 4.4 mmol/L (ref 3.5–5.1)
Sodium: 138 mmol/L (ref 135–145)
TOTAL PROTEIN: 6.6 g/dL (ref 6.0–8.3)

## 2014-12-18 LAB — CBC WITH DIFFERENTIAL/PLATELET
Basophils Absolute: 0 10*3/uL (ref 0.0–0.1)
Basophils Relative: 0 % (ref 0–1)
EOS ABS: 0 10*3/uL (ref 0.0–0.7)
Eosinophils Relative: 0 % (ref 0–5)
HCT: 40.1 % (ref 39.0–52.0)
HEMOGLOBIN: 13.3 g/dL (ref 13.0–17.0)
Lymphocytes Relative: 11 % — ABNORMAL LOW (ref 12–46)
Lymphs Abs: 0.6 10*3/uL — ABNORMAL LOW (ref 0.7–4.0)
MCH: 32.1 pg (ref 26.0–34.0)
MCHC: 33.2 g/dL (ref 30.0–36.0)
MCV: 96.9 fL (ref 78.0–100.0)
Monocytes Absolute: 0.6 10*3/uL (ref 0.1–1.0)
Monocytes Relative: 10 % (ref 3–12)
Neutro Abs: 4.5 10*3/uL (ref 1.7–7.7)
Neutrophils Relative %: 79 % — ABNORMAL HIGH (ref 43–77)
PLATELETS: 108 10*3/uL — AB (ref 150–400)
RBC: 4.14 MIL/uL — ABNORMAL LOW (ref 4.22–5.81)
RDW: 15.6 % — AB (ref 11.5–15.5)
WBC: 5.7 10*3/uL (ref 4.0–10.5)

## 2014-12-18 LAB — BRAIN NATRIURETIC PEPTIDE: B Natriuretic Peptide: 2286.3 pg/mL — ABNORMAL HIGH (ref 0.0–100.0)

## 2014-12-18 LAB — CBG MONITORING, ED: GLUCOSE-CAPILLARY: 142 mg/dL — AB (ref 70–99)

## 2014-12-18 LAB — URINE MICROSCOPIC-ADD ON

## 2014-12-18 LAB — LACTIC ACID, PLASMA: LACTIC ACID, VENOUS: 1.7 mmol/L (ref 0.5–2.0)

## 2014-12-18 LAB — D-DIMER, QUANTITATIVE: D-Dimer, Quant: 12.3 ug/mL-FEU — ABNORMAL HIGH (ref 0.00–0.48)

## 2014-12-18 LAB — TROPONIN I: Troponin I: 0.1 ng/mL — ABNORMAL HIGH (ref <0.031)

## 2014-12-18 LAB — AMMONIA: Ammonia: 20 umol/L (ref 11–32)

## 2014-12-18 MED ORDER — SACCHAROMYCES BOULARDII 250 MG PO CAPS
250.0000 mg | ORAL_CAPSULE | Freq: Two times a day (BID) | ORAL | Status: DC
  Administered 2014-12-18 – 2014-12-23 (×10): 250 mg via ORAL
  Filled 2014-12-18 (×11): qty 1

## 2014-12-18 MED ORDER — SENNOSIDES-DOCUSATE SODIUM 8.6-50 MG PO TABS
2.0000 | ORAL_TABLET | Freq: Every day | ORAL | Status: DC
  Administered 2014-12-19 – 2014-12-23 (×5): 2 via ORAL
  Filled 2014-12-18 (×5): qty 2

## 2014-12-18 MED ORDER — FUROSEMIDE 10 MG/ML IJ SOLN
80.0000 mg | Freq: Two times a day (BID) | INTRAMUSCULAR | Status: DC
  Administered 2014-12-19: 80 mg via INTRAVENOUS
  Filled 2014-12-18 (×3): qty 8

## 2014-12-18 MED ORDER — POLYVINYL ALCOHOL 1.4 % OP SOLN
1.0000 [drp] | Freq: Every morning | OPHTHALMIC | Status: DC
Start: 2014-12-19 — End: 2014-12-23
  Administered 2014-12-19 – 2014-12-22 (×4): 1 [drp] via OPHTHALMIC
  Filled 2014-12-18: qty 15

## 2014-12-18 MED ORDER — ASPIRIN-DIPYRIDAMOLE ER 25-200 MG PO CP12
1.0000 | ORAL_CAPSULE | Freq: Every day | ORAL | Status: DC
  Administered 2014-12-19 – 2014-12-23 (×5): 1 via ORAL
  Filled 2014-12-18 (×5): qty 1

## 2014-12-18 MED ORDER — BOOST PO LIQD
237.0000 mL | Freq: Every morning | ORAL | Status: DC
  Administered 2014-12-19 – 2014-12-21 (×2): 237 mL via ORAL
  Filled 2014-12-18 (×4): qty 237

## 2014-12-18 MED ORDER — SIMVASTATIN 20 MG PO TABS
20.0000 mg | ORAL_TABLET | Freq: Every day | ORAL | Status: DC
  Administered 2014-12-18 – 2014-12-22 (×5): 20 mg via ORAL
  Filled 2014-12-18 (×6): qty 1

## 2014-12-18 MED ORDER — LORAZEPAM 1 MG PO TABS
1.0000 mg | ORAL_TABLET | Freq: Every day | ORAL | Status: DC
  Administered 2014-12-18 – 2014-12-22 (×5): 1 mg via ORAL
  Filled 2014-12-18 (×5): qty 1

## 2014-12-18 MED ORDER — ALENDRONATE SODIUM 70 MG PO TABS: 70.0000 mg | ORAL_TABLET | ORAL | Status: DC

## 2014-12-18 MED ORDER — HEPARIN SODIUM (PORCINE) 5000 UNIT/ML IJ SOLN
5000.0000 [IU] | Freq: Three times a day (TID) | INTRAMUSCULAR | Status: DC
  Administered 2014-12-18 – 2014-12-23 (×14): 5000 [IU] via SUBCUTANEOUS
  Filled 2014-12-18 (×15): qty 1

## 2014-12-18 MED ORDER — FUROSEMIDE 10 MG/ML IJ SOLN
40.0000 mg | Freq: Once | INTRAMUSCULAR | Status: AC
  Administered 2014-12-18: 40 mg via INTRAVENOUS
  Filled 2014-12-18: qty 4

## 2014-12-18 MED ORDER — CARVEDILOL 3.125 MG PO TABS
3.1250 mg | ORAL_TABLET | Freq: Two times a day (BID) | ORAL | Status: DC
  Administered 2014-12-18 – 2014-12-20 (×4): 3.125 mg via ORAL
  Filled 2014-12-18 (×7): qty 1

## 2014-12-18 MED ORDER — TRIAMCINOLONE ACETONIDE 0.025 % EX CREA: 1.0000 "application " | TOPICAL_CREAM | Freq: Two times a day (BID) | CUTANEOUS | Status: DC | PRN

## 2014-12-18 MED ORDER — HALOPERIDOL LACTATE 5 MG/ML IJ SOLN
2.5000 mg | Freq: Once | INTRAMUSCULAR | Status: AC
  Administered 2014-12-18: 2.5 mg via INTRAVENOUS
  Filled 2014-12-18: qty 1

## 2014-12-18 NOTE — ED Provider Notes (Signed)
CSN: [id number]     Arrival date & time 12/18/14  1353 History   First MD Initiated Contact with Patient 12/18/14 1355     Chief Complaint  Patient presents with  . Shortness of Breath     (Consider location/radiation/quality/duration/timing/severity/associated sxs/prior Treatment) Patient is a 81 y.o. male presenting with shortness of breath. The history is provided by the patient.  Shortness of Breath Severity:  Moderate Onset quality:  Gradual Timing:  Constant Progression:  Worsening Chronicity:  Recurrent Context comment:  Recently diagnosed with pneumonia, started on Augmentin 3 days ago Relieved by:  Nothing Worsened by:  Nothing tried Associated symptoms: cough   Associated symptoms: no abdominal pain, no chest pain, no fever and no vomiting   Associated symptoms comment:  Confusion - had difficulty remember who his daughter was.   Past Medical History  Diagnosis Date  . Complete AV block   . Chronic systolic heart failure   . Cardiomyopathy, ischemic   . Automatic implantable cardiac defibrillator in situ   . CAD (coronary artery disease) of artery bypass graft   . Prostate cancer     S/P radiation rx  . Osteoporosis     A/P Vertebral compression fx's  . Hypertension   . Dyslipidemia   . TIA (transient ischemic attack)     H/o  . Lumbago 10/05/2012  . Cervicalgia 04/06/2012  . 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 Unspecified vitamin D deficiency 10/07/2011  . Unspecified pruritic disorder 10/07/2011  . Rash and other nonspecific skin eruption 10/07/2011  . Basal cell carcinoma of skin of lower limb, including hip 04/08/2011  . Squamous cell carcinoma of skin of lower limb, including hip 04/08/2011  . Unspecified sinusitis (chronic) 10/08/2010  . Impotence of organic origin 10/08/2010  . Seborrhea 04/09/2010  . Insomnia, unspecified 04/09/2010  . Altered mental status 01/12/2010  . Full incontinence of feces 10/09/2009  . Atrial fibrillation 05/15/2006  .  Unspecified urinary incontinence 05/15/2006  . Sebaceous cyst 10/09/2005  . Pathologic fracture of vertebrae 05/16/1991  . Fracture of C2 vertebra, closed     s/p cervical fusion 05/2013  . Osteoporosis with fracture 04/05/2013    History of vertebal fracture.    Past Surgical History  Procedure Laterality Date  . Coronary artery bypass graft    . Hernia repair    . Kyphosis surgery    . Biv-icd  11/21/2010    [employer] ICD Model#CD3231-40 [phone]  . Cardiac defibrillator placement    . Cyst excision  10/2012    back Dr. Wilhemina Bonito  . Cyst removal neck  12/2012    basel cell (right) Dr. Sarajane Jews  . Colonoscopy  2006    polyps benign  . Dexa  April 2010  . Posterior cervical fusion/foraminotomy N/A 05/21/2013    Procedure: Cervical One, Cervical Two, Cervical Three Posterior cervical fusion with lateral mass fixation;  Surgeon: Charlie Pitter, MD;  Location: [facility];  Service: Neurosurgery;  Laterality: N/A;  POSTERIOR CERVICAL FUSION/FORAMINOTOMY LEVEL 2   Family History  Problem Relation Age of Onset  . Coronary artery disease Neg Hx   . Heart disease Mother    History  Substance Use Topics  . Smoking status: Former Smoker    Quit date: 01/13/1962  . Smokeless tobacco: Never Used  . Alcohol Use: 1.2 oz/week    1 Glasses of wine, 1 Cans of beer per week    Review of Systems  Constitutional: Negative for fever  and chills.  Respiratory: Positive for cough and shortness of breath.   Cardiovascular: Negative for chest pain and leg swelling.  Gastrointestinal: Negative for vomiting and abdominal pain.  All other systems reviewed and are negative.     Allergies  Warfarin sodium and Amlodipine besylate  Home Medications   Prior to Admission medications   Medication Sig Start Date End Date Taking? Authorizing Provider  amoxicillin-clavulanate (AUGMENTIN) 875-125 MG per tablet Take 1 tablet by mouth 2 (two) times daily.   Yes Historical Provider, MD  Calcium  Carbonate-Vitamin D (CALCIUM 600 + D PO) Take 1 tablet by mouth daily.    Yes Historical Provider, MD  carvedilol (COREG) 25 MG tablet One twice daily to strengthen the heart Patient taking differently: Take one tablet by mouth twice daily to strengthen the hear 12/13/13  Yes Tiffany L Reed, DO  dipyridamole-aspirin (AGGRENOX) 200-25 MG per 12 hr capsule Take one tablet twice daily to reduce stroke 10/17/14  Yes Tiffany L Reed, DO  lactose free nutrition (BOOST) LIQD Take 237 mLs by mouth every morning.   Yes Historical Provider, MD  lisinopril (PRINIVIL,ZESTRIL) 20 MG tablet Take one tablet by mouth once daily to control blood pressure 05/26/14  Yes Estill Dooms, MD  LORazepam (ATIVAN) 1 MG tablet Take 1 tablet (1 mg total) by mouth at bedtime as needed (for sleep). Patient taking differently: Take 1 mg by mouth at bedtime.  11/09/14  Yes Estill Dooms, MD  Polyethyl Glycol-Propyl Glycol 0.4-0.3 % GEL Apply 1 drop to eye every morning.   Yes Historical Provider, MD  polyethylene glycol (MIRALAX / GLYCOLAX) packet Take 17 g by mouth daily as needed for moderate constipation.   Yes Historical Provider, MD  saccharomyces boulardii (FLORASTOR) 250 MG capsule Take 250 mg by mouth 2 (two) times daily.   Yes Historical Provider, MD  senna-docusate (SENOKOT-S) 8.6-50 MG per tablet Take 2 tablets by mouth daily. 05/24/13  Yes Claudette T Levie Heritage, NP  simvastatin (ZOCOR) 20 MG tablet TAKE 1 TABLET BY MOUTH AT BEDTIME FOR CHOLESTEROL 12/01/14  Yes Tiffany L Reed, DO  tolterodine (DETROL LA) 4 MG 24 hr capsule Take 4 mg by mouth every morning.   Yes Historical Provider, MD  triamterene-hydrochlorothiazide (DYAZIDE) 37.5-25 MG per capsule Take 1 capsule by mouth every morning.   Yes Historical Provider, MD  alendronate (FOSAMAX) 70 MG tablet Take 1 tablet (70 mg total) by mouth once a week. Take with a full glass of water on an empty stomach. Take at least 30 minutes before eating, do not lie down for 30 minutes after  taking. Patient not taking: Reported on 12/18/2014 12/09/14   Tiffany L Reed, DO  AMBULATORY NON FORMULARY MEDICATION Forteo Needles and Syringes Use for subcutaneous injections of Forteo daily 09/19/14   Tiffany L Reed, DO  cholecalciferol (VITAMIN D) 1000 UNITS tablet Take 1,000 Units by mouth daily.      Historical Provider, MD  furosemide (LASIX) 20 MG tablet TAKE 1 TABLET (20 MG TOTAL) BY MOUTH DAILY. Patient not taking: Reported on 12/18/2014 11/09/14   Tiffany L Reed, DO  triamcinolone (KENALOG) 0.025 % cream Apply 1 application topically 2 (two) times daily as needed (rash).    Historical Provider, MD   BP 143/89 mmHg  Pulse 77  Temp(Src) 97.2 F (36.2 C) (Oral)  Resp 18  SpO2 99% Physical Exam  Constitutional: He is oriented to person, place, and time. He appears well-developed and well-nourished. No distress.  HENT:  Head: Normocephalic  and atraumatic.  Mouth/Throat: No oropharyngeal exudate.  Eyes: EOM are normal. Pupils are equal, round, and reactive to light.  Neck: Normal range of motion. Neck supple.  Cardiovascular: Normal rate and regular rhythm.  Exam reveals no friction rub.   No murmur heard. Pulmonary/Chest: Effort normal and breath sounds normal. No respiratory distress. He has no decreased breath sounds. He has no wheezes. He has no rhonchi. He has no rales.  Abdominal: He exhibits no distension. There is no tenderness. There is no rebound.  Musculoskeletal: Normal range of motion. He exhibits no edema.  Neurological: He is alert and oriented to person, place, and time.  Skin: He is not diaphoretic.  Nursing note and vitals reviewed.   ED Course  Procedures (including critical care time) Labs Review Labs Reviewed  COMPREHENSIVE METABOLIC PANEL - Abnormal; Notable for the following:    Glucose, Bld 134 (*)    BUN 49 (*)    Creatinine, Ser 1.78 (*)    AST 236 (*)    ALT 208 (*)    Total Bilirubin 1.8 (*)    GFR calc non Af Amer 33 (*)    GFR calc Af Amer 39  (*)    All other components within normal limits  CBC WITH DIFFERENTIAL/PLATELET - Abnormal; Notable for the following:    RBC 4.14 (*)    RDW 15.6 (*)    Platelets 108 (*)    Neutrophils Relative % 79 (*)    Lymphocytes Relative 11 (*)    Lymphs Abs 0.6 (*)    All other components within normal limits  URINALYSIS, ROUTINE W REFLEX MICROSCOPIC  BRAIN NATRIURETIC PEPTIDE  CBG MONITORING, ED  I-STAT TROPOININ, ED  I-STAT CG4 LACTIC ACID, ED    Imaging Review Dg Chest 2 View  12/18/2014   CLINICAL DATA:  Cough, SOB, AMS, PNA 12/16/14 HX: HTN, CABG, Defibrillator, Open Heart Surgery  EXAM: CHEST  2 VIEW  COMPARISON:  05/13/2014.  FINDINGS: Changes from CABG surgery are stable. Cardiac silhouette is mildly enlarged. Aorta is mildly uncoiled. No mediastinal or hilar masses. Left anterior chest wall AICD is stable and well positioned.  There is central vascular congestion. There are small bilateral pleural effusions. Mild lower lung zone interstitial thickening is noted.  No pneumothorax.  Bony thorax is demineralized. There are compression fractures of multiple vertebrae, 1 treated with prior kyphoplasty, stable from the prior study.  IMPRESSION: Cardiomegaly with lower lung zone interstitial thickening with central vascular congestion and small pleural effusions. Findings are consistent with mild congestive heart failure.   Electronically Signed   By: Lajean Manes M.D.   On: 12/18/2014 15:40     EKG Interpretation   Date/Time:  Sunday December 18 2014 14:02:59 EDT Ventricular Rate:  68 PR Interval:  115 QRS Duration: 124 QT Interval:  347 QTC Calculation: 369 R Axis:   -78 Text Interpretation:  Atrial-sensed ventricular-paced rhythm No further  analysis attempted due to paced rhythm No significant change since last  tracing Confirmed by Mingo Amber  MD, [place] ([id number]) on 12/18/2014 4:00:14 PM      MDM   Final diagnoses:  CHF exacerbation    36M presents with SOB and confusion. Diagnosed  with pneumonia 2 days ago, here with increased confusion. Mild hypoxia with EMS  - at 88% with good correction on 2 L [place]. Here CXR ordered, no pneumonia noted, but does look concerning for CHF. BNP ordered. No white count. Labs do show some renal failure. Lasix given for his  CHf exacerbation. Reportedly hadn't been taking it at home. Will plan on admission.    Evelina Bucy, MD 12/18/14 [phone]

## 2014-12-18 NOTE — H&P (Addendum)
History and Physical  Jacob Rosales NIO:[id number] DOB: 02-09-1928 DOA: 12/18/2014  Referring physician: EDP PCP: Hollace Kinnier, DO   Chief Complaint:  Sob, confusion, wakeness  HPI: Jacob Rosales is a 81 y.o. male  Who is currently confused, only oriented to person, not able to provide reliable history, HPI obtained from talking to EDP/patient's wife and chart review.  Patient has cough and possible sob per chart,  wife states this history may not be reliable due to patient's memory issue. Wife reported that she noticed patient has progressive memory problems in the last several months,  wife did report that patient has progressive weakness over the last week, she found patient was on his knees Friday which prompted evaluation at [facility]. patient had a mobile xray done and was treated with abx at [facility] for the last three days, however patient did not get better, with increasing confusion, he was sent to [facility] ED. EMS reported patient 02sats 88% on room air, this was corrected with 2liter oxygen.   ED course: cxr consistent with chf, s/p lasix 40mg  x1, labs showed ARF, elevation of lft, EKG paced rhythm, bnp elevated from baseline, hospitalist called for admission.   Review of Systems:  Detail per HPI, Review of systems are otherwise negative  Past Medical History  Diagnosis Date  . Complete AV block   . Chronic systolic heart failure   . Cardiomyopathy, ischemic   . Automatic implantable cardiac defibrillator in situ   . CAD (coronary artery disease) of artery bypass graft   . Prostate cancer     S/P radiation rx  . Osteoporosis     A/P Vertebral compression fx's  . Hypertension   . Dyslipidemia   . TIA (transient ischemic attack)     H/o  . Lumbago 10/05/2012  . Cervicalgia 04/06/2012  . 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 Unspecified vitamin D deficiency 10/07/2011  . Unspecified pruritic disorder 10/07/2011  . Rash and other  nonspecific skin eruption 10/07/2011  . Basal cell carcinoma of skin of lower limb, including hip 04/08/2011  . Squamous cell carcinoma of skin of lower limb, including hip 04/08/2011  . Unspecified sinusitis (chronic) 10/08/2010  . Impotence of organic origin 10/08/2010  . Seborrhea 04/09/2010  . Insomnia, unspecified 04/09/2010  . Altered mental status 01/12/2010  . Full incontinence of feces 10/09/2009  . Atrial fibrillation 05/15/2006  . Unspecified urinary incontinence 05/15/2006  . Sebaceous cyst 10/09/2005  . Pathologic fracture of vertebrae 05/16/1991  . Fracture of C2 vertebra, closed     s/p cervical fusion 05/2013  . Osteoporosis with fracture 04/05/2013    History of vertebal fracture.   Marland Kitchen AICD (automatic cardioverter/defibrillator) present    Past Surgical History  Procedure Laterality Date  . Coronary artery bypass graft    . Hernia repair    . Kyphosis surgery    . Biv-icd  11/21/2010    [employer] ICD Model#[id number] [phone]  . Cardiac defibrillator placement    . Cyst excision  10/2012    back Dr. Wilhemina Bonito  . Cyst removal neck  12/2012    basel cell (right) Dr. Sarajane Jews  . Colonoscopy  2006    polyps benign  . Dexa  April 2010  . Posterior cervical fusion/foraminotomy N/A 05/21/2013    Procedure: Cervical One, Cervical Two, Cervical Three Posterior cervical fusion with lateral mass fixation;  Surgeon: Charlie Pitter, MD;  Location: [place];  Service: Neurosurgery;  Laterality: N/A;  POSTERIOR CERVICAL FUSION/FORAMINOTOMY LEVEL 2   Social History:  reports that he quit smoking about 52 years ago. He has never used smokeless tobacco. He reports that he drinks about 1.2 oz of alcohol per week. He reports that he does not use illicit drugs. Patient lives at independent living at [place] & is able to participate in activities of daily living with a cane during day time and a walker at night time.  Allergies  Allergen Reactions  . Warfarin Sodium Other (See Comments)     REACTION: Stomach bleeding  . Amlodipine Besylate Swelling    Family History  Problem Relation Age of Onset  . Coronary artery disease Neg Hx   . Heart disease Mother       Prior to Admission medications   Medication Sig Start Date End Date Taking? Authorizing Provider  amoxicillin-clavulanate (AUGMENTIN) 875-125 MG per tablet Take 1 tablet by mouth 2 (two) times daily.   Yes Historical Provider, MD  Calcium Carbonate-Vitamin D (CALCIUM 600 + D PO) Take 1 tablet by mouth daily.    Yes Historical Provider, MD  carvedilol (COREG) 25 MG tablet One twice daily to strengthen the heart Patient taking differently: Take one tablet by mouth twice daily to strengthen the hear 12/13/13  Yes Tiffany L Reed, DO  dipyridamole-aspirin (AGGRENOX) 200-25 MG per 12 hr capsule Take one tablet twice daily to reduce stroke 10/17/14  Yes Tiffany L Reed, DO  lactose free nutrition (BOOST) LIQD Take 237 mLs by mouth every morning.   Yes Historical Provider, MD  lisinopril (PRINIVIL,ZESTRIL) 20 MG tablet Take one tablet by mouth once daily to control blood pressure 05/26/14  Yes Estill Dooms, MD  LORazepam (ATIVAN) 1 MG tablet Take 1 tablet (1 mg total) by mouth at bedtime as needed (for sleep). Patient taking differently: Take 1 mg by mouth at bedtime.  11/09/14  Yes Estill Dooms, MD  Polyethyl Glycol-Propyl Glycol 0.4-0.3 % GEL Apply 1 drop to eye every morning.   Yes Historical Provider, MD  polyethylene glycol (MIRALAX / GLYCOLAX) packet Take 17 g by mouth daily as needed for moderate constipation.   Yes Historical Provider, MD  saccharomyces boulardii (FLORASTOR) 250 MG capsule Take 250 mg by mouth 2 (two) times daily.   Yes Historical Provider, MD  senna-docusate (SENOKOT-S) 8.6-50 MG per tablet Take 2 tablets by mouth daily. 05/24/13  Yes Claudette T Levie Heritage, NP  simvastatin (ZOCOR) 20 MG tablet TAKE 1 TABLET BY MOUTH AT BEDTIME FOR CHOLESTEROL 12/01/14  Yes Tiffany L Reed, DO  tolterodine (DETROL LA) 4 MG 24 hr  capsule Take 4 mg by mouth every morning.   Yes Historical Provider, MD  triamterene-hydrochlorothiazide (DYAZIDE) 37.5-25 MG per capsule Take 1 capsule by mouth every morning.   Yes Historical Provider, MD  alendronate (FOSAMAX) 70 MG tablet Take 1 tablet (70 mg total) by mouth once a week. Take with a full glass of water on an empty stomach. Take at least 30 minutes before eating, do not lie down for 30 minutes after taking. Patient not taking: Reported on 12/18/2014 12/09/14   Tiffany L Reed, DO  AMBULATORY NON FORMULARY MEDICATION Forteo Needles and Syringes Use for subcutaneous injections of Forteo daily 09/19/14   Tiffany L Reed, DO  cholecalciferol (VITAMIN D) 1000 UNITS tablet Take 1,000 Units by mouth daily.      Historical Provider, MD  furosemide (LASIX) 20 MG tablet TAKE 1 TABLET (20 MG TOTAL) BY MOUTH DAILY. Patient not taking:  Reported on 12/18/2014 11/09/14   Tiffany L Reed, DO  triamcinolone (KENALOG) 0.025 % cream Apply 1 application topically 2 (two) times daily as needed (rash).    Historical Provider, MD    Physical Exam: BP 153/93 mmHg  Pulse 72  Temp(Src) 97.5 F (36.4 C) (Oral)  Resp 16  Ht 6\' 2"  (1.88 m)  Wt 70.126 kg (154 lb 9.6 oz)  BMI 19.84 kg/m2  SpO2 100%  General:  Confused, only oriented to person, no cough noticed during encounter Eyes: PERRL ENT: unremarkable Neck: supple, no JVD Cardiovascular: paced rhythm Respiratory: mild crackles at bases, no wheezing, no rhonchi Abdomen: soft/ND/ND, positive bowel sounds Skin: no rash Musculoskeletal:  No edema Psychiatric: calm/cooperative Neurologic: no focal deficit, confused, oriented to person only, poor immediate recall.          Labs on Admission:  Basic Metabolic Panel:  Recent Labs Lab 12/18/14 1411  NA 138  K 4.4  CL 106  CO2 19  GLUCOSE 134*  BUN 49*  CREATININE 1.78*  CALCIUM 9.2   Liver Function Tests:  Recent Labs Lab 12/18/14 1411  AST 236*  ALT 208*  ALKPHOS 72  BILITOT 1.8*    PROT 6.6  ALBUMIN 3.9   No results for input(s): LIPASE, AMYLASE in the last 168 hours. No results for input(s): AMMONIA in the last 168 hours. CBC:  Recent Labs Lab 12/18/14 1411  WBC 5.7  NEUTROABS 4.5  HGB 13.3  HCT 40.1  MCV 96.9  PLT 108*   Cardiac Enzymes: No results for input(s): CKTOTAL, CKMB, CKMBINDEX, TROPONINI in the last 168 hours.  BNP (last 3 results)  Recent Labs  12/18/14 1411  BNP 2286.3*    ProBNP (last 3 results)  Recent Labs  04/27/14 1539 06/16/14 1440  PROBNP 1024.0* 982.0*    CBG:  Recent Labs Lab 12/18/14 1425  GLUCAP 142*    Radiological Exams on Admission: Dg Chest 2 View  12/18/2014   CLINICAL DATA:  Cough, SOB, AMS, PNA 12/16/14 HX: HTN, CABG, Defibrillator, Open Heart Surgery  EXAM: CHEST  2 VIEW  COMPARISON:  05/13/2014.  FINDINGS: Changes from CABG surgery are stable. Cardiac silhouette is mildly enlarged. Aorta is mildly uncoiled. No mediastinal or hilar masses. Left anterior chest wall AICD is stable and well positioned.  There is central vascular congestion. There are small bilateral pleural effusions. Mild lower lung zone interstitial thickening is noted.  No pneumothorax.  Bony thorax is demineralized. There are compression fractures of multiple vertebrae, 1 treated with prior kyphoplasty, stable from the prior study.  IMPRESSION: Cardiomegaly with lower lung zone interstitial thickening with central vascular congestion and small pleural effusions. Findings are consistent with mild congestive heart failure.   Electronically Signed   By: Lajean Manes M.D.   On: 12/18/2014 15:40    EKG: Independently reviewed. No acute st/t changes, paced rhythm  Assessment/Plan Present on Admission:  . SOB (shortness of breath)   Hypoxia: from chf exacerbation? Cxr/bnp consistent with chf, no edema on physical exam, denies chest pain, EKG no acute ST/T changes, will trend cardiac enzymes, echo ordered, will get ddimer, if elevated , will  proceed  PE workup, howerver, patient is not a good candidate for anticoagulation.not able to get cta due arf. No overt sign of infection, no fever, no leukocytosis, will hold off abx. Lasix 80bid for now.  Confusion, will get Ct head, likely metabolic in the setting of acute illness, will check ammonia/b12/folate/tsh.  ARF: ua on infection, will get  renal US to r/o obstruction, ARF possible for CHF exacerbation.hold acei.  LFT elevation: likely from congestion, will check liver US and hepatitis panel, avoid hepatic toxin.  HTN: continue coreg, hold acei  H/o afib/avblock s/p pacemaker:  Allergic to coumadin, not a good candidate for anticoagulation, has been on aggrenox which is continued.  DVT prophylaxis with heparin subQ.   Consultants: none  Code Status:  DNR confirmed with wife  Family Communication:  Patient and wife  Disposition Plan: admit to inpatient tele  Time spent: >24mins  Anetta Olvera MD, PhD [facility] Pager [phone] If 7PM-7AM, please contact night-coverage at [email or web address], password [facility]

## 2014-12-18 NOTE — Progress Notes (Addendum)
12/18/14 Call placed to Emergency Room to get report on patient , they are unable to give report busy at this time, Jinny Blossom returned call with report on patient at !53, Dx of CHF/SOB, Lives at [facility] with wife, currently on 2 L/02 [place],IV site 4/17 Left Forearm NSL,, Lung sounds Rhonchi.

## 2014-12-18 NOTE — ED Notes (Signed)
Pt reports he become more short of breath than usual this morning, was recently diagnosed with pneumonia. Facility staff also reports increased confusion. Rhonchi heard all lung fields. Respirations unlabored. Pt states he feels weak all over. He is alert and answering questions appropriately at the time.

## 2014-12-18 NOTE — Progress Notes (Signed)
Patient confused and continues to try to get out of bed. Bed alarm is own. Patient can not be reasoned with. MD notified and haldol ordered and given. Patient is sob of breath with exertion. Toileting offered and needs attended to. Will continue to monitor and notify MD as needed. Scheduled Ativan will be given at a delayed time since haldol was just given.

## 2014-12-18 NOTE — ED Notes (Signed)
Pt from [facility] with c/o worsening SOB starting approx 3 hours ago followed by increase confusion from baseline.  Pt was dx with pneumonia 3 days ago, taking augmenton.  On EMS arrival pt SpO2 89% RA, placed on 2 L increased to 93%, increase to 4 L and 98% SpO2.  Pt lungs shallow and clear per EMS.  Pt in NAD, alert.

## 2014-12-19 ENCOUNTER — Inpatient Hospital Stay (HOSPITAL_COMMUNITY): Payer: Medicare Other

## 2014-12-19 DIAGNOSIS — R627 Adult failure to thrive: Secondary | ICD-10-CM

## 2014-12-19 DIAGNOSIS — I509 Heart failure, unspecified: Secondary | ICD-10-CM

## 2014-12-19 DIAGNOSIS — I5022 Chronic systolic (congestive) heart failure: Secondary | ICD-10-CM

## 2014-12-19 DIAGNOSIS — R748 Abnormal levels of other serum enzymes: Secondary | ICD-10-CM

## 2014-12-19 LAB — HEPATITIS PANEL, ACUTE
HCV AB: NEGATIVE
HEP B S AG: NEGATIVE
Hep A IgM: NONREACTIVE
Hep B C IgM: NONREACTIVE

## 2014-12-19 LAB — COMPREHENSIVE METABOLIC PANEL
ALK PHOS: 64 U/L (ref 39–117)
ALT: 220 U/L — ABNORMAL HIGH (ref 0–53)
ALT: 257 U/L — AB (ref 0–53)
ANION GAP: 12 (ref 5–15)
AST: 200 U/L — ABNORMAL HIGH (ref 0–37)
AST: 199 U/L — ABNORMAL HIGH (ref 0–37)
Albumin: 3.9 g/dL (ref 3.5–5.2)
Albumin: 3.7 g/dL (ref 3.5–5.2)
Alkaline Phosphatase: 65 U/L (ref 39–117)
Anion gap: 12 (ref 5–15)
BILIRUBIN TOTAL: 2.1 mg/dL — AB (ref 0.3–1.2)
BUN: 48 mg/dL — AB (ref 6–23)
BUN: 47 mg/dL — ABNORMAL HIGH (ref 6–23)
CHLORIDE: 105 mmol/L (ref 96–112)
CO2: 23 mmol/L (ref 19–32)
CO2: 26 mmol/L (ref 19–32)
CREATININE: 1.8 mg/dL — AB (ref 0.50–1.35)
Calcium: 9.2 mg/dL (ref 8.4–10.5)
Calcium: 8.9 mg/dL (ref 8.4–10.5)
Chloride: 106 mmol/L (ref 96–112)
Creatinine, Ser: 1.86 mg/dL — ABNORMAL HIGH (ref 0.50–1.35)
GFR calc Af Amer: 38 mL/min — ABNORMAL LOW (ref >90)
GFR calc non Af Amer: 33 mL/min — ABNORMAL LOW (ref >90)
GFR, EST AFRICAN AMERICAN: 37 mL/min — AB (ref >90)
GFR, EST NON AFRICAN AMERICAN: 32 mL/min — AB (ref >90)
GLUCOSE: 117 mg/dL — AB (ref 70–99)
GLUCOSE: 88 mg/dL (ref 70–99)
POTASSIUM: 4.1 mmol/L (ref 3.5–5.1)
Potassium: 3.6 mmol/L (ref 3.5–5.1)
SODIUM: 143 mmol/L (ref 135–145)
Sodium: 141 mmol/L (ref 135–145)
TOTAL PROTEIN: 6.7 g/dL (ref 6.0–8.3)
Total Bilirubin: 1.9 mg/dL — ABNORMAL HIGH (ref 0.3–1.2)
Total Protein: 6.5 g/dL (ref 6.0–8.3)

## 2014-12-19 LAB — MAGNESIUM: Magnesium: 2.2 mg/dL (ref 1.5–2.5)

## 2014-12-19 LAB — FOLATE RBC
Folate, Hemolysate: >620 ng/mL
HEMATOCRIT: 42 % (ref 37.5–51.0)

## 2014-12-19 LAB — BRAIN NATRIURETIC PEPTIDE: B NATRIURETIC PEPTIDE 5: 3590.3 pg/mL — AB (ref 0.0–100.0)

## 2014-12-19 LAB — TROPONIN I
Troponin I: 0.11 ng/mL — ABNORMAL HIGH (ref <0.031)
Troponin I: 0.09 ng/mL — ABNORMAL HIGH (ref <0.031)

## 2014-12-19 LAB — VITAMIN B12: Vitamin B-12: 1504 pg/mL — ABNORMAL HIGH (ref 211–911)

## 2014-12-19 MED ORDER — CETYLPYRIDINIUM CHLORIDE 0.05 % MT LIQD: 7.0000 mL | Freq: Two times a day (BID) | OROMUCOSAL | Status: DC

## 2014-12-19 MED ORDER — CETYLPYRIDINIUM CHLORIDE 0.05 % MT LIQD
7.0000 mL | Freq: Two times a day (BID) | OROMUCOSAL | Status: DC
  Administered 2014-12-19 – 2014-12-22 (×8): 7 mL via OROMUCOSAL

## 2014-12-19 MED ORDER — TECHNETIUM TO 99M ALBUMIN AGGREGATED
6.0000 | Freq: Once | INTRAVENOUS | Status: AC | PRN
  Administered 2014-12-19: 6 via INTRAVENOUS

## 2014-12-19 MED ORDER — TECHNETIUM TC 99M DIETHYLENETRIAME-PENTAACETIC ACID: 40.0000 | Freq: Once | INTRAVENOUS | Status: AC | PRN

## 2014-12-19 NOTE — Progress Notes (Signed)
PROGRESS NOTE  Jacob Rosales IAX:[id number] DOB: July 05, 1928 DOA: 12/18/2014 PCP: Hollace Kinnier, DO  HPI/Recap of past 24 hours:  Patient is not a reliable historian, reported cough, but sitter stats no cough this am, still confused, only oriented to self during exam, however sitter reported patient knew he was at [place] earlier this am. VQ scan and CT head pending  Assessment/Plan: Active Problems:   CHF (congestive heart failure)   SOB (shortness of breath)  Hypoxia: from chf exacerbation? Cxr/bnp consistent with chf, no edema on physical exam, denies chest pain, EKG no acute ST/T changes, will trend cardiac enzymes, echo ordered, Lasix 80bid initially, cards input appreciated, currently euvolemic, with worsening of cr, will hold off on lasix.   No overt sign of infection, no fever, no leukocytosis,  Hold off abx for now. Wean oxygen, ambulate patient in am.   Elevation of  Ddimer,V/Q scan pending, howerver, patient is not a good candidate for anticoagulation due to frailty/progressive confusion and risk of fall. This has discussed with wife today. Lower extremity venous doppler pending.  Confusion, Ct head pending,  likely metabolic in the setting of acute illness or progress on chronic dementia, will check ammonia/b12/folate/tsh unremarkable.  ARF: ua on infection, renal US to r/o obstruction, ARF possible for CHF exacerbation.hold acei.  LFT elevation: likely from congestion,  liver US diffuse gallbladder wall thickening not tneder, may be related to elevated right heart pressure,  negativehepatitis panel, avoid hepatic toxin.  HTN: continue coreg, hold acei  H/o afib/avblock s/p pacemaker: Allergic to coumadin, not a good candidate for anticoagulation, has been on aggrenox which is continued.  DVT prophylaxis with heparin subQ.   Consultants: none  Code Status: DNR confirmed with wife  Family Communication: Patient and wife  Disposition Plan: inpatient  tele   Antibiotics:  VQ scan/ct head/echo   Objective: BP 174/93 mmHg  Pulse 80  Temp(Src) 97.9 F (36.6 C) (Oral)  Resp 21  Ht 6\' 2"  (1.88 m)  Wt 67.268 kg (148 lb 4.8 oz)  BMI 19.03 kg/m2  SpO2 96%  Intake/Output Summary (Last 24 hours) at 12/19/14 1223 Last data filed at 12/19/14 1024  Gross per 24 hour  Intake    480 ml  Output   2325 ml  Net  -1845 ml   Filed Weights   12/18/14 1803 12/19/14 0449  Weight: 70.126 kg (154 lb 9.6 oz) 67.268 kg (148 lb 4.8 oz)    Exam:   General:  NAD  Cardiovascular: RRR  Respiratory: CTABL  Abdomen: Soft/ND/NT, positive BS  Musculoskeletal: No Edema  Neuro: calm but only oriented to person, does following commend. Poor short term memory.  Data Reviewed: Basic Metabolic Panel:  Recent Labs Lab 12/18/14 1411 12/19/14 0125  NA 138 141  K 4.4 4.1  CL 106 106  CO2 19 23  GLUCOSE 134* 117*  BUN 49* 48*  CREATININE 1.78* 1.86*  CALCIUM 9.2 9.2  MG  --  2.2   Liver Function Tests:  Recent Labs Lab 12/18/14 1411 12/19/14 0125  AST 236* 200*  ALT 208* 220*  ALKPHOS 72 64  BILITOT 1.8* 1.9*  PROT 6.6 6.7  ALBUMIN 3.9 3.9   No results for input(s): LIPASE, AMYLASE in the last 168 hours.  Recent Labs Lab 12/18/14 2042  AMMONIA 20   CBC:  Recent Labs Lab 12/18/14 1411  WBC 5.7  NEUTROABS 4.5  HGB 13.3  HCT 40.1  MCV 96.9  PLT 108*   Cardiac Enzymes:  Recent Labs Lab 12/18/14 2042 12/19/14 0125 12/19/14 0755  TROPONINI 0.10* 0.11* 0.09*   BNP (last 3 results)  Recent Labs  12/18/14 1411 12/19/14 0125  BNP 2286.3* 3590.3*    ProBNP (last 3 results)  Recent Labs  04/27/14 1539 06/16/14 1440  PROBNP 1024.0* 982.0*    CBG:  Recent Labs Lab 12/18/14 1425  GLUCAP 142*    No results found for this or any previous visit (from the past 240 hour(s)).   Studies: Dg Chest 2 View  12/18/2014   CLINICAL DATA:  Cough, SOB, AMS, PNA 12/16/14 HX: HTN, CABG, Defibrillator, Open  Heart Surgery  EXAM: CHEST  2 VIEW  COMPARISON:  05/13/2014.  FINDINGS: Changes from CABG surgery are stable. Cardiac silhouette is mildly enlarged. Aorta is mildly uncoiled. No mediastinal or hilar masses. Left anterior chest wall AICD is stable and well positioned.  There is central vascular congestion. There are small bilateral pleural effusions. Mild lower lung zone interstitial thickening is noted.  No pneumothorax.  Bony thorax is demineralized. There are compression fractures of multiple vertebrae, 1 treated with prior kyphoplasty, stable from the prior study.  IMPRESSION: Cardiomegaly with lower lung zone interstitial thickening with central vascular congestion and small pleural effusions. Findings are consistent with mild congestive heart failure.   Electronically Signed   By: Lajean Manes M.D.   On: 12/18/2014 15:40    Scheduled Meds: . antiseptic oral rinse  7 mL Mouth Rinse BID  . carvedilol  3.125 mg Oral BID WC  . dipyridamole-aspirin  1 capsule Oral Daily  . furosemide  80 mg Intravenous BID  . heparin subcutaneous  5,000 Units Subcutaneous 3 times per day  . lactose free nutrition  237 mL Oral q morning - 10a  . LORazepam  1 mg Oral QHS  . polyvinyl alcohol  1 drop Both Eyes q morning - 10a  . saccharomyces boulardii  250 mg Oral BID  . senna-docusate  2 tablet Oral Daily  . simvastatin  20 mg Oral q1800    Continuous Infusions:    Time spent: 38mins  Bernedette Auston MD, PhD  [facility] Pager [phone]. If 7PM-7AM, please contact night-coverage at [email or web address], password [facility] 12/19/2014, 12:23 PM  LOS: 1 day

## 2014-12-19 NOTE — Consult Note (Signed)
Patient ID: Jacob Rosales MRN: [id number], DOB/AGE: 79-Aug-1929   Admit date: 12/18/2014   Primary Physician: Hollace Kinnier, DO Primary Cardiologist: Dr. Lovena Le  Reason for Consult: Acute CHF Referring Physician: Internal Medicine  Pt. Profile:  81 y/o male with h/o CAD s/p CABG, ICM s/p ICD, CHB, chronic systolic HF, HTN, HLD, PAF, prior TIA and dementia admitted by Internal Medicine 4/17 for dyspnea, weakness and confusion.   Problem List  Past Medical History  Diagnosis Date  . Complete AV block   . Chronic systolic heart failure   . Cardiomyopathy, ischemic   . Automatic implantable cardiac defibrillator in situ   . CAD (coronary artery disease) of artery bypass graft   . Prostate cancer     S/P radiation rx  . Osteoporosis     A/P Vertebral compression fx's  . Hypertension   . Dyslipidemia   . TIA (transient ischemic attack)     H/o  . Lumbago 10/05/2012  . Cervicalgia 04/06/2012  . 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 Unspecified vitamin D deficiency 10/07/2011  . Unspecified pruritic disorder 10/07/2011  . Rash and other nonspecific skin eruption 10/07/2011  . Basal cell carcinoma of skin of lower limb, including hip 04/08/2011  . Squamous cell carcinoma of skin of lower limb, including hip 04/08/2011  . Unspecified sinusitis (chronic) 10/08/2010  . Impotence of organic origin 10/08/2010  . Seborrhea 04/09/2010  . Insomnia, unspecified 04/09/2010  . Altered mental status 01/12/2010  . Full incontinence of feces 10/09/2009  . Atrial fibrillation 05/15/2006  . Unspecified urinary incontinence 05/15/2006  . Sebaceous cyst 10/09/2005  . Pathologic fracture of vertebrae 05/16/1991  . Fracture of C2 vertebra, closed     s/p cervical fusion 05/2013  . Osteoporosis with fracture 04/05/2013    History of vertebal fracture.   Marland Kitchen AICD (automatic cardioverter/defibrillator) present     Past Surgical History  Procedure Laterality Date  . Coronary artery bypass graft    .  Hernia repair    . Kyphosis surgery    . Biv-icd  11/21/2010    [employer] ICD Model#CD3231-40 [phone]  . Cardiac defibrillator placement    . Cyst excision  10/2012    back Dr. Wilhemina Bonito  . Cyst removal neck  12/2012    basel cell (right) Dr. Sarajane Jews  . Colonoscopy  2006    polyps benign  . Dexa  April 2010  . Posterior cervical fusion/foraminotomy N/A 05/21/2013    Procedure: Cervical One, Cervical Two, Cervical Three Posterior cervical fusion with lateral mass fixation;  Surgeon: Charlie Pitter, MD;  Location: [facility];  Service: Neurosurgery;  Laterality: N/A;  POSTERIOR CERVICAL FUSION/FORAMINOTOMY LEVEL 2     Allergies  Allergies  Allergen Reactions  . Warfarin Sodium Other (See Comments)    REACTION: Stomach bleeding  . Amlodipine Besylate Swelling    HPI  The patient is an 81 y/o male, followed by Dr. Lovena Le. He resides at [facility]. His PMH is significant for CAD s/p prior MI with CABG, ICM s/p [employer] BiV-ICD, chronic systolic HF (EF 10-93% on echo 04/2014), h/o CHB, Chronic AF (allergic to coumadin. He is on Aggrenox), HTN, HLD, prior TIA, prostate cancer with past radiation, vertebral compression fractures and past cervical spine fracture in 2014.  His device is followed by Dr. Lovena Le. Last OV was 08/2014 and he was noted to be stable from a cardiac standpoint. He was instructed to f/u in 6 months.  On 12/18/14, he presented to [facility] with multiple complaints, including progressive confusion, weakness and dyspnea. Per notes, he apparently has had progressive decline in memory over the last 6 months per spouse report. Patient was transported to the ED by EMS. En route, patient was hypoxic with O2 sats  reportedly in the 80s and he required supplemental O2 via [place]. CXR findings were consistent with acute CHF demonstrating cardiomegaly and central vascular congestion and small pleural effusions. BNP elevated at 3590. EKGs show v-paced rhythm. Cardiac enzymes mildly elevated x  3 (0.10-->0.11-->0.09). D-dimer elevated at 12.30 (V/Q scan pending). Also with acute renal insufficiency with admission Scr of 1.78 (baseline ~1.4). No overt signs of infection with no fever and no leukocytosis.   Cardiology has been consulted for recommendations regarding acute on chronic CHF. Since admission yesterday, the patient has received 2 doses of IV Lasix (40 mg on admit and 80 mg IV this am). He is also currently on 3.125 mg of Coreg BID, Aggrenox and simvastatin. His lisinopril is on hold given his ARI. BP is moderately elevated ranging from the 998P-382N systolic. HR in the 80s. He still appears to be confused and is a poor historian. He has a Actuary by his bedside and appears to be resting comfortably on 3L [place]. He denies CP and dyspnea.   Home Medications  Prior to Admission medications   Medication Sig Start Date End Date Taking? Authorizing Provider  amoxicillin-clavulanate (AUGMENTIN) 875-125 MG per tablet Take 1 tablet by mouth 2 (two) times daily.   Yes Historical Provider, MD  Calcium Carbonate-Vitamin D (CALCIUM 600 + D PO) Take 1 tablet by mouth daily.    Yes Historical Provider, MD  carvedilol (COREG) 25 MG tablet One twice daily to strengthen the heart Patient taking differently: Take one tablet by mouth twice daily to strengthen the hear 12/13/13  Yes Tiffany L Reed, DO  dipyridamole-aspirin (AGGRENOX) 200-25 MG per 12 hr capsule Take one tablet twice daily to reduce stroke 10/17/14  Yes Tiffany L Reed, DO  lactose free nutrition (BOOST) LIQD Take 237 mLs by mouth every morning.   Yes Historical Provider, MD  lisinopril (PRINIVIL,ZESTRIL) 20 MG tablet Take one tablet by mouth once daily to control blood pressure 05/26/14  Yes Estill Dooms, MD  LORazepam (ATIVAN) 1 MG tablet Take 1 tablet (1 mg total) by mouth at bedtime as needed (for sleep). Patient taking differently: Take 1 mg by mouth at bedtime.  11/09/14  Yes Estill Dooms, MD  Polyethyl Glycol-Propyl Glycol 0.4-0.3 %  GEL Apply 1 drop to eye every morning.   Yes Historical Provider, MD  polyethylene glycol (MIRALAX / GLYCOLAX) packet Take 17 g by mouth daily as needed for moderate constipation.   Yes Historical Provider, MD  saccharomyces boulardii (FLORASTOR) 250 MG capsule Take 250 mg by mouth 2 (two) times daily.   Yes Historical Provider, MD  senna-docusate (SENOKOT-S) 8.6-50 MG per tablet Take 2 tablets by mouth daily. 05/24/13  Yes Claudette T Levie Heritage, NP  simvastatin (ZOCOR) 20 MG tablet TAKE 1 TABLET BY MOUTH AT BEDTIME FOR CHOLESTEROL 12/01/14  Yes Tiffany L Reed, DO  tolterodine (DETROL LA) 4 MG 24 hr capsule Take 4 mg by mouth every morning.   Yes Historical Provider, MD  triamterene-hydrochlorothiazide (DYAZIDE) 37.5-25 MG per capsule Take 1 capsule by mouth every morning.   Yes Historical Provider, MD  alendronate (FOSAMAX) 70 MG tablet Take 1 tablet (70 mg total) by mouth once a week. Take with a full  glass of water on an empty stomach. Take at least 30 minutes before eating, do not lie down for 30 minutes after taking. Patient not taking: Reported on 12/18/2014 12/09/14   Tiffany L Reed, DO  AMBULATORY NON FORMULARY MEDICATION Forteo Needles and Syringes Use for subcutaneous injections of Forteo daily 09/19/14   Tiffany L Reed, DO  cholecalciferol (VITAMIN D) 1000 UNITS tablet Take 1,000 Units by mouth daily.      Historical Provider, MD  furosemide (LASIX) 20 MG tablet TAKE 1 TABLET (20 MG TOTAL) BY MOUTH DAILY. Patient not taking: Reported on 12/18/2014 11/09/14   Tiffany L Reed, DO  triamcinolone (KENALOG) 0.025 % cream Apply 1 application topically 2 (two) times daily as needed (rash).    Historical Provider, MD    Family History  Family History  Problem Relation Age of Onset  . Coronary artery disease Neg Hx   . Heart disease Mother     Social History  History   Social History  . Marital Status: Married    Spouse Name: N/A  . Number of Children: N/A  . Years of Education: N/A    Occupational History  . Retired Chief Financial Officer    Social History Main Topics  . Smoking status: Former Smoker    Quit date: 01/13/1962  . Smokeless tobacco: Never Used  . Alcohol Use: 1.2 oz/week    1 Glasses of wine, 1 Cans of beer per week  . Drug Use: No  . Sexual Activity: Not Currently   Other Topics Concern  . Not on file   Social History Narrative   Patient is Married [name]), retired Chief Financial Officer.    Lives with spouse in single level home at [place] since 2010.   Smoked cigars, and pipe, stopped 1960's,  minimal alcohol history .   Patient has Advanced planning documents:  Living Will, DNR   Walks with cane                 Review of Systems General:  No chills, fever, night sweats or weight changes.  Cardiovascular:  No chest pain, dyspnea on exertion, edema, orthopnea, palpitations, paroxysmal nocturnal dyspnea. Dermatological: No rash, lesions/masses Respiratory: No cough, dyspnea Urologic: No hematuria, dysuria Abdominal:   No nausea, vomiting, diarrhea, bright red blood per rectum, melena, or hematemesis Neurologic:  No visual changes, wkns, changes in mental status. All other systems reviewed and are otherwise negative except as noted above.  Physical Exam  Blood pressure 174/93, pulse 80, temperature 97.9 F (36.6 C), temperature source Oral, resp. rate 21, height 6\' 2"  (1.88 m), weight 148 lb 4.8 oz (67.268 kg), SpO2 96 %.  General: Pleasant, NAD Psych: Normal affect. Neuro: Alert and oriented X 3. Moves all extremities spontaneously. HEENT: Normal  Neck: Supple without bruits or JVD. Lungs:  Resp regular and unlabored, CTA. Heart: RRR no s3, s4, or murmurs. Abdomen: Soft, non-tender, non-distended, BS + x 4.  Extremities: No clubbing, cyanosis or edema. DP/PT/Radials 2+ and equal bilaterally.  Labs  Troponin (Point of Care Test) No results for input(s): TROPIPOC in the last 72 hours.  Recent Labs  12/18/14 2042 12/19/14 0125  12/19/14 0755  TROPONINI 0.10* 0.11* 0.09*   Lab Results  Component Value Date   WBC 5.7 12/18/2014   HGB 13.3 12/18/2014   HCT 40.1 12/18/2014   MCV 96.9 12/18/2014   PLT 108* 12/18/2014    Recent Labs Lab 12/19/14 0125  NA 141  K 4.1  CL 106  CO2 23  BUN 48*  CREATININE 1.86*  CALCIUM 9.2  PROT 6.7  BILITOT 1.9*  ALKPHOS 64  ALT 220*  AST 200*  GLUCOSE 117*   Lab Results  Component Value Date   CHOL 120 07/05/2014   HDL 49 07/05/2014   LDLCALC 63 07/05/2014   TRIG 76 07/05/2014   Lab Results  Component Value Date   DDIMER 12.30* 12/18/2014     Radiology/Studies  Dg Chest 2 View  12/18/2014   CLINICAL DATA:  Cough, SOB, AMS, PNA 12/16/14 HX: HTN, CABG, Defibrillator, Open Heart Surgery  EXAM: CHEST  2 VIEW  COMPARISON:  05/13/2014.  FINDINGS: Changes from CABG surgery are stable. Cardiac silhouette is mildly enlarged. Aorta is mildly uncoiled. No mediastinal or hilar masses. Left anterior chest wall AICD is stable and well positioned.  There is central vascular congestion. There are small bilateral pleural effusions. Mild lower lung zone interstitial thickening is noted.  No pneumothorax.  Bony thorax is demineralized. There are compression fractures of multiple vertebrae, 1 treated with prior kyphoplasty, stable from the prior study.  IMPRESSION: Cardiomegaly with lower lung zone interstitial thickening with central vascular congestion and small pleural effusions. Findings are consistent with mild congestive heart failure.   Electronically Signed   By: Lajean Manes M.D.   On: 12/18/2014 15:40   US Abdomen Limited Ruq  12/19/2014   CLINICAL DATA:  81 year old male with elevated liver function tests. History of skin cancer and prostate cancer. Hypertension. Initial encounter.  EXAM: US ABDOMEN LIMITED - RIGHT UPPER QUADRANT  COMPARISON:  None.  FINDINGS: Gallbladder:  Diffuse gallbladder wall thickening measuring up to 8 mm. No gallstone visualized. Patient was not  tender over this region during scanning per ultrasound technologist.  Common bile duct:  Diameter: 4.3 mm.  Liver:  No focal liver lesion. Left lobe liver suboptimally evaluated secondary to patient's habitus and bowel gas.  Right-sided pleural effusion and trace ascites noted.  IMPRESSION: Diffuse gallbladder wall thickening measuring up to 8 mm. No gallstone visualized. Patient was not tender over this region during scanning as may be expected with acute cholecystitis. This appearance may be related to elevated right heart pressures, third spacing of fluid, metabolic abnormality or liver disease. Chronic cholecystitis could cause a similar appearance in the proper clinical setting. No focal mass along the gallbladder wall thickening identified.  Right-sided pleural effusion and trace ascites.  Suboptimal evaluation left lobe liver secondary to patient's habitus and bowel gas.   Electronically Signed   By: Genia Del M.D.   On: 12/19/2014 07:55    ECG  V- paced rhythm   Echocardiogram  pending    ASSESSMENT AND PLAN  Active Problems:   CHF (congestive heart failure)   SOB (shortness of breath)   1. Chronic Systolic CHF: last known EF was 25-30% on echo 04/2014. Repeat 2D echo pending. Both CXR and BNP consistent with acute exacerbation. He has received 120 mg total of IV Lasix since admission yesterday. I/Os net negative 1.8L. He does not appear to be volume overloaded based on my exam today. With rising SCr from 1.78-->1.86 (baseline ~1.4), recommend backing down on IV Lasix today. Reassess in the am. Continue Coreg. Hold ACE-I given ARI. Can further increase BB and/or add hydralazine + nitrate in place of ACE-I. Continue low sodium diet, strict I/Os and daily weights to monitor volume status.   2. Elevated Troponin: mildly elevated with flat trend. He has h/o CAD but denies CP. EKG w/o ischemia. 2D echo pending. However,  suspect likely demand ischemia from acute CHF. No further w/u  indicated at this time. May pursue further w/u if 2D echo is significantly abnormal/ further reduction in EF.   3. CAD: h/o CABG. No chest pain. Continue medical therapy.   4. Elevated D-dimer: V/Q scan ordered by primary team to rule out PE.   5. Dementia: management per IM   Signed, SIMMONS, BRITTAINY, PA-C 12/19/2014, 11:47 AM  Patient seen, examined. Available data reviewed. Agree with findings, assessment, and plan as outlined by Lyda Jester, PA-C. The patient is independently interviewed and examined. He is an elderly, pleasantly confused male in no distress. He is unable to provide any meaningful history. Jugular venous pressure is elevated. Lungs are clear on exam. Heart is regular rate and rhythm with a grade 2/6 holosystolic murmur at the apex. The abdomen is diffusely tender without rebound or guarding. There is no peripheral edema. There is radiographic evidence of congestive heart failure on chest x-ray. BNP is markedly elevated. Of note, the patient's renal function is impaired and he has evidence of acute kidney injury on background of stage III chronic kidney disease. Recommend hold ACE inhibitor, continue carvedilol, and hold further IV Lasix. As long as renal function is stable, would resume furosemide 40 mg orally tomorrow.  Echo from 05/02/2014 was reviewed. This showed an ejection fraction of 25-30% with moderate mitral regurgitation. I do not see any reason to repeat this study. The patient is not a candidate for any invasive cardiac evaluation. No further recommendations at this time.  Sherren Mocha, M.D. 12/19/2014 2:19 PM

## 2014-12-19 NOTE — Progress Notes (Signed)
Utilization review complete 

## 2014-12-19 NOTE — Progress Notes (Signed)
  Echocardiogram 2D Echocardiogram has been performed.  Jacob Rosales 12/19/2014, 4:38 PM

## 2014-12-20 ENCOUNTER — Inpatient Hospital Stay (HOSPITAL_COMMUNITY): Payer: Medicare Other

## 2014-12-20 DIAGNOSIS — R0602 Shortness of breath: Secondary | ICD-10-CM

## 2014-12-20 DIAGNOSIS — R945 Abnormal results of liver function studies: Secondary | ICD-10-CM

## 2014-12-20 DIAGNOSIS — R7989 Other specified abnormal findings of blood chemistry: Secondary | ICD-10-CM | POA: Diagnosis present

## 2014-12-20 DIAGNOSIS — R791 Abnormal coagulation profile: Secondary | ICD-10-CM

## 2014-12-20 DIAGNOSIS — I509 Heart failure, unspecified: Secondary | ICD-10-CM

## 2014-12-20 DIAGNOSIS — R410 Disorientation, unspecified: Secondary | ICD-10-CM

## 2014-12-20 LAB — CBC
HCT: 43.2 % (ref 39.0–52.0)
HEMOGLOBIN: 13.5 g/dL (ref 13.0–17.0)
MCH: 30.5 pg (ref 26.0–34.0)
MCHC: 31.3 g/dL (ref 30.0–36.0)
MCV: 97.7 fL (ref 78.0–100.0)
Platelets: 125 10*3/uL — ABNORMAL LOW (ref 150–400)
RBC: 4.42 MIL/uL (ref 4.22–5.81)
RDW: 15.6 % — ABNORMAL HIGH (ref 11.5–15.5)
WBC: 6.1 10*3/uL (ref 4.0–10.5)

## 2014-12-20 LAB — BRAIN NATRIURETIC PEPTIDE: B Natriuretic Peptide: 2089.9 pg/mL — ABNORMAL HIGH (ref 0.0–100.0)

## 2014-12-20 LAB — LACTIC ACID, PLASMA: Lactic Acid, Venous: 1.5 mmol/L (ref 0.5–2.0)

## 2014-12-20 MED ORDER — CARVEDILOL 6.25 MG PO TABS
6.2500 mg | ORAL_TABLET | Freq: Two times a day (BID) | ORAL | Status: DC
  Administered 2014-12-20 – 2014-12-23 (×6): 6.25 mg via ORAL
  Filled 2014-12-20 (×8): qty 1

## 2014-12-20 MED ORDER — RESOURCE THICKENUP CLEAR PO POWD
ORAL | Status: DC | PRN
  Filled 2014-12-20: qty 125

## 2014-12-20 NOTE — Progress Notes (Signed)
Bilateral lower extremity venous duplex completed:  No evidence of DVT or superficial thrombosis.  There appears to be a Baker's cyst in the right popliteal fossa.

## 2014-12-20 NOTE — Care Management Note (Addendum)
    Page 1 of 1   12/21/2014     3:36:15 PM CARE MANAGEMENT NOTE 12/21/2014  Patient:  Jacob Rosales, Jacob Rosales   Account Number:  [id number]  Date Initiated:  12/20/2014  Documentation initiated by:  Carles Collet  Subjective/Objective Assessment:   pt admitted with confusion,  elevated BNP, SLP for swallow study. From [facility]. Wife and daughter supportive and at bedside     Action/Plan:   will follow for discharge needs  pt eval- rec snf.   Anticipated DC Date:  12/21/2014   Anticipated DC Plan:  SKILLED NURSING FACILITY  In-house referral  Clinical Social Worker      DC Planning Services  CM consult      Choice offered to / List presented to:             Status of service:  In process, will continue to follow Medicare Important Message given?  YES (If response is "NO", the following Medicare IM given date fields will be blank) Date Medicare IM given:  12/20/2014 Medicare IM given by:  Carles Collet Date Additional Medicare IM given:   Additional Medicare IM given by:    Discharge Disposition:    Per UR Regulation:  Reviewed for med. necessity/level of care/duration of stay  If discussed at [place] of Stay Meetings, dates discussed:    Comments:  12/21/14 [place], BSN [phone] patient is from [facility], per pt eval rec snf, CSW referral.  12-20-14. IM letter given, will continue to follow for discharge needs. Carles Collet RN BSN CM

## 2014-12-20 NOTE — Progress Notes (Signed)
PROGRESS NOTE  Jacob Rosales ZOX:[id number] DOB: 05-Feb-1928 DOA: 12/18/2014 PCP: Hollace Kinnier, DO  HPI/Recap of past 24 hours:  Patient can not provide reliable history,only oriented to self during exam, still on supplemental oxygen, no cough noted during exam.  Patient is calm, has mittens on. Family refused cmp to be drawn today  Assessment/Plan: Principal Problem:   SOB (shortness of breath) Active Problems:   Acute on chronic systolic CHF (congestive heart failure), NYHA class 3   Elevated d-dimer  Hypoxia: from chf exacerbation, Cxr/bnp consistent with chf, no edema on physical exam, denies chest pain, EKG no acute ST/T changes, flat cardiac enzymes, echo low EF,  Lasix 80bid initially, cards input appreciated, currently lasix held due to worsening of cr and poor oral intake   No overt sign of infection, no fever, no leukocytosis,   Wean oxygen as able  Elevation of  Ddimer,V/Q scan low probability. Lower extremity venous doppler no DVT.  Confusion, Ct head global atrophy, no acute findings, metabolic in the setting of acute CHF exacerbation or progress of chronic dementia,  ammonia/b12/folate/tsh unremarkable. Sitter/mittens.fall precaution.  ARF: ua on infection, renal US to r/o obstruction, ARF possible for CHF exacerbation.hold acei.  LFT elevation: likely from congestion,  liver US diffuse gallbladder wall thickening not tender, may be related to elevated right heart pressure,  negativehepatitis panel, avoid hepatic toxin.  HTN: continue coreg, hold acei  H/o afib/avblock s/p pacemaker: Allergic to coumadin, not a good candidate for anticoagulation, has been on aggrenox which is continued.  DVT prophylaxis with heparin subQ.   Consultants: none  Code Status: DNR confirmed with wife  Family Communication: Patient/wife/daughter  Disposition Plan: inpatient tele   Antibiotics:  VQ scan/ct head/echo   Objective: BP 137/79 mmHg  Pulse 61  Temp(Src)  98.4 F (36.9 C) (Oral)  Resp 22  Ht 6\' 2"  (1.88 m)  Wt 66.225 kg (146 lb)  BMI 18.74 kg/m2  SpO2 92%  Intake/Output Summary (Last 24 hours) at 12/20/14 2154 Last data filed at 12/20/14 1839  Gross per 24 hour  Intake    260 ml  Output      0 ml  Net    260 ml   Filed Weights   12/18/14 1803 12/19/14 0449 12/20/14 0509  Weight: 70.126 kg (154 lb 9.6 oz) 67.268 kg (148 lb 4.8 oz) 66.225 kg (146 lb)    Exam:   General:  NAD  Cardiovascular: RRR, paced rhythm  Respiratory: CTABL  Abdomen: Soft/ND/NT, positive BS  Musculoskeletal: No Edema  Neuro: calm but only oriented to person, does following commend. Poor short term memory.  Data Reviewed: Basic Metabolic Panel:  Recent Labs Lab 12/18/14 1411 12/19/14 0125 12/19/14 1923  NA 138 141 143  K 4.4 4.1 3.6  CL 106 106 105  CO2 19 23 26   GLUCOSE 134* 117* 88  BUN 49* 48* 47*  CREATININE 1.78* 1.86* 1.80*  CALCIUM 9.2 9.2 8.9  MG  --  2.2  --    Liver Function Tests:  Recent Labs Lab 12/18/14 1411 12/19/14 0125 12/19/14 1923  AST 236* 200* 199*  ALT 208* 220* 257*  ALKPHOS 72 64 65  BILITOT 1.8* 1.9* 2.1*  PROT 6.6 6.7 6.5  ALBUMIN 3.9 3.9 3.7   No results for input(s): LIPASE, AMYLASE in the last 168 hours.  Recent Labs Lab 12/18/14 2042  AMMONIA 20   CBC:  Recent Labs Lab 12/18/14 1411 12/18/14 2042 12/20/14 0704  WBC 5.7  --  6.1  NEUTROABS 4.5  --   --   HGB 13.3  --  13.5  HCT 40.1 42.0 43.2  MCV 96.9  --  97.7  PLT 108*  --  125*   Cardiac Enzymes:    Recent Labs Lab 12/18/14 2042 12/19/14 0125 12/19/14 0755  TROPONINI 0.10* 0.11* 0.09*   BNP (last 3 results)  Recent Labs  12/18/14 1411 12/19/14 0125 12/20/14 0704  BNP 2286.3* 3590.3* 2089.9*    ProBNP (last 3 results)  Recent Labs  04/27/14 1539 06/16/14 1440  PROBNP 1024.0* 982.0*    CBG:  Recent Labs Lab 12/18/14 1425  GLUCAP 142*    Recent Results (from the past 240 hour(s))  Blood culture  (routine x 2)     Status: None (Preliminary result)   Collection Time: 12/18/14  2:10 PM  Result Value Ref Range Status   Specimen Description BLOOD RIGHT HAND  Final   Special Requests BOTTLES DRAWN AEROBIC ONLY 10CC  Final   Culture   Final           BLOOD CULTURE RECEIVED NO GROWTH TO DATE CULTURE WILL BE HELD FOR 5 DAYS BEFORE ISSUING A FINAL NEGATIVE REPORT Performed at [facility]    Report Status PENDING  Incomplete  Culture, blood (routine x 2)     Status: None (Preliminary result)   Collection Time: 12/18/14  2:27 PM  Result Value Ref Range Status   Specimen Description BLOOD RIGHT ANTECUBITAL  Final   Special Requests BOTTLES DRAWN AEROBIC ONLY 10CC  Final   Culture   Final           BLOOD CULTURE RECEIVED NO GROWTH TO DATE CULTURE WILL BE HELD FOR 5 DAYS BEFORE ISSUING A FINAL NEGATIVE REPORT Performed at [facility]    Report Status PENDING  Incomplete     Studies: Dg Chest 2 View  12/19/2014   CLINICAL DATA:  Short of breath for 1 week  EXAM: CHEST  2 VIEW  COMPARISON:  12/18/2014  FINDINGS: The heart is moderately enlarged. Pulmonary edema and vascular congestion are worse. AICD device and leads are stable. No pneumothorax. Pleural effusions are worse. Stable thoracic spine with multiple compression deformities.  IMPRESSION: Worsening CHF and worsening pleural effusions.   Electronically Signed   By: Marybelle Killings M.D.   On: 12/19/2014 19:01   Ct Head Wo Contrast  12/19/2014   CLINICAL DATA:  Confusion  EXAM: CT HEAD WITHOUT CONTRAST  TECHNIQUE: Contiguous axial images were obtained from the base of the skull through the vertex without intravenous contrast.  COMPARISON:  01/13/2010  FINDINGS: Moderate global atrophy. Chronic ischemic changes in the periventricular white matter and bilateral basal ganglia. There is no mass effect, midline shift, or acute hemorrhage. Mastoid air cells are clear. Cranium is intact. Fusion hardware in the upper cervical spine is  noted.  IMPRESSION: No acute intracranial pathology.   Electronically Signed   By: Marybelle Killings M.D.   On: 12/19/2014 18:51   US Renal  12/20/2014   CLINICAL DATA:  Elevated creatinine.  EXAM: RENAL/URINARY TRACT ULTRASOUND COMPLETE  COMPARISON:  None.  FINDINGS: Right Kidney:  Length: 10.8 cm. Cortical thinning. Echogenicity within normal limits. No mass or hydronephrosis visualized.  Left Kidney:  Length: 10.6 cm. Cortical thinning. Echogenicity within normal limits. No significant mass or hydronephrosis visualized. 3.5 cm simple cyst left lower pole.  Bladder:  The bladder is nondistended.  Foley catheter present.  IMPRESSION: Bilateral renal cortical thinning. No  acute abnormality. No hydronephrosis or bladder distention. Foley catheter present.   Electronically Signed   By: Marcello Moores  Register   On: 12/20/2014 08:27   Nm Pulmonary Perf And Vent  12/19/2014   CLINICAL DATA:  81 year old male with shortness of breath.  EXAM: NUCLEAR MEDICINE VENTILATION - PERFUSION LUNG SCAN  TECHNIQUE: Ventilation images were obtained in multiple projections using inhaled aerosol technetium 99 M DTPA. Perfusion images were obtained in multiple projections after intravenous injection of Tc-70m MAA.  RADIOPHARMACEUTICALS:  40.0 mCi Tc-35m DTPA aerosol and 6.0 mCi Tc-7m MAA  COMPARISON:  12/18/2014 chest radiograph.  FINDINGS: Ventilation: No focal ventilation defects identified. Mild central clumping is present.  Perfusion: No wedge shaped peripheral perfusion defects to suggest acute pulmonary embolism. Minimal nonsegmental patchy areas of slightly decreased profusion noted.  IMPRESSION: Very low probability of pulmonary embolus (less than 10%).   Electronically Signed   By: Margarette Canada M.D.   On: 12/19/2014 18:37   US Abdomen Limited Ruq  12/19/2014   CLINICAL DATA:  81 year old male with elevated liver function tests. History of skin cancer and prostate cancer. Hypertension. Initial encounter.  EXAM: US ABDOMEN LIMITED -  RIGHT UPPER QUADRANT  COMPARISON:  None.  FINDINGS: Gallbladder:  Diffuse gallbladder wall thickening measuring up to 8 mm. No gallstone visualized. Patient was not tender over this region during scanning per ultrasound technologist.  Common bile duct:  Diameter: 4.3 mm.  Liver:  No focal liver lesion. Left lobe liver suboptimally evaluated secondary to patient's habitus and bowel gas.  Right-sided pleural effusion and trace ascites noted.  IMPRESSION: Diffuse gallbladder wall thickening measuring up to 8 mm. No gallstone visualized. Patient was not tender over this region during scanning as may be expected with acute cholecystitis. This appearance may be related to elevated right heart pressures, third spacing of fluid, metabolic abnormality or liver disease. Chronic cholecystitis could cause a similar appearance in the proper clinical setting. No focal mass along the gallbladder wall thickening identified.  Right-sided pleural effusion and trace ascites.  Suboptimal evaluation left lobe liver secondary to patient's habitus and bowel gas.   Electronically Signed   By: Genia Del M.D.   On: 12/19/2014 07:55    Scheduled Meds: . antiseptic oral rinse  7 mL Mouth Rinse BID  . carvedilol  6.25 mg Oral BID WC  . dipyridamole-aspirin  1 capsule Oral Daily  . heparin subcutaneous  5,000 Units Subcutaneous 3 times per day  . lactose free nutrition  237 mL Oral q morning - 10a  . LORazepam  1 mg Oral QHS  . polyvinyl alcohol  1 drop Both Eyes q morning - 10a  . saccharomyces boulardii  250 mg Oral BID  . senna-docusate  2 tablet Oral Daily  . simvastatin  20 mg Oral q1800    Continuous Infusions:    Time spent: 6mins  Danese Dorsainvil MD, PhD  [facility] Pager [phone]. If 7PM-7AM, please contact night-coverage at [email or web address], password [facility] 12/20/2014, 9:54 PM  LOS: 2 days

## 2014-12-20 NOTE — Progress Notes (Signed)
MBSS complete. Full report located under chart review in imaging section.  

## 2014-12-20 NOTE — Progress Notes (Signed)
Patient Name: Jacob Rosales Date of Encounter: 12/20/2014  Principal Problem:   SOB (shortness of breath) Active Problems:   Acute on chronic systolic CHF (congestive heart failure), NYHA class 3   Elevated d-dimer   Primary Cardiologist: Dr Lovena Le  Patient Profile: 81 y/o male with h/o CAD s/p CABG, ICM s/p ICD, CHB, chronic systolic HF, HTN, HLD, PAF, prior TIA and dementia admitted by Internal Medicine 4/17 for dyspnea, weakness and confusion.   SUBJECTIVE: Confused, has trouble swallowing, w/ small sips and throat clears does OK. Denies chest pain or SOB.  OBJECTIVE Filed Vitals:   12/19/14 0539 12/19/14 1450 12/19/14 2217 12/20/14 0509  BP: 174/93 140/84 142/85 133/97  Pulse: 80 62 67   Temp: 97.9 F (36.6 C) 98 F (36.7 C) 98.2 F (36.8 C) 97.5 F (36.4 C)  TempSrc: Oral Oral Oral Oral  Resp: 21 16 17 18   Height:      Weight:    146 lb (66.225 kg)  SpO2: 96% 97% 100% 96%    Intake/Output Summary (Last 24 hours) at 12/20/14 0956 Last data filed at 12/20/14 0954  Gross per 24 hour  Intake     90 ml  Output   2225 ml  Net  -2135 ml   Filed Weights   12/18/14 1803 12/19/14 0449 12/20/14 0509  Weight: 154 lb 9.6 oz (70.126 kg) 148 lb 4.8 oz (67.268 kg) 146 lb (66.225 kg)    PHYSICAL EXAM General: Well developed, well nourished, male in no acute distress. Head: Normocephalic, atraumatic.  Neck: Supple without bruits, JVD 8-9 cm. Lungs:  Resp regular and unlabored, rales bases. Heart: RRR, S1, S2, no S3, S4, 2/6 murmur; no rub. Abdomen: Soft, non-tender, non-distended, BS + x 4.  Extremities: No clubbing, cyanosis, no edema.  Neuro: Alert and oriented X 1. Moves all extremities spontaneously.  LABS: CBC:  Recent Labs  12/18/14 1411 12/18/14 2042 12/20/14 0704  WBC 5.7  --  6.1  NEUTROABS 4.5  --   --   HGB 13.3  --  13.5  HCT 40.1 42.0 43.2  MCV 96.9  --  97.7  PLT 108*  --  125*   INR:No results for input(s): INR in the last 72  hours. Basic Metabolic Panel:  Recent Labs  12/19/14 0125 12/19/14 1923  NA 141 143  K 4.1 3.6  CL 106 105  CO2 23 26  GLUCOSE 117* 88  BUN 48* 47*  CREATININE 1.86* 1.80*  CALCIUM 9.2 8.9  MG 2.2  --    Liver Function Tests:  Recent Labs  12/19/14 0125 12/19/14 1923  AST 200* 199*  ALT 220* 257*  ALKPHOS 64 65  BILITOT 1.9* 2.1*  PROT 6.7 6.5  ALBUMIN 3.9 3.7   Cardiac Enzymes:  Recent Labs  12/18/14 2042 12/19/14 0125 12/19/14 0755  TROPONINI 0.10* 0.11* 0.09*    BNP:  B NATRIURETIC PEPTIDE  Date/Time Value Ref Range Status  12/20/2014 07:04 AM 2089.9* 0.0 - 100.0 pg/mL Final  12/19/2014 01:25 AM 3590.3* 0.0 - 100.0 pg/mL Final   D-dimer:  Recent Labs  12/18/14 2042  DDIMER 12.30*   Anemia Panel:  Recent Labs  12/18/14 2042  VITAMINB12 1504*    TELE:  Not on   ECHO: 12/19/2014 Conclusions  - Left ventricle: Apex moves a little better than the other walls. The cavity size was moderately dilated. The estimated ejection fraction was 25%. Diffuse hypokinesis. - Aortic valve: Sclerosis without stenosis. There was mild  regurgitation. - Aorta: Mild dilitation of the aortic root. - Mitral valve: There was moderate regurgitation. - Left atrium: The atrium was moderately dilated. - Right ventricle: The cavity size was mildly dilated. Pacer wire or catheter noted in right ventricle. Systolic function was mildly reduced. - Pulmonary arteries: PA peak pressure: 52 mm Hg (S).  Radiology/Studies: Dg Chest 2 View  12/19/2014   CLINICAL DATA:  Short of breath for 1 week  EXAM: CHEST  2 VIEW  COMPARISON:  12/18/2014  FINDINGS: The heart is moderately enlarged. Pulmonary edema and vascular congestion are worse. AICD device and leads are stable. No pneumothorax. Pleural effusions are worse. Stable thoracic spine with multiple compression deformities.  IMPRESSION: Worsening CHF and worsening pleural effusions.   Electronically Signed   By: Marybelle Killings M.D.   On: 12/19/2014 19:01   Dg Chest 2 View  12/18/2014   CLINICAL DATA:  Cough, SOB, AMS, PNA 12/16/14 HX: HTN, CABG, Defibrillator, Open Heart Surgery  EXAM: CHEST  2 VIEW  COMPARISON:  05/13/2014.  FINDINGS: Changes from CABG surgery are stable. Cardiac silhouette is mildly enlarged. Aorta is mildly uncoiled. No mediastinal or hilar masses. Left anterior chest wall AICD is stable and well positioned.  There is central vascular congestion. There are small bilateral pleural effusions. Mild lower lung zone interstitial thickening is noted.  No pneumothorax.  Bony thorax is demineralized. There are compression fractures of multiple vertebrae, 1 treated with prior kyphoplasty, stable from the prior study.  IMPRESSION: Cardiomegaly with lower lung zone interstitial thickening with central vascular congestion and small pleural effusions. Findings are consistent with mild congestive heart failure.   Electronically Signed   By: Lajean Manes M.D.   On: 12/18/2014 15:40   Ct Head Wo Contrast  12/19/2014   CLINICAL DATA:  Confusion  EXAM: CT HEAD WITHOUT CONTRAST  TECHNIQUE: Contiguous axial images were obtained from the base of the skull through the vertex without intravenous contrast.  COMPARISON:  01/13/2010  FINDINGS: Moderate global atrophy. Chronic ischemic changes in the periventricular white matter and bilateral basal ganglia. There is no mass effect, midline shift, or acute hemorrhage. Mastoid air cells are clear. Cranium is intact. Fusion hardware in the upper cervical spine is noted.  IMPRESSION: No acute intracranial pathology.   Electronically Signed   By: Marybelle Killings M.D.   On: 12/19/2014 18:51   US Renal  12/20/2014   CLINICAL DATA:  Elevated creatinine.  EXAM: RENAL/URINARY TRACT ULTRASOUND COMPLETE  COMPARISON:  None.  FINDINGS: Right Kidney:  Length: 10.8 cm. Cortical thinning. Echogenicity within normal limits. No mass or hydronephrosis visualized.  Left Kidney:  Length: 10.6 cm. Cortical  thinning. Echogenicity within normal limits. No significant mass or hydronephrosis visualized. 3.5 cm simple cyst left lower pole.  Bladder:  The bladder is nondistended.  Foley catheter present.  IMPRESSION: Bilateral renal cortical thinning. No acute abnormality. No hydronephrosis or bladder distention. Foley catheter present.   Electronically Signed   By: Marcello Moores  Register   On: 12/20/2014 08:27   Nm Pulmonary Perf And Vent  12/19/2014   CLINICAL DATA:  80 year old male with shortness of breath.  EXAM: NUCLEAR MEDICINE VENTILATION - PERFUSION LUNG SCAN  TECHNIQUE: Ventilation images were obtained in multiple projections using inhaled aerosol technetium 99 M DTPA. Perfusion images were obtained in multiple projections after intravenous injection of Tc-58m MAA.  RADIOPHARMACEUTICALS:  40.0 mCi Tc-22m DTPA aerosol and 6.0 mCi Tc-20m MAA  COMPARISON:  12/18/2014 chest radiograph.  FINDINGS: Ventilation: No focal ventilation  defects identified. Mild central clumping is present.  Perfusion: No wedge shaped peripheral perfusion defects to suggest acute pulmonary embolism. Minimal nonsegmental patchy areas of slightly decreased profusion noted.  IMPRESSION: Very low probability of pulmonary embolus (less than 10%).   Electronically Signed   By: Margarette Canada M.D.   On: 12/19/2014 18:37   US Abdomen Limited Ruq  12/19/2014   CLINICAL DATA:  81 year old male with elevated liver function tests. History of skin cancer and prostate cancer. Hypertension. Initial encounter.  EXAM: US ABDOMEN LIMITED - RIGHT UPPER QUADRANT  COMPARISON:  None.  FINDINGS: Gallbladder:  Diffuse gallbladder wall thickening measuring up to 8 mm. No gallstone visualized. Patient was not tender over this region during scanning per ultrasound technologist.  Common bile duct:  Diameter: 4.3 mm.  Liver:  No focal liver lesion. Left lobe liver suboptimally evaluated secondary to patient's habitus and bowel gas.  Right-sided pleural effusion and trace  ascites noted.  IMPRESSION: Diffuse gallbladder wall thickening measuring up to 8 mm. No gallstone visualized. Patient was not tender over this region during scanning as may be expected with acute cholecystitis. This appearance may be related to elevated right heart pressures, third spacing of fluid, metabolic abnormality or liver disease. Chronic cholecystitis could cause a similar appearance in the proper clinical setting. No focal mass along the gallbladder wall thickening identified.  Right-sided pleural effusion and trace ascites.  Suboptimal evaluation left lobe liver secondary to patient's habitus and bowel gas.   Electronically Signed   By: Genia Del M.D.   On: 12/19/2014 07:55     Current Medications:  . antiseptic oral rinse  7 mL Mouth Rinse BID  . carvedilol  3.125 mg Oral BID WC  . dipyridamole-aspirin  1 capsule Oral Daily  . heparin subcutaneous  5,000 Units Subcutaneous 3 times per day  . lactose free nutrition  237 mL Oral q morning - 10a  . LORazepam  1 mg Oral QHS  . polyvinyl alcohol  1 drop Both Eyes q morning - 10a  . saccharomyces boulardii  250 mg Oral BID  . senna-docusate  2 tablet Oral Daily  . simvastatin  20 mg Oral q1800      ASSESSMENT AND PLAN: 1. Acute on Chronic Systolic CHF:   - EF was 37% by echo 04/18, unchanged.  - He received 120 mg total of IV Lasix 04/17 & 18.  - I/Os net negative 2.6L and weight is down.  - He does not appear to be volume overloaded based on exam today.  - With rising SCr from 1.78-->1.86 (baseline ~1.4), now off IV Lasix.   - Continue Coreg. Hold ACE-I given ARI.  - Will increase BB, then add hydralazine + nitrate in place of ACE-I.  - Continue low sodium diet, strict I/Os and daily weights to monitor volume status.  - Will need oral Lasix at some point, but PO intake not high, hold off for now  2. Elevated Troponin:  - minimal elevation with flat trend.  - He has h/o CAD but denies CP.  - EKG w/o ischemia.  - 2D echo  reviewed, EF unchanged and no sig WMA - suspect demand ischemia from acute CHF. No further w/u indicated at this time.  3. CAD: h/o CABG. No chest pain. Continue medical therapy.   4. Elevated D-dimer: V/Q scan w/ very low prob PE. Per IM   5. Dementia: management per IM   Signed, Rosaria Ferries , PA-C 9:56 AM 12/20/2014  Patinet seen  and examined.  I agree with findings as noted by R Barrett above   VOlume statu looks OK   Agree with increase of b blocker  Follow BP  If OK would add Hydralazine and Nitrate. Strict I/O    WIll contineu to follow .   Dorris Carnes

## 2014-12-20 NOTE — Progress Notes (Signed)
Pharmacist Heart Failure Core Measure Documentation  Assessment: Jacob Rosales has an EF documented as 25% on 12/19/2014 by Echo.  Rationale: Heart failure patients with left ventricular systolic dysfunction (LVSD) and an EF < 40% should be prescribed an angiotensin converting enzyme inhibitor (ACEI) or angiotensin receptor blocker (ARB) at discharge unless a contraindication is documented in the medical record.  This patient is not currently on an ACEI or ARB for HF.  This note is being placed in the record in order to provide documentation that a contraindication to the use of these agents is present for this encounter.  ACE Inhibitor or Angiotensin Receptor Blocker is contraindicated (specify all that apply)  []   ACEI allergy AND ARB allergy []   Angioedema []   Moderate or severe aortic stenosis []   Hyperkalemia []   Hypotension []   Renal artery stenosis [x]   Worsening renal function, preexisting renal disease or dysfunction  Per cardiology note: Continue Coreg. Hold ACE-I given ARI.    Jacob Rosales 12/20/2014 3:16 PM

## 2014-12-20 NOTE — Clinical Social Work Psychosocial (Signed)
Clinical Social Work Department BRIEF PSYCHOSOCIAL ASSESSMENT 12/20/2014  Patient:  Jacob, Rosales     Account Number:  [id number]     Admit date:  12/18/2014  Clinical Social Worker:  Kingsley Spittle, CLINICAL SOCIAL WORKER  Date/Time:  12/20/2014 03:40 PM  Referred by:  Physician  Date Referred:  12/20/2014 Referred for  SNF Placement   Other Referral:   N/A   Interview type:  Family Other interview type:   [name] [phone]    PSYCHOSOCIAL DATA Living Status:  WIFE Admitted from facility:  [facility] Level of care:  Assisted Living Primary support name:  Jacob Rosales Primary support relationship to patient:  SPOUSE Degree of support available:   Support is strong.    CURRENT CONCERNS Current Concerns  Post-Acute Placement   Other Concerns:   N/A    SOCIAL WORK ASSESSMENT / PLAN BSW intern spoke with wife and daughter at bedside about plans for discharge. Patient lives at [place] with his wife in the independent apartments however he was admitted into the rehab facility of [place] on friday where he stayed a few days before coming to the hospital. His wife would like for him to return to the rehab facility at [place] before returning back home to their apartment at [employer]. Wife and daughter were both engaged during assessment and hepful.   Assessment/plan status:  Psychosocial Support/Ongoing Assessment of Needs Other assessment/ plan:   N/A   Information/referral to community resources:   CSW contact information given to patient's family.    PATIENT'S/FAMILY'S RESPONSE TO PLAN OF CARE: Patient's family were engaged and open to conversation. Plans to return back to [place] rehab facility once stable. Ongoing assessment may be required.      Kingsley Spittle, [place] Intern, [phone]

## 2014-12-20 NOTE — Evaluation (Signed)
Physical Therapy Evaluation Patient Details Name: Jacob Rosales MRN: [id number] DOB: 1927-12-12 Today's Date: 12/20/2014   History of Present Illness  Pt admitted from [facility] independent living with SOB, wife with dementia and progressive memory decline, wife reports weakness and found patient on his knees Friday which prompted evaluation at [place] [facility] pt with Hx of C1-2 posterior fusion  Clinical Impression  Pt with flat affect, decrease cognition, command processing and mobility. Wife reporting pt normally relatively mobile and walks around independent living without difficulty. Pt with above and below deficits who will benefit from acute therapy to maximize mobility, strength, balance, gait and function to decrease burden of care and fall risk. Pt fatigued with short distance ambulation today and after walk needed to void but unable to recognize when having BM and when complete, assisted with pericare with tech and dgtr in room to assist end of session. Recommend OOB daily with nursing staff. Sats remained 96% on RA throughout session without pt report of SOB.    Follow Up Recommendations SNF;Supervision/Assistance - 24 hour    Equipment Recommendations  None recommended by PT    Recommendations for Other Services       Precautions / Restrictions Precautions Precautions: Fall      Mobility  Bed Mobility Overal bed mobility: Needs Assistance Bed Mobility: Supine to Sit     Supine to sit: Min assist     General bed mobility comments: min assist to elevate trunk and pivot legs to EOB with mod assist to scoot to EOB with max sequential cues  Transfers Overall transfer level: Needs assistance   Transfers: Sit to/from Stand;Stand Pivot Transfers Sit to Stand: Min assist;Mod assist Stand pivot transfers: Mod assist       General transfer comment: pt stood from bed initially with min assist but with transfers to chair mod assist as pt had increased difficulty  processing commands for flexing at trunk to descend to surface. Pt then stood and pivoted recliner to [facility] and back. Pt mod assist for controlling RW and self with pivot and delayed response to commands with assist for balance and sequence  Ambulation/Gait Ambulation/Gait assistance: Min assist Ambulation Distance (Feet): 15 Feet Assistive device: Rolling walker (2 wheeled) Gait Pattern/deviations: Shuffle;Trunk flexed;Narrow base of support   Gait velocity interpretation: Below normal speed for age/gender General Gait Details: cues for posture, position in RW and looking up with head and eyes. When pt attempts to look up he lifts head with decreased cervical extension causing posterior lean  Stairs            Wheelchair Mobility    Modified Rankin (Stroke Patients Only)       Balance Overall balance assessment: Needs assistance   Sitting balance-Leahy Scale: Poor   Postural control: Posterior lean Standing balance support: Bilateral upper extremity supported Standing balance-Leahy Scale: Poor                               Pertinent Vitals/Pain Pain Assessment: No/denies pain    Home Living Family/patient expects to be discharged to:: Skilled nursing facility Living Arrangements: Spouse/significant other Available Help at Discharge: Family;Available 24 hours/day Type of Home: [place] Access: Level entry     Home Layout: One level Home Equipment: Walker - 2 wheels      Prior Function Level of Independence: Independent with assistive device(s)         Comments: per wife typically bathing  and dressing on his own and use of RW for walking around [place] with supervision     Hand Dominance   Dominant Hand: Right    Extremity/Trunk Assessment   Upper Extremity Assessment: Generalized weakness           Lower Extremity Assessment: Generalized weakness      Cervical / Trunk Assessment: Kyphotic;Other exceptions   Communication   Communication: No difficulties  Cognition Arousal/Alertness: Awake/alert Behavior During Therapy: Flat affect Overall Cognitive Status: History of cognitive impairments - at baseline                      General Comments      Exercises        Assessment/Plan    PT Assessment Patient needs continued PT services  PT Diagnosis Difficulty walking;Generalized weakness;Altered mental status   PT Problem List Decreased strength;Decreased cognition;Decreased activity tolerance;Decreased balance;Decreased mobility;Decreased knowledge of use of DME  PT Treatment Interventions Gait training;DME instruction;Functional mobility training;Therapeutic activities;Therapeutic exercise;Balance training;Patient/family education;Cognitive remediation   PT Goals (Current goals can be found in the Care Plan section) Acute Rehab PT Goals Patient Stated Goal: return home PT Goal Formulation: With patient/family Time For Goal Achievement: 01/03/15 Potential to Achieve Goals: Fair    Frequency Min 3X/week   Barriers to discharge Decreased caregiver support      Co-evaluation               End of Session Equipment Utilized During Treatment: Gait belt Activity Tolerance: Patient tolerated treatment well Patient left: Other (comment);with call bell/phone within reach;with nursing/sitter in room;with family/visitor present (pt returned to [facility] with tech and dgtr in room) Nurse Communication: Mobility status;Precautions         Time: 7858-8502 PT Time Calculation (min) (ACUTE ONLY): 35 min   Charges:   PT Evaluation $Initial PT Evaluation Tier I: 1 Procedure PT Treatments $Therapeutic Activity: 8-22 mins   PT G Codes:        Melford Aase 12/20/2014, 2:39 PM Elwyn Reach, [place]

## 2014-12-20 NOTE — Evaluation (Signed)
Clinical/Bedside Swallow Evaluation Patient Details  Name: Jacob Rosales MRN: [id number] Date of Birth: 15-Jul-1928  Today's Date: 12/20/2014 Time: SLP Start Time (ACUTE ONLY): 0815 SLP Stop Time (ACUTE ONLY): 0830 SLP Time Calculation (min) (ACUTE ONLY): 15 min  Past Medical History:  Past Medical History  Diagnosis Date  . Complete AV block   . Chronic systolic heart failure   . Cardiomyopathy, ischemic   . Automatic implantable cardiac defibrillator in situ   . CAD (coronary artery disease) of artery bypass graft   . Prostate cancer     S/P radiation rx  . Osteoporosis     A/P Vertebral compression fx's  . Hypertension   . Dyslipidemia   . TIA (transient ischemic attack)     H/o  . Lumbago 10/05/2012  . Cervicalgia 04/06/2012  . 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 Unspecified vitamin D deficiency 10/07/2011  . Unspecified pruritic disorder 10/07/2011  . Rash and other nonspecific skin eruption 10/07/2011  . Basal cell carcinoma of skin of lower limb, including hip 04/08/2011  . Squamous cell carcinoma of skin of lower limb, including hip 04/08/2011  . Unspecified sinusitis (chronic) 10/08/2010  . Impotence of organic origin 10/08/2010  . Seborrhea 04/09/2010  . Insomnia, unspecified 04/09/2010  . Altered mental status 01/12/2010  . Full incontinence of feces 10/09/2009  . Atrial fibrillation 05/15/2006  . Unspecified urinary incontinence 05/15/2006  . Sebaceous cyst 10/09/2005  . Pathologic fracture of vertebrae 05/16/1991  . Fracture of C2 vertebra, closed     s/p cervical fusion 05/2013  . Osteoporosis with fracture 04/05/2013    History of vertebal fracture.   Marland Kitchen AICD (automatic cardioverter/defibrillator) present    Past Surgical History:  Past Surgical History  Procedure Laterality Date  . Coronary artery bypass graft    . Hernia repair    . Kyphosis surgery    . Biv-icd  11/21/2010    [employer] ICD Model#CD3231-40 [phone]  . Cardiac defibrillator  placement    . Cyst excision  10/2012    back Dr. Wilhemina Bonito  . Cyst removal neck  12/2012    basel cell (right) Dr. Sarajane Jews  . Colonoscopy  2006    polyps benign  . Dexa  April 2010  . Posterior cervical fusion/foraminotomy N/A 05/21/2013    Procedure: Cervical One, Cervical Two, Cervical Three Posterior cervical fusion with lateral mass fixation;  Surgeon: Charlie Pitter, MD;  Location: [place];  Service: Neurosurgery;  Laterality: N/A;  POSTERIOR CERVICAL FUSION/FORAMINOTOMY LEVEL 2   HPI:  Pt is an 81 y/o male with primary complaint of SOB and confusion. Pt unable to provide reliable history. Pt's wife reports pt's memory has demonstrated progressive memory problems in the last several months. CXR revealed worsening CHF and worseing pleural effusions.  CT revealed no acute intracranial pathology.    Assessment / Plan / Recommendation Clinical Impression  Pt was seen for swallowing evaluation. PO trials with thin and nectar thick liquids elicited delayed and immediate cough. No overt s/s of aspiration noted with purees and solid consistencies. Pt required moderate verbal and tactile cues to sustain attention to PO's. Recommend MBS to objectively evaluate swallowing function given s/s of aspiration and increased respiratory rate during PO trials. Speech will follow-up with MBS findings.     Aspiration Risk  Mild    Diet Recommendation NPO except meds   Medication Administration: Whole meds with puree Supervision: Full supervision/cueing for compensatory strategies Postural Changes and/or Swallow  Maneuvers: Seated upright 90 degrees    Other  Recommendations Recommended Consults: MBS Oral Care Recommendations: Oral care BID   Follow Up Recommendations       Frequency and Duration min 2x/week  2 weeks   Pertinent Vitals/Pain     SLP Swallow Goals     Swallow Study Prior Functional Status       General HPI: Pt is an 80 y/o male with primary complaint of SOB and confusion. Pt  unable to provide reliable history. Pt's wife reports pt's memory has demonstrated progressive memory problems in the last several months. CXR revealed worsening CHF and worseing pleural effusions.  CT revealed no acute intracranial pathology.  Type of Study: Bedside swallow evaluation Previous Swallow Assessment: None documented Diet Prior to this Study: NPO Temperature Spikes Noted: No Respiratory Status: Nasal cannula History of Recent Intubation: No Behavior/Cognition: Alert;Requires cueing;Decreased sustained attention Oral Cavity - Dentition: Adequate natural dentition Self-Feeding Abilities: Total assist Patient Positioning: Upright in bed Baseline Vocal Quality: Clear Volitional Cough: Strong    Oral/Motor/Sensory Function Overall Oral Motor/Sensory Function: Appears within functional limits for tasks assessed Labial ROM: Within Functional Limits Labial Symmetry: Within Functional Limits Lingual ROM: Within Functional Limits Lingual Symmetry: Within Functional Limits Facial ROM: Within Functional Limits Facial Symmetry: Within Functional Limits   Ice Chips Ice chips: Not tested   Thin Liquid Thin Liquid: Impaired Presentation: Cup;Spoon Oral Phase Functional Implications: Oral holding Pharyngeal  Phase Impairments: Cough - Delayed    Nectar Thick Nectar Thick Liquid: Impaired Presentation: Cup Oral phase functional implications: Oral holding Pharyngeal Phase Impairments: Cough - Immediate   Honey Thick Honey Thick Liquid: Not tested   Puree Puree: Within functional limits   Solid   GO    Solid: Within functional limits       Jacob Rosales 12/20/2014,8:39 AM

## 2014-12-21 LAB — COMPREHENSIVE METABOLIC PANEL
ALT: 199 U/L — ABNORMAL HIGH (ref 0–53)
ANION GAP: 14 (ref 5–15)
AST: 106 U/L — ABNORMAL HIGH (ref 0–37)
Albumin: 3.6 g/dL (ref 3.5–5.2)
Alkaline Phosphatase: 63 U/L (ref 39–117)
BILIRUBIN TOTAL: 1.9 mg/dL — AB (ref 0.3–1.2)
BUN: 35 mg/dL — ABNORMAL HIGH (ref 6–23)
CO2: 22 mmol/L (ref 19–32)
CREATININE: 1.47 mg/dL — AB (ref 0.50–1.35)
Calcium: 8.8 mg/dL (ref 8.4–10.5)
Chloride: 108 mmol/L (ref 96–112)
GFR calc Af Amer: 49 mL/min — ABNORMAL LOW (ref >90)
GFR, EST NON AFRICAN AMERICAN: 42 mL/min — AB (ref >90)
GLUCOSE: 105 mg/dL — AB (ref 70–99)
Potassium: 3.7 mmol/L (ref 3.5–5.1)
Sodium: 144 mmol/L (ref 135–145)
Total Protein: 6.6 g/dL (ref 6.0–8.3)

## 2014-12-21 LAB — BRAIN NATRIURETIC PEPTIDE: B NATRIURETIC PEPTIDE 5: 2091.6 pg/mL — AB (ref 0.0–100.0)

## 2014-12-21 MED ORDER — ENSURE ENLIVE PO LIQD
237.0000 mL | Freq: Two times a day (BID) | ORAL | Status: DC
  Administered 2014-12-21 – 2014-12-23 (×4): 237 mL via ORAL

## 2014-12-21 MED ORDER — FUROSEMIDE 10 MG/ML IJ SOLN
20.0000 mg | Freq: Two times a day (BID) | INTRAMUSCULAR | Status: DC
  Administered 2014-12-21 (×2): 20 mg via INTRAVENOUS
  Filled 2014-12-21 (×4): qty 2

## 2014-12-21 MED ORDER — ISOSORB DINITRATE-HYDRALAZINE 20-37.5 MG PO TABS
1.0000 | ORAL_TABLET | Freq: Two times a day (BID) | ORAL | Status: DC
  Administered 2014-12-21 – 2014-12-23 (×5): 1 via ORAL
  Filled 2014-12-21 (×6): qty 1

## 2014-12-21 NOTE — Progress Notes (Addendum)
INITIAL NUTRITION ASSESSMENT  DOCUMENTATION CODES Per approved criteria  -Severe malnutrition in the context of chronic illness   Pt meets criteria for severe MALNUTRITION in the context of chronic illness as evidenced by severe fat and muscle depletion.  INTERVENTION: -D/c Boost Plus daily -Ensure Enlive po BID, each supplement provides 350 kcal and 20 grams of protein  NUTRITION DIAGNOSIS: Malnutrition related to decreased oral intake as evidenced by severe fat and muscle depletion.   Goal: Pt will meet >90% of estimated nutritional needs  Monitor:  PO/supplement intake, labs, weight changes, I/O's  Reason for Assessment: Consult to assess needs  81 y.o. male  Admitting Dx: SOB (shortness of breath)  Jacob Rosales is a 81 y.o. male who is currently confused, only oriented to person, not able to provide reliable history, HPI obtained from talking to EDP/patient's wife and chart review.  Patient has cough and possible sob per chart, wife states this history may not be reliable due to patient's memory issue. Wife reported that she noticed patient has progressive memory problems in the last several months, wife did report that patient has progressive weakness over the last week, she found patient was on his knees Friday which prompted evaluation at [facility]. patient had a mobile xray done and was treated with abx at [facility] facility for the last three days, however patient did not get better, with increasing confusion, he was sent to [facility] ED. EMS reported patient 02sats 88% on room air, this was corrected with 2liter oxygen.   ASSESSMENT: Pt admitted with SOB, CHF exacerbation from [facility] SNF. Hx obtained by nurse tech, pt daughter, and pt at bedside. Daughter reports pt has a good appetite at baseline. He reports that he is eating better now that he has assistance with feeding. Daughter and nurse tech confirmed that they assisted him with breakfast. Pt  completed all of his breakfast tray other than 50% of oatmeal. Meal completion 25-75% since admission. Pt is sedentary at baseline, however, talks about wanting to get up and use his walker.  SLP evaluated and recommended a dysphagia 1 diet with nectar thick liquids. Noted pt with Boost supplement at bedside- pt daughter confirmed that nursing staff has been thickening liquids with powder.  Pt and pt daughter are unsure if pt has had weight loss. Per wt hx, pt wt has been stable over the past 5 years. UBW range between 145-150#.  Pt and daughter amenable to supplement regimen to maximize nutritional intake. Discussed rationale for dysphagia diet and importance of good intake to promote healing. Both expressed appreciation for visit, but denied any further nutritional needs at this time. Discharge disposition is back to [facility] once medically stable. CSW following.  Labs reviewed. BUN/Creat: 35/1.47, Glucose: 105, CBGS: 142.   Nutrition Focused Physical Exam:  Subcutaneous Fat:  Orbital Region: severe depletion Upper Arm Region: severe depletion Thoracic and Lumbar Region: severe depletion  Muscle:  Temple Region: severe depletion Clavicle Bone Region: severe depletion Clavicle and Acromion Bone Region: severe depletion Scapular Bone Region: severe depletion Dorsal Hand: severe depletion Patellar Region: severe depletion Anterior Thigh Region: severe depletion Posterior Calf Region: severe depletion  Edema: none present   Height: Ht Readings from Last 1 Encounters:  12/18/14 6\' 2"  (1.88 m)    Weight: Wt Readings from Last 1 Encounters:  12/21/14 147 lb 4.8 oz (66.815 kg)    Ideal Body Weight: 190#  % Ideal Body Weight: 77%  Wt Readings from Last 50 Encounters:  12/21/14 147  lb 4.8 oz (66.815 kg)  08/10/14 148 lb 6.4 oz (67.314 kg)  07/11/14 142 lb (64.411 kg)  06/16/14 145 lb 12.8 oz (66.134 kg)  04/27/14 146 lb 1.9 oz (66.28 kg)  04/06/14 147 lb (66.679 kg)   03/14/14 146 lb (66.225 kg)  11/03/13 143 lb (64.864 kg)  10/11/13 142 lb (64.411 kg)  09/29/13 146 lb (66.225 kg)  09/28/13 145 lb 1.9 oz (65.826 kg)  08/09/13 144 lb (65.318 kg)  07/14/13 141 lb (63.957 kg)  07/05/13 137 lb (62.143 kg)  06/21/13 135 lb 12.8 oz (61.598 kg)  06/07/13 136 lb 6.4 oz (61.871 kg)  05/24/13 140 lb 3.2 oz (63.594 kg)  05/20/13 138 lb (62.596 kg)  05/10/13 147 lb 9.6 oz (66.951 kg)  05/03/13 150 lb 9.6 oz (68.312 kg)  04/05/13 153 lb (69.4 kg)  03/16/13 153 lb (69.4 kg)  02/01/13 151 lb (68.493 kg)  01/13/13 155 lb (70.308 kg)  02/25/12 152 lb 1.9 oz (69.001 kg)  02/20/11 149 lb (67.586 kg)  11/15/10 153 lb (69.4 kg)  01/10/10 153 lb (69.4 kg)  11/14/09 155 lb (70.308 kg)  05/25/09 150 lb (68.04 kg)   Usual Body Weight: 150#  % Usual Body Weight: 102%  BMI:  Body mass index is 18.9 kg/(m^2). Normal weight range  Estimated Nutritional Needs: Kcal: 2000-2200 Protein: 90-100 grams Fluid: 2.0-2.2 L  Skin: ecchymosis  Diet Order: DIET - DYS 1 Room service appropriate?: Yes; Fluid consistency:: Nectar Thick  EDUCATION NEEDS: -Education needs addressed   Intake/Output Summary (Last 24 hours) at 12/21/14 1134 Last data filed at 12/21/14 0929  Gross per 24 hour  Intake    290 ml  Output      0 ml  Net    290 ml    Last BM: 12/20/14  Labs:   Recent Labs Lab 12/19/14 0125 12/19/14 1923 12/21/14 0736  NA 141 143 144  K 4.1 3.6 3.7  CL 106 105 108  CO2 23 26 22   BUN 48* 47* 35*  CREATININE 1.86* 1.80* 1.47*  CALCIUM 9.2 8.9 8.8  MG 2.2  --   --   GLUCOSE 117* 88 105*    CBG (last 3)   Recent Labs  12/18/14 1425  GLUCAP 142*    Scheduled Meds: . antiseptic oral rinse  7 mL Mouth Rinse BID  . carvedilol  6.25 mg Oral BID WC  . dipyridamole-aspirin  1 capsule Oral Daily  . furosemide  20 mg Intravenous Q12H  . heparin subcutaneous  5,000 Units Subcutaneous 3 times per day  . lactose free nutrition  237 mL Oral q  morning - 10a  . LORazepam  1 mg Oral QHS  . polyvinyl alcohol  1 drop Both Eyes q morning - 10a  . saccharomyces boulardii  250 mg Oral BID  . senna-docusate  2 tablet Oral Daily  . simvastatin  20 mg Oral q1800    Continuous Infusions:   Past Medical History  Diagnosis Date  . Complete AV block   . Chronic systolic heart failure   . Cardiomyopathy, ischemic   . Automatic implantable cardiac defibrillator in situ   . CAD (coronary artery disease) of artery bypass graft   . Prostate cancer     S/P radiation rx  . Osteoporosis     A/P Vertebral compression fx's  . Hypertension   . Dyslipidemia   . TIA (transient ischemic attack)     H/o  . Lumbago 10/05/2012  . Cervicalgia 04/06/2012  .  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 Unspecified vitamin D deficiency 10/07/2011  . Unspecified pruritic disorder 10/07/2011  . Rash and other nonspecific skin eruption 10/07/2011  . Basal cell carcinoma of skin of lower limb, including hip 04/08/2011  . Squamous cell carcinoma of skin of lower limb, including hip 04/08/2011  . Unspecified sinusitis (chronic) 10/08/2010  . Impotence of organic origin 10/08/2010  . Seborrhea 04/09/2010  . Insomnia, unspecified 04/09/2010  . Altered mental status 01/12/2010  . Full incontinence of feces 10/09/2009  . Atrial fibrillation 05/15/2006  . Unspecified urinary incontinence 05/15/2006  . Sebaceous cyst 10/09/2005  . Pathologic fracture of vertebrae 05/16/1991  . Fracture of C2 vertebra, closed     s/p cervical fusion 05/2013  . Osteoporosis with fracture 04/05/2013    History of vertebal fracture.   Marland Kitchen AICD (automatic cardioverter/defibrillator) present     Past Surgical History  Procedure Laterality Date  . Coronary artery bypass graft    . Hernia repair    . Kyphosis surgery    . Biv-icd  11/21/2010    [employer] ICD Model#CD3231-40 [phone]  . Cardiac defibrillator placement    . Cyst excision  10/2012    back Dr. Wilhemina Bonito  . Cyst  removal neck  12/2012    basel cell (right) Dr. Sarajane Jews  . Colonoscopy  2006    polyps benign  . Dexa  April 2010  . Posterior cervical fusion/foraminotomy N/A 05/21/2013    Procedure: Cervical One, Cervical Two, Cervical Three Posterior cervical fusion with lateral mass fixation;  Surgeon: Charlie Pitter, MD;  Location: [place];  Service: Neurosurgery;  Laterality: N/A;  POSTERIOR CERVICAL FUSION/FORAMINOTOMY LEVEL 2    Jameison Haji A. Jimmye Norman, RD, LDN, CDE Pager: [phone] After hours Pager: [phone]

## 2014-12-21 NOTE — Progress Notes (Addendum)
Patient Name: Jacob Rosales Date of Encounter: 12/21/2014  Principal Problem:   SOB (shortness of breath) Active Problems:   Acute on chronic systolic CHF (congestive heart failure), NYHA class 3   Elevated d-dimer   Confusion   Creatinine elevation   D-dimer, elevated   LFT elevation   Primary Cardiologist: Dr Lovena Le  Patient Profile: 81 y/o male with h/o CAD s/p CABG, ICM s/p ICD, CHB, chronic systolic HF, HTN, HLD, PAF, prior TIA and dementia admitted by Internal Medicine 4/17 for dyspnea, weakness and confusion.   SUBJECTIVE: Feeling well. Daughter has questions about lasix. He denies CP, SOB, orthopnea or PND.   OBJECTIVE Filed Vitals:   12/20/14 1546 12/20/14 2152 12/21/14 0654 12/21/14 0824  BP:  137/79 195/85 144/75  Pulse:  61  61  Temp:  98.4 F (36.9 C) 97.8 F (36.6 C)   TempSrc:  Oral Axillary   Resp:  22 22   Height:      Weight:    147 lb 4.8 oz (66.815 kg)  SpO2: 96% 92% 92%     Intake/Output Summary (Last 24 hours) at 12/21/14 1157 Last data filed at 12/21/14 0929  Gross per 24 hour  Intake    290 ml  Output      0 ml  Net    290 ml   Filed Weights   12/19/14 0449 12/20/14 0509 12/21/14 0824  Weight: 148 lb 4.8 oz (67.268 kg) 146 lb (66.225 kg) 147 lb 4.8 oz (66.815 kg)    PHYSICAL EXAM General: Well developed, well nourished, male in no acute distress. Head: Normocephalic, atraumatic.  Neck: Supple without bruits, JVD 8-9 cm. Lungs:  Resp regular and unlabored, mild crackles bases. Heart: RRR, S1, S2, no S3, S4, 2/6 murmur; no rub. Abdomen: Soft, non-tender, non-distended, BS + x 4.  Extremities: No clubbing, cyanosis, no edema.  Neuro: Alert and oriented X 1. Moves all extremities spontaneously.  LABS: CBC:  Recent Labs  12/18/14 1411 12/18/14 2042 12/20/14 0704  WBC 5.7  --  6.1  NEUTROABS 4.5  --   --   HGB 13.3  --  13.5  HCT 40.1 42.0 43.2  MCV 96.9  --  97.7  PLT 108*  --  125*   INR:No results for input(s): INR  in the last 72 hours. Basic Metabolic Panel:  Recent Labs  12/19/14 0125 12/19/14 1923 12/21/14 0736  NA 141 143 144  K 4.1 3.6 3.7  CL 106 105 108  CO2 23 26 22   GLUCOSE 117* 88 105*  BUN 48* 47* 35*  CREATININE 1.86* 1.80* 1.47*  CALCIUM 9.2 8.9 8.8  MG 2.2  --   --    Liver Function Tests:  Recent Labs  12/19/14 1923 12/21/14 0736  AST 199* 106*  ALT 257* 199*  ALKPHOS 65 63  BILITOT 2.1* 1.9*  PROT 6.5 6.6  ALBUMIN 3.7 3.6   Cardiac Enzymes:  Recent Labs  12/18/14 2042 12/19/14 0125 12/19/14 0755  TROPONINI 0.10* 0.11* 0.09*    BNP:  B NATRIURETIC PEPTIDE  Date/Time Value Ref Range Status  12/21/2014 07:36 AM 2091.6* 0.0 - 100.0 pg/mL Final  12/20/2014 07:04 AM 2089.9* 0.0 - 100.0 pg/mL Final   D-dimer:  Recent Labs  12/18/14 2042  DDIMER 12.30*   Anemia Panel:  Recent Labs  12/18/14 2042  VITAMINB12 1504*    TELE:  Not on   ECHO: 12/19/2014 Conclusions  - Left ventricle: Apex moves a little better  than the other walls. The cavity size was moderately dilated. The estimated ejection fraction was 25%. Diffuse hypokinesis. - Aortic valve: Sclerosis without stenosis. There was mild regurgitation. - Aorta: Mild dilitation of the aortic root. - Mitral valve: There was moderate regurgitation. - Left atrium: The atrium was moderately dilated. - Right ventricle: The cavity size was mildly dilated. Pacer wire or catheter noted in right ventricle. Systolic function was mildly reduced. - Pulmonary arteries: PA peak pressure: 52 mm Hg (S).  Radiology/Studies: Dg Chest 2 View  12/19/2014   CLINICAL DATA:  Short of breath for 1 week  EXAM: CHEST  2 VIEW  COMPARISON:  12/18/2014  FINDINGS: The heart is moderately enlarged. Pulmonary edema and vascular congestion are worse. AICD device and leads are stable. No pneumothorax. Pleural effusions are worse. Stable thoracic spine with multiple compression deformities.  IMPRESSION: Worsening CHF  and worsening pleural effusions.   Electronically Signed   By: Marybelle Killings M.D.   On: 12/19/2014 19:01   Ct Head Wo Contrast  12/19/2014   CLINICAL DATA:  Confusion  EXAM: CT HEAD WITHOUT CONTRAST  TECHNIQUE: Contiguous axial images were obtained from the base of the skull through the vertex without intravenous contrast.  COMPARISON:  01/13/2010  FINDINGS: Moderate global atrophy. Chronic ischemic changes in the periventricular white matter and bilateral basal ganglia. There is no mass effect, midline shift, or acute hemorrhage. Mastoid air cells are clear. Cranium is intact. Fusion hardware in the upper cervical spine is noted.  IMPRESSION: No acute intracranial pathology.   Electronically Signed   By: Marybelle Killings M.D.   On: 12/19/2014 18:51   US Renal  12/20/2014   CLINICAL DATA:  Elevated creatinine.  EXAM: RENAL/URINARY TRACT ULTRASOUND COMPLETE  COMPARISON:  None.  FINDINGS: Right Kidney:  Length: 10.8 cm. Cortical thinning. Echogenicity within normal limits. No mass or hydronephrosis visualized.  Left Kidney:  Length: 10.6 cm. Cortical thinning. Echogenicity within normal limits. No significant mass or hydronephrosis visualized. 3.5 cm simple cyst left lower pole.  Bladder:  The bladder is nondistended.  Foley catheter present.  IMPRESSION: Bilateral renal cortical thinning. No acute abnormality. No hydronephrosis or bladder distention. Foley catheter present.   Electronically Signed   By: Marcello Moores  Register   On: 12/20/2014 08:27   Nm Pulmonary Perf And Vent  12/19/2014   CLINICAL DATA:  81 year old male with shortness of breath.  EXAM: NUCLEAR MEDICINE VENTILATION - PERFUSION LUNG SCAN  TECHNIQUE: Ventilation images were obtained in multiple projections using inhaled aerosol technetium 99 M DTPA. Perfusion images were obtained in multiple projections after intravenous injection of Tc-35m MAA.  RADIOPHARMACEUTICALS:  40.0 mCi Tc-2m DTPA aerosol and 6.0 mCi Tc-83m MAA  COMPARISON:  12/18/2014 chest  radiograph.  FINDINGS: Ventilation: No focal ventilation defects identified. Mild central clumping is present.  Perfusion: No wedge shaped peripheral perfusion defects to suggest acute pulmonary embolism. Minimal nonsegmental patchy areas of slightly decreased profusion noted.  IMPRESSION: Very low probability of pulmonary embolus (less than 10%).   Electronically Signed   By: Margarette Canada M.D.   On: 12/19/2014 18:37   Dg Swallowing Func-speech Pathology  12/20/2014    Objective Swallowing Evaluation:   Completed by Rodena Goldmann, SLP  Student Supervised and reviewed by Herbie Baltimore [place]  Patient Details  Name: Jacob Rosales MRN: [id number] Date of Birth: 10-Jun-1928  Today's Date: 12/20/2014 Time: SLP Start Time (ACUTE ONLY): 1120-SLP Stop Time (ACUTE ONLY): 1140 SLP Time Calculation (min) (ACUTE ONLY): 20 min  Past Medical History:  Past Medical History  Diagnosis Date  . Complete AV block   . Chronic systolic heart failure   . Cardiomyopathy, ischemic   . Automatic implantable cardiac defibrillator in situ   . CAD (coronary artery disease) of artery bypass graft   . Prostate cancer     S/P radiation rx  . Osteoporosis     A/P Vertebral compression fx's  . Hypertension   . Dyslipidemia   . TIA (transient ischemic attack)     H/o  . Lumbago 10/05/2012  . Cervicalgia 04/06/2012  . 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 Unspecified vitamin D deficiency 10/07/2011  . Unspecified pruritic disorder 10/07/2011  . Rash and other nonspecific skin eruption 10/07/2011  . Basal cell carcinoma of skin of lower limb, including hip 04/08/2011  . Squamous cell carcinoma of skin of lower limb, including hip 04/08/2011  . Unspecified sinusitis (chronic) 10/08/2010  . Impotence of organic origin 10/08/2010  . Seborrhea 04/09/2010  . Insomnia, unspecified 04/09/2010  . Altered mental status 01/12/2010  . Full incontinence of feces 10/09/2009  . Atrial fibrillation 05/15/2006  . Unspecified urinary incontinence 05/15/2006  .  Sebaceous cyst 10/09/2005  . Pathologic fracture of vertebrae 05/16/1991  . Fracture of C2 vertebra, closed     s/p cervical fusion 05/2013  . Osteoporosis with fracture 04/05/2013    History of vertebal fracture.   Marland Kitchen AICD (automatic cardioverter/defibrillator) present    Past Surgical History:  Past Surgical History  Procedure Laterality Date  . Coronary artery bypass graft    . Hernia repair    . Kyphosis surgery    . Biv-icd  11/21/2010    [employer] ICD Model#CD3231-40 [phone]  . Cardiac defibrillator placement    . Cyst excision  10/2012    back Dr. Wilhemina Bonito  . Cyst removal neck  12/2012    basel cell (right) Dr. Sarajane Jews  . Colonoscopy  2006    polyps benign  . Dexa  April 2010  . Posterior cervical fusion/foraminotomy N/A 05/21/2013    Procedure: Cervical One, Cervical Two, Cervical Three Posterior cervical  fusion with lateral mass fixation;  Surgeon: Charlie Pitter, MD;  Location:  [facility];  Service: Neurosurgery;  Laterality: N/A;  POSTERIOR CERVICAL  FUSION/FORAMINOTOMY LEVEL 2   HPI:  HPI: Pt is an 81 y/o male with primary complaint of SOB and confusion. Pt  unable to provide reliable history. Pt's wife reports pt's memory has  demonstrated progressive memory problems in the last several months. CXR  revealed worsening CHF and worseing pleural effusions.  CT revealed no  acute intracranial pathology.   No Data Recorded  Assessment / Plan / Recommendation CHL IP CLINICAL IMPRESSIONS 12/20/2014  Dysphagia Diagnosis Mild oral phase dysphagia;Mild pharyngeal phase  dysphagia  Clinical impression Pt demonstrates mild oral and pharyngeal phase  dysphagia characterized by premature spillage and delayed swallow  initiation to the level of the valleculae. Noted prolonged oral holding  with liquids before swallow initiation. Flash penetration noted with thin  and nectar thick consistencies during the swallow. Increased oral residue  with nectar thick liquids. Pt still at risk for aspiration given delayed  swallow  inititation and reduced sensation. Recommend pt initiate dys 1/  thin liquid diet with full supervision to cue for strategies; slow rate;  small bites/sips; clear throat following sips of thin liquids. Speech will  follow-up for diet tolerance.       CHL IP TREATMENT RECOMMENDATION  12/20/2014  Treatment Plan Recommendations Therapy as outlined in treatment plan below      CHL IP DIET RECOMMENDATION 12/20/2014  Diet Recommendations Dysphagia 1 (Puree);Thin liquid  Liquid Administration via Cup;Straw  Medication Administration Crushed with puree  Compensations Small sips/bites;Slow rate;Clear throat after each swallow  Postural Changes and/or Swallow Maneuvers Seated upright 90 degrees     CHL IP OTHER RECOMMENDATIONS 12/20/2014  Recommended Consults (None)  Oral Care Recommendations Oral care BID  Other Recommendations (None)     No flowsheet data found.   CHL IP FREQUENCY AND DURATION 12/20/2014  Speech Therapy Frequency (ACUTE ONLY) min 2x/week  Treatment Duration 2 weeks     Pertinent Vitals/Pain NA    SLP Swallow Goals No flowsheet data found.  No flowsheet data found.    No flowsheet data found.   CHL IP ORAL PHASE 12/20/2014  Lips (None)  Tongue (None)  Mucous membranes (None)  Nutritional status (None)  Other (None)  Oxygen therapy (None)  Oral Phase Impaired  Oral - Pudding Teaspoon (None)  Oral - Pudding Cup (None)  Oral - Honey Teaspoon (None)  Oral - Honey Cup (None)  Oral - Honey Syringe (None)  Oral - Nectar Teaspoon (None)  Oral - Nectar Cup Delayed oral transit;Other (Comment)  Oral - Nectar Straw Delayed oral transit;Other (Comment)  Oral - Nectar Syringe (None)  Oral - Ice Chips (None)  Oral - Thin Teaspoon (None) Oral - Thin Cup Delayed oral transit;Other  (Comment)  Oral - Thin Straw Delayed oral transit;Other (Comment)  Oral - Thin Syringe (None)  Oral - Puree Delayed oral transit  Oral - Mechanical Soft (None)  Oral - Regular Delayed oral transit  Oral - Multi-consistency (None)  Oral - Pill (None)   Oral Phase - Comment (None)      CHL IP PHARYNGEAL PHASE 12/20/2014  Pharyngeal Phase Impaired  Pharyngeal - Pudding Teaspoon (None)  Penetration/Aspiration details (pudding teaspoon) (None)  Pharyngeal - Pudding Cup (None)  Penetration/Aspiration details (pudding cup) (None)  Pharyngeal - Honey Teaspoon (None)  Penetration/Aspiration details (honey teaspoon) (None)  Pharyngeal - Honey Cup (None)  Penetration/Aspiration details (honey cup) (None)  Pharyngeal - Honey Syringe (None)  Penetration/Aspiration details (honey syringe) (None)  Pharyngeal - Nectar Teaspoon (None)  Penetration/Aspiration details (nectar teaspoon) (None)  Pharyngeal - Nectar Cup Pharyngeal residue - valleculae;Pharyngeal residue  - pyriform sinuses;Penetration/Aspiration during swallow;Delayed swallow  initiation  Penetration/Aspiration details (nectar cup) Material enters airway,  remains ABOVE vocal cords then ejected out  Pharyngeal - Nectar Straw Pharyngeal residue - pyriform sinuses;Pharyngeal  residue - posterior pharnyx;Penetration/Aspiration during swallow;Delayed  swallow initiation  Penetration/Aspiration details (nectar straw) Material enters airway,  remains ABOVE vocal cords then ejected out  Pharyngeal - Nectar Syringe (None)  Penetration/Aspiration details (nectar syringe) (None)  Pharyngeal - Ice Chips (None)  Penetration/Aspiration details (ice chips) (None)  Pharyngeal - Thin Teaspoon (None)  Penetration/Aspiration details (thin teaspoon) (None)  Pharyngeal - Thin Cup Penetration/Aspiration during swallow;Delayed  swallow initiation  Penetration/Aspiration details (thin cup) Material enters airway, remains  ABOVE vocal cords then ejected out  Pharyngeal - Thin Straw Penetration/Aspiration during swallow;Delayed  swallow initiation  Penetration/Aspiration details (thin straw) Material enters airway,  remains ABOVE vocal cords then ejected out  Pharyngeal - Thin Syringe (None)  Penetration/Aspiration details (thin syringe')  (None)  Pharyngeal - Puree Delayed swallow initiation  Penetration/Aspiration details (puree) (None)  Pharyngeal - Mechanical Soft (None)  Penetration/Aspiration details (mechanical soft) (None)  Pharyngeal - Regular Delayed swallow initiation  Penetration/Aspiration details (  regular) (None)  Pharyngeal - Multi-consistency (None)  Penetration/Aspiration details (multi-consistency) (None)  Pharyngeal - Pill (None)  Penetration/Aspiration details (pill) (None)  Pharyngeal Comment (None)     CHL IP CERVICAL ESOPHAGEAL PHASE 12/20/2014  Cervical Esophageal Phase WFL  Pudding Teaspoon (None)  Pudding Cup (None)  Honey Teaspoon (None)  Honey Cup (None)  Honey Syringe (None)  Nectar Teaspoon (None)  Nectar Cup (None)  Nectar Straw (None)  Nectar Syringe (None)  Thin Teaspoon (None)  Thin Cup (None)  Thin Straw (None)  Thin Syringe (None)  Cervical Esophageal Comment (None)    No flowsheet data found.        Herbie Baltimore, [place] CCC-SLP [phone]  [place], Katherene Ponto 12/20/2014, 1:36 PM      Current Medications:  . antiseptic oral rinse  7 mL Mouth Rinse BID  . carvedilol  6.25 mg Oral BID WC  . dipyridamole-aspirin  1 capsule Oral Daily  . furosemide  20 mg Intravenous Q12H  . heparin subcutaneous  5,000 Units Subcutaneous 3 times per day  . lactose free nutrition  237 mL Oral q morning - 10a  . LORazepam  1 mg Oral QHS  . polyvinyl alcohol  1 drop Both Eyes q morning - 10a  . saccharomyces boulardii  250 mg Oral BID  . senna-docusate  2 tablet Oral Daily  . simvastatin  20 mg Oral q1800      ASSESSMENT AND PLAN: 1. Acute on Chronic Systolic CHF:   - EF was 05% by echo 04/18, unchanged.  - He received 120 mg total of IV Lasix 04/17 & 18.  - I/Os net negative 2.3L and weight is down 7lbs.  - BNP has not improved and he may have very slight crackles at lung bases. With rising SCr from 1.78-->1.86 (baseline ~1.4), IV lasix d/c'd yesterday. Creat improved today at 1.47 and placed back on 20mg  IV Lasix  bid by IM. This can likely be changed to PO tomorrow AM. - Continue Coreg. Hold ACE-I given ARI. BB increased yesterday from 3.125mg  BID to 6.25mg  BID. BP has room so will add hydralazine + nitrate in place of ACE-I.  I added Bidil BID.  - Continue low sodium diet, strict I/Os and daily weights to monitor volume status.   2. Elevated Troponin:  - minimal elevation with flat trend.  - He has h/o CAD but denies CP.  - EKG w/o ischemia.  - 2D echo reviewed, EF unchanged and no sig WMA - continue medical Rx    3. CAD: h/o CABG. No chest pain. Continue medical therapy.   4. Elevated D-dimer: V/Q scan w/ very low prob PE. Per IM     Signed, Eileen Stanford , PA-C 11:57 AM 12/21/2014  Patinet seen and examined  I agree with findings as noted by Kathlene November  Volume status is not too bad.   Continue medical Rx as noted above  Dorris Carnes

## 2014-12-21 NOTE — Progress Notes (Signed)
[facility] PROGRESS NOTE  Jacob Rosales DGU:[id number] DOB: 1928-02-25 DOA: 12/18/2014 PCP: Hollace Kinnier, DO  Assessment/Plan: 1. Acute on chronic Systolic CHF -EF of 95% unchanged -resume Iv lasix today at low dose, creatinine improved -continue coreg, started bidil per Cards -weight down 7lbs -cards following -wean O2  2. AKI on CKD -improving, ACE held, resume lasix -baseline creatinine 1.4-1.5  3. Elevated d  Dimer -non specific, low prob VQ scan and negative dopplers  4. Dementia -stable  5. Elevated troponin -non ACS pattern, Cards following, ECHo with EF unchanged and no further workup per Cards -continue aggrenox/coreg/statin  6. H/o afib, AV block and pacer -allergic to warfarin, on aggrenox chroncially, continued  7. Elevated LFTS -likley due to passive hepatic congestion, GB wall thickening noted on [place] likely due to edematous state, no Abd pain, no N/V/leukocytsosis, no murphys sign  Code Status: DNR Family Communication: daughter (indicate person spoken with, relationship, and if by phone, the number) Disposition Plan: SNf in 1-2days   Consultants:  Cards  HPI/Subjective: Starting to feel better, breathing ok  Objective: Filed Vitals:   12/21/14 0824  BP: 144/75  Pulse: 61  Temp:   Resp:     Intake/Output Summary (Last 24 hours) at 12/21/14 1114 Last data filed at 12/21/14 0929  Gross per 24 hour  Intake    290 ml  Output      0 ml  Net    290 ml   Filed Weights   12/19/14 0449 12/20/14 0509 12/21/14 0824  Weight: 67.268 kg (148 lb 4.8 oz) 66.225 kg (146 lb) 66.815 kg (147 lb 4.8 oz)    Exam:   General:  AAOx3, no distress  Cardiovascular: G3O7/FIE, systolic murmur  Respiratory: basilar crackles  Abdomen: soft, Nt, BS rpesent  Musculoskeletal: trace edema   Data Reviewed: Basic Metabolic Panel:  Recent Labs Lab 12/18/14 1411 12/19/14 0125 12/19/14 1923 12/21/14 0736  NA 138 141 143 144  K 4.4 4.1 3.6 3.7   CL 106 106 105 108  CO2 19 23 26 22   GLUCOSE 134* 117* 88 105*  BUN 49* 48* 47* 35*  CREATININE 1.78* 1.86* 1.80* 1.47*  CALCIUM 9.2 9.2 8.9 8.8  MG  --  2.2  --   --    Liver Function Tests:  Recent Labs Lab 12/18/14 1411 12/19/14 0125 12/19/14 1923 12/21/14 0736  AST 236* 200* 199* 106*  ALT 208* 220* 257* 199*  ALKPHOS 72 64 65 63  BILITOT 1.8* 1.9* 2.1* 1.9*  PROT 6.6 6.7 6.5 6.6  ALBUMIN 3.9 3.9 3.7 3.6   No results for input(s): LIPASE, AMYLASE in the last 168 hours.  Recent Labs Lab 12/18/14 2042  AMMONIA 20   CBC:  Recent Labs Lab 12/18/14 1411 12/18/14 2042 12/20/14 0704  WBC 5.7  --  6.1  NEUTROABS 4.5  --   --   HGB 13.3  --  13.5  HCT 40.1 42.0 43.2  MCV 96.9  --  97.7  PLT 108*  --  125*   Cardiac Enzymes:  Recent Labs Lab 12/18/14 2042 12/19/14 0125 12/19/14 0755  TROPONINI 0.10* 0.11* 0.09*   BNP (last 3 results)  Recent Labs  12/19/14 0125 12/20/14 0704 12/21/14 0736  BNP 3590.3* 2089.9* 2091.6*    ProBNP (last 3 results)  Recent Labs  04/27/14 1539 06/16/14 1440  PROBNP 1024.0* 982.0*    CBG:  Recent Labs Lab 12/18/14 1425  GLUCAP 142*    Recent Results (from the past 240  hour(s))  Blood culture (routine x 2)     Status: None (Preliminary result)   Collection Time: 12/18/14  2:10 PM  Result Value Ref Range Status   Specimen Description BLOOD RIGHT HAND  Final   Special Requests BOTTLES DRAWN AEROBIC ONLY 10CC  Final   Culture   Final           BLOOD CULTURE RECEIVED NO GROWTH TO DATE CULTURE WILL BE HELD FOR 5 DAYS BEFORE ISSUING A FINAL NEGATIVE REPORT Performed at [facility]    Report Status PENDING  Incomplete  Culture, blood (routine x 2)     Status: None (Preliminary result)   Collection Time: 12/18/14  2:27 PM  Result Value Ref Range Status   Specimen Description BLOOD RIGHT ANTECUBITAL  Final   Special Requests BOTTLES DRAWN AEROBIC ONLY 10CC  Final   Culture   Final           BLOOD  CULTURE RECEIVED NO GROWTH TO DATE CULTURE WILL BE HELD FOR 5 DAYS BEFORE ISSUING A FINAL NEGATIVE REPORT Performed at [facility]    Report Status PENDING  Incomplete     Studies: Dg Chest 2 View  12/19/2014   CLINICAL DATA:  Short of breath for 1 week  EXAM: CHEST  2 VIEW  COMPARISON:  12/18/2014  FINDINGS: The heart is moderately enlarged. Pulmonary edema and vascular congestion are worse. AICD device and leads are stable. No pneumothorax. Pleural effusions are worse. Stable thoracic spine with multiple compression deformities.  IMPRESSION: Worsening CHF and worsening pleural effusions.   Electronically Signed   By: Marybelle Killings M.D.   On: 12/19/2014 19:01   Ct Head Wo Contrast  12/19/2014   CLINICAL DATA:  Confusion  EXAM: CT HEAD WITHOUT CONTRAST  TECHNIQUE: Contiguous axial images were obtained from the base of the skull through the vertex without intravenous contrast.  COMPARISON:  01/13/2010  FINDINGS: Moderate global atrophy. Chronic ischemic changes in the periventricular white matter and bilateral basal ganglia. There is no mass effect, midline shift, or acute hemorrhage. Mastoid air cells are clear. Cranium is intact. Fusion hardware in the upper cervical spine is noted.  IMPRESSION: No acute intracranial pathology.   Electronically Signed   By: Marybelle Killings M.D.   On: 12/19/2014 18:51   US Renal  12/20/2014   CLINICAL DATA:  Elevated creatinine.  EXAM: RENAL/URINARY TRACT ULTRASOUND COMPLETE  COMPARISON:  None.  FINDINGS: Right Kidney:  Length: 10.8 cm. Cortical thinning. Echogenicity within normal limits. No mass or hydronephrosis visualized.  Left Kidney:  Length: 10.6 cm. Cortical thinning. Echogenicity within normal limits. No significant mass or hydronephrosis visualized. 3.5 cm simple cyst left lower pole.  Bladder:  The bladder is nondistended.  Foley catheter present.  IMPRESSION: Bilateral renal cortical thinning. No acute abnormality. No hydronephrosis or bladder  distention. Foley catheter present.   Electronically Signed   By: Marcello Moores  Register   On: 12/20/2014 08:27   Nm Pulmonary Perf And Vent  12/19/2014   CLINICAL DATA:  81 year old male with shortness of breath.  EXAM: NUCLEAR MEDICINE VENTILATION - PERFUSION LUNG SCAN  TECHNIQUE: Ventilation images were obtained in multiple projections using inhaled aerosol technetium 99 M DTPA. Perfusion images were obtained in multiple projections after intravenous injection of Tc-76m MAA.  RADIOPHARMACEUTICALS:  40.0 mCi Tc-68m DTPA aerosol and 6.0 mCi Tc-50m MAA  COMPARISON:  12/18/2014 chest radiograph.  FINDINGS: Ventilation: No focal ventilation defects identified. Mild central clumping is present.  Perfusion: No wedge  shaped peripheral perfusion defects to suggest acute pulmonary embolism. Minimal nonsegmental patchy areas of slightly decreased profusion noted.  IMPRESSION: Very low probability of pulmonary embolus (less than 10%).   Electronically Signed   By: Margarette Canada M.D.   On: 12/19/2014 18:37   Dg Swallowing Func-speech Pathology  12/20/2014    Objective Swallowing Evaluation:   Completed by Rodena Goldmann, SLP  Student Supervised and reviewed by Herbie Baltimore [place] CCC-SLP  Patient Details  Name: Jacob Rosales MRN: [id number] Date of Birth: 10/06/1927  Today's Date: 12/20/2014 Time: SLP Start Time (ACUTE ONLY): 1120-SLP Stop Time (ACUTE ONLY): 1140 SLP Time Calculation (min) (ACUTE ONLY): 20 min  Past Medical History:  Past Medical History  Diagnosis Date  . Complete AV block   . Chronic systolic heart failure   . Cardiomyopathy, ischemic   . Automatic implantable cardiac defibrillator in situ   . CAD (coronary artery disease) of artery bypass graft   . Prostate cancer     S/P radiation rx  . Osteoporosis     A/P Vertebral compression fx's  . Hypertension   . Dyslipidemia   . TIA (transient ischemic attack)     H/o  . Lumbago 10/05/2012  . Cervicalgia 04/06/2012  . 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 Unspecified vitamin D deficiency 10/07/2011  . Unspecified pruritic disorder 10/07/2011  . Rash and other nonspecific skin eruption 10/07/2011  . Basal cell carcinoma of skin of lower limb, including hip 04/08/2011  . Squamous cell carcinoma of skin of lower limb, including hip 04/08/2011  . Unspecified sinusitis (chronic) 10/08/2010  . Impotence of organic origin 10/08/2010  . Seborrhea 04/09/2010  . Insomnia, unspecified 04/09/2010  . Altered mental status 01/12/2010  . Full incontinence of feces 10/09/2009  . Atrial fibrillation 05/15/2006  . Unspecified urinary incontinence 05/15/2006  . Sebaceous cyst 10/09/2005  . Pathologic fracture of vertebrae 05/16/1991  . Fracture of C2 vertebra, closed     s/p cervical fusion 05/2013  . Osteoporosis with fracture 04/05/2013    History of vertebal fracture.   Marland Kitchen AICD (automatic cardioverter/defibrillator) present    Past Surgical History:  Past Surgical History  Procedure Laterality Date  . Coronary artery bypass graft    . Hernia repair    . Kyphosis surgery    . Biv-icd  11/21/2010    [employer] ICD Model#[id number] [phone]  . Cardiac defibrillator placement    . Cyst excision  10/2012    back Dr. Wilhemina Bonito  . Cyst removal neck  12/2012    basel cell (right) Dr. Sarajane Jews  . Colonoscopy  2006    polyps benign  . Dexa  April 2010  . Posterior cervical fusion/foraminotomy N/A 05/21/2013    Procedure: Cervical One, Cervical Two, Cervical Three Posterior cervical  fusion with lateral mass fixation;  Surgeon: Charlie Pitter, MD;  Location:  [facility];  Service: Neurosurgery;  Laterality: N/A;  POSTERIOR CERVICAL  FUSION/FORAMINOTOMY LEVEL 2   HPI:  HPI: Pt is an 81 y/o male with primary complaint of SOB and confusion. Pt  unable to provide reliable history. Pt's wife reports pt's memory has  demonstrated progressive memory problems in the last several months. CXR  revealed worsening CHF and worseing pleural effusions.  CT revealed no  acute intracranial pathology.   No Data Recorded   Assessment / Plan / Recommendation CHL IP CLINICAL IMPRESSIONS 12/20/2014  Dysphagia Diagnosis Mild oral phase dysphagia;Mild pharyngeal phase  dysphagia  Clinical impression Pt demonstrates mild oral and pharyngeal phase  dysphagia characterized by premature spillage and delayed swallow  initiation to the level of the valleculae. Noted prolonged oral holding  with liquids before swallow initiation. Flash penetration noted with thin  and nectar thick consistencies during the swallow. Increased oral residue  with nectar thick liquids. Pt still at risk for aspiration given delayed  swallow inititation and reduced sensation. Recommend pt initiate dys 1/  thin liquid diet with full supervision to cue for strategies; slow rate;  small bites/sips; clear throat following sips of thin liquids. Speech will  follow-up for diet tolerance.       CHL IP TREATMENT RECOMMENDATION 12/20/2014  Treatment Plan Recommendations Therapy as outlined in treatment plan below      CHL IP DIET RECOMMENDATION 12/20/2014  Diet Recommendations Dysphagia 1 (Puree);Thin liquid  Liquid Administration via Cup;Straw  Medication Administration Crushed with puree  Compensations Small sips/bites;Slow rate;Clear throat after each swallow  Postural Changes and/or Swallow Maneuvers Seated upright 90 degrees     CHL IP OTHER RECOMMENDATIONS 12/20/2014  Recommended Consults (None)  Oral Care Recommendations Oral care BID  Other Recommendations (None)     No flowsheet data found.   CHL IP FREQUENCY AND DURATION 12/20/2014  Speech Therapy Frequency (ACUTE ONLY) min 2x/week  Treatment Duration 2 weeks     Pertinent Vitals/Pain NA    SLP Swallow Goals No flowsheet data found.  No flowsheet data found.    No flowsheet data found.   CHL IP ORAL PHASE 12/20/2014  Lips (None)  Tongue (None)  Mucous membranes (None)  Nutritional status (None)  Other (None)  Oxygen therapy (None)  Oral Phase Impaired  Oral - Pudding Teaspoon (None)  Oral - Pudding Cup (None)  Oral - Honey  Teaspoon (None)  Oral - Honey Cup (None)  Oral - Honey Syringe (None)  Oral - Nectar Teaspoon (None)  Oral - Nectar Cup Delayed oral transit;Other (Comment)  Oral - Nectar Straw Delayed oral transit;Other (Comment)  Oral - Nectar Syringe (None)  Oral - Ice Chips (None)  Oral - Thin Teaspoon (None) Oral - Thin Cup Delayed oral transit;Other  (Comment)  Oral - Thin Straw Delayed oral transit;Other (Comment)  Oral - Thin Syringe (None)  Oral - Puree Delayed oral transit  Oral - Mechanical Soft (None)  Oral - Regular Delayed oral transit  Oral - Multi-consistency (None)  Oral - Pill (None)  Oral Phase - Comment (None)      CHL IP PHARYNGEAL PHASE 12/20/2014  Pharyngeal Phase Impaired  Pharyngeal - Pudding Teaspoon (None)  Penetration/Aspiration details (pudding teaspoon) (None)  Pharyngeal - Pudding Cup (None)  Penetration/Aspiration details (pudding cup) (None)  Pharyngeal - Honey Teaspoon (None)  Penetration/Aspiration details (honey teaspoon) (None)  Pharyngeal - Honey Cup (None)  Penetration/Aspiration details (honey cup) (None)  Pharyngeal - Honey Syringe (None)  Penetration/Aspiration details (honey syringe) (None)  Pharyngeal - Nectar Teaspoon (None)  Penetration/Aspiration details (nectar teaspoon) (None)  Pharyngeal - Nectar Cup Pharyngeal residue - valleculae;Pharyngeal residue  - pyriform sinuses;Penetration/Aspiration during swallow;Delayed swallow  initiation  Penetration/Aspiration details (nectar cup) Material enters airway,  remains ABOVE vocal cords then ejected out  Pharyngeal - Nectar Straw Pharyngeal residue - pyriform sinuses;Pharyngeal  residue - posterior pharnyx;Penetration/Aspiration during swallow;Delayed  swallow initiation  Penetration/Aspiration details (nectar straw) Material enters airway,  remains ABOVE vocal cords then ejected out  Pharyngeal - Nectar Syringe (None)  Penetration/Aspiration details (nectar syringe) (None)  Pharyngeal - Ice Chips (None)  Penetration/Aspiration details (  ice  chips) (None)  Pharyngeal - Thin Teaspoon (None)  Penetration/Aspiration details (thin teaspoon) (None)  Pharyngeal - Thin Cup Penetration/Aspiration during swallow;Delayed  swallow initiation  Penetration/Aspiration details (thin cup) Material enters airway, remains  ABOVE vocal cords then ejected out  Pharyngeal - Thin Straw Penetration/Aspiration during swallow;Delayed  swallow initiation  Penetration/Aspiration details (thin straw) Material enters airway,  remains ABOVE vocal cords then ejected out  Pharyngeal - Thin Syringe (None)  Penetration/Aspiration details (thin syringe') (None)  Pharyngeal - Puree Delayed swallow initiation  Penetration/Aspiration details (puree) (None)  Pharyngeal - Mechanical Soft (None)  Penetration/Aspiration details (mechanical soft) (None)  Pharyngeal - Regular Delayed swallow initiation  Penetration/Aspiration details (regular) (None)  Pharyngeal - Multi-consistency (None)  Penetration/Aspiration details (multi-consistency) (None)  Pharyngeal - Pill (None)  Penetration/Aspiration details (pill) (None)  Pharyngeal Comment (None)     CHL IP CERVICAL ESOPHAGEAL PHASE 12/20/2014  Cervical Esophageal Phase WFL  Pudding Teaspoon (None)  Pudding Cup (None)  Honey Teaspoon (None)  Honey Cup (None)  Honey Syringe (None)  Nectar Teaspoon (None)  Nectar Cup (None)  Nectar Straw (None)  Nectar Syringe (None)  Thin Teaspoon (None)  Thin Cup (None)  Thin Straw (None)  Thin Syringe (None)  Cervical Esophageal Comment (None)    No flowsheet data found.        Herbie Baltimore, [place] CCC-SLP [phone]  [place], Katherene Ponto 12/20/2014, 1:36 PM     Scheduled Meds: . antiseptic oral rinse  7 mL Mouth Rinse BID  . carvedilol  6.25 mg Oral BID WC  . dipyridamole-aspirin  1 capsule Oral Daily  . heparin subcutaneous  5,000 Units Subcutaneous 3 times per day  . lactose free nutrition  237 mL Oral q morning - 10a  . LORazepam  1 mg Oral QHS  . polyvinyl alcohol  1 drop Both Eyes q morning - 10a  .  saccharomyces boulardii  250 mg Oral BID  . senna-docusate  2 tablet Oral Daily  . simvastatin  20 mg Oral q1800   Continuous Infusions:  Antibiotics Given (last 72 hours)    None      Principal Problem:   SOB (shortness of breath) Active Problems:   Acute on chronic systolic CHF (congestive heart failure), NYHA class 3   Elevated d-dimer   Confusion   Creatinine elevation   D-dimer, elevated   LFT elevation    Time spent: 62min    [facility] Pager [phone]. If 7PM-7AM, please contact night-coverage at [email or web address], password [facility] 12/21/2014, 11:14 AM  LOS: 3 days

## 2014-12-21 NOTE — Progress Notes (Signed)
Speech Language Pathology Treatment: Dysphagia  Patient Details Name: Jacob Rosales MRN: [id number] DOB: April 26, 1928 Today's Date: 12/21/2014 Time: 0943-1000 SLP Time Calculation (min) (ACUTE ONLY): 17 min  Assessment / Plan / Recommendation Clinical Impression  Pt seen for f/u after MBS completed yesterday, with recommendations for Dys 1 diet and thin liquids. Per chart review, pt's diet has been further modified to Dys 1 diet and nectar thick liquids. Sitter reports coughing with liquids. SLP provided skilled observation of nectar thick liquid trials this morning. Pt required Max cues to utilize compensatory strategies. Daughter present and educated on rationale for utilizing strategies. Pt okay to continue with current diet order. Will follow for readiness to advance back to thin liquids, likely as cognition continues to improve.   HPI HPI: Pt is an 81 y/o male with primary complaint of SOB and confusion. Pt unable to provide reliable history. Pt's wife reports pt's memory has demonstrated progressive memory problems in the last several months. CXR revealed worsening CHF and worseing pleural effusions.  CT revealed no acute intracranial pathology.    Pertinent Vitals Pain Assessment: No/denies pain  SLP Plan  Continue with current plan of care    Recommendations Diet recommendations: Dysphagia 1 (puree);Nectar-thick liquid Liquids provided via: Cup Medication Administration: Crushed with puree Supervision: Full supervision/cueing for compensatory strategies Compensations: Small sips/bites;Slow rate;Clear throat after each swallow Postural Changes and/or Swallow Maneuvers: Seated upright 90 degrees       Oral Care Recommendations: Oral care BID Follow up Recommendations: Skilled Nursing facility Plan: Continue with current plan of care   Germain Osgood, M.A. CCC-SLP [phone]  Germain Osgood 12/21/2014, 10:07 AM

## 2014-12-22 ENCOUNTER — Inpatient Hospital Stay (HOSPITAL_COMMUNITY): Payer: Medicare Other

## 2014-12-22 ENCOUNTER — Telehealth: Payer: Self-pay

## 2014-12-22 DIAGNOSIS — E43 Unspecified severe protein-calorie malnutrition: Secondary | ICD-10-CM | POA: Insufficient documentation

## 2014-12-22 LAB — BASIC METABOLIC PANEL
Anion gap: 13 (ref 5–15)
BUN: 29 mg/dL — ABNORMAL HIGH (ref 6–23)
CO2: 24 mmol/L (ref 19–32)
CREATININE: 1.31 mg/dL (ref 0.50–1.35)
Calcium: 9.3 mg/dL (ref 8.4–10.5)
Chloride: 108 mmol/L (ref 96–112)
GFR calc non Af Amer: 48 mL/min — ABNORMAL LOW (ref >90)
GFR, EST AFRICAN AMERICAN: 56 mL/min — AB (ref >90)
GLUCOSE: 105 mg/dL — AB (ref 70–99)
Potassium: 3.6 mmol/L (ref 3.5–5.1)
Sodium: 145 mmol/L (ref 135–145)

## 2014-12-22 MED ORDER — FUROSEMIDE 40 MG PO TABS
40.0000 mg | ORAL_TABLET | Freq: Every day | ORAL | Status: DC
  Administered 2014-12-22 – 2014-12-23 (×2): 40 mg via ORAL
  Filled 2014-12-22 (×2): qty 1

## 2014-12-22 MED ORDER — HALOPERIDOL LACTATE 5 MG/ML IJ SOLN
1.0000 mg | Freq: Once | INTRAMUSCULAR | Status: AC
Start: 2014-12-22 — End: 2014-12-22
  Administered 2014-12-22: 1 mg via INTRAVENOUS
  Filled 2014-12-22: qty 1

## 2014-12-22 NOTE — Progress Notes (Signed)
Pt with increased agitation. Frequently trying to get up out of bed. On call provider notified, Haldol 1mg  ordered.

## 2014-12-22 NOTE — Progress Notes (Signed)
Physical Therapy Treatment Patient Details Name: Jacob Rosales MRN: [id number] DOB: 1928-01-30 Today's Date: 12/22/2014    History of Present Illness Pt admitted from [facility] independent living with SOB, wife with dementia and progressive memory decline, wife reports weakness and found patient on his knees Friday which prompted evaluation at [place] clinic pt with Hx of C1-2 posterior fusion    PT Comments    Patient making slow progress with mobility/gait.  Patient with increased confusion today.  Follow Up Recommendations  SNF;Supervision/Assistance - 24 hour     Equipment Recommendations  None recommended by PT    Recommendations for Other Services       Precautions / Restrictions Precautions Precautions: Fall Restrictions Weight Bearing Restrictions: No    Mobility  Bed Mobility                  Transfers Overall transfer level: Needs assistance Equipment used: Rolling walker (2 wheeled) Transfers: Sit to/from Stand Sit to Stand: Mod assist         General transfer comment: Verbal cues for hand placement and to scoot to edge of chair.  Assist to power up to standing and for balance.  Patient with posterior lean and flexed posture.  Ambulation/Gait Ambulation/Gait assistance: Mod assist Ambulation Distance (Feet): 32 Feet Assistive device: Rolling walker (2 wheeled) Gait Pattern/deviations: Step-through pattern;Decreased step length - right;Decreased step length - left;Shuffle;Leaning posteriorly;Trunk flexed Gait velocity: Very slow Gait velocity interpretation: Below normal speed for age/gender General Gait Details: Verbal cues for safe use of RW.  Patient with flexed posture and head downward.  Cues to try to stand upright.  Patient taking very short, shuffling steps - at times just stepping in place rather than moving forward.  Verbal cues to take longer steps.  Patient does so for 1-2 steps, and then is back to very short steps.  Increased time  for ambulation.  Requires assist to maneuver RW around obstacles and for balance.   Stairs            Wheelchair Mobility    Modified Rankin (Stroke Patients Only)       Balance Overall balance assessment: Needs assistance         Standing balance support: Bilateral upper extremity supported Standing balance-Leahy Scale: Poor                      Cognition Arousal/Alertness: Lethargic;Suspect due to medications (Meds for agitation) Behavior During Therapy: Flat affect Overall Cognitive Status: Impaired/Different from baseline Area of Impairment: Orientation;Attention;Memory;Following commands;Safety/judgement;Problem solving Orientation Level: Disoriented to;Time;Situation Current Attention Level: Focused Memory: Decreased short-term memory Following Commands: Follows one step commands inconsistently;Follows one step commands with increased time Safety/Judgement: Decreased awareness of safety;Decreased awareness of deficits   Problem Solving: Slow processing;Difficulty sequencing;Requires verbal cues;Requires tactile cues General Comments: Per daughter, patient more confused.    Exercises      General Comments        Pertinent Vitals/Pain Pain Assessment: No/denies pain    Home Living                      Prior Function            PT Goals (current goals can now be found in the care plan section) Progress towards PT goals: Progressing toward goals    Frequency  Min 3X/week    PT Plan Current plan remains appropriate    Co-evaluation  End of Session Equipment Utilized During Treatment: Gait belt Activity Tolerance: Patient limited by fatigue Patient left: in chair;with call bell/phone within reach;with chair alarm set;with nursing/sitter in room;with family/visitor present     Time: 0211-1735 PT Time Calculation (min) (ACUTE ONLY): 23 min  Charges:  $Gait Training: 23-37 mins                    G Codes:       Despina Pole 01/12/2015, 4:15 PM Carita Pian. Sanjuana Kava, [place] Pager [phone]

## 2014-12-22 NOTE — Clinical Social Work Note (Signed)
CSW met with patient and patient's daughter at bedside. Both state that they do not want the patient to be unattended at any time and request to have a sitter again. CSW has notified charge RN of situation. The family plans to pay for a sitter at [employer] CSW has notified the facility [facility]) and has made the family aware of the need to contact [facility] at [phone] to make arrangements for sitter.    Liz Beach MSW, [place], [place], [phone]

## 2014-12-22 NOTE — Telephone Encounter (Signed)
Mrs. Amescua called to discuss the Forteo. She thinks that we need to just not worry about him taking it right now. He's in the hospital now. When he was taking the Forteo, he would forget it unless she would remind him and at times when she did, he just didn't want to take it, "I'll do it tomorrow". Also, she is having trouble getting records from previous doctors office on the [employer] so the Insurance will pay for the Forteo. Told Mrs. Leonetti that we will wait until he is better and the doctor can talk with them at the next office visit. She agreed that would be better.

## 2014-12-22 NOTE — Progress Notes (Signed)
Patient Name: Jacob Rosales Date of Encounter: 12/22/2014  Principal Problem:   SOB (shortness of breath) Active Problems:   Acute on chronic systolic CHF (congestive heart failure), NYHA class 3   Elevated d-dimer   Confusion   Creatinine elevation   D-dimer, elevated   LFT elevation   Primary Cardiologist: Dr Lovena Le  Patient Profile: 81 y/o male with h/o CAD s/p CABG, ICM s/p ICD, CHB, chronic systolic HF, HTN, HLD, PAF, prior TIA and dementia admitted by Internal Medicine 4/17 for dyspnea, weakness and confusion.   SUBJECTIVE:  Mildly confused this morning. Denies CP or SOB.    OBJECTIVE Filed Vitals:   12/21/14 2112 12/22/14 0540 12/22/14 0542 12/22/14 0551  BP: 135/71 162/78  152/76  Pulse: 63 85    Temp: 98.8 F (37.1 C) 97.8 F (36.6 C)    TempSrc: Oral Oral    Resp: 16 28    Height:      Weight:   139 lb 8.8 oz (63.3 kg)   SpO2: 97% 94%      Intake/Output Summary (Last 24 hours) at 12/22/14 0802 Last data filed at 12/22/14 0232  Gross per 24 hour  Intake    360 ml  Output   2250 ml  Net  -1890 ml   Filed Weights   12/20/14 0509 12/21/14 0824 12/22/14 0542  Weight: 146 lb (66.225 kg) 147 lb 4.8 oz (66.815 kg) 139 lb 8.8 oz (63.3 kg)    PHYSICAL EXAM General: Well developed, well nourished, male in no acute distress. Thin and frail. confused Head: Normocephalic, atraumatic.  Neck: Supple without bruits, no JVD . Lungs:  Resp regular and unlabored, Heart: RRR, S1, S2, no S3, S4, 2/6 murmur; no rub. Abdomen: Soft, non-tender, non-distended, BS + x 4.  Extremities: No clubbing, cyanosis, no edema.  Neuro: Alert and oriented X 1. Moves all extremities spontaneously.  LABS: CBC:  Recent Labs  12/20/14 0704  WBC 6.1  HGB 13.5  HCT 43.2  MCV 97.7  PLT 125*   INR:No results for input(s): INR in the last 72 hours. Basic Metabolic Panel:  Recent Labs  12/19/14 1923 12/21/14 0736  NA 143 144  K 3.6 3.7  CL 105 108  CO2 26 22  GLUCOSE  88 105*  BUN 47* 35*  CREATININE 1.80* 1.47*  CALCIUM 8.9 8.8   Liver Function Tests:  Recent Labs  12/19/14 1923 12/21/14 0736  AST 199* 106*  ALT 257* 199*  ALKPHOS 65 63  BILITOT 2.1* 1.9*  PROT 6.5 6.6  ALBUMIN 3.7 3.6    BNP:  B NATRIURETIC PEPTIDE  Date/Time Value Ref Range Status  12/21/2014 07:36 AM 2091.6* 0.0 - 100.0 pg/mL Final  12/20/2014 07:04 AM 2089.9* 0.0 - 100.0 pg/mL Final    TELE:  Not on tele  ECHO: 12/19/2014 Conclusions  - Left ventricle: Apex moves a little better than the other walls. The cavity size was moderately dilated. The estimated ejection fraction was 25%. Diffuse hypokinesis. - Aortic valve: Sclerosis without stenosis. There was mild regurgitation. - Aorta: Mild dilitation of the aortic root. - Mitral valve: There was moderate regurgitation. - Left atrium: The atrium was moderately dilated. - Right ventricle: The cavity size was mildly dilated. Pacer wire or catheter noted in right ventricle. Systolic function was mildly reduced. - Pulmonary arteries: PA peak pressure: 52 mm Hg (S).  Radiology/Studies: Dg Swallowing Func-speech Pathology  12/20/2014    Objective Swallowing Evaluation:   Completed by Adron Bene  Zollie Pee, SLP  Student Supervised and reviewed by Herbie [place] CCC-SLP  Patient Details  Name: Jacob Rosales MRN: [id number] Date of Birth: November 19, 1927  Today's Date: 12/20/2014 Time: SLP Start Time (ACUTE ONLY): 1120-SLP Stop Time (ACUTE ONLY): 1140 SLP Time Calculation (min) (ACUTE ONLY): 20 min  Past Medical History:  Past Medical History  Diagnosis Date  . Complete AV block   . Chronic systolic heart failure   . Cardiomyopathy, ischemic   . Automatic implantable cardiac defibrillator in situ   . CAD (coronary artery disease) of artery bypass graft   . Prostate cancer     S/P radiation rx  . Osteoporosis     A/P Vertebral compression fx's  . Hypertension   . Dyslipidemia   . TIA (transient ischemic attack)     H/o  .  Lumbago 10/05/2012  . Cervicalgia 04/06/2012  . 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 Unspecified vitamin D deficiency 10/07/2011  . Unspecified pruritic disorder 10/07/2011  . Rash and other nonspecific skin eruption 10/07/2011  . Basal cell carcinoma of skin of lower limb, including hip 04/08/2011  . Squamous cell carcinoma of skin of lower limb, including hip 04/08/2011  . Unspecified sinusitis (chronic) 10/08/2010  . Impotence of organic origin 10/08/2010  . Seborrhea 04/09/2010  . Insomnia, unspecified 04/09/2010  . Altered mental status 01/12/2010  . Full incontinence of feces 10/09/2009  . Atrial fibrillation 05/15/2006  . Unspecified urinary incontinence 05/15/2006  . Sebaceous cyst 10/09/2005  . Pathologic fracture of vertebrae 05/16/1991  . Fracture of C2 vertebra, closed     s/p cervical fusion 05/2013  . Osteoporosis with fracture 04/05/2013    History of vertebal fracture.   Marland Kitchen AICD (automatic cardioverter/defibrillator) present    Past Surgical History:  Past Surgical History  Procedure Laterality Date  . Coronary artery bypass graft    . Hernia repair    . Kyphosis surgery    . Biv-icd  11/21/2010    [employer] ICD Model#CD3231-40 [phone]  . Cardiac defibrillator placement    . Cyst excision  10/2012    back Dr. Wilhemina Bonito  . Cyst removal neck  12/2012    basel cell (right) Dr. Sarajane Jews  . Colonoscopy  2006    polyps benign  . Dexa  April 2010  . Posterior cervical fusion/foraminotomy N/A 05/21/2013    Procedure: Cervical One, Cervical Two, Cervical Three Posterior cervical  fusion with lateral mass fixation;  Surgeon: Charlie Pitter, MD;  Location:  [facility];  Service: Neurosurgery;  Laterality: N/A;  POSTERIOR CERVICAL  FUSION/FORAMINOTOMY LEVEL 2   HPI:  HPI: Pt is an 81 y/o male with primary complaint of SOB and confusion. Pt  unable to provide reliable history. Pt's wife reports pt's memory has  demonstrated progressive memory problems in the last several months. CXR  revealed worsening CHF and worseing  pleural effusions.  CT revealed no  acute intracranial pathology.   No Data Recorded  Assessment / Plan / Recommendation CHL IP CLINICAL IMPRESSIONS 12/20/2014  Dysphagia Diagnosis Mild oral phase dysphagia;Mild pharyngeal phase  dysphagia  Clinical impression Pt demonstrates mild oral and pharyngeal phase  dysphagia characterized by premature spillage and delayed swallow  initiation to the level of the valleculae. Noted prolonged oral holding  with liquids before swallow initiation. Flash penetration noted with thin  and nectar thick consistencies during the swallow. Increased oral residue  with nectar thick liquids. Pt still at risk for aspiration given  delayed  swallow inititation and reduced sensation. Recommend pt initiate dys 1/  thin liquid diet with full supervision to cue for strategies; slow rate;  small bites/sips; clear throat following sips of thin liquids. Speech will  follow-up for diet tolerance.       CHL IP TREATMENT RECOMMENDATION 12/20/2014  Treatment Plan Recommendations Therapy as outlined in treatment plan below      CHL IP DIET RECOMMENDATION 12/20/2014  Diet Recommendations Dysphagia 1 (Puree);Thin liquid  Liquid Administration via Cup;Straw  Medication Administration Crushed with puree  Compensations Small sips/bites;Slow rate;Clear throat after each swallow  Postural Changes and/or Swallow Maneuvers Seated upright 90 degrees     CHL IP OTHER RECOMMENDATIONS 12/20/2014  Recommended Consults (None)  Oral Care Recommendations Oral care BID  Other Recommendations (None)     No flowsheet data found.   CHL IP FREQUENCY AND DURATION 12/20/2014  Speech Therapy Frequency (ACUTE ONLY) min 2x/week  Treatment Duration 2 weeks     Pertinent Vitals/Pain NA    SLP Swallow Goals No flowsheet data found.  No flowsheet data found.    No flowsheet data found.   CHL IP ORAL PHASE 12/20/2014  Lips (None)  Tongue (None)  Mucous membranes (None)  Nutritional status (None)  Other (None)  Oxygen therapy (None)  Oral  Phase Impaired  Oral - Pudding Teaspoon (None)  Oral - Pudding Cup (None)  Oral - Honey Teaspoon (None)  Oral - Honey Cup (None)  Oral - Honey Syringe (None)  Oral - Nectar Teaspoon (None)  Oral - Nectar Cup Delayed oral transit;Other (Comment)  Oral - Nectar Straw Delayed oral transit;Other (Comment)  Oral - Nectar Syringe (None)  Oral - Ice Chips (None)  Oral - Thin Teaspoon (None) Oral - Thin Cup Delayed oral transit;Other  (Comment)  Oral - Thin Straw Delayed oral transit;Other (Comment)  Oral - Thin Syringe (None)  Oral - Puree Delayed oral transit  Oral - Mechanical Soft (None)  Oral - Regular Delayed oral transit  Oral - Multi-consistency (None)  Oral - Pill (None)  Oral Phase - Comment (None)      CHL IP PHARYNGEAL PHASE 12/20/2014  Pharyngeal Phase Impaired  Pharyngeal - Pudding Teaspoon (None)  Penetration/Aspiration details (pudding teaspoon) (None)  Pharyngeal - Pudding Cup (None)  Penetration/Aspiration details (pudding cup) (None)  Pharyngeal - Honey Teaspoon (None)  Penetration/Aspiration details (honey teaspoon) (None)  Pharyngeal - Honey Cup (None)  Penetration/Aspiration details (honey cup) (None)  Pharyngeal - Honey Syringe (None)  Penetration/Aspiration details (honey syringe) (None)  Pharyngeal - Nectar Teaspoon (None)  Penetration/Aspiration details (nectar teaspoon) (None)  Pharyngeal - Nectar Cup Pharyngeal residue - valleculae;Pharyngeal residue  - pyriform sinuses;Penetration/Aspiration during swallow;Delayed swallow  initiation  Penetration/Aspiration details (nectar cup) Material enters airway,  remains ABOVE vocal cords then ejected out  Pharyngeal - Nectar Straw Pharyngeal residue - pyriform sinuses;Pharyngeal  residue - posterior pharnyx;Penetration/Aspiration during swallow;Delayed  swallow initiation  Penetration/Aspiration details (nectar straw) Material enters airway,  remains ABOVE vocal cords then ejected out  Pharyngeal - Nectar Syringe (None)  Penetration/Aspiration details  (nectar syringe) (None)  Pharyngeal - Ice Chips (None)  Penetration/Aspiration details (ice chips) (None)  Pharyngeal - Thin Teaspoon (None)  Penetration/Aspiration details (thin teaspoon) (None)  Pharyngeal - Thin Cup Penetration/Aspiration during swallow;Delayed  swallow initiation  Penetration/Aspiration details (thin cup) Material enters airway, remains  ABOVE vocal cords then ejected out  Pharyngeal - Thin Straw Penetration/Aspiration during swallow;Delayed  swallow initiation  Penetration/Aspiration details (thin straw) Material enters airway,  remains  ABOVE vocal cords then ejected out  Pharyngeal - Thin Syringe (None)  Penetration/Aspiration details (thin syringe') (None)  Pharyngeal - Puree Delayed swallow initiation  Penetration/Aspiration details (puree) (None)  Pharyngeal - Mechanical Soft (None)  Penetration/Aspiration details (mechanical soft) (None)  Pharyngeal - Regular Delayed swallow initiation  Penetration/Aspiration details (regular) (None)  Pharyngeal - Multi-consistency (None)  Penetration/Aspiration details (multi-consistency) (None)  Pharyngeal - Pill (None)  Penetration/Aspiration details (pill) (None)  Pharyngeal Comment (None)     CHL IP CERVICAL ESOPHAGEAL PHASE 12/20/2014  Cervical Esophageal Phase WFL  Pudding Teaspoon (None)  Pudding Cup (None)  Honey Teaspoon (None)  Honey Cup (None)  Honey Syringe (None)  Nectar Teaspoon (None)  Nectar Cup (None)  Nectar Straw (None)  Nectar Syringe (None)  Thin Teaspoon (None)  Thin Cup (None)  Thin Straw (None)  Thin Syringe (None)  Cervical Esophageal Comment (None)    No flowsheet data found.        Herbie [place], [place] CCC-SLP [phone]  [place], Katherene Ponto 12/20/2014, 1:36 PM      Current Medications:  . antiseptic oral rinse  7 mL Mouth Rinse BID  . carvedilol  6.25 mg Oral BID WC  . dipyridamole-aspirin  1 capsule Oral Daily  . feeding supplement (ENSURE ENLIVE)  237 mL Oral BID BM  . furosemide  20 mg Intravenous Q12H  . heparin  subcutaneous  5,000 Units Subcutaneous 3 times per day  . isosorbide-hydrALAZINE  1 tablet Oral BID  . LORazepam  1 mg Oral QHS  . polyvinyl alcohol  1 drop Both Eyes q morning - 10a  . saccharomyces boulardii  250 mg Oral BID  . senna-docusate  2 tablet Oral Daily  . simvastatin  20 mg Oral q1800      ASSESSMENT AND PLAN: 1. Acute on Chronic Systolic CHF:   - EF was 69% by echo 04/18, unchanged.  - He received 120 mg total of IV Lasix 04/17 & 18.  - I/Os net negative 4.3L and weight is down 15lbs?.  - He appears euvolemic. I will switch him from IV Lasix 20mg  BID to PO Lasix 40mg  today. BMET pending this AM.  - Continue Coreg 6.25mg  BID and hydralazine + nitrate in place of ACE-I (due to AKI). - Continue low sodium diet, strict I/Os and daily weights to monitor volume status.   2. Elevated Troponin:  - minimal elevation with flat trend.  - He has h/o CAD but denies CP.  - EKG w/o ischemia.  - 2D echo reviewed, EF unchanged and no sig WMA - continue medical Rx    3. CAD: h/o CABG. No chest pain. Continue medical therapy.   4. Elevated D-dimer: V/Q scan w/ very low prob PE. Per IM     Signed, Eileen Stanford , PA-C 8:02 AM 12/22/2014  Patinet seen and examined  Agree with findings as noted by Kathlene November above Patient's volume status looks good .  I wiould make changes as noted. Note that daughter says pt's eating the past couple days has not been good  ON pureed.  WIll ned to follow  May need to pull back on diuretics if intake not good.    Dorris Carnes

## 2014-12-23 DIAGNOSIS — I5023 Acute on chronic systolic (congestive) heart failure: Secondary | ICD-10-CM

## 2014-12-23 DIAGNOSIS — R7989 Other specified abnormal findings of blood chemistry: Secondary | ICD-10-CM

## 2014-12-23 DIAGNOSIS — I2510 Atherosclerotic heart disease of native coronary artery without angina pectoris: Secondary | ICD-10-CM

## 2014-12-23 MED ORDER — CARVEDILOL 6.25 MG PO TABS: 6.2500 mg | ORAL_TABLET | Freq: Two times a day (BID) | ORAL | Status: DC

## 2014-12-23 MED ORDER — LORAZEPAM 1 MG PO TABS: 1.0000 mg | ORAL_TABLET | Freq: Every evening | ORAL | Status: DC | PRN

## 2014-12-23 MED ORDER — ISOSORB DINITRATE-HYDRALAZINE 20-37.5 MG PO TABS: 1.0000 | ORAL_TABLET | Freq: Two times a day (BID) | ORAL | Status: DC

## 2014-12-23 MED ORDER — FUROSEMIDE 40 MG PO TABS: 40.0000 mg | ORAL_TABLET | Freq: Every day | ORAL | Status: DC

## 2014-12-23 NOTE — Discharge Summary (Signed)
Physician Discharge Summary  Jacob Rosales VVO:[id number] DOB: 02-May-1928 DOA: 12/18/2014  PCP: Hollace Kinnier, DO  Admit date: 12/18/2014 Discharge date: 12/23/2014  Time spent: 45 minutes  Recommendations for Outpatient Follow-up:  1. CUt down lasix to 20mg  daily in 3-4days 2. Bmet in 1 week 3. Speech therapy FU at SNF  4. PCP Dr.Reed in 1 week  Discharge Diagnoses:    Acute on chronic systolic CHF (congestive heart failure), NYHA class 3   Dementia   Elevated d-dimer   Confusion   AKI on CKD 3   D-dimer, elevated   LFT elevation   Protein-calorie malnutrition, severe   Discharge Condition: stable  Diet recommendation: Dysphagia 1 (pureed) diet with nectar thick liquids  Filed Weights   12/21/14 0824 12/22/14 0542 12/23/14 0706  Weight: 66.815 kg (147 lb 4.8 oz) 63.3 kg (139 lb 8.8 oz) 61.1 kg (134 lb 11.2 oz)    History of present illness:  Chief Complaint: Sob, confusion, wakeness HPI: Jacob Rosales is a 81 y.o. male with dementia was admitted with dyspnea, weakness which was progressive  Hospital Course:  1. Acute on chronic Systolic CHF -EF of 26% unchanged -followed by Cardiology -diuresed with IV lasix and lost 20lbs fluid weight -continue coreg, started bidil per Cards and ACE stopped due to AKI on CKD -weight down 7lbs -will be discharged to SNF on 40mg  po lasix for 3-4days then down to 20mg  daily  2. AKI on CKD -improving, ACE held, resumed lasix -baseline creatinine 1.4-1.5 -creatinine at discharge 1.3  3. Elevated d Dimer -non specific, low prob VQ scan and negative dopplers  4. Dementia - with intermittent periods of agitation and confusion -now stable, had sitter during hospitalization  5. Elevated troponin -non ACS pattern, Cards following, ECHo with EF unchanged and no further workup per Cards -continue aggrenox/coreg/statin  6. H/o afib, AV block and pacer -allergic to warfarin, on aggrenox chroncially, continued  7. Elevated  LFTS -likley due to passive hepatic congestion, GB wall thickening noted on [place] likely due to edematous state, no Abd pain, no N/V/leukocytsosis, no murphys sign -improved with diuresis  8. Dysphagia -s/p speech eval -D1 diet with nectar thick liquids recommended, will need speech FU   Consultations:  Cardiology  Discharge Exam: Filed Vitals:   12/23/14 0706  BP: 146/76  Pulse:   Temp: 97.5 F (36.4 C)  Resp: 18    General: AAox to self and place only Cardiovascular: S1S2/RRR Respiratory:CTAB  Discharge Instructions   Discharge Instructions    Diet - low sodium heart healthy    Complete by:  As directed      Increase activity slowly    Complete by:  As directed           Current Discharge Medication List    START taking these medications   Details  isosorbide-hydrALAZINE (BIDIL) 20-37.5 MG per tablet Take 1 tablet by mouth 2 (two) times daily.      CONTINUE these medications which have CHANGED   Details  carvedilol (COREG) 6.25 MG tablet Take 1 tablet (6.25 mg total) by mouth 2 (two) times daily with a meal. Take one tablet by mouth twice daily to strengthen the hear Qty: 30 tablet, Refills: 0   Associated Diagnoses: Acute on chronic systolic congestive heart failure    furosemide (LASIX) 40 MG tablet Take 1 tablet (40 mg total) by mouth daily. Take 40mg  daily for 3days then 20mg  daily Qty: 30 tablet, Refills: 0    LORazepam (ATIVAN) 1  MG tablet Take 1 tablet (1 mg total) by mouth at bedtime as needed (for sleep). Qty: 15 tablet, Refills: 0      CONTINUE these medications which have NOT CHANGED   Details  Calcium Carbonate-Vitamin D (CALCIUM 600 + D PO) Take 1 tablet by mouth daily.     dipyridamole-aspirin (AGGRENOX) 200-25 MG per 12 hr capsule Take one tablet twice daily to reduce stroke Qty: 180 capsule, Refills: 3    lactose free nutrition (BOOST) LIQD Take 237 mLs by mouth every morning.    Polyethyl Glycol-Propyl Glycol 0.4-0.3 % GEL Apply 1  drop to eye every morning.    polyethylene glycol (MIRALAX / GLYCOLAX) packet Take 17 g by mouth daily as needed for moderate constipation.    saccharomyces boulardii (FLORASTOR) 250 MG capsule Take 250 mg by mouth 2 (two) times daily.    senna-docusate (SENOKOT-S) 8.6-50 MG per tablet Take 2 tablets by mouth daily.    simvastatin (ZOCOR) 20 MG tablet TAKE 1 TABLET BY MOUTH AT BEDTIME FOR CHOLESTEROL Qty: 90 tablet, Refills: 1    tolterodine (DETROL LA) 4 MG 24 hr capsule Take 4 mg by mouth every morning.    alendronate (FOSAMAX) 70 MG tablet Take 1 tablet (70 mg total) by mouth once a week. Take with a full glass of water on an empty stomach. Take at least 30 minutes before eating, do not lie down for 30 minutes after taking. Qty: 4 tablet, Refills: 3    AMBULATORY NON FORMULARY MEDICATION Forteo Needles and Syringes Use for subcutaneous injections of Forteo daily Qty: 100 each, Refills: 3    cholecalciferol (VITAMIN D) 1000 UNITS tablet Take 1,000 Units by mouth daily.      triamcinolone (KENALOG) 0.025 % cream Apply 1 application topically 2 (two) times daily as needed (rash).      STOP taking these medications     amoxicillin-clavulanate (AUGMENTIN) 875-125 MG per tablet      lisinopril (PRINIVIL,ZESTRIL) 20 MG tablet      triamterene-hydrochlorothiazide (DYAZIDE) 37.5-25 MG per capsule        Allergies  Allergen Reactions  . Warfarin Sodium Other (See Comments)    REACTION: Stomach bleeding  . Amlodipine Besylate Swelling      The results of significant diagnostics from this hospitalization (including imaging, microbiology, ancillary and laboratory) are listed below for reference.    Significant Diagnostic Studies: Dg Chest 2 View  12/22/2014   CLINICAL DATA:  Cough  EXAM: CHEST  2 VIEW  COMPARISON:  12/19/2014  FINDINGS: Cardiac shadow is enlarged but stable. Pacing device is again seen. Postsurgical changes are noted. Changes of prior vertebral augmentation are  noted. Chronic compression deformities are again noted. No sizable effusion is seen. Persistent changes in the medial right lung base are again seen. Mild vascular congestion is noted.  IMPRESSION: Mild vascular congestion.  Persistent changes in the medial right lung base.   Electronically Signed   By: Inez Catalina M.D.   On: 12/22/2014 16:32   Dg Chest 2 View  12/19/2014   CLINICAL DATA:  Short of breath for 1 week  EXAM: CHEST  2 VIEW  COMPARISON:  12/18/2014  FINDINGS: The heart is moderately enlarged. Pulmonary edema and vascular congestion are worse. AICD device and leads are stable. No pneumothorax. Pleural effusions are worse. Stable thoracic spine with multiple compression deformities.  IMPRESSION: Worsening CHF and worsening pleural effusions.   Electronically Signed   By: Marybelle Killings M.D.   On: 12/19/2014 19:01  Dg Chest 2 View  12/18/2014   CLINICAL DATA:  Cough, SOB, AMS, PNA 12/16/14 HX: HTN, CABG, Defibrillator, Open Heart Surgery  EXAM: CHEST  2 VIEW  COMPARISON:  05/13/2014.  FINDINGS: Changes from CABG surgery are stable. Cardiac silhouette is mildly enlarged. Aorta is mildly uncoiled. No mediastinal or hilar masses. Left anterior chest wall AICD is stable and well positioned.  There is central vascular congestion. There are small bilateral pleural effusions. Mild lower lung zone interstitial thickening is noted.  No pneumothorax.  Bony thorax is demineralized. There are compression fractures of multiple vertebrae, 1 treated with prior kyphoplasty, stable from the prior study.  IMPRESSION: Cardiomegaly with lower lung zone interstitial thickening with central vascular congestion and small pleural effusions. Findings are consistent with mild congestive heart failure.   Electronically Signed   By: Lajean Manes M.D.   On: 12/18/2014 15:40   Ct Head Wo Contrast  12/19/2014   CLINICAL DATA:  Confusion  EXAM: CT HEAD WITHOUT CONTRAST  TECHNIQUE: Contiguous axial images were obtained from the  base of the skull through the vertex without intravenous contrast.  COMPARISON:  01/13/2010  FINDINGS: Moderate global atrophy. Chronic ischemic changes in the periventricular white matter and bilateral basal ganglia. There is no mass effect, midline shift, or acute hemorrhage. Mastoid air cells are clear. Cranium is intact. Fusion hardware in the upper cervical spine is noted.  IMPRESSION: No acute intracranial pathology.   Electronically Signed   By: Marybelle Killings M.D.   On: 12/19/2014 18:51   US Renal  12/20/2014   CLINICAL DATA:  Elevated creatinine.  EXAM: RENAL/URINARY TRACT ULTRASOUND COMPLETE  COMPARISON:  None.  FINDINGS: Right Kidney:  Length: 10.8 cm. Cortical thinning. Echogenicity within normal limits. No mass or hydronephrosis visualized.  Left Kidney:  Length: 10.6 cm. Cortical thinning. Echogenicity within normal limits. No significant mass or hydronephrosis visualized. 3.5 cm simple cyst left lower pole.  Bladder:  The bladder is nondistended.  Foley catheter present.  IMPRESSION: Bilateral renal cortical thinning. No acute abnormality. No hydronephrosis or bladder distention. Foley catheter present.   Electronically Signed   By: Marcello Moores  Register   On: 12/20/2014 08:27   Nm Pulmonary Perf And Vent  12/19/2014   CLINICAL DATA:  81 year old male with shortness of breath.  EXAM: NUCLEAR MEDICINE VENTILATION - PERFUSION LUNG SCAN  TECHNIQUE: Ventilation images were obtained in multiple projections using inhaled aerosol technetium 99 M DTPA. Perfusion images were obtained in multiple projections after intravenous injection of Tc-61m MAA.  RADIOPHARMACEUTICALS:  40.0 mCi Tc-24m DTPA aerosol and 6.0 mCi Tc-31m MAA  COMPARISON:  12/18/2014 chest radiograph.  FINDINGS: Ventilation: No focal ventilation defects identified. Mild central clumping is present.  Perfusion: No wedge shaped peripheral perfusion defects to suggest acute pulmonary embolism. Minimal nonsegmental patchy areas of slightly decreased  profusion noted.  IMPRESSION: Very low probability of pulmonary embolus (less than 10%).   Electronically Signed   By: Margarette Canada M.D.   On: 12/19/2014 18:37   Dg Swallowing Func-speech Pathology  12/20/2014    Objective Swallowing Evaluation:   Completed by Rodena Goldmann, SLP  Student Supervised and reviewed by Herbie Baltimore [place] CCC-SLP  Patient Details  Name: Jacob Rosales MRN: [id number] Date of Birth: [birth year]  Today's Date: 12/20/2014 Time: SLP Start Time (ACUTE ONLY): 1120-SLP Stop Time (ACUTE ONLY): 1140 SLP Time Calculation (min) (ACUTE ONLY): 20 min  Past Medical History:  Past Medical History  Diagnosis Date  . Complete AV block   .  Chronic systolic heart failure   . Cardiomyopathy, ischemic   . Automatic implantable cardiac defibrillator in situ   . CAD (coronary artery disease) of artery bypass graft   . Prostate cancer     S/P radiation rx  . Osteoporosis     A/P Vertebral compression fx's  . Hypertension   . Dyslipidemia   . TIA (transient ischemic attack)     H/o  . Lumbago 10/05/2012  . Cervicalgia 04/06/2012  . 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 Unspecified vitamin D deficiency 10/07/2011  . Unspecified pruritic disorder 10/07/2011  . Rash and other nonspecific skin eruption 10/07/2011  . Basal cell carcinoma of skin of lower limb, including hip 04/08/2011  . Squamous cell carcinoma of skin of lower limb, including hip 04/08/2011  . Unspecified sinusitis (chronic) 10/08/2010  . Impotence of organic origin 10/08/2010  . Seborrhea 04/09/2010  . Insomnia, unspecified 04/09/2010  . Altered mental status 01/12/2010  . Full incontinence of feces 10/09/2009  . Atrial fibrillation 05/15/2006  . Unspecified urinary incontinence 05/15/2006  . Sebaceous cyst 10/09/2005  . Pathologic fracture of vertebrae 05/16/1991  . Fracture of C2 vertebra, closed     s/p cervical fusion 05/2013  . Osteoporosis with fracture 04/05/2013    History of vertebal fracture.   Marland Kitchen AICD (automatic cardioverter/defibrillator) present     Past Surgical History:  Past Surgical History  Procedure Laterality Date  . Coronary artery bypass graft    . Hernia repair    . Kyphosis surgery    . Biv-icd  11/21/2010    [employer] ICD Model#CD3231-40 [phone]  . Cardiac defibrillator placement    . Cyst excision  10/2012    back Dr. Wilhemina Bonito  . Cyst removal neck  12/2012    basel cell (right) Dr. Sarajane Jews  . Colonoscopy  2006    polyps benign  . Dexa  April 2010  . Posterior cervical fusion/foraminotomy N/A 05/21/2013    Procedure: Cervical One, Cervical Two, Cervical Three Posterior cervical  fusion with lateral mass fixation;  Surgeon: Charlie Pitter, MD;  Location:  [facility];  Service: Neurosurgery;  Laterality: N/A;  POSTERIOR CERVICAL  FUSION/FORAMINOTOMY LEVEL 2   HPI:  HPI: Pt is an 81 y/o male with primary complaint of SOB and confusion. Pt  unable to provide reliable history. Pt's wife reports pt's memory has  demonstrated progressive memory problems in the last several months. CXR  revealed worsening CHF and worseing pleural effusions.  CT revealed no  acute intracranial pathology.   No Data Recorded  Assessment / Plan / Recommendation CHL IP CLINICAL IMPRESSIONS 12/20/2014  Dysphagia Diagnosis Mild oral phase dysphagia;Mild pharyngeal phase  dysphagia  Clinical impression Pt demonstrates mild oral and pharyngeal phase  dysphagia characterized by premature spillage and delayed swallow  initiation to the level of the valleculae. Noted prolonged oral holding  with liquids before swallow initiation. Flash penetration noted with thin  and nectar thick consistencies during the swallow. Increased oral residue  with nectar thick liquids. Pt still at risk for aspiration given delayed  swallow inititation and reduced sensation. Recommend pt initiate dys 1/  thin liquid diet with full supervision to cue for strategies; slow rate;  small bites/sips; clear throat following sips of thin liquids. Speech will  follow-up for diet tolerance.       CHL IP  TREATMENT RECOMMENDATION 12/20/2014  Treatment Plan Recommendations Therapy as outlined in treatment plan below      CHL  IP DIET RECOMMENDATION 12/20/2014  Diet Recommendations Dysphagia 1 (Puree);Thin liquid  Liquid Administration via Cup;Straw  Medication Administration Crushed with puree  Compensations Small sips/bites;Slow rate;Clear throat after each swallow  Postural Changes and/or Swallow Maneuvers Seated upright 90 degrees     CHL IP OTHER RECOMMENDATIONS 12/20/2014  Recommended Consults (None)  Oral Care Recommendations Oral care BID  Other Recommendations (None)     No flowsheet data found.   CHL IP FREQUENCY AND DURATION 12/20/2014  Speech Therapy Frequency (ACUTE ONLY) min 2x/week  Treatment Duration 2 weeks     Pertinent Vitals/Pain NA    SLP Swallow Goals No flowsheet data found.  No flowsheet data found.    No flowsheet data found.   CHL IP ORAL PHASE 12/20/2014  Lips (None)  Tongue (None)  Mucous membranes (None)  Nutritional status (None)  Other (None)  Oxygen therapy (None)  Oral Phase Impaired  Oral - Pudding Teaspoon (None)  Oral - Pudding Cup (None)  Oral - Honey Teaspoon (None)  Oral - Honey Cup (None)  Oral - Honey Syringe (None)  Oral - Nectar Teaspoon (None)  Oral - Nectar Cup Delayed oral transit;Other (Comment)  Oral - Nectar Straw Delayed oral transit;Other (Comment)  Oral - Nectar Syringe (None)  Oral - Ice Chips (None)  Oral - Thin Teaspoon (None) Oral - Thin Cup Delayed oral transit;Other  (Comment)  Oral - Thin Straw Delayed oral transit;Other (Comment)  Oral - Thin Syringe (None)  Oral - Puree Delayed oral transit  Oral - Mechanical Soft (None)  Oral - Regular Delayed oral transit  Oral - Multi-consistency (None)  Oral - Pill (None)  Oral Phase - Comment (None)      CHL IP PHARYNGEAL PHASE 12/20/2014  Pharyngeal Phase Impaired  Pharyngeal - Pudding Teaspoon (None)  Penetration/Aspiration details (pudding teaspoon) (None)  Pharyngeal - Pudding Cup (None)  Penetration/Aspiration details  (pudding cup) (None)  Pharyngeal - Honey Teaspoon (None)  Penetration/Aspiration details (honey teaspoon) (None)  Pharyngeal - Honey Cup (None)  Penetration/Aspiration details (honey cup) (None)  Pharyngeal - Honey Syringe (None)  Penetration/Aspiration details (honey syringe) (None)  Pharyngeal - Nectar Teaspoon (None)  Penetration/Aspiration details (nectar teaspoon) (None)  Pharyngeal - Nectar Cup Pharyngeal residue - valleculae;Pharyngeal residue  - pyriform sinuses;Penetration/Aspiration during swallow;Delayed swallow  initiation  Penetration/Aspiration details (nectar cup) Material enters airway,  remains ABOVE vocal cords then ejected out  Pharyngeal - Nectar Straw Pharyngeal residue - pyriform sinuses;Pharyngeal  residue - posterior pharnyx;Penetration/Aspiration during swallow;Delayed  swallow initiation  Penetration/Aspiration details (nectar straw) Material enters airway,  remains ABOVE vocal cords then ejected out  Pharyngeal - Nectar Syringe (None)  Penetration/Aspiration details (nectar syringe) (None)  Pharyngeal - Ice Chips (None)  Penetration/Aspiration details (ice chips) (None)  Pharyngeal - Thin Teaspoon (None)  Penetration/Aspiration details (thin teaspoon) (None)  Pharyngeal - Thin Cup Penetration/Aspiration during swallow;Delayed  swallow initiation  Penetration/Aspiration details (thin cup) Material enters airway, remains  ABOVE vocal cords then ejected out  Pharyngeal - Thin Straw Penetration/Aspiration during swallow;Delayed  swallow initiation  Penetration/Aspiration details (thin straw) Material enters airway,  remains ABOVE vocal cords then ejected out  Pharyngeal - Thin Syringe (None)  Penetration/Aspiration details (thin syringe') (None)  Pharyngeal - Puree Delayed swallow initiation  Penetration/Aspiration details (puree) (None)  Pharyngeal - Mechanical Soft (None)  Penetration/Aspiration details (mechanical soft) (None)  Pharyngeal - Regular Delayed swallow initiation   Penetration/Aspiration details (regular) (None)  Pharyngeal - Multi-consistency (None)  Penetration/Aspiration details (multi-consistency) (None)  Pharyngeal - Pill (None)  Penetration/Aspiration details (pill) (None)  Pharyngeal Comment (None)     CHL IP CERVICAL ESOPHAGEAL PHASE 12/20/2014  Cervical Esophageal Phase WFL  Pudding Teaspoon (None)  Pudding Cup (None)  Honey Teaspoon (None)  Honey Cup (None)  Honey Syringe (None)  Nectar Teaspoon (None)  Nectar Cup (None)  Nectar Straw (None)  Nectar Syringe (None)  Thin Teaspoon (None)  Thin Cup (None)  Thin Straw (None)  Thin Syringe (None)  Cervical Esophageal Comment (None)    No flowsheet data found.        Herbie Baltimore, MA CCC-SLP [phone]  Lynann Beaver 12/20/2014, 1:36 PM    US Abdomen Limited Ruq  12/19/2014   CLINICAL DATA:  81 year old male with elevated liver function tests. History of skin cancer and prostate cancer. Hypertension. Initial encounter.  EXAM: US ABDOMEN LIMITED - RIGHT UPPER QUADRANT  COMPARISON:  None.  FINDINGS: Gallbladder:  Diffuse gallbladder wall thickening measuring up to 8 mm. No gallstone visualized. Patient was not tender over this region during scanning per ultrasound technologist.  Common bile duct:  Diameter: 4.3 mm.  Liver:  No focal liver lesion. Left lobe liver suboptimally evaluated secondary to patient's habitus and bowel gas.  Right-sided pleural effusion and trace ascites noted.  IMPRESSION: Diffuse gallbladder wall thickening measuring up to 8 mm. No gallstone visualized. Patient was not tender over this region during scanning as may be expected with acute cholecystitis. This appearance may be related to elevated right heart pressures, third spacing of fluid, metabolic abnormality or liver disease. Chronic cholecystitis could cause a similar appearance in the proper clinical setting. No focal mass along the gallbladder wall thickening identified.  Right-sided pleural effusion and trace ascites.  Suboptimal  evaluation left lobe liver secondary to patient's habitus and bowel gas.   Electronically Signed   By: Genia Del M.D.   On: 12/19/2014 07:55    Microbiology: Recent Results (from the past 240 hour(s))  Blood culture (routine x 2)     Status: None (Preliminary result)   Collection Time: 12/18/14  2:10 PM  Result Value Ref Range Status   Specimen Description BLOOD RIGHT HAND  Final   Special Requests BOTTLES DRAWN AEROBIC ONLY 10CC  Final   Culture   Final           BLOOD CULTURE RECEIVED NO GROWTH TO DATE CULTURE WILL BE HELD FOR 5 DAYS BEFORE ISSUING A FINAL NEGATIVE REPORT Performed at [facility]    Report Status PENDING  Incomplete  Culture, blood (routine x 2)     Status: None (Preliminary result)   Collection Time: 12/18/14  2:27 PM  Result Value Ref Range Status   Specimen Description BLOOD RIGHT ANTECUBITAL  Final   Special Requests BOTTLES DRAWN AEROBIC ONLY 10CC  Final   Culture   Final           BLOOD CULTURE RECEIVED NO GROWTH TO DATE CULTURE WILL BE HELD FOR 5 DAYS BEFORE ISSUING A FINAL NEGATIVE REPORT Performed at [facility]    Report Status PENDING  Incomplete     Labs: Basic Metabolic Panel:  Recent Labs Lab 12/18/14 1411 12/19/14 0125 12/19/14 1923 12/21/14 0736 12/22/14 0745  NA 138 141 143 144 145  K 4.4 4.1 3.6 3.7 3.6  CL 106 106 105 108 108  CO2 19 23 26 22 24   GLUCOSE 134* 117* 88 105* 105*  BUN 49* 48* 47* 35* 29*  CREATININE 1.78* 1.86* 1.80* 1.47* 1.31  CALCIUM 9.2 9.2  8.9 8.8 9.3  MG  --  2.2  --   --   --    Liver Function Tests:  Recent Labs Lab 12/18/14 1411 12/19/14 0125 12/19/14 1923 12/21/14 0736  AST 236* 200* 199* 106*  ALT 208* 220* 257* 199*  ALKPHOS 72 64 65 63  BILITOT 1.8* 1.9* 2.1* 1.9*  PROT 6.6 6.7 6.5 6.6  ALBUMIN 3.9 3.9 3.7 3.6   No results for input(s): LIPASE, AMYLASE in the last 168 hours.  Recent Labs Lab 12/18/14 2042  AMMONIA 20   CBC:  Recent Labs Lab 12/18/14 1411  12/18/14 2042 12/20/14 0704  WBC 5.7  --  6.1  NEUTROABS 4.5  --   --   HGB 13.3  --  13.5  HCT 40.1 42.0 43.2  MCV 96.9  --  97.7  PLT 108*  --  125*   Cardiac Enzymes:  Recent Labs Lab 12/18/14 2042 12/19/14 0125 12/19/14 0755  TROPONINI 0.10* 0.11* 0.09*   BNP: BNP (last 3 results)  Recent Labs  12/19/14 0125 12/20/14 0704 12/21/14 0736  BNP 3590.3* 2089.9* 2091.6*    ProBNP (last 3 results)  Recent Labs  04/27/14 1539 06/16/14 1440  PROBNP 1024.0* 982.0*    CBG:  Recent Labs Lab 12/18/14 1425  GLUCAP 142*       Signed:  Adeja Sarratt  [facility] 12/23/2014, 10:17 AM

## 2014-12-23 NOTE — Progress Notes (Signed)
Report given to snf. Joslyn Hy, MSN, RN, [employer]

## 2014-12-23 NOTE — Clinical Social Work Note (Signed)
Per MD patient ready to DC back to [facility]. RN, patient/family Marcie Bal and wife), and facility notified of patient's DC. RN given number for report. DC packet on patient's chart. Ambulance transport requested for patient for 2:00PM. CSW signing off at this time.   Liz Beach MSW, [place], [place], [phone]

## 2014-12-23 NOTE — Progress Notes (Signed)
Speech Language Pathology Treatment: Dysphagia  Patient Details Name: VIRGAL WARMUTH MRN: [id number] DOB: 1928/01/03 Today's Date: 12/23/2014 Time: 7893-8101 SLP Time Calculation (min) (ACUTE ONLY): 20 min  Assessment / Plan / Recommendation Clinical Impression  Pt demonstrates ongoing difficulty with Puree and nectar thick liquids with intermittent cough. Pt struggles to follow cues for throat clear that are necessary to clear residuals and penetrate despite max cues. Recommend pt continue current diet with assist for cues, though risk of aspiration is moderate due to cognitive impairment and severity of dysphagia. Will continue efforts.    HPI HPI: Pt is an 81 y/o male with primary complaint of SOB and confusion. Pt unable to provide reliable history. Pt's wife reports pt's memory has demonstrated progressive memory problems in the last several months. CXR revealed worsening CHF and worseing pleural effusions.  CT revealed no acute intracranial pathology.    Pertinent Vitals Pain Assessment: 0-10  SLP Plan  Continue with current plan of care    Recommendations Diet recommendations: Dysphagia 1 (puree);Nectar-thick liquid Liquids provided via: Cup Medication Administration: Crushed with puree Supervision: Full supervision/cueing for compensatory strategies Compensations: Small sips/bites;Slow rate;Clear throat after each swallow Postural Changes and/or Swallow Maneuvers: Seated upright 90 degrees              Oral Care Recommendations: Oral care BID Follow up Recommendations: Skilled Nursing facility Plan: Continue with current plan of care    GO    [facility], MA CCC-SLP [phone]  Lynann Beaver 12/23/2014, 9:22 AM

## 2014-12-23 NOTE — Progress Notes (Signed)
Patient Name: Jacob Rosales Date of Encounter: 12/23/2014  Principal Problem:   SOB (shortness of breath) Active Problems:   Acute on chronic systolic CHF (congestive heart failure), NYHA class 3   Elevated d-dimer   Confusion   Creatinine elevation   D-dimer, elevated   LFT elevation   Protein-calorie malnutrition, severe   Primary Cardiologist: Dr Jacob Rosales  Patient Profile: 81 y/o male with h/o CAD s/p CABG, ICM s/p ICD, CHB, chronic systolic HF, HTN, HLD, PAF, prior TIA and dementia admitted by Internal Medicine 4/17 for dyspnea, weakness and confusion.   SUBJECTIVE:  Seems in good spirits this AM. Still a little confused. No complaints. With sitter eating breakfast.   OBJECTIVE Filed Vitals:   12/22/14 1405 12/22/14 1700 12/22/14 2258 12/23/14 0706  BP: 125/70 154/95 116/69 146/76  Pulse: 73 76 70   Temp: 97.9 F (36.6 C)  97.7 F (36.5 C) 97.5 F (36.4 C)  TempSrc:   Oral Oral  Resp: 24  18 18   Height:      Weight:    134 lb 11.2 oz (61.1 kg)  SpO2: 98%  98% 96%    Intake/Output Summary (Last 24 hours) at 12/23/14 0836 Last data filed at 12/23/14 0439  Gross per 24 hour  Intake    624 ml  Output   1000 ml  Net   -376 ml   Filed Weights   12/21/14 0824 12/22/14 0542 12/23/14 0706  Weight: 147 lb 4.8 oz (66.815 kg) 139 lb 8.8 oz (63.3 kg) 134 lb 11.2 oz (61.1 kg)    PHYSICAL EXAM General: Well developed, well nourished, male in no acute distress. Thin and frail. confused Head: Normocephalic, atraumatic.  Neck: Supple without bruits, no JVD . Lungs:  Resp regular and unlabored, Heart: RRR, S1, S2, no S3, S4, 2/6 murmur; no rub. Abdomen: Soft, non-tender, non-distended, BS + x 4.  Extremities: No clubbing, cyanosis, no edema.  Neuro: Alert and oriented X 1. Moves all extremities spontaneously.  LABS: CBC: No results for input(s): WBC, NEUTROABS, HGB, HCT, MCV, PLT in the last 72 hours. INR:No results for input(s): INR in the last 72 hours. Basic  Metabolic Panel:  Recent Labs  12/21/14 0736 12/22/14 0745  NA 144 145  Jacob 3.7 3.6  CL 108 108  CO2 22 24  GLUCOSE 105* 105*  BUN 35* 29*  CREATININE 1.47* 1.31  CALCIUM 8.8 9.3   Liver Function Tests:  Recent Labs  12/21/14 0736  AST 106*  ALT 199*  ALKPHOS 63  BILITOT 1.9*  PROT 6.6  ALBUMIN 3.6    BNP:  B NATRIURETIC PEPTIDE  Date/Time Value Ref Range Status  12/21/2014 07:36 AM 2091.6* 0.0 - 100.0 pg/mL Final  12/20/2014 07:04 AM 2089.9* 0.0 - 100.0 pg/mL Final    TELE:  Not on tele  ECHO: 12/19/2014 Conclusions  - Left ventricle: Apex moves a little better than the other walls. The cavity size was moderately dilated. The estimated ejection fraction was 25%. Diffuse hypokinesis. - Aortic valve: Sclerosis without stenosis. There was mild regurgitation. - Aorta: Mild dilitation of the aortic root. - Mitral valve: There was moderate regurgitation. - Left atrium: The atrium was moderately dilated. - Right ventricle: The cavity size was mildly dilated. Pacer wire or catheter noted in right ventricle. Systolic function was mildly reduced. - Pulmonary arteries: PA peak pressure: 52 mm Hg (S).  Radiology/Studies: Dg Chest 2 View  12/22/2014   CLINICAL DATA:  Cough  EXAM: CHEST  2 VIEW  COMPARISON:  12/19/2014  FINDINGS: Cardiac shadow is enlarged but stable. Pacing device is again seen. Postsurgical changes are noted. Changes of prior vertebral augmentation are noted. Chronic compression deformities are again noted. No sizable effusion is seen. Persistent changes in the medial right lung base are again seen. Mild vascular congestion is noted.  IMPRESSION: Mild vascular congestion.  Persistent changes in the medial right lung base.   Electronically Signed   By: Inez Catalina M.D.   On: 12/22/2014 16:32     Current Medications:  . antiseptic oral rinse  7 mL Mouth Rinse BID  . carvedilol  6.25 mg Oral BID WC  . dipyridamole-aspirin  1 capsule Oral Daily    . feeding supplement (ENSURE ENLIVE)  237 mL Oral BID BM  . furosemide  40 mg Oral Daily  . heparin subcutaneous  5,000 Units Subcutaneous 3 times per day  . isosorbide-hydrALAZINE  1 tablet Oral BID  . LORazepam  1 mg Oral QHS  . polyvinyl alcohol  1 drop Both Eyes q morning - 10a  . saccharomyces boulardii  250 mg Oral BID  . senna-docusate  2 tablet Oral Daily  . simvastatin  20 mg Oral q1800      ASSESSMENT AND PLAN: 1. Acute on Chronic Systolic CHF:   - EF was 25% by echo 04/18, unchanged.  - He received 120 mg total of IV Lasix 04/17 & 18.  - I/Os net negative 4.7L and weight is down 20lbs.  - He appears euvolemic. Converted to PO 40mg  lasix yesterday. Creat improved 1.47--> 1.31. However, repeat CXR with mild pulm vasc congestion.  - Continue Coreg 6.25mg  BID and hydralazine + nitrate in place of ACE-I (due to AKI). - Continue low sodium diet, strict I/Os and daily weights to monitor volume status.   2. Elevated Troponin:  - minimal elevation with flat trend.  - He has h/o CAD but denies CP.  - EKG w/o ischemia.  - 2D echo reviewed, EF unchanged and no sig WMA - continue medical Rx    3. CAD: h/o CABG. No chest pain. Continue medical therapy.   4. Elevated D-dimer: V/Q scan w/ very low prob PE. Per IM     Signed, Jacob Rosales , PA-C 8:36 AM 12/23/2014   Patient  seen and examined  Agree with findings of Jacob Rosales above    Volume status looks good   Patinet snoozing.  Lungs CTA  Neck:  JVP normal  Ext without edema I would keep on oral lasix with close f/u of I/Os Note plans to d/c to rehab.   Will need outpatient f/u  Will arrange  Jacob Rosales

## 2014-12-25 LAB — CULTURE, BLOOD (ROUTINE X 2)
Culture: NO GROWTH
Culture: NO GROWTH

## 2014-12-26 ENCOUNTER — Telehealth: Payer: Self-pay | Admitting: Internal Medicine

## 2014-12-26 NOTE — Telephone Encounter (Signed)
Spoke with wife and he has been in the hospital from 4/15-4/22.  He is now in a rehab facility and they are going to postpone the appointment for tomorrow until he is out of facility.  She will call our office a few days prior to him getting out and we will then schedule his appointment for 7-10 days out

## 2014-12-26 NOTE — Telephone Encounter (Signed)
New Message  Pt wife calling to speak w/ Rn about eph sched for 4/26. Please call back and dicsuss.

## 2014-12-27 ENCOUNTER — Encounter: Payer: Self-pay | Admitting: Internal Medicine

## 2014-12-27 ENCOUNTER — Encounter: Payer: Medicare Other | Admitting: Internal Medicine

## 2014-12-27 ENCOUNTER — Non-Acute Institutional Stay (SKILLED_NURSING_FACILITY): Payer: Medicare Other | Admitting: Internal Medicine

## 2014-12-27 DIAGNOSIS — M8080XD Other osteoporosis with current pathological fracture, unspecified site, subsequent encounter for fracture with routine healing: Secondary | ICD-10-CM

## 2014-12-27 DIAGNOSIS — I482 Chronic atrial fibrillation, unspecified: Secondary | ICD-10-CM

## 2014-12-27 DIAGNOSIS — G4700 Insomnia, unspecified: Secondary | ICD-10-CM

## 2014-12-27 DIAGNOSIS — I5022 Chronic systolic (congestive) heart failure: Secondary | ICD-10-CM | POA: Diagnosis not present

## 2014-12-27 DIAGNOSIS — G309 Alzheimer's disease, unspecified: Secondary | ICD-10-CM

## 2014-12-27 DIAGNOSIS — F0151 Vascular dementia with behavioral disturbance: Secondary | ICD-10-CM | POA: Diagnosis not present

## 2014-12-27 DIAGNOSIS — R2681 Unsteadiness on feet: Secondary | ICD-10-CM | POA: Diagnosis not present

## 2014-12-27 DIAGNOSIS — N3281 Overactive bladder: Secondary | ICD-10-CM | POA: Diagnosis not present

## 2014-12-27 MED ORDER — MELATONIN 5 MG PO CAPS: 1.0000 | ORAL_CAPSULE | Freq: Every evening | ORAL | Status: DC | PRN

## 2014-12-27 NOTE — Progress Notes (Signed)
Patient ID: Jacob Rosales, male   DOB: 01/10/1928, 81 y.o.   MRN: [id number]  Provider:  Rexene Edison. Mariea Clonts, D.O., C.M.D. Location:  [facility]  PCP: Hollace Kinnier, DO  Code Status: DNR Goals of Care:  Advanced Directive information Does patient have an advance directive?: Yes, Type of Advance Directive: [place];Living will;Out of facility DNR (pink MOST or yellow form), Pre-existing out of facility DNR order (yellow form or pink MOST form): Pink MOST form placed in chart (order not valid for inpatient use), Does patient want to make changes to advanced directive?: No - Patient declined   Allergies  Allergen Reactions  . Warfarin Sodium Other (See Comments)    REACTION: Stomach bleeding  . Amlodipine Besylate Swelling    Chief Complaint  Patient presents with  . New Admit To SNF    s/p hospitalization with acute chf exacerbation    HPI: 81 y.o. male with h/o dementia, seems mixed vascular and AD, prostate cancer s/p XRT, osteoporosis, CAD s/p CABG, complete AV block s/p AICD, afib, C2 neck fx was seen for admission to short term rehab at [facility].  He had been living in his independent living [place] with his wife prior to his recent hospitalization.  He was initially treated for suspected pneumonia with abx, but was not improving.  He was becoming weaker, more confused, remained hypoxic.  Low grade temps did resolve though with the abx.  At the time of ED visit, he was short of breath, pox 88, more confused, and was noted to have CHF on CXR so he received 40mg  lasix x 1, but labs then revealed acute renal failure, BNP elevated and elevated transaminases.  EKG showed paced rhythm.  He was admitted for further treatment--diuresed, and slowly improved.  Weight trended down.  He was sent here for rehab with lasix 40mg  daily for 3 days and then reduced to 20mg  daily.    He feels well and wants to go home.  Denies shortness of breath, weakness, pain, discomfort.  His  wife requested I call her and she was concerned that he is not sleeping well.  He has been sleeping short intervals only at night and requiring sitters due to agitation.  He has been on as needed ativan 1mg  for many years, but it's never been effective, she says.  We discussed using conservative measures like increasing daytime activity and encouraging sleep at night.  We also spoke about his urinary frequency/nocturia contributing.  His detrol has been held by me since admission, but has not made any significant changes in his urinary frequency.  Apparently, he's been on myrbetriq before per his chart w/o benefit.  He gets up 2-3 times at night.  He's had prostate cancer s/p XRT.  She wanted him on something so we decided on melatonin.    We also discussed his osteoporosis therapy and it had already been decided that he would not take anything but vitamin D at this point.  He had been on fosamax for several years and it was stopped due to long term use and lack of perceived benefit.  He was then put on forteo from 5/15-1/16 when his insurance would no longer cover it despite multiple authorization attempts.    We did discuss his progressive memory impairment and I explained that I do feel he now has dementia due to his memory loss plus functional decline.    ROS: (from pt) Review of Systems  Constitutional: Negative for fever and  malaise/fatigue.  HENT: Negative for congestion.   Respiratory: Negative for shortness of breath.   Cardiovascular: Negative for chest pain.  Gastrointestinal: Negative for abdominal pain and constipation.  Genitourinary: Positive for frequency. Negative for dysuria and urgency.  Musculoskeletal: Negative for falls.  Neurological: Negative for dizziness and weakness.  Psychiatric/Behavioral: Positive for memory loss.    Past Medical History  Diagnosis Date  . Complete AV block   . Chronic systolic heart failure   . Cardiomyopathy, ischemic   . Automatic implantable  cardiac defibrillator in situ   . CAD (coronary artery disease) of artery bypass graft   . Prostate cancer     S/P radiation rx  . Osteoporosis     A/P Vertebral compression fx's  . Hypertension   . Dyslipidemia   . TIA (transient ischemic attack)     H/o  . Lumbago 10/05/2012  . Cervicalgia 04/06/2012  . 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 Unspecified vitamin D deficiency 10/07/2011  . Unspecified pruritic disorder 10/07/2011  . Rash and other nonspecific skin eruption 10/07/2011  . Basal cell carcinoma of skin of lower limb, including hip 04/08/2011  . Squamous cell carcinoma of skin of lower limb, including hip 04/08/2011  . Unspecified sinusitis (chronic) 10/08/2010  . Impotence of organic origin 10/08/2010  . Seborrhea 04/09/2010  . Insomnia, unspecified 04/09/2010  . Altered mental status 01/12/2010  . Full incontinence of feces 10/09/2009  . Atrial fibrillation 05/15/2006  . Unspecified urinary incontinence 05/15/2006  . Sebaceous cyst 10/09/2005  . Pathologic fracture of vertebrae 05/16/1991  . Fracture of C2 vertebra, closed     s/p cervical fusion 05/2013  . Osteoporosis with fracture 04/05/2013    History of vertebal fracture.   Marland Kitchen AICD (automatic cardioverter/defibrillator) present    Past Surgical History  Procedure Laterality Date  . Coronary artery bypass graft    . Hernia repair    . Kyphosis surgery    . Biv-icd  11/21/2010    [employer] ICD Model#CD3231-40 [phone]  . Cardiac defibrillator placement    . Cyst excision  10/2012    back Dr. Wilhemina Bonito  . Cyst removal neck  12/2012    basel cell (right) Dr. Sarajane Jews  . Colonoscopy  2006    polyps benign  . Dexa  April 2010  . Posterior cervical fusion/foraminotomy N/A 05/21/2013    Procedure: Cervical One, Cervical Two, Cervical Three Posterior cervical fusion with lateral mass fixation;  Surgeon: Charlie Pitter, MD;  Location: [place];  Service: Neurosurgery;  Laterality: N/A;  POSTERIOR CERVICAL  FUSION/FORAMINOTOMY LEVEL 2   Social History:   reports that he quit smoking about 52 years ago. He has never used smokeless tobacco. He reports that he drinks about 1.2 oz of alcohol per week. He reports that he does not use illicit drugs.  Family History  Problem Relation Age of Onset  . Coronary artery disease Neg Hx   . Heart disease Mother     Medications: Patient's Medications  New Prescriptions   No medications on file  Previous Medications   ALENDRONATE (FOSAMAX) 70 MG TABLET    Take 1 tablet (70 mg total) by mouth once a week. Take with a full glass of water on an empty stomach. Take at least 30 minutes before eating, do not lie down for 30 minutes after taking.   AMBULATORY NON FORMULARY MEDICATION    Forteo Needles and Syringes Use for subcutaneous injections of Forteo daily  CALCIUM CARBONATE-VITAMIN D (CALCIUM 600 + D PO)    Take 1 tablet by mouth daily.    CARVEDILOL (COREG) 6.25 MG TABLET    Take 1 tablet (6.25 mg total) by mouth 2 (two) times daily with a meal. Take one tablet by mouth twice daily to strengthen the hear   CHOLECALCIFEROL (VITAMIN D) 1000 UNITS TABLET    Take 1,000 Units by mouth daily.     DIPYRIDAMOLE-ASPIRIN (AGGRENOX) 200-25 MG PER 12 HR CAPSULE    Take one tablet twice daily to reduce stroke   FUROSEMIDE (LASIX) 40 MG TABLET    Take 1 tablet (40 mg total) by mouth daily. Take 40mg  daily for 3days then 20mg  daily   ISOSORBIDE-HYDRALAZINE (BIDIL) 20-37.5 MG PER TABLET    Take 1 tablet by mouth 2 (two) times daily.   LACTOSE FREE NUTRITION (BOOST) LIQD    Take 237 mLs by mouth every morning.   LORAZEPAM (ATIVAN) 1 MG TABLET    Take 1 tablet (1 mg total) by mouth at bedtime as needed (for sleep).   POLYETHYL GLYCOL-PROPYL GLYCOL 0.4-0.3 % GEL    Apply 1 drop to eye every morning.   POLYETHYLENE GLYCOL (MIRALAX / GLYCOLAX) PACKET    Take 17 g by mouth daily as needed for moderate constipation.   SACCHAROMYCES BOULARDII (FLORASTOR) 250 MG CAPSULE    Take  250 mg by mouth 2 (two) times daily.   SENNA-DOCUSATE (SENOKOT-S) 8.6-50 MG PER TABLET    Take 2 tablets by mouth daily.   SIMVASTATIN (ZOCOR) 20 MG TABLET    TAKE 1 TABLET BY MOUTH AT BEDTIME FOR CHOLESTEROL   TOLTERODINE (DETROL LA) 4 MG 24 HR CAPSULE    Take 4 mg by mouth every morning.   TRIAMCINOLONE (KENALOG) 0.025 % CREAM    Apply 1 application topically 2 (two) times daily as needed (rash).  Modified Medications   No medications on file  Discontinued Medications   No medications on file     Physical Exam: Filed Vitals:   12/27/14 1213  BP: 128/74  Pulse: 72  Temp: 97.3 F (36.3 C)  Resp: 17  Height: 5\' 9"  (1.753 m)  Weight: 142 lb 3.2 oz (64.501 kg)  SpO2: 92%  Physical Exam  Constitutional: No distress.  Frail white male seated in his recliner chair  Cardiovascular: Normal rate, regular rhythm, normal heart sounds and intact distal pulses.   Pulmonary/Chest: Effort normal and breath sounds normal. He has no rales.  Abdominal: Soft. Bowel sounds are normal. He exhibits no distension and no mass. There is no tenderness.  Musculoskeletal: Normal range of motion.  Uses walker to ambulate--says he is able to use restroom on his own  Neurological: He is alert.  Skin: Skin is warm and dry.     Labs reviewed: Basic Metabolic Panel:  Recent Labs  12/19/14 0125 12/19/14 1923 12/21/14 0736 12/22/14 0745  NA 141 143 144 145  K 4.1 3.6 3.7 3.6  CL 106 105 108 108  CO2 23 26 22 24   GLUCOSE 117* 88 105* 105*  BUN 48* 47* 35* 29*  CREATININE 1.86* 1.80* 1.47* 1.31  CALCIUM 9.2 8.9 8.8 9.3  MG 2.2  --   --   --    Liver Function Tests:  Recent Labs  12/19/14 0125 12/19/14 1923 12/21/14 0736  AST 200* 199* 106*  ALT 220* 257* 199*  ALKPHOS 64 65 63  BILITOT 1.9* 2.1* 1.9*  PROT 6.7 6.5 6.6  ALBUMIN 3.9 3.7 3.6  No results for input(s): LIPASE, AMYLASE in the last 8760 hours.  Recent Labs  12/18/14 2042  AMMONIA 20   CBC:  Recent Labs   04/27/14 1539 12/18/14 1411 12/18/14 2042 12/20/14 0704  WBC 5.1 5.7  --  6.1  NEUTROABS  --  4.5  --   --   HGB 13.1 13.3  --  13.5  HCT 39.5 40.1 42.0 43.2  MCV 95.9 96.9  --  97.7  PLT 162.0 108*  --  125*   Cardiac Enzymes:  Recent Labs  12/18/14 2042 12/19/14 0125 12/19/14 0755  TROPONINI 0.10* 0.11* 0.09*  CBG:  Recent Labs  12/18/14 1425  GLUCAP 142*    Imaging and Procedures: 12/18/14:  CXR:  Cardiomegaly with lower lung zone interstitial thickening with central vascular congestion and small pleural effusions. Findings are consistent with mild congestive heart failure.  12/19/14:  RUQ abdominal US:  Diffuse gallbladder wall thickening measuring up to 8 mm. No gallstone visualized. Patient was not tender over this region during scanning as may be expected with acute cholecystitis. This appearance may be related to elevated right heart pressures, third spacing of fluid, metabolic abnormality or liver disease. Chroniccholecystitis could cause a similar appearance in the proper clinical setting. No focal mass along the gallbladder wall thickening identified. Right-sided pleural effusion and trace ascites. Suboptimal evaluation left lobe liver secondary to patient's habitus and bowel gas.  12/19/14:  VQ scan:  Very low probability of PE (less than 10%)  12/19/14:  CT head w/o contrast:  No acute intracranial pathology.  Moderate global atrophy. Chronic ischemic changes in the periventricular white matter and bilateral basal ganglia. There is no mass effect, midline shift, or acute hemorrhage. Mastoid air cells are clear. Cranium is intact. Fusion hardware in the upper cervical spine is noted.  12/19/14:  CXR:  Worsening CHF and worsening pleural effusions.  12/19/14:  Renal US:  Bilateral renal cortical thinning. No acute abnormality. No hydronephrosis or bladder distention. Foley catheter present.  12/20/14:  Swallowing study:  Mild oral phase dysphagia, mild pharnygeal  phase dysphagia   12/22/14:  CXR: mild vascular congestion; persistent changes in medial right lung base  Assessment/Plan 1. Chronic systolic heart failure -has stabilized, continues on the lasix 20mg  daily now, no longer dyspneic, sats wnl, but this and possible pneumonia precipitated his latest decline/weakness  2. Unstable gait -here for therapy--walks with walker  3. 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 Chronic atrial fibrillation -on aggrenox--chart does not reveal prior stroke, but has changes on CT scan that indicate prior strokes/ischemia -rate controlled with coreg  5. Osteoporosis with fracture, with routine healing, subsequent encounter -pt did not benefit from fosamax, and insurance won't cover forteo -increase vitamin D from 1000 to 2000 units daily and cont ca with D -will consider prolia when leaves rehab  6. Overactive bladder -d/c detrol, appears he was already on myrbetriq from paper chart so will not try this, just monitor -does not seem to be severe at this time with only 2-3x per night, prior prostate ca with XRT  7. Insomnia -discussed conservative approaches with his wife, will stop ativan and start melatonin 5mg  one hour before bedtime   Family/ staff Communication: discussed with nursing manager    Elick Aguilera L. Charae Depaolis, D.O. [facility] [address], [place] [address]  Cell Phone (Mon-Fri 8am-5pm):  [phone] On Call:  [phone] & follow prompts after 5pm & weekends Office Phone:  [phone] Office Fax:  [phone]

## 2014-12-29 LAB — BASIC METABOLIC PANEL
BUN: 25 mg/dL — AB (ref 4–21)
Creatinine: 1.1 mg/dL (ref 0.6–1.3)
Glucose: 81 mg/dL
POTASSIUM: 4.7 mmol/L (ref 3.4–5.3)
SODIUM: 138 mmol/L (ref 137–147)

## 2014-12-29 LAB — HEPATIC FUNCTION PANEL
ALT: 38 U/L (ref 10–40)
AST: 27 U/L (ref 14–40)
Alkaline Phosphatase: 42 U/L (ref 25–125)
Bilirubin, Total: 0.7 mg/dL

## 2015-01-05 ENCOUNTER — Non-Acute Institutional Stay (SKILLED_NURSING_FACILITY): Payer: Medicare Other | Admitting: Adult Health

## 2015-01-05 ENCOUNTER — Encounter: Payer: Self-pay | Admitting: Adult Health

## 2015-01-05 DIAGNOSIS — F0150 Vascular dementia without behavioral disturbance: Secondary | ICD-10-CM

## 2015-01-05 DIAGNOSIS — R7989 Other specified abnormal findings of blood chemistry: Secondary | ICD-10-CM

## 2015-01-05 DIAGNOSIS — I482 Chronic atrial fibrillation, unspecified: Secondary | ICD-10-CM

## 2015-01-05 DIAGNOSIS — R269 Unspecified abnormalities of gait and mobility: Secondary | ICD-10-CM

## 2015-01-05 DIAGNOSIS — G309 Alzheimer's disease, unspecified: Secondary | ICD-10-CM

## 2015-01-05 DIAGNOSIS — I5022 Chronic systolic (congestive) heart failure: Secondary | ICD-10-CM

## 2015-01-05 DIAGNOSIS — F0280 Dementia in other diseases classified elsewhere without behavioral disturbance: Secondary | ICD-10-CM | POA: Diagnosis not present

## 2015-01-05 DIAGNOSIS — G4700 Insomnia, unspecified: Secondary | ICD-10-CM | POA: Diagnosis not present

## 2015-01-05 DIAGNOSIS — R945 Abnormal results of liver function studies: Secondary | ICD-10-CM

## 2015-01-05 NOTE — Progress Notes (Signed)
Patient ID: Jacob Rosales, male   DOB: Aug 02, 1928, 81 y.o.   MRN: [id number]   Location:  [facility]   Code Status: DNR Goals of Care:  Advanced Directive information     Allergies  Allergen Reactions  . Warfarin Sodium Other (See Comments)    REACTION: Stomach bleeding  . Amlodipine Besylate Swelling    Chief Complaint  Patient presents with  . Acute Visit    f/u dementia, insomnia, wandering    HPI: 81 y.o. male with h/o dementia, seems mixed vascular and AD, prostate cancer s/p XRT, osteoporosis, CAD s/p CABG, complete AV block s/p AICD, afib, C2 neck fx was seen for admission to short term rehab at [facility].  He had been living in his independent living [place] with his wife prior to his recent hospitalization. He was initially treated with Augmentin and then sent to the hospital due to hypoxia. He was diagnosed with CHF and diuresed. During that time he also had a swallow evaluation which showed mild dysphagia and he is currently on puree diet and tolerating this well. He continues to work with speech. He denies any SOB, CP, or increased edema. His weight has decreased by 7 lbs since admission. In physical therapy, he has made gains and is currently ambulating with a walker without assistance. He was started on Namenda two weeks ago and is tolerating this well. He has had periods of agitation in the past but this has improved. He was also started on melatonin and then notes indicate that he his sleeping better. He is asking to return home to live with his wife independently with caregivers.   ROS: (from pt) ROS  Past Medical History  Diagnosis Date  . Complete AV block   . Chronic systolic heart failure   . Cardiomyopathy, ischemic   . Automatic implantable cardiac defibrillator in situ   . CAD (coronary artery disease) of artery bypass graft   . Prostate cancer     S/P radiation rx  . Osteoporosis     A/P Vertebral compression fx's  . Hypertension   .  Dyslipidemia   . TIA (transient ischemic attack)     H/o  . Lumbago 10/05/2012  . Cervicalgia 04/06/2012  . 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 Unspecified vitamin D deficiency 10/07/2011  . Unspecified pruritic disorder 10/07/2011  . Rash and other nonspecific skin eruption 10/07/2011  . Basal cell carcinoma of skin of lower limb, including hip 04/08/2011  . Squamous cell carcinoma of skin of lower limb, including hip 04/08/2011  . Unspecified sinusitis (chronic) 10/08/2010  . Impotence of organic origin 10/08/2010  . Seborrhea 04/09/2010  . Insomnia, unspecified 04/09/2010  . Altered mental status 01/12/2010  . Full incontinence of feces 10/09/2009  . Atrial fibrillation 05/15/2006  . Unspecified urinary incontinence 05/15/2006  . Sebaceous cyst 10/09/2005  . Pathologic fracture of vertebrae 05/16/1991  . Fracture of C2 vertebra, closed     s/p cervical fusion 05/2013  . Osteoporosis with fracture 04/05/2013    History of vertebal fracture.   Marland Kitchen AICD (automatic cardioverter/defibrillator) present    Past Surgical History  Procedure Laterality Date  . Coronary artery bypass graft    . Hernia repair    . Kyphosis surgery    . Biv-icd  11/21/2010    [employer] ICD Model#CD3231-40 [phone]  . Cardiac defibrillator placement    . Cyst excision  10/2012    back Dr. Wilhemina Bonito  .  Cyst removal neck  12/2012    basel cell (right) Dr. Sarajane Jews  . Colonoscopy  2006    polyps benign  . Dexa  April 2010  . Posterior cervical fusion/foraminotomy N/A 05/21/2013    Procedure: Cervical One, Cervical Two, Cervical Three Posterior cervical fusion with lateral mass fixation;  Surgeon: Charlie Pitter, MD;  Location: [facility];  Service: Neurosurgery;  Laterality: N/A;  POSTERIOR CERVICAL FUSION/FORAMINOTOMY LEVEL 2   Social History:   reports that he quit smoking about 53 years ago. He has never used smokeless tobacco. He reports that he drinks about 1.2 oz of alcohol per week. He reports that he  does not use illicit drugs.  Family History  Problem Relation Age of Onset  . Coronary artery disease Neg Hx   . Heart disease Mother     Medications: Patient's Medications  New Prescriptions   No medications on file  Previous Medications   ASPIRIN 81 MG TABLET    Take 81 mg by mouth daily.   CALCIUM CARBONATE-VITAMIN D (CALCIUM 600 + D PO)    Take 1 tablet by mouth daily.    CARVEDILOL (COREG) 6.25 MG TABLET    Take 1 tablet (6.25 mg total) by mouth 2 (two) times daily with a meal. Take one tablet by mouth twice daily to strengthen the hear   CHOLECALCIFEROL (VITAMIN D) 1000 UNITS TABLET    Take 2,000 Units by mouth daily.   DIPYRIDAMOLE (PERSANTINE) 50 MG TABLET    Take 50 mg by mouth 3 (three) times daily.   FUROSEMIDE (LASIX) 20 MG TABLET    Take 20 mg by mouth.   ISOSORBIDE-HYDRALAZINE (BIDIL) 20-37.5 MG PER TABLET    Take 1 tablet by mouth 2 (two) times daily.   LACTOSE FREE NUTRITION (BOOST) LIQD    Take 237 mLs by mouth every morning.   MELATONIN 5 MG CAPS    Take 1 capsule (5 mg total) by mouth at bedtime as needed.   MEMANTINE (NAMENDA TITRATION PACK) TABLET PACK    Take by mouth See admin instructions. 5 mg/day for =1 week; 5 mg twice daily for =1 week; 15 mg/day given in 5 mg and 10 mg separated doses for =1 week; then 10 mg twice daily   POLYETHYL GLYCOL-PROPYL GLYCOL 0.4-0.3 % GEL    Apply 1 drop to eye every morning.   POLYETHYLENE GLYCOL (MIRALAX / GLYCOLAX) PACKET    Take 17 g by mouth daily as needed for moderate constipation.   SACCHAROMYCES BOULARDII (FLORASTOR) 250 MG CAPSULE    Take 250 mg by mouth 2 (two) times daily.   SENNA-DOCUSATE (SENOKOT-S) 8.6-50 MG PER TABLET    Take 2 tablets by mouth daily.   SIMVASTATIN (ZOCOR) 20 MG TABLET    TAKE 1 TABLET BY MOUTH AT BEDTIME FOR CHOLESTEROL   TRIAMCINOLONE (KENALOG) 0.025 % CREAM    Apply 1 application topically 2 (two) times daily as needed (rash).  Modified Medications   No medications on file  Discontinued  Medications   AMBULATORY NON FORMULARY MEDICATION    Forteo Needles and Syringes Use for subcutaneous injections of Forteo daily   DIPYRIDAMOLE-ASPIRIN (AGGRENOX) 200-25 MG PER 12 HR CAPSULE    Take one tablet twice daily to reduce stroke   FUROSEMIDE (LASIX) 40 MG TABLET    Take 1 tablet (40 mg total) by mouth daily. Take 40mg  daily for 3days then 20mg  daily     Physical Exam: Filed Vitals:   01/05/15 1054  BP: 97/56  Pulse: 61  Weight: 145 lb 6.4 oz (65.953 kg)  Physical Exam  Constitutional: No distress.  Frail white male seated in his recliner chair  Cardiovascular: Normal rate, regular rhythm, normal heart sounds and intact distal pulses.   Pulmonary/Chest: Effort normal and breath sounds normal. He has no rales.  Abdominal: Soft. Bowel sounds are normal. He exhibits no distension and no mass. There is no tenderness.  Musculoskeletal: Normal range of motion.  Uses walker to ambulate--says he is able to use restroom on his own  Neurological: He is alert.  Skin: Skin is warm and dry.     Labs reviewed: Basic Metabolic Panel:  Recent Labs  12/19/14 0125 12/19/14 1923 12/21/14 0736 12/22/14 0745 12/29/14  NA 141 143 144 145 138  K 4.1 3.6 3.7 3.6 4.7  CL 106 105 108 108  --   CO2 23 26 22 24   --   GLUCOSE 117* 88 105* 105*  --   BUN 48* 47* 35* 29* 25*  CREATININE 1.86* 1.80* 1.47* 1.31 1.1  CALCIUM 9.2 8.9 8.8 9.3  --   MG 2.2  --   --   --   --    Liver Function Tests:  Recent Labs  12/19/14 0125 12/19/14 1923 12/21/14 0736 12/29/14  AST 200* 199* 106* 27  ALT 220* 257* 199* 38  ALKPHOS 64 65 63 42  BILITOT 1.9* 2.1* 1.9*  --   PROT 6.7 6.5 6.6  --   ALBUMIN 3.9 3.7 3.6  --    No results for input(s): LIPASE, AMYLASE in the last 8760 hours.  Recent Labs  12/18/14 2042  AMMONIA 20   CBC:  Recent Labs  04/27/14 1539 12/18/14 1411 12/18/14 2042 12/20/14 0704  WBC 5.1 5.7  --  6.1  NEUTROABS  --  4.5  --   --   HGB 13.1 13.3  --  13.5  HCT  39.5 40.1 42.0 43.2  MCV 95.9 96.9  --  97.7  PLT 162.0 108*  --  125*   Cardiac Enzymes:  Recent Labs  12/18/14 2042 12/19/14 0125 12/19/14 0755  TROPONINI 0.10* 0.11* 0.09*  CBG:  Recent Labs  12/18/14 1425  GLUCAP 142*    Imaging and Procedures: 12/18/14:  CXR:  Cardiomegaly with lower lung zone interstitial thickening with central vascular congestion and small pleural effusions. Findings are consistent with mild congestive heart failure.  12/19/14:  RUQ abdominal US:  Diffuse gallbladder wall thickening measuring up to 8 mm. No gallstone visualized. Patient was not tender over this region during scanning as may be expected with acute cholecystitis. This appearance may be related to elevated right heart pressures, third spacing of fluid, metabolic abnormality or liver disease. Chroniccholecystitis could cause a similar appearance in the proper clinical setting. No focal mass along the gallbladder wall thickening identified. Right-sided pleural effusion and trace ascites. Suboptimal evaluation left lobe liver secondary to patient's habitus and bowel gas.  12/19/14:  VQ scan:  Very low probability of PE (less than 10%)  12/19/14:  CT head w/o contrast:  No acute intracranial pathology.  Moderate global atrophy. Chronic ischemic changes in the periventricular white matter and bilateral basal ganglia. There is no mass effect, midline shift, or acute hemorrhage. Mastoid air cells are clear. Cranium is intact. Fusion hardware in the upper cervical spine is noted.  12/19/14:  CXR:  Worsening CHF and worsening pleural effusions.  12/19/14:  Renal US:  Bilateral renal cortical thinning. No acute abnormality. No hydronephrosis or  bladder distention. Foley catheter present.  12/20/14:  Swallowing study:  Mild oral phase dysphagia, mild pharnygeal phase dysphagia   12/22/14:  CXR: mild vascular congestion; persistent changes in medial right lung base  Assessment/Plan 1. Chronic systolic  heart failure Improved, continue Lasix at 20 mg daily. Not on an ace but on Coreg. Would defer starting this med at this time due to age and debility   2. Unstable gait Improved with therapy  3. 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 Improvement noted with agitation.  4. Chronic atrial fibrillation -on aggrenox--chart does not reveal prior stroke, but has changes on CT scan that indicate prior strokes/ischemia -rate controlled with coreg  5. Transaminase elevation Resolved  6. Insomnia Improved with Melatonin qhs  He is medically ready for discharge. His family is having a care plan meeting today to discuss where he will be placed. He could possibly return home with caregivers and his wife to assist him. I am not sure his wife will be agreeable to this, in which case AL will be necessary.     Cindi Carbon, ANP [facility] [phone]

## 2015-01-18 ENCOUNTER — Other Ambulatory Visit: Payer: Self-pay

## 2015-01-18 MED ORDER — MEMANTINE HCL ER 28 MG PO CP24: ORAL_CAPSULE | ORAL | Status: DC

## 2015-01-18 NOTE — Telephone Encounter (Signed)
Patient discharge from Rehab, needs Namenda XR 28 mg to [facility].

## 2015-01-23 ENCOUNTER — Other Ambulatory Visit (HOSPITAL_COMMUNITY): Payer: Self-pay | Admitting: Internal Medicine

## 2015-01-23 DIAGNOSIS — R1314 Dysphagia, pharyngoesophageal phase: Secondary | ICD-10-CM

## 2015-01-25 ENCOUNTER — Encounter: Payer: Self-pay | Admitting: Nurse Practitioner

## 2015-01-25 ENCOUNTER — Encounter: Payer: Medicare Other | Admitting: Nurse Practitioner

## 2015-01-25 ENCOUNTER — Non-Acute Institutional Stay: Payer: Medicare Other | Admitting: Nurse Practitioner

## 2015-01-25 VITALS — BP 124/64 | HR 76 | Temp 97.6°F | Wt 148.0 lb

## 2015-01-25 DIAGNOSIS — F0280 Dementia in other diseases classified elsewhere without behavioral disturbance: Secondary | ICD-10-CM | POA: Diagnosis not present

## 2015-01-25 DIAGNOSIS — N3281 Overactive bladder: Secondary | ICD-10-CM | POA: Diagnosis not present

## 2015-01-25 DIAGNOSIS — I5022 Chronic systolic (congestive) heart failure: Secondary | ICD-10-CM | POA: Diagnosis not present

## 2015-01-25 DIAGNOSIS — G4700 Insomnia, unspecified: Secondary | ICD-10-CM | POA: Diagnosis not present

## 2015-01-25 DIAGNOSIS — F0150 Vascular dementia without behavioral disturbance: Secondary | ICD-10-CM

## 2015-01-25 DIAGNOSIS — R1310 Dysphagia, unspecified: Secondary | ICD-10-CM

## 2015-01-25 DIAGNOSIS — G309 Alzheimer's disease, unspecified: Secondary | ICD-10-CM

## 2015-01-25 NOTE — Progress Notes (Signed)
Patient ID: Jacob Rosales, male   DOB: April 27, 1928, 81 y.o.   MRN: [id number]   Location:  [facility]    Code Status: DNR Goals of Care:  Advanced Directive information     Allergies  Allergen Reactions  . Warfarin Sodium Other (See Comments)    REACTION: Stomach bleeding  . Amlodipine Besylate Swelling    Chief Complaint  Patient presents with  . Medical Management of Chronic Issues    2 week follow-up from Rehab CHF, dysphagia    HPI: 81 y.o. male with h/o dementia, seems mixed vascular and AD, prostate cancer s/p XRT, osteoporosis, CAD s/p CABG, complete AV block s/p AICD, afib, C2 neck fx being seen for follow up from rehab. Pt was in rehab after hospitalization for pneumonia worsening shortness of breath and hypoxia. Pt is back at home with wife in his independent living [place]. Pt was hospitalized due to hypoxia and CHF. Also had a swallow evaluation which showed mild dysphagia and he is currently on puree diet with nectar  but wife reports he does not like this diet. Wife has not been strict with this diet because she feels like food and drink are some of the last pleasures left in life and does not wish to take that away from him. Aware of the potential aspiration risk and willing to take those changes, Pt agrees.   Pt has O2 to use as needed for worsening shortness of breath, uses occasionally, overall breathing has been unchanged   Was dc'd from PT when he left rehab, conts with ST/OT at home.   Mood has been good since he has been home. Watching a lot of TV, napping a lot.  He denies any SOB, CP, or increased edema.   ROS: (from pt and wife) Review of Systems  Constitutional: Negative for fever, chills and weight loss.  HENT: Negative for tinnitus.   Respiratory: Positive for shortness of breath (baseline). Negative for cough.   Cardiovascular: Negative for chest pain, palpitations and leg swelling.  Gastrointestinal: Negative for heartburn, abdominal pain,  diarrhea and constipation.  Genitourinary: Positive for frequency (with incontience). Negative for dysuria and urgency.  Musculoskeletal: Negative for myalgias, back pain, joint pain and falls.  Skin: Negative.   Neurological: Negative for dizziness and headaches.  Psychiatric/Behavioral: Positive for memory loss. Negative for depression. The patient does not have insomnia.     Past Medical History  Diagnosis Date  . Complete AV block   . Chronic systolic heart failure   . Cardiomyopathy, ischemic   . Automatic implantable cardiac defibrillator in situ   . CAD (coronary artery disease) of artery bypass graft   . Prostate cancer     S/P radiation rx  . Osteoporosis     A/P Vertebral compression fx's  . Hypertension   . Dyslipidemia   . TIA (transient ischemic attack)     H/o  . Lumbago 10/05/2012  . Cervicalgia 04/06/2012  . 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 Unspecified vitamin D deficiency 10/07/2011  . Unspecified pruritic disorder 10/07/2011  . Rash and other nonspecific skin eruption 10/07/2011  . Basal cell carcinoma of skin of lower limb, including hip 04/08/2011  . Squamous cell carcinoma of skin of lower limb, including hip 04/08/2011  . Unspecified sinusitis (chronic) 10/08/2010  . Impotence of organic origin 10/08/2010  . Seborrhea 04/09/2010  . Insomnia, unspecified 04/09/2010  . Altered mental status 01/12/2010  . Full incontinence of feces 10/09/2009  .  Atrial fibrillation 05/15/2006  . Unspecified urinary incontinence 05/15/2006  . Sebaceous cyst 10/09/2005  . Pathologic fracture of vertebrae 05/16/1991  . Fracture of C2 vertebra, closed     s/p cervical fusion 05/2013  . Osteoporosis with fracture 04/05/2013    History of vertebal fracture.   Marland Kitchen AICD (automatic cardioverter/defibrillator) present    Past Surgical History  Procedure Laterality Date  . Coronary artery bypass graft    . Hernia repair    . Kyphosis surgery    . Biv-icd  11/21/2010    [employer] ICD Model#CD3231-40 [phone]  . Cardiac defibrillator placement    . Cyst excision  10/2012    back Dr. Wilhemina Bonito  . Cyst removal neck  12/2012    basel cell (right) Dr. Sarajane Jews  . Colonoscopy  2006    polyps benign  . Dexa  April 2010  . Posterior cervical fusion/foraminotomy N/A 05/21/2013    Procedure: Cervical One, Cervical Two, Cervical Three Posterior cervical fusion with lateral mass fixation;  Surgeon: Charlie Pitter, MD;  Location: [place] [facility];  Service: Neurosurgery;  Laterality: N/A;  POSTERIOR CERVICAL FUSION/FORAMINOTOMY LEVEL 2   Social History:   reports that he quit smoking about 53 years ago. He has never used smokeless tobacco. He reports that he drinks about 1.2 oz of alcohol per week. He reports that he does not use illicit drugs.  Family History  Problem Relation Age of Onset  . Coronary artery disease Neg Hx   . Heart disease Mother     Medications: Patient's Medications  New Prescriptions   No medications on file  Previous Medications   ASPIRIN 81 MG TABLET    Take 81 mg by mouth daily.   CALCIUM CARBONATE-VITAMIN D (CALCIUM 600 + D PO)    Take 1 tablet by mouth daily.    CARVEDILOL (COREG) 6.25 MG TABLET    Take 1 tablet (6.25 mg total) by mouth 2 (two) times daily with a meal. Take one tablet by mouth twice daily to strengthen the hear   CHOLECALCIFEROL (VITAMIN D) 1000 UNITS TABLET    Take 2,000 Units by mouth daily.   DIPYRIDAMOLE (PERSANTINE) 50 MG TABLET    Take 50 mg by mouth 3 (three) times daily.   FUROSEMIDE (LASIX) 20 MG TABLET    Take 20 mg by mouth.   ISOSORBIDE-HYDRALAZINE (BIDIL) 20-37.5 MG PER TABLET    Take 1 tablet by mouth 2 (two) times daily.   LACTOSE FREE NUTRITION (BOOST) LIQD    Take 237 mLs by mouth every morning.   MELATONIN 5 MG CAPS    Take 1 capsule (5 mg total) by mouth at bedtime as needed.   MEMANTINE (NAMENDA XR) 28 MG CP24 24 HR CAPSULE    Take one tablet daily for memory   POLYETHYL GLYCOL-PROPYL GLYCOL 0.4-0.3 % GEL     Apply 1 drop to eye every morning.   POLYETHYLENE GLYCOL (MIRALAX / GLYCOLAX) PACKET    Take 17 g by mouth daily as needed for moderate constipation.   SACCHAROMYCES BOULARDII (FLORASTOR) 250 MG CAPSULE    Take 250 mg by mouth 2 (two) times daily.   SENNA-DOCUSATE (SENOKOT-S) 8.6-50 MG PER TABLET    Take 2 tablets by mouth daily.   SIMVASTATIN (ZOCOR) 20 MG TABLET    TAKE 1 TABLET BY MOUTH AT BEDTIME FOR CHOLESTEROL   TRIAMCINOLONE (KENALOG) 0.025 % CREAM    Apply 1 application topically 2 (two) times daily as needed (rash).  Modified Medications   No medications on file  Discontinued Medications   No medications on file     Physical Exam: Filed Vitals:   01/25/15 1317  BP: 124/64  Pulse: 76  Temp: 97.6 F (36.4 C)  TempSrc: Oral  Weight: 148 lb (67.132 kg)  SpO2: 91%  Physical Exam  Constitutional: No distress.  Frail white male  HENT:  Head: Normocephalic and atraumatic.  Eyes: EOM are normal. Pupils are equal, round, and reactive to light.  Neck: Normal range of motion. No JVD present.  Cardiovascular: Normal rate, regular rhythm and normal heart sounds.   Pulmonary/Chest: Effort normal and breath sounds normal. He has no rales.  Abdominal: Soft. Bowel sounds are normal. He exhibits no distension and no mass. There is no tenderness.  Musculoskeletal: Normal range of motion. He exhibits no edema or tenderness.  Uses walker to ambulate  Neurological: He is alert.  Skin: Skin is warm and dry.     Labs reviewed: Basic Metabolic Panel:  Recent Labs  12/19/14 0125 12/19/14 1923 12/21/14 0736 12/22/14 0745 12/29/14  NA 141 143 144 145 138  K 4.1 3.6 3.7 3.6 4.7  CL 106 105 108 108  --   CO2 23 26 22 24   --   GLUCOSE 117* 88 105* 105*  --   BUN 48* 47* 35* 29* 25*  CREATININE 1.86* 1.80* 1.47* 1.31 1.1  CALCIUM 9.2 8.9 8.8 9.3  --   MG 2.2  --   --   --   --    Liver Function Tests:  Recent Labs  12/19/14 0125 12/19/14 1923 12/21/14 0736 12/29/14  AST 200*  199* 106* 27  ALT 220* 257* 199* 38  ALKPHOS 64 65 63 42  BILITOT 1.9* 2.1* 1.9*  --   PROT 6.7 6.5 6.6  --   ALBUMIN 3.9 3.7 3.6  --    No results for input(s): LIPASE, AMYLASE in the last 8760 hours.  Recent Labs  12/18/14 2042  AMMONIA 20   CBC:  Recent Labs  04/27/14 1539 12/18/14 1411 12/18/14 2042 12/20/14 0704  WBC 5.1 5.7  --  6.1  NEUTROABS  --  4.5  --   --   HGB 13.1 13.3  --  13.5  HCT 39.5 40.1 42.0 43.2  MCV 95.9 96.9  --  97.7  PLT 162.0 108*  --  125*   Cardiac Enzymes:  Recent Labs  12/18/14 2042 12/19/14 0125 12/19/14 0755  TROPONINI 0.10* 0.11* 0.09*  CBG:  Recent Labs  12/18/14 1425  GLUCAP 142*    Imaging and Procedures: 12/18/14:  CXR:  Cardiomegaly with lower lung zone interstitial thickening with central vascular congestion and small pleural effusions. Findings are consistent with mild congestive heart failure.  12/19/14:  RUQ abdominal US:  Diffuse gallbladder wall thickening measuring up to 8 mm. No gallstone visualized. Patient was not tender over this region during scanning as may be expected with acute cholecystitis. This appearance may be related to elevated right heart pressures, third spacing of fluid, metabolic abnormality or liver disease. Chroniccholecystitis could cause a similar appearance in the proper clinical setting. No focal mass along the gallbladder wall thickening identified. Right-sided pleural effusion and trace ascites. Suboptimal evaluation left lobe liver secondary to patient's habitus and bowel gas.  12/19/14:  VQ scan:  Very low probability of PE (less than 10%)  12/19/14:  CT head w/o contrast:  No acute intracranial pathology.  Moderate global atrophy. Chronic ischemic changes in the  periventricular white matter and bilateral basal ganglia. There is no mass effect, midline shift, or acute hemorrhage. Mastoid air cells are clear. Cranium is intact. Fusion hardware in the upper cervical spine is  noted.  12/19/14:  CXR:  Worsening CHF and worsening pleural effusions.  12/19/14:  Renal US:  Bilateral renal cortical thinning. No acute abnormality. No hydronephrosis or bladder distention. Foley catheter present.  12/20/14:  Swallowing study:  Mild oral phase dysphagia, mild pharnygeal phase dysphagia   12/22/14:  CXR: mild vascular congestion; persistent changes in medial right lung base  Assessment/Plan   1. Chronic systolic heart failure -stable at this time, conts on lasix, occasional shortness of breath that comes and goes but no worsening of symptoms.   2. Insomnia - conts on melatonin 5 mg   3. Mixed Alzheimer's and vascular dementia -doing well at home, conts on namenda XR   4. Overactive bladder detrol stopped in rehab due to not being effective, information provided to wife on reasons for stopping medication and she agrees.   5. Dysphagia -following with therapy, not strict on nectar consistency of liquids due to pt preference. Aware the aspiration pneumonia and risk of death associated with not following diet.

## 2015-01-28 ENCOUNTER — Other Ambulatory Visit: Payer: Self-pay | Admitting: Internal Medicine

## 2015-02-01 ENCOUNTER — Encounter: Payer: Self-pay | Admitting: Nurse Practitioner

## 2015-02-03 ENCOUNTER — Ambulatory Visit (HOSPITAL_COMMUNITY)
Admission: RE | Admit: 2015-02-03 | Discharge: 2015-02-03 | Disposition: A | Discharge status: Home / Self Care | Payer: Medicare Other | Source: Ambulatory Visit | Attending: Internal Medicine | Admitting: Internal Medicine

## 2015-02-03 DIAGNOSIS — R1314 Dysphagia, pharyngoesophageal phase: Secondary | ICD-10-CM | POA: Diagnosis not present

## 2015-02-03 NOTE — Progress Notes (Signed)
02/03/15 1400  SLP Visit Information  SLP Received On 02/03/15  Subjective  Subjective Pt alert and cooperative; accompanied by his wife.    Patient/Family Stated Goal We want to make sure he is ok for thin liquids.  Pain Assessment  Pain Assessment No/denies pain  General Information  Other Pertinent Information Jacob Rosales is a 81 yo male referred by Dr. Mariea Clonts for [facility] due to concerns for aspiration, also question of possible diet upgrade from previously recommended Nectar thick liquids, from [place] on 12/20/14.  PMH:  pneumonia, SOB, voice changes (weak, breathy voice), frequent falls due to loss of balance, gait disturbance, progressive memory loss, s/p cervical fusion of C1-2,C2-3 following fall; and s/p carciac bypass.  Wife reports pt has been eating regular foods and resumed thin liquids after returning home from Rehab.  Type of Study (MBS)  Reason for Referral Objectively evaluate swallowing function  Previous Swallow Assessment See hx.  Diet Prior to this Study Regular;Thin liquids  Temperature Spikes Noted No  Respiratory Status Room air  History of Recent Intubation No  Behavior/Cognition Alert;Cooperative;Pleasant mood  Oral Cavity - Dentition Adequate natural dentition/normal for age  Oral Motor / Sensory Function WFL  Self-Feeding Abilities Able to feed self  Patient Positioning Upright in chair/Tumbleform  Baseline Vocal Quality Breathy;Low vocal intensity  Volitional Cough Weak  Volitional Swallow Able to elicit  Anatomy WFL  Pharyngeal Secretions Not observed secondary MBS  Oral Assessment (Complete on admission/transfer/change in patient condition)  Does patient have any of the following "high risk" factors? None of the above  Does patient have any of the following "at risk" factors? None of the above  Pharyngeal Phase  Pharyngeal Phase Impaired  Pharyngeal - Nectar  Pharyngeal- Nectar Cup Reduced tongue base retraction;Reduced laryngeal elevation;Reduced anterior  laryngeal mobility;Pharyngeal residue - cp segment;Pharyngeal residue - pyriform;Penetration/Apiration after swallow;Compensatory strategies attempted (with notebox) (cough and swallow again)  Pharyngeal - Thin  Pharyngeal- Thin Cup Reduced tongue base retraction;Reduced laryngeal elevation;Reduced anterior laryngeal mobility;Pharyngeal residue - cp segment;Pharyngeal residue - pyriform;Penetration/Apiration after swallow;Compensatory strategies attempted (with notebox)  Pharyngeal - Solids  Pharyngeal- Puree Reduced epiglottic inversion;Reduced anterior laryngeal mobility;Reduced laryngeal elevation;Reduced tongue base retraction;Pharyngeal residue - valleculae;Pharyngeal residue - pyriform (Double swallows)  Cervical Esophageal Phase  Cervical Esophageal Phase Impaired  Cervical Esophageal Phase - Comment  Cervical Esophageal Comment Residue noted on the UES with both thin and Nectar thick, which begins to spill into the airway.  Pt appears to sense this and cough or clears his throat and protects the airway.  Clinical Impression  Therapy Diagnosis Mild pharyngeal phase dysphagia  Clinical Impression Pt currently exhibits a mild pharyngeal dysphagia charaterized by reduced tongue base contraction and hyoid excursion, and reduced laryngeal elevation and epiglottic deflection.  This results in residue in the pyriforms and at the UES with both thin and nectar thick liquids which begins to spill over and ppenetrates the laryngeal vestibule posteriorly.  The pt appears to sense the penetration, as he coughed or audibly cleared his throat consistently after swallowing, and swallowed again when cued (to clear the residue).  There was no aspiration observed.  There was mile residue of puree noted in the valleculae, again mostly cleared with a cued second dry swallow.  This pt shows exhibits a low intensity, breathy voice, dysphagia, and progressive memory loss per wife's report, and moved extremely slowly  (difficulty initiating first steps) and shuffling gait with h/o frequent falls.  Question if referral to Neurology may be beneficial to assess  for possible Parkinson's Disease.  Pt may also benefit from speech, voice, swalowing and cognitive therapy.  Swallow Evaluation Recommendations  SLP Diet Recommendations Age appropriate regular solids;Thin  Liquid Administration via Cup;No straw  Medication Administration Whole meds with puree  Supervision Patient able to self feed;Intermittent supervision to cue for compensatory strategies  Compensations Slow rate;Small sips/bites;Hard cough after swallow;Multiple dry swallows after each bite/sip  Postural Changes Seated upright at 90 degrees  Treatment Plan  Oral Care Recommendations Oral care BID  Other Recommendations Clarify dietary restrictions  Treatment Recommendations F/u OP SLP  Interventions Aspiration precaution training;Pharyngeal strengthening exercises;Compensatory techniques;Patient/family education;Diet toleration management by SLP  Prognosis  Prognosis for Safe Diet Advancement Good  Individuals Consulted  Consulted and Agree with Results and Recommendations Patient;Family member/caregiver  Family Member Consulted wife  Report Sent to  Referring physician;Primary SLP  SLP Time Calculation  SLP Start Time (ACUTE ONLY) 1300  SLP Stop Time (ACUTE ONLY) 1350  SLP Time Calculation (min) (ACUTE ONLY) 50 min  SLP G-Codes **NOT FOR INPATIENT CLASS**  Functional Assessment Tool Used Clinical judgement  Functional Limitations Swallowing  Swallow Current Status (H4174) CI  Swallow Goal Status (Y8144) CI  Swallow Discharge Status (Y1856) CI  SLP Evaluations  $ SLP Speech Visit 1 Procedure  SLP Evaluations  $Swallowing Treatment 1 Procedure  $MBS Swallow Outpatient 1 Procedure

## 2015-02-04 ENCOUNTER — Other Ambulatory Visit: Payer: Self-pay | Admitting: Internal Medicine

## 2015-02-08 ENCOUNTER — Ambulatory Visit (INDEPENDENT_AMBULATORY_CARE_PROVIDER_SITE_OTHER): Payer: Medicare Other | Admitting: *Deleted

## 2015-02-08 DIAGNOSIS — I5022 Chronic systolic (congestive) heart failure: Secondary | ICD-10-CM | POA: Diagnosis not present

## 2015-02-08 DIAGNOSIS — I429 Cardiomyopathy, unspecified: Secondary | ICD-10-CM

## 2015-02-08 NOTE — Progress Notes (Signed)
Remote ICD transmission.   

## 2015-02-17 LAB — CUP PACEART REMOTE DEVICE CHECK
Battery Remaining Longevity: 20 mo
Battery Remaining Percentage: 30 %
Brady Statistic AP VP Percent: 51 %
Brady Statistic AP VS Percent: 1 %
Brady Statistic AS VP Percent: 48 %
Date Time Interrogation Session: 20160608070029
HighPow Impedance: 39 Ohm
Lead Channel Impedance Value: 340 Ohm
Lead Channel Impedance Value: 640 Ohm
Lead Channel Pacing Threshold Amplitude: 0.75 V
Lead Channel Pacing Threshold Amplitude: 1.25 V
Lead Channel Pacing Threshold Amplitude: 1.25 V
Lead Channel Pacing Threshold Pulse Width: 0.5 ms
Lead Channel Pacing Threshold Pulse Width: 1 ms
Lead Channel Sensing Intrinsic Amplitude: 11.8 mV
Lead Channel Setting Pacing Pulse Width: 1 ms
Lead Channel Setting Sensing Sensitivity: 2 mV
MDC IDC MSMT BATTERY VOLTAGE: 2.84 V
MDC IDC MSMT LEADCHNL RA IMPEDANCE VALUE: 340 Ohm
MDC IDC MSMT LEADCHNL RA PACING THRESHOLD PULSEWIDTH: 0.8 ms
MDC IDC MSMT LEADCHNL RA SENSING INTR AMPL: 2.1 mV
MDC IDC SET LEADCHNL LV PACING AMPLITUDE: 2.5 V
MDC IDC SET LEADCHNL RA PACING AMPLITUDE: 2 V
MDC IDC SET LEADCHNL RV PACING AMPLITUDE: 2.5 V
MDC IDC SET LEADCHNL RV PACING PULSEWIDTH: 0.5 ms
MDC IDC SET ZONE DETECTION INTERVAL: 300 ms
MDC IDC STAT BRADY AS VS PERCENT: 1 %
MDC IDC STAT BRADY RA PERCENT PACED: 50 %
Pulse Gen Serial Number: 631860
Zone Setting Detection Interval: 335 ms

## 2015-02-22 ENCOUNTER — Encounter: Payer: Self-pay | Admitting: Cardiology

## 2015-02-24 ENCOUNTER — Encounter: Payer: Self-pay | Admitting: Internal Medicine

## 2015-03-08 ENCOUNTER — Encounter: Payer: Self-pay | Admitting: Internal Medicine

## 2015-03-08 ENCOUNTER — Non-Acute Institutional Stay: Payer: Medicare Other | Admitting: Internal Medicine

## 2015-03-08 VITALS — BP 118/72 | HR 68 | Temp 97.4°F | Wt 151.0 lb

## 2015-03-08 DIAGNOSIS — F0150 Vascular dementia without behavioral disturbance: Secondary | ICD-10-CM

## 2015-03-08 DIAGNOSIS — R1310 Dysphagia, unspecified: Secondary | ICD-10-CM

## 2015-03-08 DIAGNOSIS — G4700 Insomnia, unspecified: Secondary | ICD-10-CM | POA: Diagnosis not present

## 2015-03-08 DIAGNOSIS — G309 Alzheimer's disease, unspecified: Secondary | ICD-10-CM

## 2015-03-08 DIAGNOSIS — G214 Vascular parkinsonism: Secondary | ICD-10-CM | POA: Diagnosis not present

## 2015-03-08 DIAGNOSIS — F0280 Dementia in other diseases classified elsewhere without behavioral disturbance: Secondary | ICD-10-CM | POA: Diagnosis not present

## 2015-03-08 DIAGNOSIS — I482 Chronic atrial fibrillation, unspecified: Secondary | ICD-10-CM

## 2015-03-08 DIAGNOSIS — N3281 Overactive bladder: Secondary | ICD-10-CM | POA: Diagnosis not present

## 2015-03-08 NOTE — Progress Notes (Signed)
Patient ID: Jacob Rosales, male   DOB: June 17, 1928, 81 y.o.   MRN: [id number]   Location:  [facility]  Code Status: DNR  Goals of Care:Advanced Directive information Does patient have an advance directive?: Yes, Type of Advance Directive: [place];Living will, Does patient want to make changes to advanced directive?: No - Patient declined  Chief Complaint  Patient presents with  . Medical Management of Chronic Issues    CHF, dysphagia.  Wants to get off some medications. Here with wife  . Medication Management    should he take Detrol LA 4mg  was taking it prior to Rehab, off his lab    HPI: Patient is a 81 y.o. white male seen in the [facility] today for med mgt of chronic diseases.    Dong reasonably well with the cane.   Says he takes too many meds.  7 in am and 8 in pm.   Had a swallowing test done at [facility] which went reasonably well.  Regular diet with thin liquids was recommended and puree with meds.  He used applesauce temporarily but has now been taking pills with OJ at breakfast and water at night.    Has bruise on right arm (small circle) since last week.  Mliss Sax directed him to apply ice to the area and have derm look at it if it didn't get better. He's unsure how he got it.  He takes dipyridamole.  He bruises easily which is not new.  No falls since rehab.  He uses his walker at home and cane when out.  Admits he's more secure with the walker.  Tried to encourage walker use, but he is resistant due to social reasons.  Some persistent memory loss and confusion.  His wife asks if he could have parkinson's.  ST has had some concerns about this with his dysphagia.  I reviewed the CT brain which suggested he could have some vascular parkinsonism with ischemic changes in the basal ganglia.  See exam below  Had ICD check last month and it was ok.  Next due in Sept.    Discussed no detrol LA for him due to lack of benefit and potential to  worsen his balance and memory, cause dry mouth and confusion.  He and his wife agree it's not worthwhile and he wants fewer pills anyway.  Review of Systems:  Review of Systems  Constitutional: Negative for fever and chills.  HENT: Positive for hearing loss.   Eyes: Negative for blurred vision.       Glasses  Respiratory: Negative for shortness of breath.   Cardiovascular: Negative for chest pain and leg swelling.  Gastrointestinal: Negative for abdominal pain and constipation.  Genitourinary: Negative for dysuria.  Musculoskeletal: Negative for falls.  Skin: Negative for itching and rash.       Bruise on right forearm  Neurological: Negative for dizziness, tremors and loss of consciousness.       Unsteady gait  Psychiatric/Behavioral: Positive for memory loss.    Past Medical History  Diagnosis Date  . Complete AV block   . Chronic systolic heart failure   . Cardiomyopathy, ischemic   . Automatic implantable cardiac defibrillator in situ   . CAD (coronary artery disease) of artery bypass graft   . Prostate cancer     S/P radiation rx  . Osteoporosis     A/P Vertebral compression fx's  . Hypertension   . Dyslipidemia   . TIA (transient ischemic  attack)     H/o  . Lumbago 10/05/2012  . Cervicalgia 04/06/2012  . 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 Unspecified vitamin D deficiency 10/07/2011  . Unspecified pruritic disorder 10/07/2011  . Rash and other nonspecific skin eruption 10/07/2011  . Basal cell carcinoma of skin of lower limb, including hip 04/08/2011  . Squamous cell carcinoma of skin of lower limb, including hip 04/08/2011  . Unspecified sinusitis (chronic) 10/08/2010  . Impotence of organic origin 10/08/2010  . Seborrhea 04/09/2010  . Insomnia, unspecified 04/09/2010  . Altered mental status 01/12/2010  . Full incontinence of feces 10/09/2009  . Atrial fibrillation 05/15/2006  . Unspecified urinary incontinence 05/15/2006  . Sebaceous cyst 10/09/2005  . Pathologic  fracture of vertebrae 05/16/1991  . Fracture of C2 vertebra, closed     s/p cervical fusion 05/2013  . Osteoporosis with fracture 04/05/2013    History of vertebal fracture.   Marland Kitchen AICD (automatic cardioverter/defibrillator) present     Past Surgical History  Procedure Laterality Date  . Coronary artery bypass graft    . Hernia repair    . Kyphosis surgery    . Biv-icd  11/21/2010    [employer] ICD Model#CD3231-40 [phone]  . Cardiac defibrillator placement    . Cyst excision  10/2012    back Dr. Wilhemina Bonito  . Cyst removal neck  12/2012    basel cell (right) Dr. Sarajane Jews  . Colonoscopy  2006    polyps benign  . Dexa  April 2010  . Posterior cervical fusion/foraminotomy N/A 05/21/2013    Procedure: Cervical One, Cervical Two, Cervical Three Posterior cervical fusion with lateral mass fixation;  Surgeon: Charlie Pitter, MD;  Location: [facility];  Service: Neurosurgery;  Laterality: N/A;  POSTERIOR CERVICAL FUSION/FORAMINOTOMY LEVEL 2    Social History:   reports that he quit smoking about 53 years ago. He has never used smokeless tobacco. He reports that he drinks about 1.2 oz of alcohol per week. He reports that he does not use illicit drugs.  Allergies  Allergen Reactions  . Warfarin Sodium Other (See Comments)    REACTION: Stomach bleeding  . Amlodipine Besylate Swelling    Medications: Patient's Medications  New Prescriptions   No medications on file  Previous Medications   ASPIRIN 81 MG TABLET    Take 81 mg by mouth daily.   CALCIUM CARBONATE-VITAMIN D (CALCIUM 600 + D PO)    Take 1 tablet by mouth daily.    CARVEDILOL (COREG) 6.25 MG TABLET    TAKE 1 TABLET TWICE A DAY WITH A MEAL TO STRENGTHEN THE HEART   CHOLECALCIFEROL (VITAMIN D) 1000 UNITS TABLET    Take 2,000 Units by mouth daily.   DIPYRIDAMOLE (PERSANTINE) 50 MG TABLET    Take 50 mg by mouth 3 (three) times daily.   FUROSEMIDE (LASIX) 20 MG TABLET    Take 20 mg by mouth.   ISOSORBIDE-HYDRALAZINE (BIDIL) 20-37.5  MG PER TABLET    Take 1 tablet by mouth 2 (two) times daily.   LACTOSE FREE NUTRITION (BOOST) LIQD    Take 237 mLs by mouth every morning.   MELATONIN 5 MG CAPS    Take 1 capsule (5 mg total) by mouth at bedtime as needed.   MEMANTINE (NAMENDA XR) 28 MG CP24 24 HR CAPSULE    Take one tablet daily for memory   POLYETHYL GLYCOL-PROPYL GLYCOL 0.4-0.3 % GEL    Apply 1 drop to eye every morning.  POLYETHYLENE GLYCOL (MIRALAX / GLYCOLAX) PACKET    Take 17 g by mouth daily as needed for moderate constipation.   SACCHAROMYCES BOULARDII (FLORASTOR) 250 MG CAPSULE    Take 250 mg by mouth 2 (two) times daily.   SENNA-DOCUSATE (SENOKOT-S) 8.6-50 MG PER TABLET    Take 2 tablets by mouth daily.   SIMVASTATIN (ZOCOR) 20 MG TABLET    TAKE 1 TABLET BY MOUTH AT BEDTIME FOR CHOLESTEROL   TRIAMCINOLONE (KENALOG) 0.025 % CREAM    Apply 1 application topically 2 (two) times daily as needed (rash).  Modified Medications   No medications on file  Discontinued Medications   FUROSEMIDE (LASIX) 20 MG TABLET    TAKE 1 TABLET (20 MG TOTAL) BY MOUTH DAILY.     Physical Exam: Filed Vitals:   03/08/15 1425  BP: 118/72  Pulse: 68  Temp: 97.4 F (36.3 C)  TempSrc: Oral  Weight: 151 lb (68.493 kg)  SpO2: 98%   Body mass index is 22.29 kg/(m^2). Physical Exam  Constitutional: He is oriented to person, place, and time. No distress.  Tall thin white male, walking with cane today  Cardiovascular: Normal rate, regular rhythm, normal heart sounds and intact distal pulses.   Pulmonary/Chest: Effort normal and breath sounds normal. No respiratory distress.  Abdominal: Soft. Bowel sounds are normal.  Musculoskeletal: Normal range of motion. He exhibits no edema or tenderness.  Neurological: He is alert and oriented to person, place, and time. He exhibits abnormal muscle tone.  Left arm is slightly rigid vs. Right and left leg less coordinated; has somewhat flat affect, some mild hypophonia  Skin: Skin is warm and dry.    Small nickel-sized bruise of forearm  Psychiatric: He has a normal mood and affect.     Labs reviewed: Basic Metabolic Panel:  Recent Labs  04/27/14 1539  12/19/14 0125 12/19/14 1923 12/21/14 0736 12/22/14 0745 12/29/14  NA 137  < > 141 143 144 145 138  K 4.1  < > 4.1 3.6 3.7 3.6 4.7  CL 104  < > 106 105 108 108  --   CO2 26  < > 23 26 22 24   --   GLUCOSE 83  < > 117* 88 105* 105*  --   BUN 23  < > 48* 47* 35* 29* 25*  CREATININE 1.3  < > 1.86* 1.80* 1.47* 1.31 1.1  CALCIUM 9.6  < > 9.2 8.9 8.8 9.3  --   MG  --   --  2.2  --   --   --   --   TSH 1.81  --   --   --   --   --   --   < > = values in this interval not displayed. Liver Function Tests:  Recent Labs  12/19/14 0125 12/19/14 1923 12/21/14 0736 12/29/14  AST 200* 199* 106* 27  ALT 220* 257* 199* 38  ALKPHOS 64 65 63 42  BILITOT 1.9* 2.1* 1.9*  --   PROT 6.7 6.5 6.6  --   ALBUMIN 3.9 3.7 3.6  --    No results for input(s): LIPASE, AMYLASE in the last 8760 hours.  Recent Labs  12/18/14 2042  AMMONIA 20   CBC:  Recent Labs  04/27/14 1539 12/18/14 1411 12/18/14 2042 12/20/14 0704  WBC 5.1 5.7  --  6.1  NEUTROABS  --  4.5  --   --   HGB 13.1 13.3  --  13.5  HCT 39.5 40.1 42.0 43.2  MCV 95.9 96.9  --  97.7  PLT 162.0 108*  --  125*   Lipid Panel:  Recent Labs  07/05/14  CHOL 120  HDL 49  LDLCALC 63  TRIG 76   Lab Results  Component Value Date   HGBA1C * 01/13/2010    5.7 (NOTE)                                                                       According to the ADA Clinical Practice Recommendations for 2011, when HbA1c is used as a screening test:   >=6.5%   Diagnostic of Diabetes Mellitus           (if abnormal result  is confirmed)  5.7-6.4%   Increased risk of developing Diabetes Mellitus  References:Diagnosis and Classification of Diabetes Mellitus,Diabetes FIEP,3295,18(ACZYS 1):S62-S69 and Standards of Medical Care in         Diabetes - 2011,Diabetes Care,2011,34  (Suppl  1):S11-S61.   Reviewed CT brain  Patient Care Team: Gayland Curry, DO as PCP - General (Geriatric Medicine) Jarome Matin, MD as Consulting Physician (Dermatology) Evans Lance, MD as Consulting Physician (Cardiology) [facility] Earnie Larsson, MD as Consulting Physician (Neurosurgery) Bjorn Loser, MD as Consulting Physician (Urology)  Assessment/Plan 1. Vascular parkinsonism - noted some changes on exam today as well as his mild dysphagia that ST had noted (barium swallow indicated regular diet with thin liquids ok) - Ambulatory referral to Neurology to assess for this and consider sinemet trial  2. Mixed Alzheimer's and vascular dementia -memory is much improved now out of rehab and off of detrol, on namenda XR -cont same, wife assists with his needs at home  3. Overactive bladder -d/c detrol and he's tried other safer options already w/o benefit  4. Chronic atrial fibrillation -cont dipyridamole stroke prophylaxis, coreg for rate control, monitor  5. Insomnia -cont melatonin which has been safer and more effective than prior xanax  6. Dysphagia -cont regular diet with thin liquids and had been advised to take pills with puree -has done well lately in this regard  Next appt:  3 mos  Yacine Garriga L. Libbie Bartley, D.O. [facility] [address] [place],  [address] Cell Phone (Mon-Fri 8am-5pm):  [phone] On Call:  [phone] & follow prompts after 5pm & weekends Office Phone:  [phone] Office Fax:  [phone]

## 2015-03-10 ENCOUNTER — Emergency Department (HOSPITAL_COMMUNITY): Payer: Medicare Other

## 2015-03-10 ENCOUNTER — Encounter (HOSPITAL_COMMUNITY): Payer: Self-pay | Admitting: Emergency Medicine

## 2015-03-10 ENCOUNTER — Inpatient Hospital Stay (HOSPITAL_COMMUNITY)
Admission: EM | Admit: 2015-03-10 | Discharge: 2015-03-13 | DRG: 690 | Disposition: A | Discharge status: Skilled Nursing Facility | Payer: Medicare Other | Attending: Internal Medicine | Admitting: Internal Medicine

## 2015-03-10 DIAGNOSIS — R0602 Shortness of breath: Secondary | ICD-10-CM | POA: Diagnosis not present

## 2015-03-10 DIAGNOSIS — I5023 Acute on chronic systolic (congestive) heart failure: Secondary | ICD-10-CM | POA: Diagnosis not present

## 2015-03-10 DIAGNOSIS — Z7982 Long term (current) use of aspirin: Secondary | ICD-10-CM | POA: Diagnosis not present

## 2015-03-10 DIAGNOSIS — Z951 Presence of aortocoronary bypass graft: Secondary | ICD-10-CM

## 2015-03-10 DIAGNOSIS — E785 Hyperlipidemia, unspecified: Secondary | ICD-10-CM | POA: Diagnosis present

## 2015-03-10 DIAGNOSIS — R509 Fever, unspecified: Secondary | ICD-10-CM | POA: Diagnosis present

## 2015-03-10 DIAGNOSIS — I10 Essential (primary) hypertension: Secondary | ICD-10-CM | POA: Diagnosis present

## 2015-03-10 DIAGNOSIS — I4891 Unspecified atrial fibrillation: Secondary | ICD-10-CM | POA: Diagnosis present

## 2015-03-10 DIAGNOSIS — Z87891 Personal history of nicotine dependence: Secondary | ICD-10-CM

## 2015-03-10 DIAGNOSIS — I5022 Chronic systolic (congestive) heart failure: Secondary | ICD-10-CM | POA: Diagnosis present

## 2015-03-10 DIAGNOSIS — Z981 Arthrodesis status: Secondary | ICD-10-CM | POA: Diagnosis not present

## 2015-03-10 DIAGNOSIS — Z950 Presence of cardiac pacemaker: Secondary | ICD-10-CM

## 2015-03-10 DIAGNOSIS — Z66 Do not resuscitate: Secondary | ICD-10-CM | POA: Diagnosis present

## 2015-03-10 DIAGNOSIS — Z923 Personal history of irradiation: Secondary | ICD-10-CM

## 2015-03-10 DIAGNOSIS — Z95810 Presence of automatic (implantable) cardiac defibrillator: Secondary | ICD-10-CM

## 2015-03-10 DIAGNOSIS — I130 Hypertensive heart and chronic kidney disease with heart failure and stage 1 through stage 4 chronic kidney disease, or unspecified chronic kidney disease: Secondary | ICD-10-CM

## 2015-03-10 DIAGNOSIS — I129 Hypertensive chronic kidney disease with stage 1 through stage 4 chronic kidney disease, or unspecified chronic kidney disease: Secondary | ICD-10-CM | POA: Diagnosis present

## 2015-03-10 DIAGNOSIS — N289 Disorder of kidney and ureter, unspecified: Secondary | ICD-10-CM | POA: Diagnosis present

## 2015-03-10 DIAGNOSIS — N3000 Acute cystitis without hematuria: Secondary | ICD-10-CM | POA: Diagnosis present

## 2015-03-10 DIAGNOSIS — R5383 Other fatigue: Secondary | ICD-10-CM | POA: Diagnosis present

## 2015-03-10 DIAGNOSIS — Z79899 Other long term (current) drug therapy: Secondary | ICD-10-CM

## 2015-03-10 DIAGNOSIS — N189 Chronic kidney disease, unspecified: Secondary | ICD-10-CM | POA: Diagnosis present

## 2015-03-10 DIAGNOSIS — N390 Urinary tract infection, site not specified: Secondary | ICD-10-CM | POA: Insufficient documentation

## 2015-03-10 DIAGNOSIS — B9689 Other specified bacterial agents as the cause of diseases classified elsewhere: Secondary | ICD-10-CM | POA: Diagnosis not present

## 2015-03-10 DIAGNOSIS — I2510 Atherosclerotic heart disease of native coronary artery without angina pectoris: Secondary | ICD-10-CM | POA: Diagnosis present

## 2015-03-10 DIAGNOSIS — G4700 Insomnia, unspecified: Secondary | ICD-10-CM | POA: Diagnosis present

## 2015-03-10 DIAGNOSIS — I482 Chronic atrial fibrillation: Secondary | ICD-10-CM

## 2015-03-10 DIAGNOSIS — N179 Acute kidney failure, unspecified: Secondary | ICD-10-CM

## 2015-03-10 DIAGNOSIS — Z8673 Personal history of transient ischemic attack (TIA), and cerebral infarction without residual deficits: Secondary | ICD-10-CM

## 2015-03-10 DIAGNOSIS — A419 Sepsis, unspecified organism: Secondary | ICD-10-CM

## 2015-03-10 DIAGNOSIS — Z8546 Personal history of malignant neoplasm of prostate: Secondary | ICD-10-CM

## 2015-03-10 LAB — COMPREHENSIVE METABOLIC PANEL
ALBUMIN: 3.8 g/dL (ref 3.5–5.0)
ALT: 51 U/L (ref 17–63)
ANION GAP: 12 (ref 5–15)
AST: 48 U/L — ABNORMAL HIGH (ref 15–41)
Alkaline Phosphatase: 70 U/L (ref 38–126)
BILIRUBIN TOTAL: 1.2 mg/dL (ref 0.3–1.2)
BUN: 35 mg/dL — AB (ref 6–20)
CHLORIDE: 105 mmol/L (ref 101–111)
CO2: 21 mmol/L — ABNORMAL LOW (ref 22–32)
CREATININE: 1.66 mg/dL — AB (ref 0.61–1.24)
Calcium: 9.1 mg/dL (ref 8.9–10.3)
GFR calc Af Amer: 42 mL/min — ABNORMAL LOW (ref >60)
GFR calc non Af Amer: 36 mL/min — ABNORMAL LOW (ref >60)
Glucose, Bld: 105 mg/dL — ABNORMAL HIGH (ref 65–99)
Potassium: 4.3 mmol/L (ref 3.5–5.1)
Sodium: 138 mmol/L (ref 135–145)
Total Protein: 6.9 g/dL (ref 6.5–8.1)

## 2015-03-10 LAB — CBC WITH DIFFERENTIAL/PLATELET
Basophils Absolute: 0 10*3/uL (ref 0.0–0.1)
Basophils Relative: 0 % (ref 0–1)
EOS PCT: 1 % (ref 0–5)
Eosinophils Absolute: 0.1 10*3/uL (ref 0.0–0.7)
HCT: 40.5 % (ref 39.0–52.0)
Hemoglobin: 13.5 g/dL (ref 13.0–17.0)
LYMPHS ABS: 0.6 10*3/uL — AB (ref 0.7–4.0)
LYMPHS PCT: 7 % — AB (ref 12–46)
MCH: 31.8 pg (ref 26.0–34.0)
MCHC: 33.3 g/dL (ref 30.0–36.0)
MCV: 95.5 fL (ref 78.0–100.0)
Monocytes Absolute: 0.6 10*3/uL (ref 0.1–1.0)
Monocytes Relative: 6 % (ref 3–12)
Neutro Abs: 7.5 10*3/uL (ref 1.7–7.7)
Neutrophils Relative %: 86 % — ABNORMAL HIGH (ref 43–77)
PLATELETS: 151 10*3/uL (ref 150–400)
RBC: 4.24 MIL/uL (ref 4.22–5.81)
RDW: 16.4 % — AB (ref 11.5–15.5)
WBC: 8.7 10*3/uL (ref 4.0–10.5)

## 2015-03-10 LAB — URINE MICROSCOPIC-ADD ON

## 2015-03-10 LAB — URINALYSIS, ROUTINE W REFLEX MICROSCOPIC
Bilirubin Urine: NEGATIVE
GLUCOSE, UA: NEGATIVE mg/dL
Ketones, ur: 15 mg/dL — AB
NITRITE: POSITIVE — AB
PH: 5 (ref 5.0–8.0)
Protein, ur: 100 mg/dL — AB
SPECIFIC GRAVITY, URINE: 1.023 (ref 1.005–1.030)
Urobilinogen, UA: 0.2 mg/dL (ref 0.0–1.0)

## 2015-03-10 LAB — I-STAT CG4 LACTIC ACID, ED
LACTIC ACID, VENOUS: 1.08 mmol/L (ref 0.5–2.0)
Lactic Acid, Venous: 1.92 mmol/L (ref 0.5–2.0)

## 2015-03-10 MED ORDER — MEMANTINE HCL ER 28 MG PO CP24
28.0000 mg | ORAL_CAPSULE | Freq: Every day | ORAL | Status: DC
  Administered 2015-03-10 – 2015-03-13 (×4): 28 mg via ORAL
  Filled 2015-03-10 (×4): qty 1

## 2015-03-10 MED ORDER — ALUM & MAG HYDROXIDE-SIMETH 200-200-20 MG/5ML PO SUSP: 30.0000 mL | Freq: Four times a day (QID) | ORAL | Status: DC | PRN

## 2015-03-10 MED ORDER — FUROSEMIDE 20 MG PO TABS
20.0000 mg | ORAL_TABLET | Freq: Every day | ORAL | Status: DC
  Administered 2015-03-10 – 2015-03-11 (×2): 20 mg via ORAL
  Filled 2015-03-10 (×2): qty 1

## 2015-03-10 MED ORDER — VANCOMYCIN HCL IN DEXTROSE 1-5 GM/200ML-% IV SOLN
1000.0000 mg | Freq: Once | INTRAVENOUS | Status: AC
  Administered 2015-03-10: 1000 mg via INTRAVENOUS
  Filled 2015-03-10: qty 200

## 2015-03-10 MED ORDER — ACETAMINOPHEN 650 MG RE SUPP: 650.0000 mg | Freq: Four times a day (QID) | RECTAL | Status: DC | PRN

## 2015-03-10 MED ORDER — PIPERACILLIN-TAZOBACTAM 3.375 G IVPB
3.3750 g | Freq: Once | INTRAVENOUS | Status: AC
  Administered 2015-03-10: 3.375 g via INTRAVENOUS
  Filled 2015-03-10: qty 50

## 2015-03-10 MED ORDER — SODIUM CHLORIDE 0.9 % IV BOLUS (SEPSIS)
1000.0000 mL | Freq: Once | INTRAVENOUS | Status: AC
  Administered 2015-03-10: 1000 mL via INTRAVENOUS

## 2015-03-10 MED ORDER — HYDROCODONE-ACETAMINOPHEN 5-325 MG PO TABS: 1.0000 | ORAL_TABLET | ORAL | Status: DC | PRN

## 2015-03-10 MED ORDER — CHLORHEXIDINE GLUCONATE 0.12 % MT SOLN
15.0000 mL | Freq: Two times a day (BID) | OROMUCOSAL | Status: DC
  Administered 2015-03-10 – 2015-03-13 (×3): 15 mL via OROMUCOSAL
  Filled 2015-03-10 (×7): qty 15

## 2015-03-10 MED ORDER — CARVEDILOL 6.25 MG PO TABS
6.2500 mg | ORAL_TABLET | Freq: Two times a day (BID) | ORAL | Status: DC
  Administered 2015-03-11 – 2015-03-13 (×5): 6.25 mg via ORAL
  Filled 2015-03-10 (×6): qty 1

## 2015-03-10 MED ORDER — SIMVASTATIN 20 MG PO TABS
20.0000 mg | ORAL_TABLET | Freq: Every day | ORAL | Status: DC
  Administered 2015-03-11 – 2015-03-12 (×2): 20 mg via ORAL
  Filled 2015-03-10 (×3): qty 1

## 2015-03-10 MED ORDER — DEXTROSE 5 % IV SOLN
1.0000 g | INTRAVENOUS | Status: DC
  Administered 2015-03-11 – 2015-03-13 (×3): 1 g via INTRAVENOUS
  Filled 2015-03-10 (×4): qty 10

## 2015-03-10 MED ORDER — SODIUM CHLORIDE 0.9 % IJ SOLN
3.0000 mL | Freq: Two times a day (BID) | INTRAMUSCULAR | Status: DC
  Administered 2015-03-11 – 2015-03-13 (×5): 3 mL via INTRAVENOUS

## 2015-03-10 MED ORDER — ACETAMINOPHEN 325 MG PO TABS: 650.0000 mg | ORAL_TABLET | Freq: Four times a day (QID) | ORAL | Status: DC | PRN

## 2015-03-10 MED ORDER — MELATONIN 3 MG PO TABS
3.0000 mg | ORAL_TABLET | Freq: Every evening | ORAL | Status: DC | PRN
  Administered 2015-03-10 – 2015-03-12 (×2): 3 mg via ORAL
  Filled 2015-03-10 (×5): qty 1

## 2015-03-10 MED ORDER — BISACODYL 5 MG PO TBEC
5.0000 mg | DELAYED_RELEASE_TABLET | Freq: Every day | ORAL | Status: DC | PRN
  Filled 2015-03-10: qty 1

## 2015-03-10 MED ORDER — MELATONIN 5 MG PO CAPS: 1.0000 | ORAL_CAPSULE | Freq: Every evening | ORAL | Status: DC | PRN

## 2015-03-10 MED ORDER — ACETAMINOPHEN 650 MG RE SUPP
650.0000 mg | Freq: Once | RECTAL | Status: AC
Start: 2015-03-10 — End: 2015-03-10
  Administered 2015-03-10: 650 mg via RECTAL
  Filled 2015-03-10: qty 1

## 2015-03-10 MED ORDER — ONDANSETRON HCL 4 MG/2ML IJ SOLN: 4.0000 mg | Freq: Four times a day (QID) | INTRAMUSCULAR | Status: DC | PRN

## 2015-03-10 MED ORDER — SENNOSIDES-DOCUSATE SODIUM 8.6-50 MG PO TABS
2.0000 | ORAL_TABLET | Freq: Every day | ORAL | Status: DC
  Administered 2015-03-10 – 2015-03-13 (×4): 2 via ORAL
  Filled 2015-03-10 (×4): qty 2

## 2015-03-10 MED ORDER — CETYLPYRIDINIUM CHLORIDE 0.05 % MT LIQD
7.0000 mL | Freq: Two times a day (BID) | OROMUCOSAL | Status: DC
  Administered 2015-03-12: 7 mL via OROMUCOSAL

## 2015-03-10 MED ORDER — LISINOPRIL 20 MG PO TABS
20.0000 mg | ORAL_TABLET | Freq: Two times a day (BID) | ORAL | Status: DC
  Administered 2015-03-10: 20 mg via ORAL
  Filled 2015-03-10: qty 1

## 2015-03-10 MED ORDER — ONDANSETRON HCL 4 MG PO TABS: 4.0000 mg | ORAL_TABLET | Freq: Four times a day (QID) | ORAL | Status: DC | PRN

## 2015-03-10 MED ORDER — HEPARIN SODIUM (PORCINE) 5000 UNIT/ML IJ SOLN
5000.0000 [IU] | Freq: Three times a day (TID) | INTRAMUSCULAR | Status: DC
  Administered 2015-03-10 – 2015-03-13 (×8): 5000 [IU] via SUBCUTANEOUS
  Filled 2015-03-10 (×13): qty 1

## 2015-03-10 NOTE — H&P (Addendum)
[facility] History and Physical  AKRAM KISSICK DUK:[id number] DOB: [birth year] DOA: 03/10/2015  Referring physician: ED physician PCP: Hollace Kinnier, DO   Chief Complaint: SOB, coughing, fatigue   HPI:  Jacob Rosales is an 81yo man with PMH of chronic systolic HF with ICD/PCM, osteoporosis, HTN, CAD, insomnia who presents with acute onset of SOB, cough and fatigue.  His wife reports that he just "didn't feel right."  Further symptoms included shaking and feeling cold.  He lives at [facility] and they had a nurse come check him.  At that time, he did not have a fever.  He was placed on O2 and he felt better.  He was brought to the ED for evaluation.  In the ED, he did have a temperature up to 102 F.  His wife was concerned, as he was admitted last April with a PNA and difficulty swallowing afterwards (discharge summary lists only acute on chronic systolic failure, however).  She was concerned he might have aspirated.  [name] reports no furtehr symptoms and specifically denies choking, nausea, vomiting, chest pain, dysuria, abdominal pain, CVA tenderness, swelling in his legs, rash or wound, diarrhea.  In the ED, he was found to be febrile, tachypneic on one reading.  He had a normal WBC, however, UA was nitrite positive.  Given his symptoms of SOB and cough, he was placed on broad spectrum Abx with Vancomycin and Zosyn.  Cultures were sent.  Lactic acid was 1.92.  He was given a 1L bolus of fluid, BP has been stable.    Assessment and Plan: Acute cystitis, nitrite + UA with fever - 1 SIRS criteria met (fever), and one episode of tachypnea to 44.  WBC WNL, but with left shift.  He does not have AMS - Patient received broad spectrum Abx in the ED - UA is most concerning issue right now, so have decreased Abx to Rocephin for UTI - UC and BC pending - LA normal - CXR shows only interstitial edema, likely from his chronic systolic heart failure - Received 1L of NS in the ED, will d/c fluids at this  time as BP and HR are normalized and he has severe CHF - I am concerned that his SOB may be more related to his CHF than a pneumonia.  Will need to monitor fluid status closely - Follow up cultures - Oxygen to maintain pOx > 92%  AKI on CKD - Patient's baseline fluctuates, ranging from 1.1 to 1.5 - up to 1.66 from 1.1 at last check in April - Received 1L NS in the ED and antibiotics - Possibly in the setting of acute cystitis as well - Holding lisinopril give renal function - Repeat BMET in the AM  SOB (shortness of breath), cough - He apparently has had issues with dysphagia and swallowing after last hospital stay, but denies choking - He was eating a sandwich without issue when I saw him and drinking thin liquids.  - Repeat CXR in the AM    Chronic systolic heart failure with ICD - EF 25% on TTE from earlier this year - Continue home lasix, coreg.  Holding lisinopril given renal function - Hold BP medications if BP should drop - Patient reported not taking Bidil, so I did not start it.  - Strict I/Os - Daily weights  H/O Afib, PCM in place - Telemetry - Continue coreg - He reports being on aggrenox and not aspirin, but cannot find that in system.  Will need to  be confirmed tomorrow and restarted as needed    Essential hypertension - See above, continue home medications at this time as BP has been normal to high    Insomnia - Continue home melatonin       Radiological Exams on Admission: Dg Chest 2 View  03/10/2015   CLINICAL DATA:  81 year old male with acute shortness of breath. Fever today.  EXAM: CHEST  2 VIEW  COMPARISON:  12/22/2014 and prior chest radiographs  FINDINGS: Cardiomegaly, CABG changes and left pacemaker AICD again noted.  Mild pulmonary vascular congestion is noted as well as possible mild interstitial edema.  A small right pleural effusion is now noted.  There is no evidence of pneumothorax or pulmonary mass.  Compression fractures of the thoracic and  lumbar spine are again identified with vertebral augmentation changes of a thoracic vertebra.  IMPRESSION: Cardiomegaly with pulmonary vascular congestion and possible mild interstitial pulmonary edema.  New small right pleural effusion.   Electronically Signed   By: Margarette Canada M.D.   On: 03/10/2015 18:58   Code Status: DNR, yellow DNR sheet reviewed along with MOST form Family Communication: Pt and wife at bedside Disposition Plan: Admit for further evaluation    Gilles Chiquito, MD [id number]   Review of Systems:  Constitutional:+ for fever, chills, malaise. Negative for diaphoresis.  HENT: Negative for hearing loss, ear pain Eyes: Negative for blurred vision, double vision, photophobia Respiratory: + for cough, SOB Negative for hemoptysis, sputum production, wheezing and stridor.   Cardiovascular: Negative for chest pain, palpitations, orthopnea, leg swelling.  Gastrointestinal: Negative for nausea, vomiting and abdominal pain. Negative for heartburn, constipation Genitourinary: Negative for dysuria, urgency, frequency Musculoskeletal: Negative for myalgias, back pain, joint pain and falls.  Skin: Negative for itching and rash.  Neurological: Negative for dizziness and weakness Endo/Heme/Allergies: Negative for environmental allergies and polydipsia. Does not bruise/bleed easily.      Past Medical History  Diagnosis Date  . Complete AV block   . Chronic systolic heart failure   . Cardiomyopathy, ischemic   . Automatic implantable cardiac defibrillator in situ   . CAD (coronary artery disease) of artery bypass graft   . Prostate cancer     S/P radiation rx  . Osteoporosis     A/P Vertebral compression fx's  . Hypertension   . Dyslipidemia   . TIA (transient ischemic attack)     H/o  . Lumbago 10/05/2012  . Cervicalgia 04/06/2012  . 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 Unspecified vitamin D deficiency 10/07/2011  . Unspecified pruritic disorder 10/07/2011  .  Rash and other nonspecific skin eruption 10/07/2011  . Basal cell carcinoma of skin of lower limb, including hip 04/08/2011  . Squamous cell carcinoma of skin of lower limb, including hip 04/08/2011  . Unspecified sinusitis (chronic) 10/08/2010  . Impotence of organic origin 10/08/2010  . Seborrhea 04/09/2010  . Insomnia, unspecified 04/09/2010  . Altered mental status 01/12/2010  . Full incontinence of feces 10/09/2009  . Atrial fibrillation 05/15/2006  . Unspecified urinary incontinence 05/15/2006  . Sebaceous cyst 10/09/2005  . Pathologic fracture of vertebrae 05/16/1991  . Fracture of C2 vertebra, closed     s/p cervical fusion 05/2013  . Osteoporosis with fracture 04/05/2013    History of vertebal fracture.   Marland Kitchen AICD (automatic cardioverter/defibrillator) present     Past Surgical History  Procedure Laterality Date  . Coronary artery bypass graft    . Hernia repair    . Kyphosis surgery    .  Biv-icd  11/21/2010    [employer] ICD Model#CD3231-40 [phone]  . Cardiac defibrillator placement    . Cyst excision  10/2012    back Dr. Wilhemina Bonito  . Cyst removal neck  12/2012    basel cell (right) Dr. Sarajane Jews  . Colonoscopy  2006    polyps benign  . Dexa  April 2010  . Posterior cervical fusion/foraminotomy N/A 05/21/2013    Procedure: Cervical One, Cervical Two, Cervical Three Posterior cervical fusion with lateral mass fixation;  Surgeon: Charlie Pitter, MD;  Location: [place];  Service: Neurosurgery;  Laterality: N/A;  POSTERIOR CERVICAL FUSION/FORAMINOTOMY LEVEL 2    Social History:  reports that he quit smoking about 53 years ago. He has never used smokeless tobacco. He reports that he drinks about 1.2 oz of alcohol per week. He reports that he does not use illicit drugs.  Allergies  Allergen Reactions  . Warfarin Sodium Other (See Comments)    REACTION: Stomach bleeding  . Amlodipine Besylate Swelling    Family History  Problem Relation Age of Onset  . Coronary artery disease Neg Hx    . Heart disease Mother     Prior to Admission medications   Medication Sig Start Date End Date Taking? Authorizing Provider  Calcium Carbonate-Vitamin D (CALCIUM 600 + D PO) Take 1 tablet by mouth daily.    Yes Historical Provider, MD  carvedilol (COREG) 6.25 MG tablet TAKE 1 TABLET TWICE A DAY WITH A MEAL TO STRENGTHEN THE HEART 01/31/15  Yes Lauree Chandler, NP  cholecalciferol (VITAMIN D) 1000 UNITS tablet Take 1,000 Units by mouth daily.    Yes Historical Provider, MD  furosemide (LASIX) 20 MG tablet Take 20 mg by mouth.   Yes Historical Provider, MD  lisinopril (PRINIVIL,ZESTRIL) 20 MG tablet Take 20 mg by mouth 2 (two) times daily.   Yes Historical Provider, MD  Melatonin 5 MG CAPS Take 1 capsule (5 mg total) by mouth at bedtime as needed. 12/27/14  Yes Tiffany L Reed, DO  memantine (NAMENDA XR) 28 MG CP24 24 hr capsule Take one tablet daily for memory 01/18/15  Yes Tiffany L Reed, DO  senna-docusate (SENOKOT-S) 8.6-50 MG per tablet Take 2 tablets by mouth daily. 05/24/13  Yes Claudette Jeri Cos, NP  simvastatin (ZOCOR) 20 MG tablet TAKE 1 TABLET BY MOUTH AT BEDTIME FOR CHOLESTEROL 12/01/14  Yes Tiffany L Reed, DO  triamcinolone (KENALOG) 0.025 % cream Apply 1 application topically 2 (two) times daily as needed (rash).   Yes Historical Provider, MD  aspirin 81 MG tablet Take 81 mg by mouth daily.    Historical Provider, MD  isosorbide-hydrALAZINE (BIDIL) 20-37.5 MG per tablet Take 1 tablet by mouth 2 (two) times daily. Patient not taking: Reported on 03/10/2015 12/23/14   Domenic Polite, MD  Patient reports being on Aggrenox and not aspirin.    Physical Exam: Filed Vitals:   03/10/15 1945 03/10/15 2015 03/10/15 2113 03/10/15 2146  BP: 133/78 143/91 136/85 147/88  Pulse: 70 74 63 75  Temp:   97.6 F (36.4 C) 97.9 F (36.6 C)  TempSrc:    Oral  Resp: 22 32 18 18  Height:    6' 1"  (1.854 m)  Weight:      SpO2: 95% 96% 94% 95%    Physical Exam  Constitutional:  Thin, elderly  gentleman. No distress.  HENT: Normocephalic. Oropharynx is clear and moist.  Eyes: Conjunctivae and EOM are normal CVS: Regular, normal rate, S1/S2 +, no  murmurs, no gallops.  PCM in place, pocket is clean dry and not erythematous.  Pulmonary: Effort and breath sounds normal,  rhonchi, wheezes.  Some mild rales in bases Abdominal: Soft. BS +,  no distension, tenderness Musculoskeletal: No edema and no tenderness.  Some chronic discoloration to shins Neuro: Alert.  No cranial nerve deficit. Skin: Skin is warm and dry. No rash noted.  Psychiatric: Normal mood and affect. Behavior, judgment, thought content normal.   Labs on Admission:  Basic Metabolic Panel:  Recent Labs Lab 03/10/15 1807  NA 138  K 4.3  CL 105  CO2 21*  GLUCOSE 105*  BUN 35*  CREATININE 1.66*  CALCIUM 9.1   Liver Function Tests:  Recent Labs Lab 03/10/15 1807  AST 48*  ALT 51  ALKPHOS 70  BILITOT 1.2  PROT 6.9  ALBUMIN 3.8   CBC:  Recent Labs Lab 03/10/15 1807  WBC 8.7  NEUTROABS 7.5  HGB 13.5  HCT 40.5  MCV 95.5  PLT 151    Urinalysis    Component Value Date/Time   COLORURINE AMBER* 03/10/2015 1903   APPEARANCEUR CLOUDY* 03/10/2015 1903   LABSPEC 1.023 03/10/2015 1903   PHURINE 5.0 03/10/2015 1903   GLUCOSEU NEGATIVE 03/10/2015 1903   HGBUR MODERATE* 03/10/2015 1903   BILIRUBINUR NEGATIVE 03/10/2015 1903   KETONESUR 15* 03/10/2015 1903   PROTEINUR 100* 03/10/2015 1903   UROBILINOGEN 0.2 03/10/2015 1903   NITRITE POSITIVE* 03/10/2015 1903   LEUKOCYTESUR SMALL* 03/10/2015 1903    EKG: Regular paced rhythm.    If 7PM-7AM, please contact night-coverage [email or web address] Password [facility] 03/10/2015, 10:23 PM

## 2015-03-10 NOTE — ED Notes (Signed)
Spoke to Dr. Venora Maples, patient is allowed to eat at this time, planning for admission. Meal provided and admitting MD at the bedside.

## 2015-03-10 NOTE — ED Notes (Signed)
MD at bedside. 

## 2015-03-10 NOTE — ED Notes (Signed)
Per EMS, pt coming in from [facility] for sudden onset of SOB 30 minutes before ems arrival. Pt denies all other symptoms. Pt has hx of CHF and has a Secretary/administrator. Pts lung sounds were clear. Pt alert x4. NAD at this time.

## 2015-03-10 NOTE — Progress Notes (Signed)
Received report from [place], RN in ED for transfer of pt to [phone].

## 2015-03-10 NOTE — ED Provider Notes (Signed)
CSN: [id number]     Arrival date & time 03/10/15  1655 History   First MD Initiated Contact with Patient 03/10/15 1657     Chief Complaint  Patient presents with  . Shortness of Breath      HPI Patient presents to the emergency department complaining of shortness of breath and cough.  He also was noted to have a fever of 102.1 on arrival to emergency department.  His wife reports that today he just began "not feeling good".  Patient denies abdominal pain.  He denies recent sick contacts.  Denies vomiting and diarrhea.  Patient denies neck pain or neck stiffness.  Family reports no altered mental status.  Family agrees that his respiratory rate is increased at this time.    Past Medical History  Diagnosis Date  . Complete AV block   . Chronic systolic heart failure   . Cardiomyopathy, ischemic   . Automatic implantable cardiac defibrillator in situ   . CAD (coronary artery disease) of artery bypass graft   . Prostate cancer     S/P radiation rx  . Osteoporosis     A/P Vertebral compression fx's  . Hypertension   . Dyslipidemia   . TIA (transient ischemic attack)     H/o  . Lumbago 10/05/2012  . Cervicalgia 04/06/2012  . 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 Unspecified vitamin D deficiency 10/07/2011  . Unspecified pruritic disorder 10/07/2011  . Rash and other nonspecific skin eruption 10/07/2011  . Basal cell carcinoma of skin of lower limb, including hip 04/08/2011  . Squamous cell carcinoma of skin of lower limb, including hip 04/08/2011  . Unspecified sinusitis (chronic) 10/08/2010  . Impotence of organic origin 10/08/2010  . Seborrhea 04/09/2010  . Insomnia, unspecified 04/09/2010  . Altered mental status 01/12/2010  . Full incontinence of feces 10/09/2009  . Atrial fibrillation 05/15/2006  . Unspecified urinary incontinence 05/15/2006  . Sebaceous cyst 10/09/2005  . Pathologic fracture of vertebrae 05/16/1991  . Fracture of C2 vertebra, closed     s/p cervical fusion 05/2013   . Osteoporosis with fracture 04/05/2013    History of vertebal fracture.   Marland Kitchen AICD (automatic cardioverter/defibrillator) present    Past Surgical History  Procedure Laterality Date  . Coronary artery bypass graft    . Hernia repair    . Kyphosis surgery    . Biv-icd  11/21/2010    [employer] ICD Model#CD3231-40 [phone]  . Cardiac defibrillator placement    . Cyst excision  10/2012    back Dr. Wilhemina Bonito  . Cyst removal neck  12/2012    basel cell (right) Dr. Sarajane Jews  . Colonoscopy  2006    polyps benign  . Dexa  April 2010  . Posterior cervical fusion/foraminotomy N/A 05/21/2013    Procedure: Cervical One, Cervical Two, Cervical Three Posterior cervical fusion with lateral mass fixation;  Surgeon: Charlie Pitter, MD;  Location: [place];  Service: Neurosurgery;  Laterality: N/A;  POSTERIOR CERVICAL FUSION/FORAMINOTOMY LEVEL 2   Family History  Problem Relation Age of Onset  . Coronary artery disease Neg Hx   . Heart disease Mother    History  Substance Use Topics  . Smoking status: Former Smoker    Quit date: 01/13/1962  . Smokeless tobacco: Never Used  . Alcohol Use: 1.2 oz/week    1 Glasses of wine, 1 Cans of beer per week    Review of Systems  All other systems reviewed and are negative.  Allergies  Warfarin sodium and Amlodipine besylate  Home Medications   Prior to Admission medications   Medication Sig Start Date End Date Taking? Authorizing Provider  Calcium Carbonate-Vitamin D (CALCIUM 600 + D PO) Take 1 tablet by mouth daily.    Yes Historical Provider, MD  carvedilol (COREG) 6.25 MG tablet TAKE 1 TABLET TWICE A DAY WITH A MEAL TO STRENGTHEN THE HEART 01/31/15  Yes Lauree Chandler, NP  cholecalciferol (VITAMIN D) 1000 UNITS tablet Take 1,000 Units by mouth daily.    Yes Historical Provider, MD  furosemide (LASIX) 20 MG tablet Take 20 mg by mouth.   Yes Historical Provider, MD  lisinopril (PRINIVIL,ZESTRIL) 20 MG tablet Take 20 mg by mouth 2 (two)  times daily.   Yes Historical Provider, MD  Melatonin 5 MG CAPS Take 1 capsule (5 mg total) by mouth at bedtime as needed. 12/27/14  Yes Tiffany L Reed, DO  memantine (NAMENDA XR) 28 MG CP24 24 hr capsule Take one tablet daily for memory 01/18/15  Yes Tiffany L Reed, DO  senna-docusate (SENOKOT-S) 8.6-50 MG per tablet Take 2 tablets by mouth daily. 05/24/13  Yes Claudette Jeri Cos, NP  simvastatin (ZOCOR) 20 MG tablet TAKE 1 TABLET BY MOUTH AT BEDTIME FOR CHOLESTEROL 12/01/14  Yes Tiffany L Reed, DO  triamcinolone (KENALOG) 0.025 % cream Apply 1 application topically 2 (two) times daily as needed (rash).   Yes Historical Provider, MD  aspirin 81 MG tablet Take 81 mg by mouth daily.    Historical Provider, MD  isosorbide-hydrALAZINE (BIDIL) 20-37.5 MG per tablet Take 1 tablet by mouth 2 (two) times daily. Patient not taking: Reported on 03/10/2015 12/23/14   Domenic Polite, MD   BP 144/108 mmHg  Pulse 80  Temp(Src) 102.1 F (38.9 C) (Rectal)  Resp 21  Wt 136 lb 0.4 oz (61.7 kg)  SpO2 97% Physical Exam  Constitutional: He is oriented to person, place, and time. He appears well-developed and well-nourished.  HENT:  Head: Normocephalic and atraumatic.  Eyes: EOM are normal.  Neck: Normal range of motion.  Cardiovascular: Normal rate, regular rhythm, normal heart sounds and intact distal pulses.   Pulmonary/Chest: Effort normal and breath sounds normal. No respiratory distress.  Tachypnea  Abdominal: Soft. He exhibits no distension. There is no tenderness.  Musculoskeletal: Normal range of motion.  Neurological: He is alert and oriented to person, place, and time.  Skin: Skin is warm and dry.  Psychiatric: He has a normal mood and affect. Judgment normal.  Nursing note and vitals reviewed.   ED Course  Procedures (including critical care time) Labs Review Labs Reviewed  CBC WITH DIFFERENTIAL/PLATELET - Abnormal; Notable for the following:    RDW 16.4 (*)    Neutrophils Relative % 86 (*)     Lymphocytes Relative 7 (*)    Lymphs Abs 0.6 (*)    All other components within normal limits  COMPREHENSIVE METABOLIC PANEL - Abnormal; Notable for the following:    CO2 21 (*)    Glucose, Bld 105 (*)    BUN 35 (*)    Creatinine, Ser 1.66 (*)    AST 48 (*)    GFR calc non Af Amer 36 (*)    GFR calc Af Amer 42 (*)    All other components within normal limits  URINALYSIS, ROUTINE W REFLEX MICROSCOPIC (NOT AT [facility]) - Abnormal; Notable for the following:    Color, Urine AMBER (*)    APPearance CLOUDY (*)    Hgb urine  dipstick MODERATE (*)    Ketones, ur 15 (*)    Protein, ur 100 (*)    Nitrite POSITIVE (*)    Leukocytes, UA SMALL (*)    All other components within normal limits  URINE MICROSCOPIC-ADD ON - Abnormal; Notable for the following:    Bacteria, UA MANY (*)    Casts HYALINE CASTS (*)    All other components within normal limits  CULTURE, BLOOD (ROUTINE X 2)  CULTURE, BLOOD (ROUTINE X 2)  URINE CULTURE  I-STAT CG4 LACTIC ACID, ED  I-STAT CG4 LACTIC ACID, ED    Imaging Review Dg Chest 2 View  03/10/2015   CLINICAL DATA:  81 year old male with acute shortness of breath. Fever today.  EXAM: CHEST  2 VIEW  COMPARISON:  12/22/2014 and prior chest radiographs  FINDINGS: Cardiomegaly, CABG changes and left pacemaker AICD again noted.  Mild pulmonary vascular congestion is noted as well as possible mild interstitial edema.  A small right pleural effusion is now noted.  There is no evidence of pneumothorax or pulmonary mass.  Compression fractures of the thoracic and lumbar spine are again identified with vertebral augmentation changes of a thoracic vertebra.  IMPRESSION: Cardiomegaly with pulmonary vascular congestion and possible mild interstitial pulmonary edema.  New small right pleural effusion.   Electronically Signed   By: Margarette Canada M.D.   On: 03/10/2015 18:58  I personally reviewed the imaging tests through PACS system I reviewed available ER/hospitalization records  through the EMR    EKG Interpretation None      MDM   Final diagnoses:  Fever, unspecified fever cause  Renal insufficiency  Sepsis, due to unspecified organism   Patient with fever and tachypnea.  Lactate is 1.92.  Possible sources include pneumonia not evident on x-ray yet given his complaint of cough and shortness of breath.  The possibility would be urinary tract infection given nitrite positive urine and many bacteria.  It is likely developing urosepsis.  Patient will be admitted the hospital.  Thank myself and Zosyn.  Blood and urine cultures.    Jola Schmidt, MD 03/10/15 2032

## 2015-03-11 ENCOUNTER — Inpatient Hospital Stay (HOSPITAL_COMMUNITY): Payer: Medicare Other

## 2015-03-11 DIAGNOSIS — R0602 Shortness of breath: Secondary | ICD-10-CM

## 2015-03-11 DIAGNOSIS — G4700 Insomnia, unspecified: Secondary | ICD-10-CM

## 2015-03-11 DIAGNOSIS — N3000 Acute cystitis without hematuria: Principal | ICD-10-CM

## 2015-03-11 DIAGNOSIS — I10 Essential (primary) hypertension: Secondary | ICD-10-CM

## 2015-03-11 DIAGNOSIS — I5022 Chronic systolic (congestive) heart failure: Secondary | ICD-10-CM

## 2015-03-11 LAB — BASIC METABOLIC PANEL
Anion gap: 11 (ref 5–15)
BUN: 30 mg/dL — ABNORMAL HIGH (ref 6–20)
CO2: 21 mmol/L — ABNORMAL LOW (ref 22–32)
CREATININE: 1.51 mg/dL — AB (ref 0.61–1.24)
Calcium: 8.7 mg/dL — ABNORMAL LOW (ref 8.9–10.3)
Chloride: 107 mmol/L (ref 101–111)
GFR calc Af Amer: 47 mL/min — ABNORMAL LOW (ref >60)
GFR calc non Af Amer: 41 mL/min — ABNORMAL LOW (ref >60)
Glucose, Bld: 109 mg/dL — ABNORMAL HIGH (ref 65–99)
Potassium: 3.6 mmol/L (ref 3.5–5.1)
Sodium: 139 mmol/L (ref 135–145)

## 2015-03-11 LAB — CBC
HCT: 40.7 % (ref 39.0–52.0)
HEMOGLOBIN: 13.1 g/dL (ref 13.0–17.0)
MCH: 30.5 pg (ref 26.0–34.0)
MCHC: 32.2 g/dL (ref 30.0–36.0)
MCV: 94.9 fL (ref 78.0–100.0)
Platelets: 144 10*3/uL — ABNORMAL LOW (ref 150–400)
RBC: 4.29 MIL/uL (ref 4.22–5.81)
RDW: 16.5 % — ABNORMAL HIGH (ref 11.5–15.5)
WBC: 6.7 10*3/uL (ref 4.0–10.5)

## 2015-03-11 LAB — MRSA PCR SCREENING: MRSA by PCR: NEGATIVE

## 2015-03-11 MED ORDER — FUROSEMIDE 10 MG/ML IJ SOLN
40.0000 mg | Freq: Once | INTRAMUSCULAR | Status: AC
  Administered 2015-03-11: 40 mg via INTRAVENOUS
  Filled 2015-03-11: qty 4

## 2015-03-11 MED ORDER — FUROSEMIDE 10 MG/ML IJ SOLN: 40.0000 mg | Freq: Every day | INTRAMUSCULAR | Status: DC

## 2015-03-11 MED ORDER — FUROSEMIDE 20 MG PO TABS
20.0000 mg | ORAL_TABLET | Freq: Every day | ORAL | Status: DC
  Administered 2015-03-11: 20 mg via ORAL
  Filled 2015-03-11 (×2): qty 1

## 2015-03-11 NOTE — Progress Notes (Signed)
Pt arrived to unit alert and oriented x4. Oriented to room, unit, and staff.  Bed in lowest position and call bell is within reach. Will continue to monitor. 

## 2015-03-11 NOTE — Progress Notes (Signed)
Patient Demographics  Jacob Rosales, is a 81 y.o. male, DOB - 21-Aug-1928, WIO:[id number]  Admit date - 03/10/2015   Admitting Physician Sid Falcon, MD  Outpatient Primary MD for the patient is REED, TIFFANY, DO  LOS - 1   Chief Complaint  Patient presents with  . Shortness of Breath       Admission HPI/Brief narrative: 81yo man with PMH of chronic systolic HF with ICD/PCM, osteoporosis, HTN, CAD, insomnia who presents with acute onset of SOB, cough and fatigue. Fever of 102, workup was significant for UTI, as well as his x-ray showing evidence of volume overload.  Subjective:   Jacob Rosales today has, No headache, No chest pain, No abdominal pain - No Nausea, No new weakness tingling or numbness, but he denies any shortness of breath.  Assessment & Plan    Active Problems:   Chronic systolic heart failure   Essential hypertension   SOB (shortness of breath)   Insomnia   Acute cystitis without hematuria   Pyrexia   UTI - Continue with IV Rocephin, follow on urine cultures   Acute on chronic renal failure -  baseline creatinine 1.1-1.5 , continue to hold lisinopril, will monitor closely as on Lasix   Dyspnea and cough  - X-ray showing evidence of volume overload , new with Lasix .  Chronic systolic heart failure with ICD  - EF is 25% on echo earlier this year  - Continue with Coreg, BiDil, will give one dose of IV Lasix giving evidence of volume overload on chest x-ray , continue to hold lisinopril given his renal function. - Daily weights, strict ins and outs    History of atrial fibrillation - Continue with Coreg, not on anticoagulation secondary to GI bleeding in the past, currently on aspirin.asked pharmacy to check if patient is on Aggrenox or not.  Essential hypertension  -Continue with Coreg, lisinopril, resume BiDil    insomnia - Continue with melatonin  Code Status:  DNR  Family Communication: none at bedside  Disposition Plan: pending further work up.   Procedures  none   Consults  none   Medications  Scheduled Meds: . antiseptic oral rinse  7 mL Mouth Rinse q12n4p  . carvedilol  6.25 mg Oral BID WC  . cefTRIAXone (ROCEPHIN) 1 g IVPB  1 g Intravenous Q24H  . chlorhexidine  15 mL Mouth Rinse BID  . furosemide  40 mg Intravenous Daily  . heparin  5,000 Units Subcutaneous 3 times per day  . memantine  28 mg Oral Daily  . senna-docusate  2 tablet Oral Daily  . simvastatin  20 mg Oral q1800  . sodium chloride  3 mL Intravenous Q12H   Continuous Infusions:  PRN Meds:.acetaminophen **OR** acetaminophen, alum & mag hydroxide-simeth, bisacodyl, HYDROcodone-acetaminophen, Melatonin, ondansetron **OR** ondansetron (ZOFRAN) IV  DVT Prophylaxis   Heparin   Lab Results  Component Value Date   PLT 144* 03/11/2015    Antibiotics    Anti-infectives    Start     Dose/Rate Route Frequency Ordered Stop   03/11/15 0600  cefTRIAXone (ROCEPHIN) 1 g in dextrose 5 % 50 mL IVPB     1 g 120 mL/hr over 30 Minutes Intravenous Every 24 hours 03/10/15 2158  03/10/15 2030  vancomycin (VANCOCIN) IVPB 1000 mg/200 mL premix     1,000 mg 200 mL/hr over 60 Minutes Intravenous  Once 03/10/15 2026 03/10/15 2143   03/10/15 2030  piperacillin-tazobactam (ZOSYN) IVPB 3.375 g     3.375 g 12.5 mL/hr over 240 Minutes Intravenous  Once 03/10/15 2026 03/11/15 0043          Objective:   Filed Vitals:   03/11/15 0458 03/11/15 0459 03/11/15 0500 03/11/15 1024  BP:  136/81  154/95  Pulse:  68  72  Temp: 97.9 F (36.6 C)     TempSrc: Oral     Resp:  18    Height:      Weight:   67.9 kg (149 lb 11.1 oz)   SpO2:  95%  97%    Wt Readings from Last 3 Encounters:  03/11/15 67.9 kg (149 lb 11.1 oz)  03/08/15 68.493 kg (151 lb)  01/25/15 67.132 kg (148 lb)     Intake/Output Summary (Last 24 hours) at 03/11/15 1422 Last data filed at 03/11/15 1035  Gross  per 24 hour  Intake     60 ml  Output    925 ml  Net   -865 ml     Physical Exam  Awake Alert, Oriented,  Normal affect [place].AT,PERRAL Supple Neck,No JVD,  Symmetrical Chest wall movement, Good air movement bilaterally,  No Gallops,Rubs or new Murmurs, No Parasternal Heave +ve B.Sounds, Abd Soft, No tenderness, No organomegaly appriciated,  No Cyanosis, Clubbing or edema,    Data Review   Micro Results Recent Results (from the past 240 hour(s))  Blood culture (routine x 2)     Status: None (Preliminary result)   Collection Time: 03/10/15  6:10 PM  Result Value Ref Range Status   Specimen Description BLOOD LEFT ARM  Final   Special Requests BOTTLES DRAWN AEROBIC AND ANAEROBIC 5ML EACH  Final   Culture NO GROWTH < 24 HOURS  Final   Report Status PENDING  Incomplete  MRSA PCR Screening     Status: None   Collection Time: 03/10/15  9:56 PM  Result Value Ref Range Status   MRSA by PCR NEGATIVE NEGATIVE Final    Comment:        The GeneXpert MRSA Assay (FDA approved for NASAL specimens only), is one component of a comprehensive MRSA colonization surveillance program. It is not intended to diagnose MRSA infection nor to guide or monitor treatment for MRSA infections.     Radiology Reports Dg Chest 2 View  03/11/2015   CLINICAL DATA:  Shortness of breath, history of CHF  EXAM: CHEST  2 VIEW  COMPARISON:  03/10/2015  FINDINGS: Cardiomegaly with pulmonary vascular congestion and mild interstitial edema.  Small right pleural effusion layering along the right major fissure. No pneumothorax.  Left chest ICD.  Postsurgical changes related to prior CABG.  IMPRESSION: Cardiomegaly with mild interstitial edema and small right pleural effusion.   Electronically Signed   By: Julian Hy M.D.   On: 03/11/2015 09:07   Dg Chest 2 View  03/10/2015   CLINICAL DATA:  81 year old male with acute shortness of breath. Fever today.  EXAM: CHEST  2 VIEW  COMPARISON:  12/22/2014 and prior chest  radiographs  FINDINGS: Cardiomegaly, CABG changes and left pacemaker AICD again noted.  Mild pulmonary vascular congestion is noted as well as possible mild interstitial edema.  A small right pleural effusion is now noted.  There is no evidence of pneumothorax or pulmonary mass.  Compression fractures of the thoracic and lumbar spine are again identified with vertebral augmentation changes of a thoracic vertebra.  IMPRESSION: Cardiomegaly with pulmonary vascular congestion and possible mild interstitial pulmonary edema.  New small right pleural effusion.   Electronically Signed   By: Margarette Canada M.D.   On: 03/10/2015 18:58     CBC  Recent Labs Lab 03/10/15 1807 03/11/15 0425  WBC 8.7 6.7  HGB 13.5 13.1  HCT 40.5 40.7  PLT 151 144*  MCV 95.5 94.9  MCH 31.8 30.5  MCHC 33.3 32.2  RDW 16.4* 16.5*  LYMPHSABS 0.6*  --   MONOABS 0.6  --   EOSABS 0.1  --   BASOSABS 0.0  --     Chemistries   Recent Labs Lab 03/10/15 1807 03/11/15 0425  NA 138 139  K 4.3 3.6  CL 105 107  CO2 21* 21*  GLUCOSE 105* 109*  BUN 35* 30*  CREATININE 1.66* 1.51*  CALCIUM 9.1 8.7*  AST 48*  --   ALT 51  --   ALKPHOS 70  --   BILITOT 1.2  --    ------------------------------------------------------------------------------------------------------------------ estimated creatinine clearance is 35 mL/min (by C-G formula based on Cr of 1.51). ------------------------------------------------------------------------------------------------------------------ No results for input(s): HGBA1C in the last 72 hours. ------------------------------------------------------------------------------------------------------------------ No results for input(s): CHOL, HDL, LDLCALC, TRIG, CHOLHDL, LDLDIRECT in the last 72 hours. ------------------------------------------------------------------------------------------------------------------ No results for input(s): TSH, T4TOTAL, T3FREE, THYROIDAB in the last 72  hours.  Invalid input(s): FREET3 ------------------------------------------------------------------------------------------------------------------ No results for input(s): VITAMINB12, FOLATE, FERRITIN, TIBC, IRON, RETICCTPCT in the last 72 hours.  Coagulation profile No results for input(s): INR, PROTIME in the last 168 hours.  No results for input(s): DDIMER in the last 72 hours.  Cardiac Enzymes No results for input(s): CKMB, TROPONINI, MYOGLOBIN in the last 168 hours.  Invalid input(s): CK ------------------------------------------------------------------------------------------------------------------ Invalid input(s): POCBNP     Time Spent in minutes   30 minutes   ELGERGAWY, DAWOOD M.D on 03/11/2015 at 2:22 PM  Between 7am to 7pm - Pager - [phone]  After 7pm go to [email or web address] - password [facility]  [facility]   Office  [phone]

## 2015-03-12 LAB — URINE CULTURE: CULTURE: NO GROWTH

## 2015-03-12 MED ORDER — ASPIRIN-DIPYRIDAMOLE ER 25-200 MG PO CP12
1.0000 | ORAL_CAPSULE | Freq: Two times a day (BID) | ORAL | Status: DC
  Administered 2015-03-12 – 2015-03-13 (×3): 1 via ORAL
  Filled 2015-03-12 (×4): qty 1

## 2015-03-12 MED ORDER — POTASSIUM CHLORIDE CRYS ER 20 MEQ PO TBCR
40.0000 meq | EXTENDED_RELEASE_TABLET | Freq: Once | ORAL | Status: AC
  Administered 2015-03-12: 40 meq via ORAL
  Filled 2015-03-12: qty 2

## 2015-03-12 MED ORDER — LORAZEPAM 2 MG/ML IJ SOLN
0.5000 mg | Freq: Once | INTRAMUSCULAR | Status: AC
  Administered 2015-03-12: 0.5 mg via INTRAVENOUS
  Filled 2015-03-12: qty 1

## 2015-03-12 MED ORDER — FUROSEMIDE 10 MG/ML IJ SOLN
40.0000 mg | Freq: Once | INTRAMUSCULAR | Status: AC
  Administered 2015-03-12: 40 mg via INTRAVENOUS
  Filled 2015-03-12: qty 4

## 2015-03-12 MED ORDER — FUROSEMIDE 20 MG PO TABS
20.0000 mg | ORAL_TABLET | Freq: Every day | ORAL | Status: DC
  Filled 2015-03-12: qty 1

## 2015-03-12 NOTE — Progress Notes (Signed)
Patient Demographics  Jacob Rosales, is a 81 y.o. male, DOB - August 11, 1928, LFY:[id number]  Admit date - 03/10/2015   Admitting Physician Sid Falcon, MD  Outpatient Primary MD for the patient is REED, TIFFANY, DO  LOS - 2   Chief Complaint  Patient presents with  . Shortness of Breath       Admission HPI/Brief narrative: 81yo man with PMH of chronic systolic HF with ICD/PCM, osteoporosis, HTN, CAD, insomnia who presents with acute onset of SOB, cough and fatigue. Fever of 102, workup was significant for UTI, as well as his x-ray showing evidence of volume overload.  Subjective:   Jacob Rosales today has, No headache, No chest pain, No abdominal pain - No Nausea, No new weakness tingling or numbness, but he denies any shortness of breath.  Assessment & Plan    Active Problems:   Chronic systolic heart failure   Essential hypertension   SOB (shortness of breath)   Insomnia   Acute cystitis without hematuria   Pyrexia   UTI - Continue with IV Rocephin, follow on urine cultures   Acute on chronic renal failure -  baseline creatinine 1.1-1.5 , continue to hold lisinopril, will monitor closely as on Lasix   Dyspnea and cough  - X-ray showing evidence of volume overload , on Lasix  Chronic systolic heart failure with ICD  - EF is 25% on echo earlier this year  - Continue with Coreg, BiDil, on IV Lasix, received 40 mg yesterday, given another dose today , continue to hold lisinopril given his renal function. - Daily weights, weight dropped from 68 Kg on admission to 64Kg today, - 3375 ml over last 24 hours.   History of atrial fibrillation - Continue with Coreg, on Aggrenox for prophylaxis(confirmed by pharmacy)  Essential hypertension  -Continue with Coreg, lisinopril, resume BiDil    insomnia - Continue with melatonin  Code Status: DNR  Family Communication: Daughter over the  phone.  Disposition Plan: SNF in 24-28 hours   Procedures  none   Consults  none   Medications  Scheduled Meds: . antiseptic oral rinse  7 mL Mouth Rinse q12n4p  . carvedilol  6.25 mg Oral BID WC  . cefTRIAXone (ROCEPHIN) 1 g IVPB  1 g Intravenous Q24H  . chlorhexidine  15 mL Mouth Rinse BID  . dipyridamole-aspirin  1 capsule Oral BID  . [START ON 03/13/2015] furosemide  20 mg Oral Daily  . heparin  5,000 Units Subcutaneous 3 times per day  . memantine  28 mg Oral Daily  . senna-docusate  2 tablet Oral Daily  . simvastatin  20 mg Oral q1800  . sodium chloride  3 mL Intravenous Q12H   Continuous Infusions:  PRN Meds:.acetaminophen **OR** acetaminophen, alum & mag hydroxide-simeth, bisacodyl, HYDROcodone-acetaminophen, Melatonin, ondansetron **OR** ondansetron (ZOFRAN) IV  DVT Prophylaxis   Heparin   Lab Results  Component Value Date   PLT 144* 03/11/2015    Antibiotics    Anti-infectives    Start     Dose/Rate Route Frequency Ordered Stop   03/11/15 0600  cefTRIAXone (ROCEPHIN) 1 g in dextrose 5 % 50 mL IVPB     1 g 120 mL/hr over 30 Minutes Intravenous Every 24 hours 03/10/15 2158  03/10/15 2030  vancomycin (VANCOCIN) IVPB 1000 mg/200 mL premix     1,000 mg 200 mL/hr over 60 Minutes Intravenous  Once 03/10/15 2026 03/10/15 2143   03/10/15 2030  piperacillin-tazobactam (ZOSYN) IVPB 3.375 g     3.375 g 12.5 mL/hr over 240 Minutes Intravenous  Once 03/10/15 2026 03/11/15 0043          Objective:   Filed Vitals:   03/11/15 2140 03/12/15 0537 03/12/15 0611 03/12/15 1049  BP: 137/90  139/88 125/85  Pulse: 70  72 62  Temp: 98.4 F (36.9 C)  98.5 F (36.9 C)   TempSrc: Oral  Oral   Resp: 20  18   Height:      Weight:  64.229 kg (141 lb 9.6 oz)    SpO2: 98%  92%     Wt Readings from Last 3 Encounters:  03/12/15 64.229 kg (141 lb 9.6 oz)  03/08/15 68.493 kg (151 lb)  01/25/15 67.132 kg (148 lb)     Intake/Output Summary (Last 24 hours) at  03/12/15 1311 Last data filed at 03/12/15 0545  Gross per 24 hour  Intake     60 ml  Output   3010 ml  Net  -2950 ml     Physical Exam  Awake Alert, Oriented,  Normal affect [place].AT,PERRAL Supple Neck,No JVD,  Symmetrical Chest wall movement, Good air movement bilaterally,  No Gallops,Rubs or new Murmurs, No Parasternal Heave +ve B.Sounds, Abd Soft, No tenderness, No organomegaly appriciated,  No Cyanosis, Clubbing or edema,    Data Review   Micro Results Recent Results (from the past 240 hour(s))  Blood culture (routine x 2)     Status: None (Preliminary result)   Collection Time: 03/10/15  6:10 PM  Result Value Ref Range Status   Specimen Description BLOOD LEFT ARM  Final   Special Requests BOTTLES DRAWN AEROBIC AND ANAEROBIC 5ML EACH  Final   Culture NO GROWTH < 24 HOURS  Final   Report Status PENDING  Incomplete  MRSA PCR Screening     Status: None   Collection Time: 03/10/15  9:56 PM  Result Value Ref Range Status   MRSA by PCR NEGATIVE NEGATIVE Final    Comment:        The GeneXpert MRSA Assay (FDA approved for NASAL specimens only), is one component of a comprehensive MRSA colonization surveillance program. It is not intended to diagnose MRSA infection nor to guide or monitor treatment for MRSA infections.   Culture, Urine     Status: None   Collection Time: 03/11/15 12:23 PM  Result Value Ref Range Status   Specimen Description URINE, RANDOM  Final   Special Requests NONE  Final   Culture NO GROWTH 1 DAY  Final   Report Status 03/12/2015 FINAL  Final    Radiology Reports Dg Chest 2 View  03/11/2015   CLINICAL DATA:  Shortness of breath, history of CHF  EXAM: CHEST  2 VIEW  COMPARISON:  03/10/2015  FINDINGS: Cardiomegaly with pulmonary vascular congestion and mild interstitial edema.  Small right pleural effusion layering along the right major fissure. No pneumothorax.  Left chest ICD.  Postsurgical changes related to prior CABG.  IMPRESSION: Cardiomegaly  with mild interstitial edema and small right pleural effusion.   Electronically Signed   By: Julian Hy M.D.   On: 03/11/2015 09:07   Dg Chest 2 View  03/10/2015   CLINICAL DATA:  81 year old male with acute shortness of breath. Fever today.  EXAM: CHEST  2 VIEW  COMPARISON:  12/22/2014 and prior chest radiographs  FINDINGS: Cardiomegaly, CABG changes and left pacemaker AICD again noted.  Mild pulmonary vascular congestion is noted as well as possible mild interstitial edema.  A small right pleural effusion is now noted.  There is no evidence of pneumothorax or pulmonary mass.  Compression fractures of the thoracic and lumbar spine are again identified with vertebral augmentation changes of a thoracic vertebra.  IMPRESSION: Cardiomegaly with pulmonary vascular congestion and possible mild interstitial pulmonary edema.  New small right pleural effusion.   Electronically Signed   By: Margarette Canada M.D.   On: 03/10/2015 18:58     CBC  Recent Labs Lab 03/10/15 1807 03/11/15 0425  WBC 8.7 6.7  HGB 13.5 13.1  HCT 40.5 40.7  PLT 151 144*  MCV 95.5 94.9  MCH 31.8 30.5  MCHC 33.3 32.2  RDW 16.4* 16.5*  LYMPHSABS 0.6*  --   MONOABS 0.6  --   EOSABS 0.1  --   BASOSABS 0.0  --     Chemistries   Recent Labs Lab 03/10/15 1807 03/11/15 0425  NA 138 139  K 4.3 3.6  CL 105 107  CO2 21* 21*  GLUCOSE 105* 109*  BUN 35* 30*  CREATININE 1.66* 1.51*  CALCIUM 9.1 8.7*  AST 48*  --   ALT 51  --   ALKPHOS 70  --   BILITOT 1.2  --    ------------------------------------------------------------------------------------------------------------------ estimated creatinine clearance is 33.1 mL/min (by C-G formula based on Cr of 1.51). ------------------------------------------------------------------------------------------------------------------ No results for input(s): HGBA1C in the last 72  hours. ------------------------------------------------------------------------------------------------------------------ No results for input(s): CHOL, HDL, LDLCALC, TRIG, CHOLHDL, LDLDIRECT in the last 72 hours. ------------------------------------------------------------------------------------------------------------------ No results for input(s): TSH, T4TOTAL, T3FREE, THYROIDAB in the last 72 hours.  Invalid input(s): FREET3 ------------------------------------------------------------------------------------------------------------------ No results for input(s): VITAMINB12, FOLATE, FERRITIN, TIBC, IRON, RETICCTPCT in the last 72 hours.  Coagulation profile No results for input(s): INR, PROTIME in the last 168 hours.  No results for input(s): DDIMER in the last 72 hours.  Cardiac Enzymes No results for input(s): CKMB, TROPONINI, MYOGLOBIN in the last 168 hours.  Invalid input(s): CK ------------------------------------------------------------------------------------------------------------------ Invalid input(s): POCBNP     Time Spent in minutes   30 minutes   Jacob Rosales M.D on 03/12/2015 at 1:11 PM  Between 7am to 7pm - Pager - [phone]  After 7pm go to [email or web address] - password [email or web address]  [facility]   Office  [phone]

## 2015-03-12 NOTE — Progress Notes (Signed)
CSW was advised this pm of patient being from [facility] and needing SNF level of care at dc. CSW has completed FL2 for MD signature and have left message for SNF rep at [facility] to advise of this need- CSW covering tomorrow to follow up and assist further with Arma, MSW, [place]

## 2015-03-12 NOTE — Evaluation (Signed)
Physical Therapy Evaluation Patient Details Name: Jacob Rosales MRN: [id number] DOB: 1928-04-06 Today's Date: 03/12/2015   History of Present Illness  Pt is an 81 y.o. male adm from [facility] due to SOB. PMH of chronic systolic HF with ICD/PCM, osteoporosis, HTN, CAD, insomnia   Clinical Impression  Pt adm due to above. Pt presents with deficits indicated below (see PT problem list). Pt with multiple bouts of balance loss during ambulation. Required mod (A) to ambulate 25' with cane. Pt presents with decreased safety awareness and is a fall risk. Noted difficulty swallowing water and pills during evaluation, RN and SLP made aware. Pt to benefit from skilled acute PT to address balance and mobility deficits. Recommending SNF for 24/7 (A) and follow up PT upon D/C. Feel that Pt is not safe for independent living at this time.     Follow Up Recommendations SNF;Supervision/Assistance - 24 hour    Equipment Recommendations  Rolling walker with 5" wheels    Recommendations for Other Services Speech consult;OT consult     Precautions / Restrictions Precautions Precautions: Fall Precaution Comments: very unsteady gait Restrictions Weight Bearing Restrictions: No      Mobility  Bed Mobility               General bed mobility comments: pt up in chair  Transfers Overall transfer level: Needs assistance Equipment used: Straight cane Transfers: Sit to/from Stand Sit to Stand: Mod assist         General transfer comment: difficulty powering up; mod (A) to elevate and balance with sit <> stand transfers  Ambulation/Gait Ambulation/Gait assistance: Mod assist Ambulation Distance (Feet): 25 Feet Assistive device: Straight cane Gait Pattern/deviations: Scissoring;Decreased stride length;Staggering right;Wide base of support;Shuffle Gait velocity: decr Gait velocity interpretation: Below normal speed for age/gender General Gait Details: pt with multiple  bouts of balance loss; unaware of balance deficits during gait; very unsteady; scissor ambulation at times   Stairs            Wheelchair Mobility    Modified Rankin (Stroke Patients Only)       Balance Overall balance assessment: Needs assistance;History of Falls         Standing balance support: During functional activity;Single extremity supported Standing balance-Leahy Scale: Poor Standing balance comment: UE support and mod (A)                              Pertinent Vitals/Pain Pain Assessment: No/denies pain    Home Living Family/patient expects to be discharged to:: Other (Comment) (From independent living)                      Prior Function Level of Independence: Independent with assistive device(s)         Comments: Independent with dressing bathing and medications per pt; ambulates with cane     Hand Dominance   Dominant Hand: Right    Extremity/Trunk Assessment   Upper Extremity Assessment: Defer to OT evaluation           Lower Extremity Assessment: Generalized weakness      Cervical / Trunk Assessment: Kyphotic  Communication   Communication: Expressive difficulties  Cognition Arousal/Alertness: Awake/alert Behavior During Therapy: Flat affect Overall Cognitive Status: Impaired/Different from baseline Area of Impairment: Orientation;Safety/judgement;Problem solving;Following commands;Memory;Attention Orientation Level: Disoriented to;Place Current Attention Level: Sustained Memory: Decreased short-term memory Following Commands: Follows one step commands with increased time Safety/Judgement: Decreased  awareness of safety;Decreased awareness of deficits   Problem Solving: Slow processing;Difficulty sequencing;Requires verbal cues;Requires tactile cues General Comments: pt very flat affect; expressive aphasia noted;  unaware of balance deficits during session     General Comments General comments (skin  integrity, edema, etc.): noted difficulty swalling pills    Exercises        Assessment/Plan    PT Assessment Patient needs continued PT services  PT Diagnosis Difficulty walking;Generalized weakness   PT Problem List Decreased strength;Decreased activity tolerance;Decreased balance;Decreased mobility;Decreased cognition;Decreased knowledge of use of DME;Decreased safety awareness  PT Treatment Interventions DME instruction;Gait training;Functional mobility training;Therapeutic activities;Therapeutic exercise;Balance training;Neuromuscular re-education;Patient/family education   PT Goals (Current goals can be found in the Care Plan section) Acute Rehab PT Goals Patient Stated Goal: to go back to [facility] PT Goal Formulation: With patient Time For Goal Achievement: 03/26/15 Potential to Achieve Goals: Good    Frequency Min 3X/week   Barriers to discharge Decreased caregiver support was from independent living    Co-evaluation               End of Session Equipment Utilized During Treatment: Gait belt Activity Tolerance: Patient tolerated treatment well Patient left: in chair;with call bell/phone within reach;with chair alarm set Nurse Communication: Mobility status;Precautions         Time: 2637-8588 PT Time Calculation (min) (ACUTE ONLY): 16 min   Charges:   PT Evaluation $Initial PT Evaluation Tier I: 1 Procedure     PT G Codes:        Alvera Tourigny, [place] N PT  03/12/2015, 11:35 AM

## 2015-03-12 NOTE — Progress Notes (Signed)
Spoke with social work about PT recommendation and pt. Being from [facility]. Living. Pt. Likely to be D/C'd tomorrow. Spoke with pt. Wife about pt. Likely being D/C'd tomorrow to skilled nursing side of facility, due to increased weakness. Pt. Continuing to have some coughing noted after taking meds and eating. Expressed concerned to MD. MD states that pt. Has had issue in the past and has been followed by speech previously. Not necessary for speech consult this admission. Will continue to monitor.

## 2015-03-12 NOTE — Progress Notes (Signed)
Pt is very restless and is getting out of the bed frequently. Pt refused to take melatonin for insomnia. On-call provider Fredirick Maudlin, NP notified via text -page.  0117, order received for x1 dose of 0.5 mg  IV ativan. Will monitor.

## 2015-03-13 DIAGNOSIS — I5023 Acute on chronic systolic (congestive) heart failure: Secondary | ICD-10-CM

## 2015-03-13 LAB — BASIC METABOLIC PANEL
Anion gap: 10 (ref 5–15)
BUN: 23 mg/dL — AB (ref 6–20)
CO2: 24 mmol/L (ref 22–32)
Calcium: 8.7 mg/dL — ABNORMAL LOW (ref 8.9–10.3)
Chloride: 108 mmol/L (ref 101–111)
Creatinine, Ser: 1.42 mg/dL — ABNORMAL HIGH (ref 0.61–1.24)
GFR, EST AFRICAN AMERICAN: 51 mL/min — AB (ref >60)
GFR, EST NON AFRICAN AMERICAN: 44 mL/min — AB (ref >60)
Glucose, Bld: 98 mg/dL (ref 65–99)
Potassium: 3.4 mmol/L — ABNORMAL LOW (ref 3.5–5.1)
Sodium: 142 mmol/L (ref 135–145)

## 2015-03-13 MED ORDER — FUROSEMIDE 10 MG/ML IJ SOLN
40.0000 mg | Freq: Once | INTRAMUSCULAR | Status: AC
  Administered 2015-03-13: 40 mg via INTRAVENOUS

## 2015-03-13 MED ORDER — ACETAMINOPHEN 325 MG PO TABS: 650.0000 mg | ORAL_TABLET | Freq: Four times a day (QID) | ORAL | Status: DC | PRN

## 2015-03-13 MED ORDER — POTASSIUM CHLORIDE CRYS ER 20 MEQ PO TBCR
30.0000 meq | EXTENDED_RELEASE_TABLET | ORAL | Status: AC
  Administered 2015-03-13: 30 meq via ORAL
  Filled 2015-03-13 (×2): qty 1

## 2015-03-13 MED ORDER — BISACODYL 5 MG PO TBEC: 5.0000 mg | DELAYED_RELEASE_TABLET | Freq: Every day | ORAL | Status: DC | PRN

## 2015-03-13 MED ORDER — FUROSEMIDE 10 MG/ML IJ SOLN
INTRAMUSCULAR | Status: AC
  Administered 2015-03-13: 40 mg via INTRAVENOUS
  Filled 2015-03-13: qty 4

## 2015-03-13 MED ORDER — CIPROFLOXACIN HCL 250 MG PO TABS: 250.0000 mg | ORAL_TABLET | Freq: Two times a day (BID) | ORAL | Status: AC

## 2015-03-13 MED ORDER — ASPIRIN-DIPYRIDAMOLE ER 25-200 MG PO CP12: 1.0000 | ORAL_CAPSULE | Freq: Two times a day (BID) | ORAL | Status: DC

## 2015-03-13 MED ORDER — ONDANSETRON HCL 4 MG PO TABS: 4.0000 mg | ORAL_TABLET | Freq: Four times a day (QID) | ORAL | Status: DC | PRN

## 2015-03-13 MED ORDER — FUROSEMIDE 20 MG PO TABS: 40.0000 mg | ORAL_TABLET | Freq: Every day | ORAL | Status: DC

## 2015-03-13 MED ORDER — FUROSEMIDE 20 MG PO TABS: 20.0000 mg | ORAL_TABLET | Freq: Every day | ORAL | Status: DC

## 2015-03-13 NOTE — Discharge Instructions (Signed)
Follow with Primary MD REED, TIFFANY, DO in 7 days   Get CBC, CMP, 2 view Chest X ray checked  by Primary MD next visit.    Activity: As tolerated with Full fall precautions use walker/cane & assistance as needed   Disposition SNF   Diet: Heart Healthy , with feeding assistance and aspiration precautions.  For Heart failure patients - Check your Weight same time everyday, if you gain over 2 pounds, or you develop in leg swelling, experience more shortness of breath or chest pain, call your Primary MD immediately. Follow Cardiac Low Salt Diet and 1.5 lit/day fluid restriction.   On your next visit with your primary care physician please Get Medicines reviewed and adjusted.   Please request your Prim.MD to go over all Hospital Tests and Procedure/Radiological results at the follow up, please get all Hospital records sent to your Prim MD by signing hospital release before you go home.   If you experience worsening of your admission symptoms, develop shortness of breath, life threatening emergency, suicidal or homicidal thoughts you must seek medical attention immediately by calling 911 or calling your MD immediately  if symptoms less severe.  You Must read complete instructions/literature along with all the possible adverse reactions/side effects for all the Medicines you take and that have been prescribed to you. Take any new Medicines after you have completely understood and accpet all the possible adverse reactions/side effects.   Do not drive, operating heavy machinery, perform activities at heights, swimming or participation in water activities or provide baby sitting services if your were admitted for syncope or siezures until you have seen by Primary MD or a Neurologist and advised to do so again.  Do not drive when taking Pain medications.    Do not take more than prescribed Pain, Sleep and Anxiety Medications  Special Instructions: If you have smoked or chewed Tobacco  in the  last 2 yrs please stop smoking, stop any regular Alcohol  and or any Recreational drug use.  Wear Seat belts while driving.   Please note  You were cared for by a hospitalist during your hospital stay. If you have any questions about your discharge medications or the care you received while you were in the hospital after you are discharged, you can call the unit and asked to speak with the hospitalist on call if the hospitalist that took care of you is not available. Once you are discharged, your primary care physician will handle any further medical issues. Please note that NO REFILLS for any discharge medications will be authorized once you are discharged, as it is imperative that you return to your primary care physician (or establish a relationship with a primary care physician if you do not have one) for your aftercare needs so that they can reassess your need for medications and monitor your lab values.

## 2015-03-13 NOTE — Progress Notes (Signed)
Report given to [place], Therapist, sports.  PIV removed.  Awaiting transport.

## 2015-03-13 NOTE — Clinical Social Work Note (Signed)
Per MD patient ready to DC back to [facility]. RN, patient/family, and facility notified of patient's DC. RN given number for report. DC packet on patient's chart. Patient is being transported by facility. CSW signing off at this time.   Liz Beach MSW, [place], [place], [phone]

## 2015-03-13 NOTE — Care Management (Signed)
Important Message  Patient Details  Name: Jacob Rosales MRN: [id number] Date of Birth: 30-Mar-1928   Medicare Important Message Given:  Yes-second notification given    Delorse Lek 03/13/2015, 3:42 PM

## 2015-03-13 NOTE — Clinical Social Work Note (Signed)
Clinical Social Work Assessment  Patient Details  Name: Jacob Rosales MRN: [id number] Date of Birth: 04-Jan-1928  Date of referral:  03/13/15               Reason for consult:  Facility Placement, Discharge Planning                Permission sought to share information with:  Facility Sport and exercise psychologist, Family Supports Permission granted to share information::     Name::     Jacob Rosales and Surveyor, minerals::  [facility]  Relationship::     Contact Information:     Housing/Transportation Living arrangements for the past 2 months:  Charity fundraiser of Information:  Patient Patient Interpreter Needed:  None Criminal Activity/Legal Involvement Pertinent to Current Situation/Hospitalization:  No - Comment as needed Significant Relationships:  Adult Children, Spouse Lives with:  Facility Resident, Spouse Do you feel safe going back to the place where you live?  Yes Need for family participation in patient care:  Yes (Comment)  Care giving concerns:  Patient is agreeable to going to SNF section of [facility] due to increased need for assistance.    Social Worker assessment / plan:  CSW met with patient at bedside. CSW has left messages for both the wife Jacob Rosales, and daughter Jacob Rosales as well. Patient confirms that he was admitted from [facility] and states that the plan is for him to go to the SNF section at discharge. CSW explained role and process of getting the patient to the facility. The patient would like his wife to transport him. Will try to confirm this with the wife.   Employment status:  Retired Forensic scientist:  Medicare PT Recommendations:  [place] / Referral to community resources:  [place]  Patient/Family's Response to care:  Patient doesn't appear to have any complaints and seems happy with the care he is receiving.   Patient/Family's Understanding of and Emotional Response to Diagnosis, Current Treatment,  and Prognosis:  Patient appears to have fair insight.   Emotional Assessment Appearance:  Appears stated age Attitude/Demeanor/Rapport:  Other (Somewhat confused but appropriate with CSW) Affect (typically observed):  Accepting, Appropriate, Calm Orientation:  Oriented to Self, Oriented to Place, Oriented to  Time Alcohol / Substance use:  Tobacco Use, Alcohol Use (Hx of tobacco use, current drinker.) Psych involvement (Current and /or in the community):  No (Comment)  Discharge Needs  Concerns to be addressed:  Discharge Planning Concerns Readmission within the last 30 days:  No Current discharge risk:  Physical Impairment, Cognitively Impaired Barriers to Discharge:  Requiring sitter/restraints (Sitter was discharged as of this morning. 24 hours without sitter required prior to DC.)   Liz Beach MSW, [place], [place], [phone]

## 2015-03-13 NOTE — Discharge Summary (Signed)
Jacob Rosales, is a 81 y.o. male  DOB [birth year]  MRN [id number].  Admission date:  03/10/2015  Admitting Physician  Sid Falcon, MD  Discharge Date:  03/13/2015   Primary MD  Hollace Kinnier, DO  Recommendations for primary care physician for things to follow:  - Check labs including CBC, BMP your next visit.   Admission Diagnosis  Renal insufficiency [N28.9] Acute cystitis without hematuria [N30.00] Sepsis, due to unspecified organism [A41.9] Fever, unspecified fever cause [R50.9]   Discharge Diagnosis  Renal insufficiency [N28.9] Acute cystitis without hematuria [N30.00] Sepsis, due to unspecified organism [A41.9] Fever, unspecified fever cause [R50.9]    Active Problems:   Chronic systolic heart failure   Essential hypertension   SOB (shortness of breath)   Insomnia   Acute cystitis without hematuria   Pyrexia      Past Medical History  Diagnosis Date  . Complete AV block   . Chronic systolic heart failure   . Cardiomyopathy, ischemic   . Automatic implantable cardiac defibrillator in situ   . CAD (coronary artery disease) of artery bypass graft   . Prostate cancer     S/P radiation rx  . Osteoporosis     A/P Vertebral compression fx's  . Hypertension   . Dyslipidemia   . TIA (transient ischemic attack)     H/o  . Lumbago 10/05/2012  . Cervicalgia 04/06/2012  . 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 Unspecified vitamin D deficiency 10/07/2011  . Unspecified pruritic disorder 10/07/2011  . Rash and other nonspecific skin eruption 10/07/2011  . Basal cell carcinoma of skin of lower limb, including hip 04/08/2011  . Squamous cell carcinoma of skin of lower limb, including hip 04/08/2011  . Unspecified sinusitis (chronic) 10/08/2010  . Impotence of organic origin 10/08/2010  . Seborrhea 04/09/2010  . Insomnia, unspecified 04/09/2010  . Altered mental status 01/12/2010  . Full  incontinence of feces 10/09/2009  . Atrial fibrillation 05/15/2006  . Unspecified urinary incontinence 05/15/2006  . Sebaceous cyst 10/09/2005  . Pathologic fracture of vertebrae 05/16/1991  . Fracture of C2 vertebra, closed     s/p cervical fusion 05/2013  . Osteoporosis with fracture 04/05/2013    History of vertebal fracture.   Marland Kitchen AICD (automatic cardioverter/defibrillator) present     Past Surgical History  Procedure Laterality Date  . Coronary artery bypass graft    . Hernia repair    . Kyphosis surgery    . Biv-icd  11/21/2010    [employer] ICD Model#CD3231-40 [phone]  . Cardiac defibrillator placement    . Cyst excision  10/2012    back Dr. Wilhemina Bonito  . Cyst removal neck  12/2012    basel cell (right) Dr. Sarajane Jews  . Colonoscopy  2006    polyps benign  . Dexa  April 2010  . Posterior cervical fusion/foraminotomy N/A 05/21/2013    Procedure: Cervical One, Cervical Two, Cervical Three Posterior cervical fusion with lateral mass fixation;  Surgeon: Charlie Pitter, MD;  Location: [facility];  Service: Neurosurgery;  Laterality: N/A;  POSTERIOR CERVICAL FUSION/FORAMINOTOMY LEVEL 2       History of present illness and  Hospital Course:     Kindly see H&P for history of present illness and admission details, please review complete Labs, Consult reports and Test reports for all details in brief  HPI  from the history and physical done on the day of admission 7/8 by Dr Jeanella Flattery Jacob Rosales is an 81yo man with PMH of chronic systolic HF with ICD/PCM, osteoporosis, HTN, CAD, insomnia who presents with acute onset of SOB, cough and fatigue. His wife reports that he just "didn't feel right." Further symptoms included shaking and feeling cold. He lives at [facility] and they had a nurse come check him. At that time, he did not have a fever. He was placed on O2 and he felt better. He was brought to the ED for evaluation. In the ED, he did have a temperature up to 102 F. His wife was  concerned, as he was admitted last April with a PNA and difficulty swallowing afterwards (discharge summary lists only acute on chronic systolic failure, however). She was concerned he might have aspirated. Jacob Rosales reports no furtehr symptoms and specifically denies choking, nausea, vomiting, chest pain, dysuria, abdominal pain, CVA tenderness, swelling in his legs, rash or wound, diarrhea. In the ED, he was found to be febrile, tachypneic on one reading. He had a normal WBC, however, UA was nitrite positive. Given his symptoms of SOB and cough, he was placed on broad spectrum Abx with Vancomycin and Zosyn. Cultures were sent. Lactic acid was 1.92. He was given a 1L bolus of fluid, BP has been stable.     Hospital Course  [phone] man with PMH of chronic systolic HF with ICD/PCM, osteoporosis, HTN, CAD, insomnia who presents with acute onset of SOB, cough and fatigue. Fever of 102, workup was significant for UTI, as well as his x-ray showing evidence of volume overload.  UTI - with IV Rocephin 3 days, to finish another 4 days of oral Cipro as an outpatient.  urine cultures with no growth  Acute on chronic renal failure - baseline creatinine 1.1-1.5 , continue to hold lisinopril on discharge, to baseline, most recent creatinine is 1.4.   Dyspnea and cough  - X-ray showing evidence of volume overload , on Lasix  Acute on chronic Chronic systolic heart failure with ICD  - EF is 25% on echo earlier this year  - Continue with Coreg, BiDil,  continue to hold lisinopril given his renal function. - Daily weights, weight dropped from 68 Kg on admission to 65Kg today, - 4385 ml during hospital stay. - 6 increased from 20 mg oral to 40 mg oral daily on discharge  History of atrial fibrillation - Continue with Coreg, on Aggrenox for prophylaxis(confirmed by pharmacy , to the intolerance to warfarin)  Essential hypertension  -Continue with Coreg,, BiDil   insomnia - Continue with  melatonin     Discharge Condition:  stable   Follow UP  Follow-up Information    Follow up with REED, TIFFANY, DO. Schedule an appointment as soon as possible for a visit in 1 week.   Specialty:  Geriatric Medicine   Why:  Posthospitalization follow-up   Contact information:   [place] [phone]         Discharge Instructions  and  Discharge Medications     Discharge Instructions  Discharge instructions    Complete by:  As directed   Follow with Primary MD REED, TIFFANY, DO in 7 days   Get CBC, CMP, 2 view Chest X ray checked  by Primary MD next visit.    Activity: As tolerated with Full fall precautions use walker/cane & assistance as needed   Disposition SNF   Diet: Heart Healthy , with feeding assistance and aspiration precautions.  For Heart failure patients - Check your Weight same time everyday, if you gain over 2 pounds, or you develop in leg swelling, experience more shortness of breath or chest pain, call your Primary MD immediately. Follow Cardiac Low Salt Diet and 1.5 lit/day fluid restriction.   On your next visit with your primary care physician please Get Medicines reviewed and adjusted.   Please request your Prim.MD to go over all Hospital Tests and Procedure/Radiological results at the follow up, please get all Hospital records sent to your Prim MD by signing hospital release before you go home.   If you experience worsening of your admission symptoms, develop shortness of breath, life threatening emergency, suicidal or homicidal thoughts you must seek medical attention immediately by calling 911 or calling your MD immediately  if symptoms less severe.  You Must read complete instructions/literature along with all the possible adverse reactions/side effects for all the Medicines you take and that have been prescribed to you. Take any new Medicines after you have completely understood and accpet all the possible  adverse reactions/side effects.   Do not drive, operating heavy machinery, perform activities at heights, swimming or participation in water activities or provide baby sitting services if your were admitted for syncope or siezures until you have seen by Primary MD or a Neurologist and advised to do so again.  Do not drive when taking Pain medications.    Do not take more than prescribed Pain, Sleep and Anxiety Medications  Special Instructions: If you have smoked or chewed Tobacco  in the last 2 yrs please stop smoking, stop any regular Alcohol  and or any Recreational drug use.  Wear Seat belts while driving.   Please note  You were cared for by a hospitalist during your hospital stay. If you have any questions about your discharge medications or the care you received while you were in the hospital after you are discharged, you can call the unit and asked to speak with the hospitalist on call if the hospitalist that took care of you is not available. Once you are discharged, your primary care physician will handle any further medical issues. Please note that NO REFILLS for any discharge medications will be authorized once you are discharged, as it is imperative that you return to your primary care physician (or establish a relationship with a primary care physician if you do not have one) for your aftercare needs so that they can reassess your need for medications and monitor your lab values.     Increase activity slowly    Complete by:  As directed             Medication List    STOP taking these medications        aspirin 81 MG tablet     lisinopril 20 MG tablet  Commonly known as:  PRINIVIL,ZESTRIL      TAKE these medications        acetaminophen 325 MG tablet  Commonly known as:  TYLENOL  Take 2 tablets (650 mg total) by mouth every 6 (six)  hours as needed for mild pain (or Fever >/= 101).     bisacodyl 5 MG EC tablet  Commonly known as:  DULCOLAX  Take 1 tablet (5 mg  total) by mouth daily as needed for moderate constipation.     CALCIUM 600 + D PO  Take 1 tablet by mouth daily.     carvedilol 6.25 MG tablet  Commonly known as:  COREG  TAKE 1 TABLET TWICE A DAY WITH A MEAL TO STRENGTHEN THE HEART     cholecalciferol 1000 UNITS tablet  Commonly known as:  VITAMIN D  Take 1,000 Units by mouth daily.     ciprofloxacin 250 MG tablet  Commonly known as:  CIPRO  Take 1 tablet (250 mg total) by mouth 2 (two) times daily. Total of 4 days,stop on 03/18/15  Start taking on:  03/14/2015     dipyridamole-aspirin 200-25 MG per 12 hr capsule  Commonly known as:  AGGRENOX  Take 1 capsule by mouth 2 (two) times daily.     furosemide 20 MG tablet  Commonly known as:  LASIX  Take 2 tablets (40 mg total) by mouth daily.  Start taking on:  03/14/2015     isosorbide-hydrALAZINE 20-37.5 MG per tablet  Commonly known as:  BIDIL  Take 1 tablet by mouth 2 (two) times daily.     Melatonin 5 MG Caps  Take 1 capsule (5 mg total) by mouth at bedtime as needed.     memantine 28 MG Cp24 24 hr capsule  Commonly known as:  NAMENDA XR  Take one tablet daily for memory     ondansetron 4 MG tablet  Commonly known as:  ZOFRAN  Take 1 tablet (4 mg total) by mouth every 6 (six) hours as needed for nausea.     senna-docusate 8.6-50 MG per tablet  Commonly known as:  Senokot-S  Take 2 tablets by mouth daily.     simvastatin 20 MG tablet  Commonly known as:  ZOCOR  TAKE 1 TABLET BY MOUTH AT BEDTIME FOR CHOLESTEROL     triamcinolone 0.025 % cream  Commonly known as:  KENALOG  Apply 1 application topically 2 (two) times daily as needed (rash).          Diet and Activity recommendation: See Discharge Instructions above   Consults obtained -  none  Major procedures and Radiology Reports - PLEASE review detailed and final reports for all details, in brief -      Dg Chest 2 View  03/11/2015   CLINICAL DATA:  Shortness of breath, history of CHF  EXAM: CHEST  2  VIEW  COMPARISON:  03/10/2015  FINDINGS: Cardiomegaly with pulmonary vascular congestion and mild interstitial edema.  Small right pleural effusion layering along the right major fissure. No pneumothorax.  Left chest ICD.  Postsurgical changes related to prior CABG.  IMPRESSION: Cardiomegaly with mild interstitial edema and small right pleural effusion.   Electronically Signed   By: Julian Hy M.D.   On: 03/11/2015 09:07   Dg Chest 2 View  03/10/2015   CLINICAL DATA:  81 year old male with acute shortness of breath. Fever today.  EXAM: CHEST  2 VIEW  COMPARISON:  12/22/2014 and prior chest radiographs  FINDINGS: Cardiomegaly, CABG changes and left pacemaker AICD again noted.  Mild pulmonary vascular congestion is noted as well as possible mild interstitial edema.  A small right pleural effusion is now noted.  There is no evidence of pneumothorax or pulmonary mass.  Compression fractures of  the thoracic and lumbar spine are again identified with vertebral augmentation changes of a thoracic vertebra.  IMPRESSION: Cardiomegaly with pulmonary vascular congestion and possible mild interstitial pulmonary edema.  New small right pleural effusion.   Electronically Signed   By: Margarette Canada M.D.   On: 03/10/2015 18:58    Micro Results     Recent Results (from the past 240 hour(s))  Blood culture (routine x 2)     Status: None (Preliminary result)   Collection Time: 03/10/15  6:10 PM  Result Value Ref Range Status   Specimen Description BLOOD LEFT ARM  Final   Special Requests BOTTLES DRAWN AEROBIC AND ANAEROBIC 5ML EACH  Final   Culture NO GROWTH 2 DAYS  Final   Report Status PENDING  Incomplete  Blood culture (routine x 2)     Status: None (Preliminary result)   Collection Time: 03/10/15  6:14 PM  Result Value Ref Range Status   Specimen Description BLOOD RIGHT HAND  Final   Special Requests BOTTLES DRAWN AEROBIC ONLY 5CC  Final   Culture NO GROWTH 1 DAY  Final   Report Status PENDING   Incomplete  MRSA PCR Screening     Status: None   Collection Time: 03/10/15  9:56 PM  Result Value Ref Range Status   MRSA by PCR NEGATIVE NEGATIVE Final    Comment:        The GeneXpert MRSA Assay (FDA approved for NASAL specimens only), is one component of a comprehensive MRSA colonization surveillance program. It is not intended to diagnose MRSA infection nor to guide or monitor treatment for MRSA infections.   Culture, Urine     Status: None   Collection Time: 03/11/15 12:23 PM  Result Value Ref Range Status   Specimen Description URINE, RANDOM  Final   Special Requests NONE  Final   Culture NO GROWTH 1 DAY  Final   Report Status 03/12/2015 FINAL  Final       Today   Subjective:   Jacob Rosales today has no headache,no chest abdominal pain,no new weakness tingling or numbness, feels much better today.   Objective:   Blood pressure 138/96, pulse 81, temperature 97.4 F (36.3 C), temperature source Oral, resp. rate 15, height 6\' 1"  (1.854 m), weight 65.2 kg (143 lb 11.8 oz), SpO2 96 %.   Intake/Output Summary (Last 24 hours) at 03/13/15 1336 Last data filed at 03/13/15 0900  Gross per 24 hour  Intake    580 ml  Output    950 ml  Net   -370 ml    Exam  Awake Alert, Oriented, Normal affect Jacob Rosales.AT,PERRAL Supple Neck,No JVD,  Symmetrical Chest wall movement, Good air movement bilaterally,  No Gallops,Rubs or new Murmurs, No Parasternal Heave +ve B.Sounds, Abd Soft, No tenderness, No organomegaly appriciated,  No Cyanosis, Clubbing or edema,  Data Review   CBC w Diff: Lab Results  Component Value Date   WBC 6.7 03/11/2015   HGB 13.1 03/11/2015   HCT 40.7 03/11/2015   HCT 42.0 12/18/2014   PLT 144* 03/11/2015   LYMPHOPCT 7* 03/10/2015   MONOPCT 6 03/10/2015   EOSPCT 1 03/10/2015   BASOPCT 0 03/10/2015    CMP: Lab Results  Component Value Date   NA 142 03/13/2015   NA 138 12/29/2014   K 3.4* 03/13/2015   CL 108 03/13/2015   CO2 24 03/13/2015     BUN 23* 03/13/2015   BUN 25* 12/29/2014   CREATININE 1.42* 03/13/2015  CREATININE 1.1 12/29/2014   GLU 81 12/29/2014   PROT 6.9 03/10/2015   ALBUMIN 3.8 03/10/2015   BILITOT 1.2 03/10/2015   ALKPHOS 70 03/10/2015   AST 48* 03/10/2015   ALT 51 03/10/2015  .   Total Time in preparing paper work, data evaluation and todays exam - 35 minutes  Deivi Huckins M.D on 03/13/2015 at [place] PM  [facility]   Office  [phone]

## 2015-03-13 NOTE — Care Management Note (Signed)
Case Management Note  Patient Details  Name: Jacob Rosales MRN: [id number] Date of Birth: 1927/11/27  Subjective/Objective:   Patient is for dc to snf today at [facility]	[place].  CSW following.               Action/Plan:   Expected Discharge Date:                  Expected Discharge Plan:  Skilled Nursing Facility  In-House Referral:  Clinical Social Work  Discharge planning Services  CM Consult  Post Acute Care Choice:    Choice offered to:     DME Arranged:    DME Agency:     HH Arranged:    [place] Agency:     Status of Service:     Medicare Important Message Given:    Date Medicare IM Given:    Medicare IM give by:    Date Additional Medicare IM Given:    Additional Medicare Important Message give by:     If discussed at [place] of Stay Meetings, dates discussed:    Additional Comments:  Zenon Mayo, RN 03/13/2015, 1:57 PM

## 2015-03-13 NOTE — Evaluation (Signed)
Occupational Therapy Evaluation Patient Details Name: Jacob Rosales MRN: [id number] DOB: 07-16-1928 Today's Date: 03/13/2015    History of Present Illness Pt is an 81 y.o. male adm from [facility] due to SOB. PMH of chronic systolic HF with ICD/PCM, osteoporosis, HTN, CAD, insomnia    Clinical Impression   Pt pleasant but disoriented to situation and exhibits decreased memory, balance, and safety awareness.  Currently min assist for toilet transfers as well as standing during LB selfcare tasks.  Feel he will benefit from SNF for post acute OT at this time as pt was at independent living prior to admission.      Follow Up Recommendations  SNF;Supervision/Assistance - 24 hour    Equipment Recommendations  None recommended by OT (TBD next venue of care)       Precautions / Restrictions Precautions Precautions: Fall Restrictions Weight Bearing Restrictions: No      Mobility Bed Mobility Overal bed mobility: Needs Assistance                Transfers Overall transfer level: Needs assistance Equipment used: 1 person hand held assist Transfers: Sit to/from Stand;Stand Pivot Transfers Sit to Stand: Min assist Stand pivot transfers: Min assist            Balance Overall balance assessment: Needs assistance   Sitting balance-Leahy Scale: Fair     Standing balance support: No upper extremity supported Standing balance-Leahy Scale: Poor                              ADL Overall ADL's : Needs assistance/impaired Eating/Feeding: Independent;Sitting   Grooming: Wash/dry hands;Wash/dry face;Standing;Minimal assistance   Upper Body Bathing: Set up;Sitting   Lower Body Bathing: Minimal assistance;Sit to/from stand   Upper Body Dressing : Supervision/safety;Sitting   Lower Body Dressing: Minimal assistance;Sit to/from stand   Toilet Transfer: Minimal Teacher, English as a foreign language;Ambulation;Grab bars   Toileting- Clothing  Manipulation and Hygiene: Minimal assistance;Sit to/from stand       Functional mobility during ADLs: Minimal assistance General ADL Comments: Pt with decreased orientation to hospital and reason for being here.  Wants to return to rehab at [facility] as he was in independent living prior to admission. Decreased dynamic balance and safety for selfcare tasks in standing at this time.      Vision Vision Assessment?: No apparent visual deficits   Perception Perception Perception Tested?: No   Praxis Praxis Praxis tested?: Not tested    Pertinent Vitals/Pain Pain Assessment: No/denies pain     Hand Dominance Right   Extremity/Trunk Assessment Upper Extremity Assessment Upper Extremity Assessment: Generalized weakness (3+/5 throughout, all joints AROM WFLs)       Cervical / Trunk Assessment Cervical / Trunk Assessment: Kyphotic   Communication Communication Communication: Expressive difficulties   Cognition Arousal/Alertness: Awake/alert Behavior During Therapy: WFL for tasks assessed/performed Overall Cognitive Status: Impaired/Different from baseline Area of Impairment: Orientation;Memory;Problem solving;Safety/judgement Orientation Level: Situation;Place Current Attention Level: Sustained Memory: Decreased short-term memory Following Commands: Follows one step commands consistently Safety/Judgement: Decreased awareness of safety;Decreased awareness of deficits   Problem Solving: Requires verbal cues General Comments: Pt able to recall 2/3 words after 5 min delay, 1/3 after 10 minute delay.                Home Living Family/patient expects to be discharged to:: Other (Comment) (From independent living)  Prior Functioning/Environment Level of Independence: Independent with assistive device(s)        Comments: Independent with dressing bathing and medications per pt; ambulates with cane    OT Diagnosis:  Generalized weakness;Cognitive deficits   OT Problem List: Decreased strength;Decreased activity tolerance;Impaired balance (sitting and/or standing);Decreased knowledge of use of DME or AE      OT Goals(Current goals can be found in the care plan section) Acute Rehab OT Goals Patient Stated Goal: To go back to [facility] OT Goal Formulation: With patient Time For Goal Achievement: 03/27/15 Potential to Achieve Goals: Good                End of Session Nurse Communication: Mobility status  Activity Tolerance: Patient tolerated treatment well Patient left: in chair;with call bell/phone within reach;with chair alarm set   Time: 1610-9604 OT Time Calculation (min): 19 min Charges:  OT General Charges $OT Visit: 1 Procedure OT Treatments $Self Care/Home Management : 23-37 mins  Carmelita Amparo OTR/L 03/13/2015, 11:25 AM

## 2015-03-14 ENCOUNTER — Non-Acute Institutional Stay (SKILLED_NURSING_FACILITY): Payer: Medicare Other | Admitting: Internal Medicine

## 2015-03-14 ENCOUNTER — Encounter: Payer: Self-pay | Admitting: Internal Medicine

## 2015-03-14 DIAGNOSIS — N390 Urinary tract infection, site not specified: Secondary | ICD-10-CM

## 2015-03-14 DIAGNOSIS — I482 Chronic atrial fibrillation, unspecified: Secondary | ICD-10-CM

## 2015-03-14 DIAGNOSIS — I10 Essential (primary) hypertension: Secondary | ICD-10-CM

## 2015-03-14 DIAGNOSIS — G214 Vascular parkinsonism: Secondary | ICD-10-CM | POA: Diagnosis not present

## 2015-03-14 DIAGNOSIS — G4700 Insomnia, unspecified: Secondary | ICD-10-CM | POA: Diagnosis not present

## 2015-03-14 DIAGNOSIS — I5023 Acute on chronic systolic (congestive) heart failure: Secondary | ICD-10-CM

## 2015-03-14 DIAGNOSIS — G309 Alzheimer's disease, unspecified: Secondary | ICD-10-CM | POA: Diagnosis not present

## 2015-03-14 DIAGNOSIS — W449XXA Unspecified foreign body entering into or through a natural orifice, initial encounter: Secondary | ICD-10-CM

## 2015-03-14 DIAGNOSIS — T17800A Unspecified foreign body in other parts of respiratory tract causing asphyxiation, initial encounter: Secondary | ICD-10-CM

## 2015-03-14 DIAGNOSIS — A419 Sepsis, unspecified organism: Secondary | ICD-10-CM

## 2015-03-14 DIAGNOSIS — F0280 Dementia in other diseases classified elsewhere without behavioral disturbance: Secondary | ICD-10-CM | POA: Diagnosis not present

## 2015-03-14 DIAGNOSIS — F0150 Vascular dementia without behavioral disturbance: Secondary | ICD-10-CM

## 2015-03-14 NOTE — Progress Notes (Signed)
Patient ID: Jacob Rosales, male   DOB: Nov 09, 1927, 81 y.o.   MRN: [id number]  Provider:  Rexene Edison. Mariea Rosales, D.O., C.M.D. Location:  [facility]  PCP: Jacob Kinnier, DO  Code Status: DNR Goals of Care: Advanced Directive information Does patient have an advance directive?: Yes, Type of Advance Directive: [place];Living will;Out of facility DNR (pink MOST or yellow form), Pre-existing out of facility DNR order (yellow form or pink MOST form): Pink MOST form placed in chart (order not valid for inpatient use);Yellow form placed in chart (order not valid for inpatient use), Does patient want to make changes to advanced directive?: No - Patient declined   Chief Complaint  Patient presents with  . New Admit To SNF    was admitted to rehab s/p hospitalization with increased shortness of breath, fever and he was found to have sepsis secondary to a UTI and some exacerbation of his chf    HPI: 81 y.o. male independent living resident with h/o chronic systolic chf s/p ICD/PCM, vascular dementia, CAD, HTN, insomnia, osteoporosis, falls who was seen upon admission to rehab here s/p hospitalization with increased shortness of breath and fever 7/11-7/16/16.  He was felt to have sepsis secondary to UTI and acute renal failure per discharge summary.  He was feeling weak, shaky, cold and when [facility] nurse evaluated him, he had a fever and his wife was concerned about aspiration given his known dysphagia.  In the ED, his temp was 102F, he was tachypneic.  His wbc was normal.  UA was nitrite positive.  Due to sob and cough, he was put on vanc and zosyn, given IVF bolus.  His lactic acid was 1.92.  His CXR showed volume overload (suggesting chf).   Re: his UTI felt to be the cause, he completed three days of rocephin IV and is now completing 4 days of po cipro.  Urine culture was negative.    His renal failure improved with holding lisinopril.    He remains on lasix for his chf with EF 25% on  echo earlier this year.  He did lose 3kg with increase in lasix from 20mg  to 40mg  daily.  Re; afib, he remains on coreg and dipyridamole due to warfarin intolerance.    BP is controlled with coreg and bidil  He is sleeping well with melatonin for insomnia.  When seen, he was overall feeling much better.  He is still weak and needs therapy.  He had no other complaints.    ROS: Review of Systems  Constitutional: Positive for malaise/fatigue. Negative for fever and chills.  HENT: Negative for congestion.   Eyes: Negative for blurred vision.  Respiratory: Positive for cough. Negative for shortness of breath.   Cardiovascular: Negative for chest pain and leg swelling.  Gastrointestinal: Positive for constipation. Negative for abdominal pain, blood in stool and melena.       Ok with stool softener  Genitourinary: Negative for dysuria, urgency and frequency.  Musculoskeletal: Positive for falls. Negative for myalgias.  Skin: Negative for itching and rash.  Neurological: Positive for weakness. Negative for dizziness and loss of consciousness.  Endo/Heme/Allergies: Bruises/bleeds easily.  Psychiatric/Behavioral: Positive for memory loss. The patient has insomnia.     Past Medical History  Diagnosis Date  . Complete AV block   . Chronic systolic heart failure   . Cardiomyopathy, ischemic   . Automatic implantable cardiac defibrillator in situ   . CAD (coronary artery disease) of artery bypass graft   .  Prostate cancer     S/P radiation rx  . Osteoporosis     A/P Vertebral compression fx's  . Hypertension   . Dyslipidemia   . TIA (transient ischemic attack)     H/o  . Lumbago 10/05/2012  . Cervicalgia 04/06/2012  . 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 Unspecified vitamin D deficiency 10/07/2011  . Unspecified pruritic disorder 10/07/2011  . Rash and other nonspecific skin eruption 10/07/2011  . Basal cell carcinoma of skin of lower limb, including hip 04/08/2011  .  Squamous cell carcinoma of skin of lower limb, including hip 04/08/2011  . Unspecified sinusitis (chronic) 10/08/2010  . Impotence of organic origin 10/08/2010  . Seborrhea 04/09/2010  . Insomnia, unspecified 04/09/2010  . Altered mental status 01/12/2010  . Full incontinence of feces 10/09/2009  . Atrial fibrillation 05/15/2006  . Unspecified urinary incontinence 05/15/2006  . Sebaceous cyst 10/09/2005  . Pathologic fracture of vertebrae 05/16/1991  . Fracture of C2 vertebra, closed     s/p cervical fusion 05/2013  . Osteoporosis with fracture 04/05/2013    History of vertebal fracture.   Marland Kitchen AICD (automatic cardioverter/defibrillator) present    Past Surgical History  Procedure Laterality Date  . Coronary artery bypass graft    . Hernia repair    . Kyphosis surgery    . Biv-icd  11/21/2010    [employer] ICD Model#CD3231-40 [phone]  . Cardiac defibrillator placement    . Cyst excision  10/2012    back Dr. Wilhemina Rosales  . Cyst removal neck  12/2012    basel cell (right) Dr. Sarajane Rosales  . Colonoscopy  2006    polyps benign  . Dexa  April 2010  . Posterior cervical fusion/foraminotomy N/A 05/21/2013    Procedure: Cervical One, Cervical Two, Cervical Three Posterior cervical fusion with lateral mass fixation;  Surgeon: Jacob Pitter, MD;  Location: [facility];  Service: Neurosurgery;  Laterality: N/A;  POSTERIOR CERVICAL FUSION/FORAMINOTOMY LEVEL 2   Social History:   reports that he quit smoking about 53 years ago. He has never used smokeless tobacco. He reports that he drinks about 1.2 oz of alcohol per week. He reports that he does not use illicit drugs.  Family History  Problem Relation Age of Onset  . Coronary artery disease Neg Hx   . Heart disease Mother     Allergies  Allergen Reactions  . Lisinopril Other (See Comments)    Acute renal failure  . Warfarin Sodium Other (See Comments)    REACTION: Stomach bleeding  . Amlodipine Besylate Swelling    Medications: Patient's Medications    New Prescriptions   No medications on file  Previous Medications   ACETAMINOPHEN (TYLENOL) 325 MG TABLET    Take 2 tablets (650 mg total) by mouth every 6 (six) hours as needed for mild pain (or Fever >/= 101).   BISACODYL (DULCOLAX) 5 MG EC TABLET    Take 1 tablet (5 mg total) by mouth daily as needed for moderate constipation.   CALCIUM CARBONATE-VITAMIN D (CALCIUM 600 + D PO)    Take 1 tablet by mouth daily.    CARVEDILOL (COREG) 6.25 MG TABLET    TAKE 1 TABLET TWICE A DAY WITH A MEAL TO STRENGTHEN THE HEART   CHOLECALCIFEROL (VITAMIN D) 1000 UNITS TABLET    Take 1,000 Units by mouth daily.    CIPROFLOXACIN (CIPRO) 250 MG TABLET    Take 1 tablet (250 mg total) by mouth 2 (two) times  daily. Total of 4 days,stop on 03/18/15   DIPYRIDAMOLE-ASPIRIN (AGGRENOX) 200-25 MG PER 12 HR CAPSULE    Take 1 capsule by mouth 2 (two) times daily.   FUROSEMIDE (LASIX) 20 MG TABLET    Take 2 tablets (40 mg total) by mouth daily.   ISOSORBIDE-HYDRALAZINE (BIDIL) 20-37.5 MG PER TABLET    Take 1 tablet by mouth 2 (two) times daily.   MELATONIN 5 MG CAPS    Take 1 capsule (5 mg total) by mouth at bedtime as needed.   MEMANTINE (NAMENDA XR) 28 MG CP24 24 HR CAPSULE    Take one tablet daily for memory   ONDANSETRON (ZOFRAN) 4 MG TABLET    Take 1 tablet (4 mg total) by mouth every 6 (six) hours as needed for nausea.   SENNA-DOCUSATE (SENOKOT-S) 8.6-50 MG PER TABLET    Take 2 tablets by mouth daily.   SIMVASTATIN (ZOCOR) 20 MG TABLET    TAKE 1 TABLET BY MOUTH AT BEDTIME FOR CHOLESTEROL   TRIAMCINOLONE (KENALOG) 0.025 % CREAM    Apply 1 application topically 2 (two) times daily as needed (rash).  Modified Medications   No medications on file  Discontinued Medications   No medications on file     Physical Exam: Filed Vitals:   03/14/15 1104  BP: 121/71  Pulse: 75  Temp: 98.8 F (37.1 C)  Resp: 20  Height: 5\' 9"  (1.753 m)  Weight: 140 lb 12.8 oz (63.866 kg)  SpO2: 92%   Body mass index is 20.78  kg/(m^2). Physical Exam  Constitutional:  Tall thin frail white male seated in recliner chair  HENT:  Head: Normocephalic and atraumatic.  Right Ear: External ear normal.  Left Ear: External ear normal.  Nose: Nose normal.  Mouth/Throat: Oropharynx is clear and moist. No oropharyngeal exudate.  Eyes: Conjunctivae and EOM are normal. Pupils are equal, round, and reactive to light.  Neck: Neck supple. No JVD present.  Cardiovascular: Intact distal pulses.   irreg irreg  Pulmonary/Chest: Breath sounds normal. No respiratory distress. He has no wheezes. He has no rales.  Breathes shallowly   Abdominal: Soft. Bowel sounds are normal.  Musculoskeletal: Normal range of motion. He exhibits no edema or tenderness.  Neurological: He is alert. He exhibits abnormal muscle tone.  Oriented to person, place, not time; has slow shuffling gait, walks with walker, hypophonic voice; abnormal tone of left side vs. Right with some mild cogwheeling rigidity  Skin: Skin is warm and dry. There is pallor.  Senile purpura present  Psychiatric: He has a normal mood and affect.     Labs reviewed: Basic Metabolic Panel:  Recent Labs  12/19/14 0125  03/10/15 1807 03/11/15 0425 03/13/15 0454  NA 141  < > 138 139 142  K 4.1  < > 4.3 3.6 3.4*  CL 106  < > 105 107 108  CO2 23  < > 21* 21* 24  GLUCOSE 117*  < > 105* 109* 98  BUN 48*  < > 35* 30* 23*  CREATININE 1.86*  < > 1.66* 1.51* 1.42*  CALCIUM 9.2  < > 9.1 8.7* 8.7*  MG 2.2  --   --   --   --   < > = values in this interval not displayed. Liver Function Tests:  Recent Labs  12/19/14 1923 12/21/14 0736 12/29/14 03/10/15 1807  AST 199* 106* 27 48*  ALT 257* 199* 38 51  ALKPHOS 65 63 42 70  BILITOT 2.1* 1.9*  --  1.2  PROT 6.5 6.6  --  6.9  ALBUMIN 3.7 3.6  --  3.8   No results for input(s): LIPASE, AMYLASE in the last 8760 hours.  Recent Labs  12/18/14 2042  AMMONIA 20   CBC:  Recent Labs  12/18/14 1411  12/20/14 0704  03/10/15 1807 03/11/15 0425  WBC 5.7  --  6.1 8.7 6.7  NEUTROABS 4.5  --   --  7.5  --   HGB 13.3  --  13.5 13.5 13.1  HCT 40.1  < > 43.2 40.5 40.7  MCV 96.9  --  97.7 95.5 94.9  PLT 108*  --  125* 151 144*  < > = values in this interval not displayed. Cardiac Enzymes:  Recent Labs  12/18/14 2042 12/19/14 0125 12/19/14 0755  TROPONINI 0.10* 0.11* 0.09*   BNP: Invalid input(s): POCBNP  Lab Results  Component Value Date   HGBA1C * 01/13/2010    5.7 (NOTE)                                                                       According to the ADA Clinical Practice Recommendations for 2011, when HbA1c is used as a screening test:   >=6.5%   Diagnostic of Diabetes Mellitus           (if abnormal result  is confirmed)  5.7-6.4%   Increased risk of developing Diabetes Mellitus  References:Diagnosis and Classification of Diabetes Mellitus,Diabetes TGYB,6389,37(DSKAJ 1):S62-S69 and Standards of Medical Care in         Diabetes - 2011,Diabetes Care,2011,34  (Suppl 1):S11-S61.   Lab Results  Component Value Date   TSH 1.81 04/27/2014   Lab Results  Component Value Date   [id number] 3559* 12/18/2014   No results found for: FOLATE No results found for: IRON, TIBC, FERRITIN  Imaging and Procedures obtained prior to SNF admission: 02/03/15:  Swallowing eval 03/10/15:  CXR:  Cardiomegaly with pulmonary vascular congestion and possible mild interstitial pulmonary edema. New small right pleural effusion  03/11/15:  CXR:  Cardiomegaly with mild interstitial edema and small right pleural effusion.  Patient Care Team: Gayland Curry, DO as PCP - General (Geriatric Medicine) Jarome Matin, MD as Consulting Physician (Dermatology) Evans Lance, MD as Consulting Physician (Cardiology) [facility] [place], MD as Consulting Physician (Neurosurgery) Bjorn Loser, MD as Consulting Physician (Urology)  Assessment/Plan 1. Sepsis secondary to UTI -per hospital  records, but culture did not grow any bacteria  2. Aspiration into lower respiratory tract, initial encounter -had tachypnea, fever, and has dysphagia and is not adherent with dietary modifications and use of puree with pills -ST eval and tx  3. 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 Mixed Alzheimer's and  vascular dementia -seems mentally back to baseline -he requires a lot of prompting and help with adls at home by his wife, but can ambulate with a walker or cane (not safe with cane, but he insists on using it at times) -cont namenda 28mg  daily which seemed helpful last rehab stay and keep him off ditropan which resulted in increased confusion  6. Chronic atrial fibrillation -cont coreg, aggrenox for stroke prevention due to intolerance of warfarin  7. Essential hypertension -bp at goal with current coreg and bidil, cont and monitor for postural lightheadedness   8. Insomnia -doing well with use of melatonin  Functional status:  Needs prompting with bathing, dressing, and grooming, but able to do them; needs to use walker  Family/ staff Communication: discussed with his wife who also helped with history and discussed plans with rehab nurse   Labs/tests ordered:  Cbc, cmp, CXR--PA and lateral, PT, OT, ST  Kennady Zimmerle L. Roshawn Ayala, D.O. [facility] [address], [place] [address] Cell Phone (Mon-Fri  8am-5pm):  [phone] On Call:  [phone] & follow prompts after 5pm & weekends Office Phone:  [phone] Office Fax:  [phone]

## 2015-03-15 LAB — CULTURE, BLOOD (ROUTINE X 2): CULTURE: NO GROWTH

## 2015-03-16 ENCOUNTER — Non-Acute Institutional Stay (SKILLED_NURSING_FACILITY): Payer: Medicare Other | Admitting: Adult Health

## 2015-03-16 ENCOUNTER — Encounter: Payer: Self-pay | Admitting: Adult Health

## 2015-03-16 DIAGNOSIS — R1314 Dysphagia, pharyngoesophageal phase: Secondary | ICD-10-CM

## 2015-03-16 DIAGNOSIS — I952 Hypotension due to drugs: Secondary | ICD-10-CM | POA: Diagnosis not present

## 2015-03-16 LAB — CULTURE, BLOOD (ROUTINE X 2): Culture: NO GROWTH

## 2015-03-16 NOTE — Progress Notes (Addendum)
Patient ID: Jacob Rosales, male   DOB: 1928-04-09, 81 y.o.   MRN: [id number]     Patient Care Team: Gayland Curry, DO as PCP - General (Geriatric Medicine) Jarome Matin, MD as Consulting Physician (Dermatology) Evans Lance, MD as Consulting Physician (Cardiology) [facility] Earnie Larsson, MD as Consulting Physician (Neurosurgery) Bjorn Loser, MD as Consulting Physician (Urology)  Nursing Home Location:  [facility]   Code Status: DNR  Place of Service: SNF (31)  Chief Complaint  Patient presents with  . Acute Visit    low BP    HPI:  81 y.o. male independent living resident with h/o chronic systolic chf s/p ICD/PCM, vascular dementia, CAD, HTN, insomnia, osteoporosis, falls who was seen upon admission to rehab here s/p hospitalization with increased shortness of breath and fever 7/11-7/16/16.  He was felt to have sepsis secondary to UTI and acute renal failure per discharge summary. He is currently on Cipro for a UTI, however, his culture was negative. I was asked to see him today due to a low BP of 77/45 electric, 90/52 manual. He was asymptomatic at the time. He is on several meds for CHF/HTN.  He reports that he was able to walk with therapy and feels he is getting stronger. He denies weakness or dizziness.  He also has a hx of dysphagia and is currently on a mech soft diet with thin liquids. He is coughing significantly through lunch for our visit. The staff reports that he has difficulty swallowing pills and is refusing applesauce with his pills.     Review of Systems:  Review of Systems  Constitutional: Negative for fever, chills, activity change and appetite change.  HENT: Negative for congestion.   Respiratory: Positive for cough. Negative for choking, shortness of breath and wheezing.   Cardiovascular: Negative for chest pain, palpitations and leg swelling.  Gastrointestinal: Negative for abdominal pain and abdominal  distention.  Genitourinary: Negative for dysuria, flank pain and difficulty urinating.  Neurological: Negative for dizziness, seizures, facial asymmetry, weakness, light-headedness and numbness.  Psychiatric/Behavioral: Positive for confusion. Negative for agitation.    Medications: Patient's Medications  New Prescriptions   No medications on file  Previous Medications   ACETAMINOPHEN (TYLENOL) 325 MG TABLET    Take 2 tablets (650 mg total) by mouth every 6 (six) hours as needed for mild pain (or Fever >/= 101).   BISACODYL (DULCOLAX) 5 MG EC TABLET    Take 1 tablet (5 mg total) by mouth daily as needed for moderate constipation.   CALCIUM CARBONATE-VITAMIN D (CALCIUM 600 + D PO)    Take 1 tablet by mouth daily.    CARVEDILOL (COREG) 6.25 MG TABLET    TAKE 1 TABLET TWICE A DAY WITH A MEAL TO STRENGTHEN THE HEART   CHOLECALCIFEROL (VITAMIN D) 1000 UNITS TABLET    Take 1,000 Units by mouth daily.    CIPROFLOXACIN (CIPRO) 250 MG TABLET    Take 1 tablet (250 mg total) by mouth 2 (two) times daily. Total of 4 days,stop on 03/18/15   DIPYRIDAMOLE-ASPIRIN (AGGRENOX) 200-25 MG PER 12 HR CAPSULE    Take 1 capsule by mouth 2 (two) times daily.   FUROSEMIDE (LASIX) 20 MG TABLET    Take 2 tablets (40 mg total) by mouth daily.   ISOSORBIDE-HYDRALAZINE (BIDIL) 20-37.5 MG PER TABLET    Take 1 tablet by mouth 2 (two) times daily.   MELATONIN 5 MG CAPS    Take 1 capsule (5 mg  total) by mouth at bedtime as needed.   MEMANTINE (NAMENDA XR) 28 MG CP24 24 HR CAPSULE    Take one tablet daily for memory   ONDANSETRON (ZOFRAN) 4 MG TABLET    Take 1 tablet (4 mg total) by mouth every 6 (six) hours as needed for nausea.   SENNA-DOCUSATE (SENOKOT-S) 8.6-50 MG PER TABLET    Take 2 tablets by mouth daily.   SIMVASTATIN (ZOCOR) 20 MG TABLET    TAKE 1 TABLET BY MOUTH AT BEDTIME FOR CHOLESTEROL   TRIAMCINOLONE (KENALOG) 0.025 % CREAM    Apply 1 application topically 2 (two) times daily as needed (rash).  Modified  Medications   No medications on file  Discontinued Medications   No medications on file     Physical Exam:  Filed Vitals:   03/16/15 1329  BP: 90/52  Pulse: 70  Temp: 97.1 F (36.2 C)  Resp: 20  Weight: 139 lb 9.6 oz (63.322 kg)  SpO2: 92%    Physical Exam  Constitutional: No distress.  cachetic  HENT:  Poor dentition  Neck: Normal range of motion. Neck supple. No JVD present. No tracheal deviation present. No thyromegaly present.  Cardiovascular: Normal rate and regular rhythm.   No murmur heard. No edema  Pulmonary/Chest: Effort normal and breath sounds normal. No respiratory distress. He has no wheezes. He has no rales.  Abdominal: Soft. Bowel sounds are normal. He exhibits no distension.  Lymphadenopathy:    He has no cervical adenopathy.  Neurological: He is alert. No cranial nerve deficit.  Oriented x2  Skin: Skin is warm and dry. He is not diaphoretic.  Psychiatric: Affect normal.    Labs reviewed/Significant Diagnostic Results:  Basic Metabolic Panel:  Recent Labs  12/19/14 0125  03/10/15 1807 03/11/15 0425 03/13/15 0454  NA 141  < > 138 139 142  K 4.1  < > 4.3 3.6 3.4*  CL 106  < > 105 107 108  CO2 23  < > 21* 21* 24  GLUCOSE 117*  < > 105* 109* 98  BUN 48*  < > 35* 30* 23*  CREATININE 1.86*  < > 1.66* 1.51* 1.42*  CALCIUM 9.2  < > 9.1 8.7* 8.7*  MG 2.2  --   --   --   --   < > = values in this interval not displayed. Liver Function Tests:  Recent Labs  12/19/14 1923 12/21/14 0736 12/29/14 03/10/15 1807  AST 199* 106* 27 48*  ALT 257* 199* 38 51  ALKPHOS 65 63 42 70  BILITOT 2.1* 1.9*  --  1.2  PROT 6.5 6.6  --  6.9  ALBUMIN 3.7 3.6  --  3.8   No results for input(s): LIPASE, AMYLASE in the last 8760 hours.  Recent Labs  12/18/14 2042  AMMONIA 20   CBC:  Recent Labs  12/18/14 1411  12/20/14 0704 03/10/15 1807 03/11/15 0425  WBC 5.7  --  6.1 8.7 6.7  NEUTROABS 4.5  --   --  7.5  --   HGB 13.3  --  13.5 13.5 13.1  HCT  40.1  < > 43.2 40.5 40.7  MCV 96.9  --  97.7 95.5 94.9  PLT 108*  --  125* 151 144*  < > = values in this interval not displayed. CBG:  Recent Labs  12/18/14 1425  GLUCAP 142*   TSH:  Recent Labs  04/27/14 1539  TSH 1.81   A1C: Lab Results  Component Value Date  HGBA1C * 01/13/2010    5.7 (NOTE)                                                                       According to the ADA Clinical Practice Recommendations for 2011, when HbA1c is used as a screening test:   >=6.5%   Diagnostic of Diabetes Mellitus           (if abnormal result  is confirmed)  5.7-6.4%   Increased risk of developing Diabetes Mellitus  References:Diagnosis and Classification of Diabetes Mellitus,Diabetes QIHK,7425,95(GLOVF 1):S62-S69 and Standards of Medical Care in         Diabetes - 2011,Diabetes IEPP,2951,88  (Suppl 1):S11-S61.   Lipid Panel:  Recent Labs  07/05/14  CHOL 120  HDL 49  LDLCALC 63  TRIG 76       Assessment/Plan  1) Low BP -improved from early and was able to work with therapy without issues -Check BMP and CBC previously ordered for hospital f/u -would hold Coreg for systolic CZ<66 due to fall risk, if held more than two times report to [facility]  2) Dysphagia -has refused pills in applesauce but now he agrees -he will continue to work with [place] and she is going to change his diet to NTL due to coughing with meals -has has already had two swallow studies this year so there is no need to order another one at this point -He is weaker now and would be best suited to a more restrictive diet but would hopefully be able to advance in the future as he does not like modified diets -of note he has very poor dentition and would benefit from frequent oral care    Cindi Carbon, [place] [phone]

## 2015-03-20 ENCOUNTER — Ambulatory Visit
Admission: RE | Admit: 2015-03-20 | Discharge: 2015-03-20 | Disposition: A | Discharge status: Home / Self Care | Payer: Medicare Other | Source: Ambulatory Visit | Attending: Internal Medicine | Admitting: Internal Medicine

## 2015-03-20 ENCOUNTER — Telehealth: Payer: Self-pay

## 2015-03-20 DIAGNOSIS — R0602 Shortness of breath: Secondary | ICD-10-CM

## 2015-03-20 NOTE — Telephone Encounter (Signed)
Call from [facility] requesting orders put into computer, patient there for Chest x-ray (Pa & lat) ok by Dr. Mariea Clonts ICD R06.02

## 2015-03-21 ENCOUNTER — Non-Acute Institutional Stay (SKILLED_NURSING_FACILITY): Payer: Medicare Other | Admitting: Internal Medicine

## 2015-03-21 DIAGNOSIS — R0602 Shortness of breath: Secondary | ICD-10-CM

## 2015-03-21 DIAGNOSIS — R1314 Dysphagia, pharyngoesophageal phase: Secondary | ICD-10-CM | POA: Diagnosis not present

## 2015-03-21 DIAGNOSIS — F0280 Dementia in other diseases classified elsewhere without behavioral disturbance: Secondary | ICD-10-CM | POA: Diagnosis not present

## 2015-03-21 DIAGNOSIS — I5023 Acute on chronic systolic (congestive) heart failure: Secondary | ICD-10-CM | POA: Diagnosis not present

## 2015-03-21 DIAGNOSIS — G214 Vascular parkinsonism: Secondary | ICD-10-CM

## 2015-03-21 DIAGNOSIS — T17800D Unspecified foreign body in other parts of respiratory tract causing asphyxiation, subsequent encounter: Secondary | ICD-10-CM | POA: Diagnosis not present

## 2015-03-21 DIAGNOSIS — F0150 Vascular dementia without behavioral disturbance: Secondary | ICD-10-CM

## 2015-03-21 DIAGNOSIS — I482 Chronic atrial fibrillation, unspecified: Secondary | ICD-10-CM

## 2015-03-21 DIAGNOSIS — G309 Alzheimer's disease, unspecified: Secondary | ICD-10-CM

## 2015-03-21 LAB — CBC AND DIFFERENTIAL
HEMATOCRIT: 38 % — AB (ref 41–53)
Hemoglobin: 12.8 g/dL — AB (ref 13.5–17.5)
PLATELETS: 187 10*3/uL (ref 150–399)
WBC: 4.7 10^3/mL

## 2015-03-21 LAB — BASIC METABOLIC PANEL
BUN: 31 mg/dL — AB (ref 4–21)
Creatinine: 1.2 mg/dL (ref 0.6–1.3)
Glucose: 80 mg/dL
Potassium: 4.1 mmol/L (ref 3.4–5.3)
Sodium: 136 mmol/L — AB (ref 137–147)

## 2015-03-21 NOTE — Progress Notes (Signed)
Patient ID: Jacob Rosales, male   DOB: 05/25/1928, 81 y.o.   MRN: [id number]  Location:  [facility] Provider:  Rexene Edison. Mariea Clonts, D.O., C.M.D.  Code Status:  DNR Goals of care: Advanced Directive information Does patient have an advance directive?: Yes, Type of Advance Directive: [place];Living will;Out of facility DNR (pink MOST or yellow form), Pre-existing out of facility DNR order (yellow form or pink MOST form): Pink MOST form placed in chart (order not valid for inpatient use);Yellow form placed in chart (order not valid for inpatient use), Does patient want to make changes to advanced directive?: No - Patient declined  Chief Complaint  Patient presents with  . Discharge Note    dysphagia ongoing concern    HPI:  81 yo white male who lives in [place] with his wife was here for rehab after his last hospitalization with what seemed to be an aspiration episode.  He was treated for sepsis from a UTI and chf.  He has completed his abx and has done well here with PT, OT.  He continues to work with [facility], as well.  ST has concerns that he does not always follow the proper steps during meals to prevent aspiration.  Last time he was here, he did well until he was discharged home and then went back on a regular diet and did not follow the steps he'd been given.  He cannot have e-stim done due to his defibrillator.  ST recommends ground meats, but will continue mechanical soft recommendation due to his quality of life.  She is recommending he have home care assist with feeding cues at meals.  I called his wife, Jacob Rosales, to discuss this plan and she was in agreement and immediately contacted them to get the ball rolling.  He will be d/c'd home as soon as they are able to come out for his meals.    Review of Systems:  Review of Systems  Constitutional: Negative for fever, chills and malaise/fatigue.  HENT: Positive for hearing loss. Negative for congestion.   Eyes: Negative for blurred  vision.       Glasses  Respiratory: Negative for cough and shortness of breath.   Cardiovascular: Negative for chest pain and leg swelling.  Gastrointestinal: Negative for abdominal pain, constipation, blood in stool and melena.  Genitourinary: Negative for dysuria.  Musculoskeletal: Positive for falls.  Neurological: Positive for tremors. Negative for weakness.  Psychiatric/Behavioral: Positive for memory loss.    Past Medical History  Diagnosis Date  . Complete AV block   . Chronic systolic heart failure   . Cardiomyopathy, ischemic   . Automatic implantable cardiac defibrillator in situ   . CAD (coronary artery disease) of artery bypass graft   . Prostate cancer     S/P radiation rx  . Osteoporosis     A/P Vertebral compression fx's  . Hypertension   . Dyslipidemia   . TIA (transient ischemic attack)     H/o  . Lumbago 10/05/2012  . Cervicalgia 04/06/2012  . 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 Unspecified vitamin D deficiency 10/07/2011  . Unspecified pruritic disorder 10/07/2011  . Rash and other nonspecific skin eruption 10/07/2011  . Basal cell carcinoma of skin of lower limb, including hip 04/08/2011  . Squamous cell carcinoma of skin of lower limb, including hip 04/08/2011  . Unspecified sinusitis (chronic) 10/08/2010  . Impotence of organic origin 10/08/2010  . Seborrhea 04/09/2010  . Insomnia, unspecified 04/09/2010  .  Altered mental status 01/12/2010  . Full incontinence of feces 10/09/2009  . Atrial fibrillation 05/15/2006  . Unspecified urinary incontinence 05/15/2006  . Sebaceous cyst 10/09/2005  . Pathologic fracture of vertebrae 05/16/1991  . Fracture of C2 vertebra, closed     s/p cervical fusion 05/2013  . Osteoporosis with fracture 04/05/2013    History of vertebal fracture.   Marland Kitchen AICD (automatic cardioverter/defibrillator) present     Patient Active Problem List   Diagnosis Date Noted  . Sepsis 03/10/2015  . UTI (lower urinary tract infection) 03/10/2015    . Acute cystitis without hematuria   . Insomnia 01/05/2015  . Protein-calorie malnutrition, severe 12/22/2014  . Confusion   . LFT elevation   . Acute on chronic systolic CHF (congestive heart failure), NYHA class 3 12/18/2014  . SOB (shortness of breath) 12/18/2014  . Unstable gait 07/25/2014  . Memory loss 07/25/2014  . Prostate cancer   . Cognitive change 09/29/2013  . Rash and nonspecific skin eruption 07/18/2013  . Loss of weight 07/05/2013  . Right foot drop 07/05/2013  . Essential hypertension 07/05/2013  . Generalized anxiety disorder 05/30/2013  . Unspecified constipation 05/28/2013  . Fracture of second cervical vertebra 05/21/2013  . Osteoporosis with fracture 04/05/2013  . Dyslipidemia   . CAD (coronary artery disease) of artery bypass graft   . Rhinorrhea 02/01/2013  . Advanced care planning/counseling discussion 01/14/2013  . Lumbago 10/05/2012  . Unspecified vitamin D deficiency 10/07/2011  . Insomnia, unspecified 04/09/2010  . Full incontinence of feces 10/09/2009  . CARDIOMYOPATHY, ISCHEMIC 05/25/2009  . AV BLOCK, COMPLETE 05/25/2009  . Chronic systolic heart failure 31/54/0086  . Automatic implantable cardioverter-defibrillator in situ 05/25/2009  . Atrial fibrillation 05/15/2006  . Unspecified urinary incontinence 05/15/2006    Allergies  Allergen Reactions  . Lisinopril Other (See Comments)    Acute renal failure  . Warfarin Sodium Other (See Comments)    REACTION: Stomach bleeding  . Amlodipine Besylate Swelling    Medications: Patient's Medications  New Prescriptions   No medications on file  Previous Medications   ACETAMINOPHEN (TYLENOL) 325 MG TABLET    Take 2 tablets (650 mg total) by mouth every 6 (six) hours as needed for mild pain (or Fever >/= 101).   BISACODYL (DULCOLAX) 5 MG EC TABLET    Take 1 tablet (5 mg total) by mouth daily as needed for moderate constipation.   CALCIUM CARBONATE-VITAMIN D (CALCIUM 600 + D PO)    Take 1 tablet  by mouth daily.    CHOLECALCIFEROL (VITAMIN D) 1000 UNITS TABLET    Take 1,000 Units by mouth daily.    DIPYRIDAMOLE-ASPIRIN (AGGRENOX) 200-25 MG PER 12 HR CAPSULE    Take 1 capsule by mouth 2 (two) times daily.   FUROSEMIDE (LASIX) 20 MG TABLET    Take 2 tablets (40 mg total) by mouth daily.   ISOSORBIDE-HYDRALAZINE (BIDIL) 20-37.5 MG PER TABLET    Take 1 tablet by mouth 2 (two) times daily.   MELATONIN 5 MG CAPS    Take 1 capsule (5 mg total) by mouth at bedtime as needed.   MEMANTINE (NAMENDA XR) 28 MG CP24 24 HR CAPSULE    Take one tablet daily for memory   ONDANSETRON (ZOFRAN) 4 MG TABLET    Take 1 tablet (4 mg total) by mouth every 6 (six) hours as needed for nausea.   SENNA-DOCUSATE (SENOKOT-S) 8.6-50 MG PER TABLET    Take 2 tablets by mouth daily.   SIMVASTATIN (ZOCOR) 20 MG  TABLET    TAKE 1 TABLET BY MOUTH AT BEDTIME FOR CHOLESTEROL   TRIAMCINOLONE (KENALOG) 0.025 % CREAM    Apply 1 application topically 2 (two) times daily as needed (rash).  Modified Medications   Modified Medication Previous Medication   CARVEDILOL (COREG) 6.25 MG TABLET carvedilol (COREG) 6.25 MG tablet      Take one tablet by mouth twice daily with a meal to strengthen the heart    TAKE 1 TABLET TWICE A DAY WITH A MEAL TO STRENGTHEN THE HEART  Discontinued Medications   CARVEDILOL (COREG) 6.25 MG TABLET    TAKE 1 TABLET TWICE A DAY WITH A MEAL TO STRENGTHEN THE HEART    Physical Exam: Filed Vitals:   03/21/15 1252  BP: 113/68  Pulse: 63  Temp: 96.6 F (35.9 C)  Resp: 20  SpO2: 96%   There is no weight on file to calculate BMI.  Physical Exam  Constitutional: No distress.  Tall frail white male seated in recliner in his room  HENT:  Head: Normocephalic and atraumatic.  Cardiovascular: Normal heart sounds and intact distal pulses.   irreg irreg  Pulmonary/Chest: Effort normal and breath sounds normal. He has no wheezes. He has no rales.  Abdominal: Soft. Bowel sounds are normal. He exhibits no  distension. There is no tenderness.  Musculoskeletal:  Unsteady shuffling gait and some rigidity of left more than right arm and flat affect  Neurological: He is alert.  Oriented to person and place, not precise time  Skin: Skin is warm and dry.  Psychiatric:  Flat affect; some hypophonia also    Labs reviewed: Basic Metabolic Panel:  Recent Labs  12/19/14 0125  03/10/15 1807 03/11/15 0425 03/13/15 0454 03/21/15  NA 141  < > 138 139 142 136*  K 4.1  < > 4.3 3.6 3.4* 4.1  CL 106  < > 105 107 108  --   CO2 23  < > 21* 21* 24  --   GLUCOSE 117*  < > 105* 109* 98  --   BUN 48*  < > 35* 30* 23* 31*  CREATININE 1.86*  < > 1.66* 1.51* 1.42* 1.2  CALCIUM 9.2  < > 9.1 8.7* 8.7*  --   MG 2.2  --   --   --   --   --   < > = values in this interval not displayed.  Liver Function Tests:  Recent Labs  12/19/14 1923 12/21/14 0736 12/29/14 03/10/15 1807  AST 199* 106* 27 48*  ALT 257* 199* 38 51  ALKPHOS 65 63 42 70  BILITOT 2.1* 1.9*  --  1.2  PROT 6.5 6.6  --  6.9  ALBUMIN 3.7 3.6  --  3.8    CBC:  Recent Labs  12/18/14 1411  12/20/14 0704 03/10/15 1807 03/11/15 0425 03/21/15  WBC 5.7  --  6.1 8.7 6.7 4.7  NEUTROABS 4.5  --   --  7.5  --   --   HGB 13.3  --  13.5 13.5 13.1 12.8*  HCT 40.1  < > 43.2 40.5 40.7 38*  MCV 96.9  --  97.7 95.5 94.9  --   PLT 108*  --  125* 151 144* 187  < > = values in this interval not displayed.  Lab Results  Component Value Date   TSH 1.81 04/27/2014   Lab Results  Component Value Date   HGBA1C * 01/13/2010    5.7 (NOTE)  According to the ADA Clinical Practice Recommendations for 2011, when HbA1c is used as a screening test:   >=6.5%   Diagnostic of Diabetes Mellitus           (if abnormal result  is confirmed)  5.7-6.4%   Increased risk of developing Diabetes Mellitus  References:Diagnosis and Classification of Diabetes Mellitus,Diabetes ZOXW,9604,54(UJWJX  1):S62-S69 and Standards of Medical Care in         Diabetes - 2011,Diabetes BJYN,8295,62  (Suppl 1):S11-S61.   Lab Results  Component Value Date   CHOL 120 07/05/2014   HDL 49 07/05/2014   LDLCALC 63 07/05/2014   TRIG 76 07/05/2014   CHOLHDL 2.3 01/13/2010    Significant Diagnostic Results reviewed since last visit:  03/10/15:  CXR:  Cardiomegaly with pulmonary vascular congestion and possible mildinterstitial pulmonary edema. New small right pleural effusion. 03/11/15:  CXR:  Cardiomegaly with mild interstitial edema and small right pleural effusion. 03/20/15:  CXR:  Improving CHF with near total resolution of the small right pleural effusion. Patient Care Team: Gayland Curry, DO as PCP - General (Geriatric Medicine) Jarome Matin, MD as Consulting Physician (Dermatology) Evans Lance, MD as Consulting Physician (Cardiology) [facility] [place], MD as Consulting Physician (Neurosurgery) Bjorn Loser, MD as Consulting Physician (Urology)  Assessment/Plan 1. Shortness of breath -improved, is now able to walk with PT, still some dyspnea on exertion/class II symptoms of chf  2. Aspiration into lower respiratory tract, subsequent encounter -resolved, but at risk for chronic aspiration and recurrent aspiration -cont precautions  3. 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 Mixed Alzheimer's and vascular dementia -prior strokes on brain imaging but also gradual cognitive decline -cont support at home from his wife and now caregiver to help with feeding cues -he is likely to need AL care soon or more frequent caregiver  presence to keep his wife healthy also -continues on aggrenox--had been on dipyridamole alone prehospitalization  6. Acute on chronic systolic heart failure -seems this resolved, weight has come down, edema, cough, and effusion better on CXR yesterday -cont bidil, lasix; potassium has been normal here  7. Chronic atrial fibrillation -rate controlled; cont aggrenox and coreg  Family/ staff Communication: discharge plan discussed with rehab nurse, ST, nurse manager and patient's wife, Jacob Rosales--pt to f/u in [facility] as scheduled  Jacob Rosales L. Annasofia Pohl, D.O. [facility] [address], [place] [address] Cell Phone (Mon-Fri 8am-5pm):  [phone] On Call:  [phone] & follow prompts after 5pm & weekends Office Phone:  [phone] Office Fax:  [phone]

## 2015-03-25 ENCOUNTER — Other Ambulatory Visit: Payer: Self-pay | Admitting: Nurse Practitioner

## 2015-03-27 ENCOUNTER — Other Ambulatory Visit: Payer: Self-pay | Admitting: *Deleted

## 2015-03-27 MED ORDER — CARVEDILOL 6.25 MG PO TABS: ORAL_TABLET | ORAL | Status: DC

## 2015-03-27 NOTE — Telephone Encounter (Signed)
[facility] 

## 2015-04-01 ENCOUNTER — Encounter: Payer: Self-pay | Admitting: Internal Medicine

## 2015-04-10 ENCOUNTER — Other Ambulatory Visit: Payer: Self-pay | Admitting: *Deleted

## 2015-04-10 MED ORDER — MEMANTINE HCL ER 28 MG PO CP24: ORAL_CAPSULE | ORAL | Status: DC

## 2015-04-10 NOTE — Telephone Encounter (Signed)
[facility] 

## 2015-04-11 ENCOUNTER — Encounter: Payer: Self-pay | Admitting: Nurse Practitioner

## 2015-04-11 NOTE — Progress Notes (Signed)
Electrophysiology Office Note Date: 04/12/2015  ID:  Jacob Rosales, DOB 19-Feb-1928, MRN [id number]  PCP: Hollace Kinnier, DO Electrophysiologist: Jacob Rosales  CC: Routine ICD follow-up  Jacob Rosales is a 81 y.o. male seen today for Dr Jacob Rosales.  He was recently admitted for UTI and pneumonia.  At discharge, he went to assisted living for a short time but is now back at independent living at [facility].  Since discharge, he reports that he has been doing reasonably well.  He denies chest pain, palpitations, PND, orthopnea, nausea, vomiting, dizziness, syncope, edema, weight gain, or early satiety.  He has not had ICD shocks. He has chronic dyspnea on exertion and is using O2 prn at home.   Device History: STJ CRTD implanted 2007 for CHB, ICM; gen change 2012 History of appropriate therapy: yes History of AAD therapy: no   Past Medical History  Diagnosis Date  . Complete AV block   . Chronic systolic heart failure     a. s/p STJ CRTD  . Cardiomyopathy, ischemic   . CAD (coronary artery disease) of artery bypass graft   . Prostate cancer     S/P radiation rx  . Osteoporosis     A/P Vertebral compression fx's  . Hypertension   . Dyslipidemia   . TIA (transient ischemic attack)        . Lumbago    . Cervicalgia    . Unspecified vitamin D deficiency    . Basal cell carcinoma of skin of lower limb, including hip    . Squamous cell carcinoma of skin of lower limb, including hip    . Impotence of organic origin    . Insomnia, unspecified    . Paroxysmal atrial fibrillation    . Fracture of C2 vertebra, closed     s/p cervical fusion 05/2013   Past Surgical History  Procedure Laterality Date  . Coronary artery bypass graft    . Hernia repair    . Kyphosis surgery    . Biv-icd  11/21/2010    [employer] ICD Model#CD3231-40 [id number]  . Cardiac defibrillator placement    . Cyst excision  10/2012    back Dr. Wilhemina Rosales  . Cyst removal neck  12/2012    basel cell (right)  Dr. Sarajane Rosales  . Colonoscopy  2006    polyps benign  . Dexa  April 2010  . Posterior cervical fusion/foraminotomy N/A 05/21/2013    Procedure: Cervical One, Cervical Two, Cervical Three Posterior cervical fusion with lateral mass fixation;  Surgeon: Jacob Pitter, MD;  Location: [facility];  Service: Neurosurgery;  Laterality: N/A;  POSTERIOR CERVICAL FUSION/FORAMINOTOMY LEVEL 2    Current Outpatient Prescriptions  Medication Sig Dispense Refill  . acetaminophen (TYLENOL) 325 MG tablet Take 2 tablets (650 mg total) by mouth every 6 (six) hours as needed for mild pain (or Fever >/= 101).    . bisacodyl (DULCOLAX) 5 MG EC tablet Take 1 tablet (5 mg total) by mouth daily as needed for moderate constipation. 30 tablet 0  . Calcium Carbonate-Vitamin D (CALCIUM 600 + D PO) Take 1 tablet by mouth daily.     . carvedilol (COREG) 6.25 MG tablet Take one tablet by mouth twice daily with a meal to strengthen the heart 180 tablet 1  . cholecalciferol (VITAMIN D) 1000 UNITS tablet Take 1,000 Units by mouth daily.     Marland Kitchen dipyridamole-aspirin (AGGRENOX) 200-25 MG per 12 hr capsule Take 1 capsule by mouth 2 (two) times  daily.    . furosemide (LASIX) 20 MG tablet Take 2 tablets (40 mg total) by mouth daily. 30 tablet   . isosorbide-hydrALAZINE (BIDIL) 20-37.5 MG per tablet Take 1 tablet by mouth 2 (two) times daily.    . Melatonin 5 MG CAPS Take 1 capsule (5 mg total) by mouth at bedtime as needed. 30 capsule 0  . memantine (NAMENDA XR) 28 MG CP24 24 hr capsule Take one tablet daily for memory 30 capsule 5  . ondansetron (ZOFRAN) 4 MG tablet Take 1 tablet (4 mg total) by mouth every 6 (six) hours as needed for nausea. 20 tablet 0  . senna-docusate (SENOKOT-S) 8.6-50 MG per tablet Take 2 tablets by mouth daily.    . simvastatin (ZOCOR) 20 MG tablet TAKE 1 TABLET BY MOUTH AT BEDTIME FOR CHOLESTEROL 90 tablet 1  . triamcinolone (KENALOG) 0.025 % cream Apply 1 application topically 2 (two) times daily as needed (rash).       No current facility-administered medications for this visit.    Allergies:   Lisinopril; Warfarin sodium; and Amlodipine besylate   Social History: Social History   Social History  . Marital Status: Married    Spouse Name: N/A  . Number of Children: N/A  . Years of Education: N/A   Occupational History  . Retired Chief Financial Officer    Social History Main Topics  . Smoking status: Former Smoker    Quit date: 01/13/1962  . Smokeless tobacco: Never Used  . Alcohol Use: 1.2 oz/week    1 Glasses of wine, 1 Cans of beer per week  . Drug Use: No  . Sexual Activity: Not Currently   Other Topics Concern  . Not on file   Social History Narrative   Patient is Married [name]), retired Chief Financial Officer.    Lives with spouse in single level home at [place] since 2010.   Smoked cigars, and pipe, stopped 1960's,  minimal alcohol history .   Patient has Advanced planning documents:  Living Will, DNR   Walks with cane                Family History: Family History  Problem Relation Age of Onset  . Coronary artery disease Neg Hx   . Heart disease Mother     Review of Systems: All other systems reviewed and are otherwise negative except as noted above.   Physical Exam: VS:  BP 110/70 mmHg  Pulse 64  Ht 5\' 9"  (1.753 m)  Wt 151 lb 3.2 oz (68.584 kg)  BMI 22.32 kg/m2  SpO2 97% , BMI Body mass index is 22.32 kg/(m^2).  GEN- The patient is well appearing, alert and oriented x 3 today.   HEENT: normocephalic, atraumatic; sclera clear, conjunctiva pink; hearing intact; oropharynx clear; neck supple  Lungs- Clear to ausculation bilaterally, normal work of breathing.  Heart- Regular rate and rhythm (paced) GI- soft, non-tender, non-distended, bowel sounds present  Extremities- no clubbing, cyanosis, or edema; DP/PT/radial pulses 2+ bilaterally MS- no significant deformity or atrophy Skin- warm and dry, no rash or lesion; ICD pocket well healed Psych- euthymic mood, full  affect Neuro- strength and sensation are intact  ICD interrogation- reviewed in detail today,  See PACEART report  EKG:  EKG is not ordered today.  Recent Labs: 04/27/2014: TSH 1.81 06/16/2014: Pro B Natriuretic peptide (BNP) 982.0* 12/19/2014: Magnesium 2.2 12/21/2014: B Natriuretic Peptide 2091.6* 03/10/2015: ALT 51 03/21/2015: BUN 31*; Creatinine 1.2; Hemoglobin 12.8*; Platelets 187; Potassium 4.1; Sodium 136*   Wt  Readings from Last 3 Encounters:  04/12/15 151 lb 3.2 oz (68.584 kg)  03/16/15 139 lb 9.6 oz (63.322 kg)  03/14/15 140 lb 12.8 oz (63.866 kg)     Other studies Reviewed: Additional studies/ records that were reviewed today include: Dr Tanna Furry notes, hospital records  Assessment and Plan:  1.  Chronic systolic dysfunction euvolemic today Stable on an appropriate medical regimen Normal ICD function See Pace Art report No changes today  2.  Paroxysmal atrial fibrillation He has been maintained on Aggrenox 2/2 intolerance of Coumadin CHADS2VASC is at least 7 Maintaining SR by device interrogation today   He has had a fairly significant functional decline with recent admissions, but is now back at independent living. Briefly discussed turning of tachy therapies on ICD today.  He will discuss further with Dr Jacob Rosales at next office visit.    Current medicines are reviewed at length with the patient today.   The patient does not have concerns regarding his medicines.  The following changes were made today:  none  Labs/ tests ordered today include: none  Disposition:   Follow up with [facility], enroll in [facility] clinic, follow up with Dr Jacob Rosales in 6 months   Signed, Chanetta Marshall, NP 04/12/2015 2:18 PM  [facility] [address] [place] [address] [phone] (office) [phone] (fax)

## 2015-04-12 ENCOUNTER — Encounter: Payer: Self-pay | Admitting: Nurse Practitioner

## 2015-04-12 ENCOUNTER — Ambulatory Visit (INDEPENDENT_AMBULATORY_CARE_PROVIDER_SITE_OTHER): Payer: Medicare Other | Admitting: Nurse Practitioner

## 2015-04-12 VITALS — BP 110/70 | HR 64 | Ht 69.0 in | Wt 151.2 lb

## 2015-04-12 DIAGNOSIS — I480 Paroxysmal atrial fibrillation: Secondary | ICD-10-CM | POA: Diagnosis not present

## 2015-04-12 DIAGNOSIS — I442 Atrioventricular block, complete: Secondary | ICD-10-CM | POA: Diagnosis not present

## 2015-04-12 DIAGNOSIS — I255 Ischemic cardiomyopathy: Secondary | ICD-10-CM

## 2015-04-12 DIAGNOSIS — I5022 Chronic systolic (congestive) heart failure: Secondary | ICD-10-CM

## 2015-04-12 NOTE — Patient Instructions (Addendum)
Medication Instructions:  Your physician recommends that you continue on your current medications as directed. Please refer to the Current Medication list given to you today.  Labwork: None ordered  Testing/Procedures: None ordered  Follow-Up: Your physician wants you to follow-up in: 1 year with Dr. Lovena Le.  You will receive a reminder letter in the mail two months in advance. If you don't receive a letter, please call our office to schedule the follow-up appointment.   Any Other Special Instructions Will Be Listed Below (If Applicable). You are being enrolled in the [facility] clinic.  This will be a remote check from home similar to your device check from home. The date for this check will be: 05/16/15.

## 2015-04-13 LAB — CUP PACEART INCLINIC DEVICE CHECK
Date Time Interrogation Session: 20160811142540
Lead Channel Setting Pacing Amplitude: 2 V
Lead Channel Setting Pacing Amplitude: 2.5 V
Lead Channel Setting Pacing Amplitude: 2.5 V
Lead Channel Setting Pacing Pulse Width: 0.5 ms
MDC IDC SET LEADCHNL LV PACING PULSEWIDTH: 1 ms
MDC IDC SET LEADCHNL RV SENSING SENSITIVITY: 2 mV
MDC IDC SET ZONE DETECTION INTERVAL: 300 ms
Pulse Gen Serial Number: 631860
Zone Setting Detection Interval: 335 ms

## 2015-04-17 ENCOUNTER — Ambulatory Visit (INDEPENDENT_AMBULATORY_CARE_PROVIDER_SITE_OTHER): Payer: Medicare Other | Admitting: Neurology

## 2015-04-17 ENCOUNTER — Encounter: Payer: Self-pay | Admitting: Neurology

## 2015-04-17 VITALS — BP 130/72 | HR 68 | Resp 22 | Ht 69.0 in | Wt 148.0 lb

## 2015-04-17 DIAGNOSIS — G214 Vascular parkinsonism: Secondary | ICD-10-CM

## 2015-04-17 DIAGNOSIS — I255 Ischemic cardiomyopathy: Secondary | ICD-10-CM

## 2015-04-17 DIAGNOSIS — S12100A Unspecified displaced fracture of second cervical vertebra, initial encounter for closed fracture: Secondary | ICD-10-CM

## 2015-04-17 DIAGNOSIS — Z981 Arthrodesis status: Secondary | ICD-10-CM | POA: Diagnosis not present

## 2015-04-17 DIAGNOSIS — F0150 Vascular dementia without behavioral disturbance: Secondary | ICD-10-CM | POA: Diagnosis not present

## 2015-04-17 NOTE — Progress Notes (Signed)
Subjective:    Patient ID: Jacob Rosales is a 81 y.o. male.  HPI     Star Age, MD, PhD [facility] [address] [place], [place] [address]  Dear Dr. Mariea Clonts,   I saw your patient, Jacob Rosales, upon your kind request in my neurologic clinic today for initial consultation for concern for parkinsonism. The patient is accompanied by his wife today. As you know, Jacob Rosales is an 81 year old right-handed gentleman with a complex medical history of cardiac disease including chronic systolic heart failure, ischemic cardiomyopathy, coronary artery disease, with status post CABG (in 2007), complete AV block, prostate cancer, status post radiation treatment, osteoporosis with history of vertebral compression fractures, hypertension, hyperlipidemia, TIA, low back pain, neck pain, skin cancers, A. fib, vitamin D deficiency, cervical fracture, status post cervical fusion in September 2014, status post AICD placement, mixed dementia (on Namenda XR 28 mg), recent hospitalization for sepsis and UTI, as well as CHF exacerbation and possible aspiration, with subsequent inpatient rehabilitation, who was noted to have parkinsonism. I reviewed your office note from 03/21/2015 when he was still in Rehab. He had a head CT without contrast on 12/19/2014:Moderate global atrophy. Chronic ischemic changes in the periventricular white matter and bilateral basal ganglia. There is no mass effect, midline shift, or acute hemorrhage. Mastoid air cells are clear. Cranium is intact. Fusion hardware in the upper cervical spine is noted. In addition, I personally reviewed the images through the PACS system.  He has not fallen recently, but did fall and injured his neck in 2014.   He has a cane and a walker, usually the latter just at night. He has had memory loss for the past few years. He and his wife reside at [facility] in independent living. He completed speech therapy, occupational  therapy and physical therapy. He has had problems with his posture and picking up his feet for the past couple of years or so.  His Past Medical History Is Significant For: Past Medical History  Diagnosis Date  . Complete AV block   . Chronic systolic heart failure     a. s/p STJ CRTD  . Cardiomyopathy, ischemic   . CAD (coronary artery disease) of artery bypass graft   . Prostate cancer     S/P radiation rx  . Osteoporosis     A/P Vertebral compression fx's  . Hypertension   . Dyslipidemia   . TIA (transient ischemic attack)        . Lumbago    . Cervicalgia    . Unspecified vitamin D deficiency    . Basal cell carcinoma of skin of lower limb, including hip    . Squamous cell carcinoma of skin of lower limb, including hip    . Impotence of organic origin    . Insomnia, unspecified    . Paroxysmal atrial fibrillation    . Fracture of C2 vertebra, closed     s/p cervical fusion 05/2013    His Past Surgical History Is Significant For: Past Surgical History  Procedure Laterality Date  . Coronary artery bypass graft    . Hernia repair    . Kyphosis surgery    . Biv-icd  11/21/2010    [employer] ICD Model#CD3231-40 [phone]  . Cardiac defibrillator placement    . Cyst excision  10/2012    back Dr. Wilhemina Bonito  . Cyst removal neck  12/2012    basel cell (right) Dr. Sarajane Jews  . Colonoscopy  2006    polyps benign  . Dexa  April 2010  . Posterior cervical fusion/foraminotomy N/A 05/21/2013    Procedure: Cervical One, Cervical Two, Cervical Three Posterior cervical fusion with lateral mass fixation;  Surgeon: Jacob Pitter, MD;  Location: [place];  Service: Neurosurgery;  Laterality: N/A;  POSTERIOR CERVICAL FUSION/FORAMINOTOMY LEVEL 2    His Family History Is Significant For: Family History  Problem Relation Age of Onset  . Coronary artery disease Neg Hx   . Heart disease Mother     His Social History Is Significant For: Social History   Social History  .  Marital Status: Married    Spouse Name: N/A  . Number of Children: 1  . Years of Education: Masters   Occupational History  . Retired Chief Financial Officer    Social History Main Topics  . Smoking status: Never Smoker   . Smokeless tobacco: Never Used  . Alcohol Use: 1.2 oz/week    1 Glasses of wine, 1 Cans of beer per week  . Drug Use: No  . Sexual Activity: Not Currently   Other Topics Concern  . None   Social History Narrative   Patient is Married Jacob Rosales), retired Chief Financial Officer.    Lives with spouse in single level home at [place] since 2010.   Smoked cigars, and pipe, stopped 1960's,  minimal alcohol history .   Patient has Advanced planning documents:  Living Will, DNR   Walks with cane   Occasional coffee and tea             His Allergies Are:  Allergies  Allergen Reactions  . Lisinopril Other (See Comments)    Acute renal failure  . Warfarin Sodium Other (See Comments)    REACTION: Stomach bleeding  . Amlodipine Besylate Swelling  :   His Current Medications Are:  Outpatient Encounter Prescriptions as of 04/17/2015  Medication Sig  . Calcium Carbonate-Vitamin D (CALCIUM 600 + D PO) Take 1 tablet by mouth daily.   . carvedilol (COREG) 6.25 MG tablet Take one tablet by mouth twice daily with a meal to strengthen the heart  . cholecalciferol (VITAMIN D) 1000 UNITS tablet Take 1,000 Units by mouth daily.   Marland Kitchen dipyridamole-aspirin (AGGRENOX) 200-25 MG per 12 hr capsule Take 1 capsule by mouth 2 (two) times daily.  . furosemide (LASIX) 20 MG tablet Take 2 tablets (40 mg total) by mouth daily.  Marland Kitchen lisinopril (PRINIVIL,ZESTRIL) 20 MG tablet Take 20 mg by mouth daily.  . Melatonin 5 MG CAPS Take 1 capsule (5 mg total) by mouth at bedtime as needed.  . memantine (NAMENDA XR) 28 MG CP24 24 hr capsule Take one tablet daily for memory  . senna-docusate (SENOKOT-S) 8.6-50 MG per tablet Take 2 tablets by mouth daily.  . simvastatin (ZOCOR) 20 MG tablet TAKE 1 TABLET BY  MOUTH AT BEDTIME FOR CHOLESTEROL  . [DISCONTINUED] acetaminophen (TYLENOL) 325 MG tablet Take 2 tablets (650 mg total) by mouth every 6 (six) hours as needed for mild pain (or Fever >/= 101).  . [DISCONTINUED] bisacodyl (DULCOLAX) 5 MG EC tablet Take 1 tablet (5 mg total) by mouth daily as needed for moderate constipation.  . [DISCONTINUED] isosorbide-hydrALAZINE (BIDIL) 20-37.5 MG per tablet Take 1 tablet by mouth 2 (two) times daily.  . [DISCONTINUED] ondansetron (ZOFRAN) 4 MG tablet Take 1 tablet (4 mg total) by mouth every 6 (six) hours as needed for nausea.  . [DISCONTINUED] triamcinolone (KENALOG) 0.025 % cream Apply 1 application topically 2 (  two) times daily as needed (rash).   No facility-administered encounter medications on file as of 04/17/2015.  :   Review of Systems:  Out of a complete 14 point review of systems, all are reviewed and negative with the exception of these symptoms as listed below:   Review of Systems  Constitutional: Positive for fatigue.  HENT: Positive for trouble swallowing.   Respiratory: Positive for cough and shortness of breath.   Genitourinary: Positive for dysuria.       Incontinence   Neurological: Positive for dizziness.       Memory loss, sleepiness, snoring   Hematological: Bruises/bleeds easily.  Psychiatric/Behavioral: Positive for confusion.       Anxiety, not enough sleep, decreased energy, disinterest in activities     Objective:  Neurologic Exam  Physical Exam Physical Examination:   Filed Vitals:   04/17/15 1437  BP: 130/72  Pulse: 68  Resp: 22    General Examination: The patient is a very pleasant 81 y.o. male in no acute distress. He is mildly deconditioned appearing. He is frail appearing. At times he takes a deeper breath and has noisy and slightly labored appearing breathing. He does endorse mild shortness of breath.  HEENT: Normocephalic, atraumatic, pupils are equal, round and reactive to light and accommodation.  Funduscopic exam is normal with sharp disc margins noted. Extraocular tracking shows mild saccadic breakdown without nystagmus noted. There is limitation to upper gaze. He has mild decrease in facial animation. Hearing seems intact. Face is overall symmetric. Neck is rigid. He does have limitation in neck mobility since his neck surgery and since fracturing his cervical spine in September 2014. He has moderate mouth dryness. Mallampati is class II. Tongue protrudes centrally and palate elevates symmetrically.   Chest: is clear to auscultation without wheezing, rhonchi but he has mild bibasilar crackles.  Heart: sounds are somewhat irregular. No murmurs, rubs or gallops are noted.   Abdomen: is soft, non-tender and non-distended with normal bowel sounds appreciated on auscultation.  Extremities: There is trace pitting edema in the distal lower extremities bilaterally. Pedal pulses are intact.   Skin: is warm and dry with no trophic changes noted. Age-related changes are noted on the skin and smaller areas of bruising in his arms and legs, chronic appearing. Of note, he is on Aggrenox.   Musculoskeletal: exam reveals no obvious joint deformities, tenderness, joint swelling or erythema.  Neurologically:  Mental status: The patient is awake and alert, paying good  attention. He is able to partially provide the history. His wife provides details. He is oriented to: person, place, situation, month of year and year. His memory, attention, language and knowledge are impaired. There is no aphasia, agnosia, apraxia or anomia. There is a mild degree of bradyphrenia. Speech is mildly hypophonic with no dysarthria noted. Mood is congruent and affect is normal.  On 04/17/2015: His MMSE score is 23/30. CDT is 2/4. AFT (Animal Fluency Test) score is 7.  he missed 2 points on orientation, 3 points on serial sevens, 1 on remote recall and could not write a full sentence.   Cranial nerves are as described above under  HEENT exam. In addition, shoulder shrug is normal with equal shoulder height noted.  Motor exam: He has a thin bulk and global strength of 4 out of 5. Reflexes are 1-2+ throughout. He has no significant cogwheel rigidity in his extremities but has a hard time relaxing. Fine motor skills are globally mildly impaired. He has no resting tremor, no  postural or action tremor. Finger to nose testing is normal, heel-to-shin very difficult for him. Romberg shows sway. He's not able to do tandem walk. He stands up slowly and walks slowly and cautiously. His neck and upper body are significantly stooped but particularly his neck posture is abnormal. He does not pick up his feet very well but does not have a classic parkinsonian shuffle at this time. He walks with his cane on the right hand. He turns slowly. Sensory exam is intact to light touch and pinprick.   Assessment and Plan:   In summary, SADRAC ZEOLI is a very pleasant 81 y.o.-year old male with a complex medical history of cardiac disease including chronic systolic heart failure, ischemic cardiomyopathy, coronary artery disease, with status post CABG (in 2007), complete AV block, prostate cancer, status post radiation treatment, osteoporosis with history of vertebral compression fractures, hypertension, hyperlipidemia, TIA, low back pain, neck pain, skin cancers, A. fib, vitamin D deficiency, cervical fracture, status post cervical fusion in September 2014, status post AICD placement, mixed dementia (on Namenda XR 28 mg), recent hospitalization for sepsis and UTI, as well as CHF exacerbation and possible aspiration, with subsequent inpatient rehabilitation, who was noted to have parkinsonism.  on examination, he has memory loss, most likely vascular dementia without behavioral disturbance. He has mild bibasilar crackles and report some shortness of breath. He is advised to monitor his symptoms. They do have a nurse on site and his wife in particular is advised  to monitor his shortness of breath. While he may have mild signs and symptoms of parkinsonism, symptoms are mild in that regard. He is at risk for vascular parkinsonism and does not have her history or physical exam in keeping with idiopathic Parkinson's disease. To that end, I suggested that his symptoms be monitored and no new medication be tried at this time. He is advised to continue to hydrate well and uses cane and walker as needed. He is advised to try to pursue healthy nutrition and strengthening exercises. He is advised to stay active physically and mentally as best as he can. For fear of side effects and due to his advanced age and complex medical history I would shy away from adding any new medications at this time. I explained my findings to the patient and his wife. I answered all her questions today and they were in agreement. I can see him back on an as-needed basis.  Thank you very much for allowing me to participate in the care of this nice patient. If I can be of any further assistance to you please do not hesitate to call me at [phone].  Sincerely,   Star Age, MD, PhD

## 2015-04-17 NOTE — Patient Instructions (Signed)
You have memory loss.   You may have mild parkinsonism, but no clearcut evidence of parkinson's disease.   I think overall you are doing fairly well but I do want to suggest a few things today:  Remember to drink plenty of fluid, eat healthy meals and do not skip any meals. Try to eat protein with a every meal and eat a healthy snack such as fruit or nuts in between meals. Try to keep a regular sleep-wake schedule and try to exercise daily, particularly in the form of walking, 10-20 minutes a day, if you can. Good nutrition, proper sleep and exercise can help her cognitive function. Use your cane and walker as needed.   Engage in social activities in your community and with your family and try to keep up with current events by reading the newspaper or watching the news. If you have computer and can go online, try [email or web address]. Also, you may like to do word finding puzzles or crossword puzzles.  As far as your medications are concerned, I would like to suggest no changes.   I can see you back as needed.

## 2015-05-04 ENCOUNTER — Other Ambulatory Visit: Payer: Self-pay | Admitting: Internal Medicine

## 2015-05-05 ENCOUNTER — Encounter: Payer: Self-pay | Admitting: Internal Medicine

## 2015-05-09 ENCOUNTER — Telehealth: Payer: Self-pay | Admitting: Internal Medicine

## 2015-05-09 NOTE — Telephone Encounter (Signed)
Ask him to take 40 bid for 2 days then 40 mg daily. Check bmp in a week. GT

## 2015-05-09 NOTE — Telephone Encounter (Signed)
Pt c/o swelling: STAT is pt has developed SOB within 24 hours  1. How long have you been experiencing swelling? Several days  2. Where is the swelling located? Feet and legs  3.  Are you currently taking a "fluid pill"? Yes   4.  Are you currently SOB? No  5.  Have you traveled recently? No

## 2015-05-11 ENCOUNTER — Telehealth: Payer: Self-pay | Admitting: Internal Medicine

## 2015-05-11 NOTE — Telephone Encounter (Signed)
Follow up   Pt wife is calling upset because she has not  Received a call back yet from the RN   Please give pt wife a call, thank you

## 2015-05-11 NOTE — Telephone Encounter (Signed)
Evans Lance, MD at 05/09/2015 10:37 AM     Status: Signed       Expand All Collapse All   Ask him to take 40 bid for 2 days then 40 mg daily. Check bmp in a week. GT       Wife aware and the order for labs was faxed to [facility]: Ellie Lunch [phone] fax and number is [phone]

## 2015-05-15 ENCOUNTER — Encounter: Payer: Self-pay | Admitting: Internal Medicine

## 2015-05-15 ENCOUNTER — Telehealth: Payer: Self-pay | Admitting: Internal Medicine

## 2015-05-15 MED ORDER — POTASSIUM CHLORIDE CRYS ER 20 MEQ PO TBCR: 20.0000 meq | EXTENDED_RELEASE_TABLET | Freq: Two times a day (BID) | ORAL | Status: DC

## 2015-05-15 NOTE — Telephone Encounter (Signed)
New message    Pt c/o swelling: STAT is pt has developed SOB within 24 hours  1. How long have you been experiencing swelling? 1 week  2. Where is the swelling located? Feet and Legs  3.  Are you currently taking a "fluid pill"?Lasix  4.  Are you currently SOB? No  5.  Have you traveled recently?No  Pt wife states lasix was doubled last week due to the swelling and swelling has not gotten any better

## 2015-05-15 NOTE — Telephone Encounter (Signed)
Advised to increase Lasix to 40 mg BID for 2 more days.  Add Potassium supplement also for 2 days.  Return to normal dosing of 20 mg daily after 40 mg BID is completed.  (Patient has been maintaining well on 20 mg daily until recently).  (Will review Lasix dosage with Dr. Lovena Le next time he is in the office as the patient is taking 20 mg daily and July hospital d/c orders for  Draw BMET next week. Wife verbalized understanding and agreeable to plan.

## 2015-05-15 NOTE — Telephone Encounter (Signed)
Patient's wife reports edema not improved, states it might be "a little worse" but hard to tell.   Reports patient has gained 5 lbs over the last 5 days. Denies any other symptoms/complaints -- no SOB, weakness, fatigue. States that patient took the increased Furosemide as advised on Thursday and Friday.  Currently taking daily dose of 20 mg. Advised will review with DOD and call her with recommendations.

## 2015-05-16 ENCOUNTER — Telehealth: Payer: Self-pay | Admitting: Cardiology

## 2015-05-16 ENCOUNTER — Ambulatory Visit (INDEPENDENT_AMBULATORY_CARE_PROVIDER_SITE_OTHER): Payer: Medicare Other | Admitting: *Deleted

## 2015-05-16 DIAGNOSIS — I255 Ischemic cardiomyopathy: Secondary | ICD-10-CM

## 2015-05-16 NOTE — Telephone Encounter (Signed)
LMOVM reminding pt to send remote transmission.   

## 2015-05-17 ENCOUNTER — Telehealth: Payer: Self-pay | Admitting: Internal Medicine

## 2015-05-17 ENCOUNTER — Encounter: Payer: Self-pay | Admitting: Cardiology

## 2015-05-17 NOTE — Progress Notes (Signed)
Remote ICD transmission.   

## 2015-05-17 NOTE — Telephone Encounter (Signed)
The nurse checked his VS his BP and HR were in normal range will check labs tomorrow

## 2015-05-17 NOTE — Telephone Encounter (Addendum)
He has been taking 40mg  twice daily until 9/11 he took 40mg  9/12 and  Monday called and had to give him 40mg  bid for 2 days and some Potassium.  He has not noticed any changes in swellinga nd for that reason I will have him seen tomorrow here at office at 3:00pm

## 2015-05-17 NOTE — Telephone Encounter (Signed)
Follow up      Pt is coming in on thurs.  Wife said he is so weak that he collapsed across the bed---not fainted.  Do you think he need to have labs drawn tomorrow am stat so that they will be ready for his thurs afternoon appt?

## 2015-05-17 NOTE — Telephone Encounter (Signed)
New message      Pt c/o swelling: STAT is pt has developed SOB within 24 hours  1. How long have you been experiencing swelling?  approx 1 week  2. Where is the swelling located? Feet/legs and ankles still swollen  3.  Are you currently taking a "fluid pill"? Took last klor con today; still on furosemide  4.  Are you currently SOB? no 5.  Have you traveled recently? no

## 2015-05-18 ENCOUNTER — Telehealth: Payer: Self-pay | Admitting: Physician Assistant

## 2015-05-18 ENCOUNTER — Ambulatory Visit (INDEPENDENT_AMBULATORY_CARE_PROVIDER_SITE_OTHER): Payer: Medicare Other | Admitting: Physician Assistant

## 2015-05-18 ENCOUNTER — Encounter: Payer: Self-pay | Admitting: Physician Assistant

## 2015-05-18 ENCOUNTER — Other Ambulatory Visit: Payer: Self-pay | Admitting: Physician Assistant

## 2015-05-18 VITALS — BP 114/68 | HR 76 | Ht 69.0 in | Wt 147.6 lb

## 2015-05-18 DIAGNOSIS — I5022 Chronic systolic (congestive) heart failure: Secondary | ICD-10-CM | POA: Diagnosis not present

## 2015-05-18 DIAGNOSIS — I255 Ischemic cardiomyopathy: Secondary | ICD-10-CM | POA: Diagnosis not present

## 2015-05-18 DIAGNOSIS — I482 Chronic atrial fibrillation, unspecified: Secondary | ICD-10-CM

## 2015-05-18 DIAGNOSIS — Z95810 Presence of automatic (implantable) cardiac defibrillator: Secondary | ICD-10-CM | POA: Diagnosis not present

## 2015-05-18 LAB — BASIC METABOLIC PANEL
BUN: 40 mg/dL — AB (ref 6–23)
CO2: 33 mEq/L — ABNORMAL HIGH (ref 19–32)
CREATININE: 1.85 mg/dL — AB (ref 0.40–1.50)
Calcium: 9.7 mg/dL (ref 8.4–10.5)
Chloride: 102 mEq/L (ref 96–112)
GFR: 37.11 mL/min — ABNORMAL LOW (ref >60.00)
Glucose, Bld: 69 mg/dL — ABNORMAL LOW (ref 70–99)
Potassium: 3.8 mEq/L (ref 3.5–5.1)
Sodium: 143 mEq/L (ref 135–145)

## 2015-05-18 LAB — CBC WITH DIFFERENTIAL/PLATELET
Basophils Absolute: 0 10*3/uL (ref 0.0–0.1)
Basophils Relative: 0.2 % (ref 0.0–3.0)
EOS ABS: 0.1 10*3/uL (ref 0.0–0.7)
Eosinophils Relative: 1.9 % (ref 0.0–5.0)
HCT: 41 % (ref 39.0–52.0)
HEMOGLOBIN: 13.3 g/dL (ref 13.0–17.0)
LYMPHS ABS: 0.6 10*3/uL — AB (ref 0.7–4.0)
Lymphocytes Relative: 11.4 % — ABNORMAL LOW (ref 12.0–46.0)
MCHC: 32.4 g/dL (ref 30.0–36.0)
MCV: 96.7 fl (ref 78.0–100.0)
MONO ABS: 0.4 10*3/uL (ref 0.1–1.0)
Monocytes Relative: 8.4 % (ref 3.0–12.0)
NEUTROS PCT: 78.1 % — AB (ref 43.0–77.0)
Neutro Abs: 4.1 10*3/uL (ref 1.4–7.7)
Platelets: 125 10*3/uL — ABNORMAL LOW (ref 150.0–400.0)
RBC: 4.24 Mil/uL (ref 4.22–5.81)
RDW: 17.1 % — ABNORMAL HIGH (ref 11.5–15.5)
WBC: 5.3 10*3/uL (ref 4.0–10.5)

## 2015-05-18 LAB — BRAIN NATRIURETIC PEPTIDE: PRO B NATRI PEPTIDE: 1844 pg/mL — AB (ref 0.0–100.0)

## 2015-05-18 MED ORDER — SPIRONOLACTONE 25 MG PO TABS: 12.5000 mg | ORAL_TABLET | Freq: Every day | ORAL | Status: DC

## 2015-05-18 NOTE — Addendum Note (Signed)
Addended by: Rosalene Billings on: 05/18/2015 10:01 AM   Modules accepted: Level of Service

## 2015-05-18 NOTE — Progress Notes (Signed)
EPIC Encounter for ICM Monitoring  Patient Name: Jacob Rosales is a 81 y.o. male Date: 05/18/2015 Primary Care Physican: Hollace Kinnier, DO Primary Cardiologist:  Electrophysiologist: Lovena Le Dry Weight: Unknown        In the past month, have you:  1. Gained more than 2 pounds in a day or more than 5 pounds in a week? N/A, no call to patient today due to in office appointment  2. Had changes in your medications (with verification of current medications)?  N/A, no call to patient today due to in office appointment  3. Had more shortness of breath than is usual for you?  N/A, no call to patient today due to in office appointment  4. Limited your activity because of shortness of breath?  N/A, no call to patient today due to in office appointment  5. Not been able to sleep because of shortness of breath?  N/A, no call to patient today due to in office appointment  6. Had increased swelling in your feet or ankles?  N/A, no call to patient today due to in office appointment  7. Had symptoms of dehydration (dizziness, dry mouth, increased thirst, decreased urine output)  N/A, no call to patient today due to in office appointment  8. Had changes in sodium restriction?  N/A, no call to patient today due to in office appointment  9. Been compliant with medication?  N/A, no call to patient today due to in office appointment   ICM trend:   Follow-up plan: ICM clinic phone appointment 05/24/2015 for repeat transmission.   No call to patient today due to he has in office appointment for symptoms of edema, recent weight gain and weakness per previous notes.  CorVue reference baseline and daily impedance have dropped significantly.   Will request a repeat transmit in a week for follow up after today's in office appointment.   Copy of note sent to patient's primary care physician, primary cardiologist, and device following physician.  Rosalene Billings, RN, CCM 05/18/2015 9:35 AM

## 2015-05-18 NOTE — Patient Instructions (Addendum)
Medication Instructions:  Your physician recommends that you continue on your current medications as directed. Please refer to the Current Medication list given to you today.   Labwork: TODAY:  BMET & BNP & CBC W/DIFF   Testing/Procedures: None Ordered   Follow-Up: Follow up with Dr. Lovena Le as previously scheduled.

## 2015-05-18 NOTE — Telephone Encounter (Signed)
'     Patient's lab work show worsening renal function after increased lasix from 40mg  qd to BID for the last 2-3 days. BNP elevated at 1844 despite increased diuretics.  I spoke with the patient's wife and told her to go back to go back to 40mg  Lasix qd. I called in spironolactone 12.5 mg qd as he is NYHA class II with EF <30%. We will repeat a BMET next week. I will have an order faxed to [facility] so they can get it done at their own facility next Tuesday.   Angelena Form PA-C  [facility]

## 2015-05-18 NOTE — Progress Notes (Signed)
Cardiology Office Note    Date:  05/18/2015   ID:  Jacob Rosales, DOB 08-13-1928, MRN [id number]  PCP:  Hollace Kinnier, DO  Cardiologist/Electrophysiologist:  Dr Lovena Le   History of Present Illness: Jacob Rosales is a 81 y.o. male HTN, HLD, TIAs, PAF on aggrenox, chronic systolic CHF s/p BIV ICD, CAD s/p CABG, and CHB s/p PPM who was added on to my schedule for evaluation of LE edema.   Patient was admitted to [facility] from 03/10/15-03/13/15 for UTI, PNA and acute on chronic CHF. At discharge, he went to assisted living for a short time but is now back at independent living at [facility]. He was discharged on 40mg  Lasix po qd and had been doing well until recently.  He has chronic dyspnea on exertion and is uses O2 prn at home.  Phone notes reveal that patient had gained 5 lbs in less than a week and was having worsening LE edema. His lasix was increased to 40mg  BID and a potassium supplement was called in. He then called back because his sx did not improve and he was added on to my schedule for further evaluation.  Per patient's wife, he has been very fatigued lately and just doesn't have his "usual spark." She is worried about his swelling because of his recent admissions for CHF. The patient denies PND or orthopnea and doesn't feel more SOB than usual. He sleeps on one pillow at night, which is unchanged. No CP. He denies recent dietary/fluid indiscretion. Patient provides very few details but wife happy to fill in the details.    His weight at his last visit with Chanetta Marshall NP on 8/10/116 was 151lbs. His weight today is actually below this at 147.6.  Studies:  - Echo (12/19/2014):  EF 25%, apex moves a little better than other walls. Moderately dilated. Diffuse HK. Mild AR, mild aortic root dilation, mod MR, moderately dilated LA, mildly dilated RV, mildly reduced RV systolic function, PA pressure 52.    Recent Labs/Images:   Recent Labs  06/16/14 1440 07/05/14  12/21/14 0736   03/10/15 1807  03/21/15  NA 139 141  < > 144  < > 138  < > 136*  K 3.9 3.8  < > 3.7  < > 4.3  < > 4.1  BUN 26* 28*  < > 35*  < > 35*  < > 31*  CREATININE 1.5 1.4*  < > 1.47*  < > 1.66*  < > 1.2  ALT  --   --   < > 199*  < > 51  --   --   HGB  --   --   < >  --   --  13.5  < > 12.8*  LDLCALC  --  63  --   --   --   --   --   --   HDL  --  49  --   --   --   --   --   --   BNP  --   --   < > 2091.6*  --   --   --   --   PROBNP 982.0*  --   --   --   --   --   --   --   < > = values in this interval not displayed.   Dg Chest 2 View  03/20/2015   CLINICAL DATA:  Shortness of breath, history of CABG and remote history of  tobacco use  EXAM: CHEST  2 VIEW  COMPARISON:  PA and lateral chest x-ray of March 11, 2015  FINDINGS: The lungs are well-expanded. The small right pleural effusion has nearly totally resolved. The pulmonary interstitial markings are less prominent. The central pulmonary vascularity remains prominent but cephalization has decreased. The cardiac silhouette remains enlarged. There is stable post CABG and AICD placement changes. There are chronic wedge compressions of several thoracic vertebral bodies with evidence of previous kyphoplasty at 1 level.  IMPRESSION: Improving CHF with near total resolution of the small right pleural effusion.   Electronically Signed   By: David  Martinique M.D.   On: 03/20/2015 15:47     Wt Readings from Last 3 Encounters:  05/18/15 147 lb 9.6 oz (66.951 kg)  04/17/15 148 lb (67.132 kg)  04/12/15 151 lb 3.2 oz (68.584 kg)     Past Medical History  Diagnosis Date  . Complete AV block   . Chronic systolic heart failure     a. s/p STJ CRTD  . Cardiomyopathy, ischemic   . CAD (coronary artery disease) of artery bypass graft   . Prostate cancer     S/P radiation rx  . Osteoporosis     A/P Vertebral compression fx's  . Hypertension   . Dyslipidemia   . TIA (transient ischemic attack)        . Lumbago    . Cervicalgia    . Unspecified vitamin D  deficiency    . Basal cell carcinoma of skin of lower limb, including hip    . Squamous cell carcinoma of skin of lower limb, including hip    . Impotence of organic origin    . Insomnia, unspecified    . Paroxysmal atrial fibrillation    . Fracture of C2 vertebra, closed     s/p cervical fusion 05/2013    Current Outpatient Prescriptions  Medication Sig Dispense Refill  . Calcium Carbonate-Vitamin D (CALCIUM 600 + D PO) Take 1 tablet by mouth daily.     . carvedilol (COREG) 6.25 MG tablet Take one tablet by mouth twice daily with a meal to strengthen the heart 180 tablet 1  . cholecalciferol (VITAMIN D) 1000 UNITS tablet Take 1,000 Units by mouth daily.     Marland Kitchen dipyridamole-aspirin (AGGRENOX) 200-25 MG per 12 hr capsule Take 1 capsule by mouth 2 (two) times daily.    . furosemide (LASIX) 20 MG tablet Take 2 tablets (40 mg total) by mouth daily. 30 tablet   . lisinopril (PRINIVIL,ZESTRIL) 20 MG tablet TAKE 1 TABLET BY MOUTH ONCE DAILY TO CONTROL BLOOD PRESSURE 90 tablet 1  . Melatonin 5 MG CAPS Take 1 capsule (5 mg total) by mouth at bedtime as needed. 30 capsule 0  . memantine (NAMENDA XR) 28 MG CP24 24 hr capsule Take 28 mg by mouth daily.    . potassium chloride SA (KLOR-CON M20) 20 MEQ tablet Take 1 tablet (20 mEq total) by mouth 2 (two) times daily. For 2 days, while taking the increased Lasix 4 tablet 0  . senna-docusate (SENOKOT-S) 8.6-50 MG per tablet Take 2 tablets by mouth daily.    . simvastatin (ZOCOR) 20 MG tablet TAKE 1 TABLET BY MOUTH AT BEDTIME FOR CHOLESTEROL 90 tablet 1   No current facility-administered medications for this visit.     Allergies:   Lisinopril; Warfarin sodium; and Amlodipine besylate   Social History:  The patient  reports that he has never smoked. He has never used smokeless tobacco. He  reports that he drinks about 1.2 oz of alcohol per week. He reports that he does not use illicit drugs.   Family History:  The patient's family history includes Heart  disease in his mother. There is no history of Coronary artery disease.   ROS:  Please see the history of present illness.   All other systems reviewed and negative.    PHYSICAL EXAM: VS:  BP 114/68 mmHg  Pulse 76  Ht 5\' 9"  (1.753 m)  Wt 147 lb 9.6 oz (66.951 kg)  BMI 21.79 kg/m2  SpO2 99% Well nourished, well developed, in no acute distress HEENT: normal Neck: no JVD Cardiac:  normal S1, S2; RRR; no murmur Lungs:  clear to auscultation bilaterally, no wheezing, rhonchi or rales Abd: soft, nontender, no hepatomegaly Ext: 1+ bilateral LE edema.  Skin: warm and dry Neuro:  CNs 2-12 intact, no focal abnormalities noted  EKG:  None.    ASSESSMENT AND PLAN:  Jacob Rosales is a 81 y.o. male HTN, HLD, TIAs, PAF on aggrenox, chronic systolic CHF s/p BIV ICD, CAD s/p CABG, and CHB s/p PPM who was added on to my schedule for evaluation of LE edema and fatigue.   Fatigue- will check a CBC  LE edema- he does have some mild LE edema. Have counseled him on elevating legs and wrote a Rx for compression stockings. He will continue to take 40mg  Lasix BID until instructed to do differently. I have ordered a BMET and BNP to evaluate for volume overload and kidney function. Will further adjust lasix and Kdur depending on these results.    Ischemic CM -s/p BiV ICD- interrogation next week on 05/24/15 -- He has chronic DOE and now with some worsening LE edema. No orthopnea or PND. He does appear mildly short of breath but lungs sound clear to me.  His weight at his last visit with Chanetta Marshall NP on 8/10/116 was 151lbs. His weight today is actually below this at 147.6.  Paroxysmal atrial fibrillation -- CHADS2VASC is at least 7. He has been maintained on Aggrenox 2/2 intolerance of Coumadin per notes by Chanetta Marshall NP -- Maintaining SR by device interrogation 04/11/15. Device is scheduled to be interrogated 05/24/15  Disposition:   FU w/ Dr. Lovena Le.    Signed, Vesta Mixer, PA-C, [facility] 05/18/2015  3:35 PM    [facility] [address], [place], [place]  [address] Phone: [phone]; Fax: [phone]

## 2015-05-21 ENCOUNTER — Emergency Department (HOSPITAL_COMMUNITY): Payer: Medicare Other

## 2015-05-21 ENCOUNTER — Inpatient Hospital Stay (HOSPITAL_COMMUNITY)
Admission: EM | Admit: 2015-05-21 | Discharge: 2015-05-23 | DRG: 292 | Disposition: A | Discharge status: Skilled Nursing Facility | Payer: Medicare Other | Attending: Internal Medicine | Admitting: Internal Medicine

## 2015-05-21 ENCOUNTER — Encounter (HOSPITAL_COMMUNITY): Payer: Self-pay

## 2015-05-21 DIAGNOSIS — E785 Hyperlipidemia, unspecified: Secondary | ICD-10-CM | POA: Diagnosis present

## 2015-05-21 DIAGNOSIS — I5022 Chronic systolic (congestive) heart failure: Secondary | ICD-10-CM | POA: Diagnosis present

## 2015-05-21 DIAGNOSIS — I480 Paroxysmal atrial fibrillation: Secondary | ICD-10-CM | POA: Diagnosis present

## 2015-05-21 DIAGNOSIS — Z923 Personal history of irradiation: Secondary | ICD-10-CM | POA: Diagnosis not present

## 2015-05-21 DIAGNOSIS — Z8673 Personal history of transient ischemic attack (TIA), and cerebral infarction without residual deficits: Secondary | ICD-10-CM

## 2015-05-21 DIAGNOSIS — R4189 Other symptoms and signs involving cognitive functions and awareness: Secondary | ICD-10-CM | POA: Diagnosis present

## 2015-05-21 DIAGNOSIS — Z8546 Personal history of malignant neoplasm of prostate: Secondary | ICD-10-CM

## 2015-05-21 DIAGNOSIS — Z6821 Body mass index (BMI) 21.0-21.9, adult: Secondary | ICD-10-CM

## 2015-05-21 DIAGNOSIS — Z981 Arthrodesis status: Secondary | ICD-10-CM

## 2015-05-21 DIAGNOSIS — I509 Heart failure, unspecified: Secondary | ICD-10-CM

## 2015-05-21 DIAGNOSIS — R64 Cachexia: Secondary | ICD-10-CM | POA: Diagnosis present

## 2015-05-21 DIAGNOSIS — I10 Essential (primary) hypertension: Secondary | ICD-10-CM | POA: Diagnosis present

## 2015-05-21 DIAGNOSIS — Z8249 Family history of ischemic heart disease and other diseases of the circulatory system: Secondary | ICD-10-CM | POA: Diagnosis not present

## 2015-05-21 DIAGNOSIS — I25810 Atherosclerosis of coronary artery bypass graft(s) without angina pectoris: Secondary | ICD-10-CM | POA: Diagnosis not present

## 2015-05-21 DIAGNOSIS — Z66 Do not resuscitate: Secondary | ICD-10-CM | POA: Diagnosis present

## 2015-05-21 DIAGNOSIS — I5033 Acute on chronic diastolic (congestive) heart failure: Secondary | ICD-10-CM

## 2015-05-21 DIAGNOSIS — I2510 Atherosclerotic heart disease of native coronary artery without angina pectoris: Secondary | ICD-10-CM | POA: Diagnosis present

## 2015-05-21 DIAGNOSIS — Z85828 Personal history of other malignant neoplasm of skin: Secondary | ICD-10-CM

## 2015-05-21 DIAGNOSIS — R072 Precordial pain: Secondary | ICD-10-CM | POA: Diagnosis not present

## 2015-05-21 DIAGNOSIS — Z951 Presence of aortocoronary bypass graft: Secondary | ICD-10-CM | POA: Diagnosis not present

## 2015-05-21 DIAGNOSIS — M810 Age-related osteoporosis without current pathological fracture: Secondary | ICD-10-CM | POA: Diagnosis present

## 2015-05-21 DIAGNOSIS — Z95810 Presence of automatic (implantable) cardiac defibrillator: Secondary | ICD-10-CM | POA: Diagnosis not present

## 2015-05-21 DIAGNOSIS — G4700 Insomnia, unspecified: Secondary | ICD-10-CM | POA: Diagnosis present

## 2015-05-21 DIAGNOSIS — I255 Ischemic cardiomyopathy: Secondary | ICD-10-CM | POA: Diagnosis present

## 2015-05-21 DIAGNOSIS — I5023 Acute on chronic systolic (congestive) heart failure: Principal | ICD-10-CM | POA: Diagnosis present

## 2015-05-21 HISTORY — DX: Atherosclerotic heart disease of native coronary artery without angina pectoris: I25.10

## 2015-05-21 LAB — CBC
HCT: 36.8 % — ABNORMAL LOW (ref 39.0–52.0)
Hemoglobin: 11.7 g/dL — ABNORMAL LOW (ref 13.0–17.0)
MCH: 30.8 pg (ref 26.0–34.0)
MCHC: 31.8 g/dL (ref 30.0–36.0)
MCV: 96.8 fL (ref 78.0–100.0)
PLATELETS: 113 10*3/uL — AB (ref 150–400)
RBC: 3.8 MIL/uL — ABNORMAL LOW (ref 4.22–5.81)
RDW: 16.5 % — ABNORMAL HIGH (ref 11.5–15.5)
WBC: 5.2 10*3/uL (ref 4.0–10.5)

## 2015-05-21 LAB — TROPONIN I
TROPONIN I: 0.06 ng/mL — AB (ref <0.031)
Troponin I: 0.06 ng/mL — ABNORMAL HIGH (ref <0.031)
Troponin I: 0.05 ng/mL — ABNORMAL HIGH (ref <0.031)

## 2015-05-21 LAB — URINALYSIS, ROUTINE W REFLEX MICROSCOPIC
Bilirubin Urine: NEGATIVE
GLUCOSE, UA: NEGATIVE mg/dL
HGB URINE DIPSTICK: NEGATIVE
Ketones, ur: NEGATIVE mg/dL
Leukocytes, UA: NEGATIVE
Nitrite: NEGATIVE
Protein, ur: 30 mg/dL — AB
SPECIFIC GRAVITY, URINE: 1.012 (ref 1.005–1.030)
UROBILINOGEN UA: 0.2 mg/dL (ref 0.0–1.0)
pH: 6.5 (ref 5.0–8.0)

## 2015-05-21 LAB — BASIC METABOLIC PANEL
Anion gap: 9 (ref 5–15)
BUN: 34 mg/dL — ABNORMAL HIGH (ref 6–20)
CO2: 26 mmol/L (ref 22–32)
Calcium: 9 mg/dL (ref 8.9–10.3)
Chloride: 103 mmol/L (ref 101–111)
Creatinine, Ser: 1.67 mg/dL — ABNORMAL HIGH (ref 0.61–1.24)
GFR calc Af Amer: 41 mL/min — ABNORMAL LOW (ref >60)
GFR calc non Af Amer: 36 mL/min — ABNORMAL LOW (ref >60)
GLUCOSE: 109 mg/dL — AB (ref 65–99)
Potassium: 3.8 mmol/L (ref 3.5–5.1)
Sodium: 138 mmol/L (ref 135–145)

## 2015-05-21 LAB — PROTIME-INR
INR: 1.3 (ref 0.00–1.49)
PROTHROMBIN TIME: 16.3 s — AB (ref 11.6–15.2)

## 2015-05-21 LAB — BRAIN NATRIURETIC PEPTIDE: B Natriuretic Peptide: 880.9 pg/mL — ABNORMAL HIGH (ref 0.0–100.0)

## 2015-05-21 LAB — I-STAT TROPONIN, ED: TROPONIN I, POC: 0.02 ng/mL (ref 0.00–0.08)

## 2015-05-21 LAB — URINE MICROSCOPIC-ADD ON

## 2015-05-21 MED ORDER — ENOXAPARIN SODIUM 30 MG/0.3ML ~~LOC~~ SOLN
30.0000 mg | SUBCUTANEOUS | Status: DC
  Administered 2015-05-21 – 2015-05-22 (×2): 30 mg via SUBCUTANEOUS
  Filled 2015-05-21: qty 0.3

## 2015-05-21 MED ORDER — FUROSEMIDE 10 MG/ML IJ SOLN
40.0000 mg | Freq: Once | INTRAMUSCULAR | Status: AC
  Administered 2015-05-21: 40 mg via INTRAVENOUS
  Filled 2015-05-21: qty 4

## 2015-05-21 MED ORDER — SIMVASTATIN 20 MG PO TABS
20.0000 mg | ORAL_TABLET | Freq: Every day | ORAL | Status: DC
  Administered 2015-05-21 – 2015-05-22 (×2): 20 mg via ORAL
  Filled 2015-05-21 (×2): qty 1

## 2015-05-21 MED ORDER — ASPIRIN-DIPYRIDAMOLE ER 25-200 MG PO CP12
1.0000 | ORAL_CAPSULE | Freq: Two times a day (BID) | ORAL | Status: DC
  Administered 2015-05-21 – 2015-05-23 (×4): 1 via ORAL
  Filled 2015-05-21 (×7): qty 1

## 2015-05-21 MED ORDER — SPIRONOLACTONE 25 MG PO TABS
12.5000 mg | ORAL_TABLET | Freq: Every day | ORAL | Status: DC
  Administered 2015-05-22 – 2015-05-23 (×2): 12.5 mg via ORAL
  Filled 2015-05-21 (×2): qty 1

## 2015-05-21 MED ORDER — MEMANTINE HCL ER 28 MG PO CP24
28.0000 mg | ORAL_CAPSULE | Freq: Every day | ORAL | Status: DC
  Administered 2015-05-22 – 2015-05-23 (×2): 28 mg via ORAL
  Filled 2015-05-21 (×3): qty 1

## 2015-05-21 MED ORDER — SENNOSIDES-DOCUSATE SODIUM 8.6-50 MG PO TABS
2.0000 | ORAL_TABLET | Freq: Every day | ORAL | Status: DC
  Administered 2015-05-22 – 2015-05-23 (×2): 2 via ORAL
  Filled 2015-05-21 (×2): qty 2

## 2015-05-21 MED ORDER — CARVEDILOL 6.25 MG PO TABS
6.2500 mg | ORAL_TABLET | Freq: Two times a day (BID) | ORAL | Status: DC
  Administered 2015-05-21 – 2015-05-23 (×4): 6.25 mg via ORAL
  Filled 2015-05-21 (×4): qty 1

## 2015-05-21 MED ORDER — SODIUM CHLORIDE 0.9 % IJ SOLN
3.0000 mL | Freq: Two times a day (BID) | INTRAMUSCULAR | Status: DC
  Administered 2015-05-21 – 2015-05-23 (×4): 3 mL via INTRAVENOUS

## 2015-05-21 MED ORDER — FUROSEMIDE 10 MG/ML IJ SOLN
40.0000 mg | Freq: Every day | INTRAMUSCULAR | Status: DC
  Administered 2015-05-22 – 2015-05-23 (×2): 40 mg via INTRAVENOUS
  Filled 2015-05-21 (×2): qty 4

## 2015-05-21 NOTE — ED Notes (Signed)
Heart healthy meal tray ordered for pt.  

## 2015-05-21 NOTE — ED Notes (Signed)
Per EMS - pt family reports that pt is confused and has cp. Pt denies any pain to EMS and to this RN. Pt is alert and oriented x 4. Extensive cardiac hx. Pt has pacemaker. 12-lead paced rhythm. Pt from [facility] facility (stays on independent side).
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
## 2015-05-21 NOTE — ED Provider Notes (Signed)
CSN: [id number]     Arrival date & time 05/21/15  0746 History   First MD Initiated Contact with Patient 05/21/15 [phone]     Chief Complaint  Patient presents with  . Altered Mental Status  . Chest Pain     (Consider location/radiation/quality/duration/timing/severity/associated sxs/prior Treatment) Patient is a 81 y.o. male presenting with chest pain. The history is provided by the patient.  Chest Pain Pain location:  L chest Pain quality: aching and sharp   Pain radiates to:  Does not radiate Pain radiates to the back: no   Pain severity:  Mild Onset quality:  Sudden Duration:  3 minutes Chronicity:  New Context: not breathing, not lifting and no movement   Relieved by:  None tried Worsened by:  Nothing tried Ineffective treatments:  None tried Associated symptoms: shortness of breath   Associated symptoms: no abdominal pain, no AICD problem, no fever and no heartburn     Past Medical History  Diagnosis Date  . Complete AV block   . Chronic systolic heart failure     a. s/p STJ CRTD  . Cardiomyopathy, ischemic   . CAD (coronary artery disease) of artery bypass graft   . Prostate cancer     S/P radiation rx  . Osteoporosis     A/P Vertebral compression fx's  . Hypertension   . Dyslipidemia   . TIA (transient ischemic attack)        . Lumbago    . Cervicalgia    . Unspecified vitamin D deficiency    . Basal cell carcinoma of skin of lower limb, including hip    . Squamous cell carcinoma of skin of lower limb, including hip    . Impotence of organic origin    . Insomnia, unspecified    . Paroxysmal atrial fibrillation    . Fracture of C2 vertebra, closed     s/p cervical fusion 05/2013   Past Surgical History  Procedure Laterality Date  . Coronary artery bypass graft    . Hernia repair    . Kyphosis surgery    . Biv-icd  11/21/2010    [employer] ICD Model#CD3231-40 [phone]  . Cardiac defibrillator placement    . Cyst excision  10/2012    back Dr.  Wilhemina Bonito  . Cyst removal neck  12/2012    basel cell (right) Dr. Sarajane Jews  . Colonoscopy  2006    polyps benign  . Dexa  April 2010  . Posterior cervical fusion/foraminotomy N/A 05/21/2013    Procedure: Cervical One, Cervical Two, Cervical Three Posterior cervical fusion with lateral mass fixation;  Surgeon: Charlie Pitter, MD;  Location: [place];  Service: Neurosurgery;  Laterality: N/A;  POSTERIOR CERVICAL FUSION/FORAMINOTOMY LEVEL 2   Family History  Problem Relation Age of Onset  . Coronary artery disease Neg Hx   . Heart disease Mother    Social History  Substance Use Topics  . Smoking status: Never Smoker   . Smokeless tobacco: Never Used  . Alcohol Use: 1.2 oz/week    1 Glasses of wine, 1 Cans of beer per week    Review of Systems  Constitutional: Negative for fever and chills.  HENT: Negative for congestion.   Respiratory: Positive for shortness of breath. Negative for choking and chest tightness.   Cardiovascular: Positive for chest pain and leg swelling.  Gastrointestinal: Negative for heartburn and abdominal pain.  Genitourinary: Negative for dysuria.  All other systems reviewed and are negative.  Allergies  Lisinopril; Warfarin sodium; and Amlodipine besylate  Home Medications   Prior to Admission medications   Medication Sig Start Date End Date Taking? Authorizing Provider  Calcium Carbonate-Vitamin D (CALCIUM 600 + D PO) Take 1 tablet by mouth daily.     Historical Provider, MD  carvedilol (COREG) 6.25 MG tablet Take one tablet by mouth twice daily with a meal to strengthen the heart 03/27/15   Tiffany L Reed, DO  cholecalciferol (VITAMIN D) 1000 UNITS tablet Take 1,000 Units by mouth daily.     Historical Provider, MD  dipyridamole-aspirin (AGGRENOX) 200-25 MG per 12 hr capsule Take 1 capsule by mouth 2 (two) times daily. 03/13/15   Silver Huguenin Elgergawy, MD  furosemide (LASIX) 20 MG tablet Take 2 tablets (40 mg total) by mouth daily. 03/14/15   Silver Huguenin Elgergawy,  MD  lisinopril (PRINIVIL,ZESTRIL) 20 MG tablet TAKE 1 TABLET BY MOUTH ONCE DAILY TO CONTROL BLOOD PRESSURE 05/04/15   Tiffany L Reed, DO  Melatonin 5 MG CAPS Take 1 capsule (5 mg total) by mouth at bedtime as needed. 12/27/14   Tiffany L Reed, DO  memantine (NAMENDA XR) 28 MG CP24 24 hr capsule Take 28 mg by mouth daily.    Historical Provider, MD  potassium chloride SA (KLOR-CON M20) 20 MEQ tablet Take 1 tablet (20 mEq total) by mouth 2 (two) times daily. For 2 days, while taking the increased Lasix 05/15/15   Sherren Mocha, MD  senna-docusate (SENOKOT-S) 8.6-50 MG per tablet Take 2 tablets by mouth daily. 05/24/13   Mardene Celeste, NP  simvastatin (ZOCOR) 20 MG tablet TAKE 1 TABLET BY MOUTH AT BEDTIME FOR CHOLESTEROL 12/01/14   Tiffany L Reed, DO  spironolactone (ALDACTONE) 25 MG tablet Take 0.5 tablets (12.5 mg total) by mouth daily. 05/18/15   Eileen Stanford, PA-C   BP 116/91 mmHg  Pulse 76  Temp(Src) 97.7 F (36.5 C) (Oral)  Resp 12  SpO2 97% Physical Exam  Constitutional: He is oriented to person, place, and time. He appears well-developed and well-nourished.  HENT:  Head: Normocephalic and atraumatic.  Eyes: Conjunctivae and EOM are normal.  Neck: Normal range of motion. Neck supple.  Cardiovascular: Normal rate and regular rhythm.   Pulmonary/Chest: Effort normal. Tachypnea noted. No respiratory distress. He has rales (fine in bases).  Abdominal: Soft. There is no tenderness.  Musculoskeletal: Normal range of motion. He exhibits no edema or tenderness.  Neurological: He is alert and oriented to person, place, and time.  Skin: Skin is warm and dry.  Nursing note and vitals reviewed.   ED Course  Procedures (including critical care time) Labs Review Labs Reviewed  BASIC METABOLIC PANEL  CBC  PROTIME-INR  BRAIN NATRIURETIC PEPTIDE  TROPONIN I  I-STAT [place], ED    Imaging Review No results found. I have personally reviewed and evaluated these images and lab  results as part of my medical decision-making.   EKG Interpretation None      MDM   Final diagnoses:  None   Patient is a poor historian. From what I can tell he had chest pain around 7:00 this morning and the left side of his chest near his pacemaker. Radiate to his left arm. Has been having shortness of breath for the last couple weeks was seen by his cardiologist a few days ago. Still complains of shortness of breath and on exam can't speak in full sentences secondary to dyspnea. Has some rales in his bases mild lower extremity swelling. Concern  for likely CHF exacerbation. Labs and imaging c/w acute on chronic CHF will admit.     Merrily Pew, MD 05/21/15 [phone]

## 2015-05-22 ENCOUNTER — Telehealth: Payer: Self-pay | Admitting: Internal Medicine

## 2015-05-22 DIAGNOSIS — I10 Essential (primary) hypertension: Secondary | ICD-10-CM

## 2015-05-22 DIAGNOSIS — Z95810 Presence of automatic (implantable) cardiac defibrillator: Secondary | ICD-10-CM

## 2015-05-22 DIAGNOSIS — E785 Hyperlipidemia, unspecified: Secondary | ICD-10-CM

## 2015-05-22 DIAGNOSIS — R4189 Other symptoms and signs involving cognitive functions and awareness: Secondary | ICD-10-CM

## 2015-05-22 DIAGNOSIS — I5023 Acute on chronic systolic (congestive) heart failure: Principal | ICD-10-CM

## 2015-05-22 DIAGNOSIS — R072 Precordial pain: Secondary | ICD-10-CM

## 2015-05-22 DIAGNOSIS — I25810 Atherosclerosis of coronary artery bypass graft(s) without angina pectoris: Secondary | ICD-10-CM

## 2015-05-22 LAB — TROPONIN I
TROPONIN I: 0.07 ng/mL — AB (ref <0.031)
Troponin I: 0.07 ng/mL — ABNORMAL HIGH (ref <0.031)
Troponin I: 0.07 ng/mL — ABNORMAL HIGH (ref <0.031)
Troponin I: 0.08 ng/mL — ABNORMAL HIGH (ref <0.031)

## 2015-05-22 LAB — BASIC METABOLIC PANEL
Anion gap: 9 (ref 5–15)
BUN: 35 mg/dL — AB (ref 6–20)
CHLORIDE: 101 mmol/L (ref 101–111)
CO2: 30 mmol/L (ref 22–32)
CREATININE: 1.91 mg/dL — AB (ref 0.61–1.24)
Calcium: 9.1 mg/dL (ref 8.9–10.3)
GFR calc Af Amer: 35 mL/min — ABNORMAL LOW (ref >60)
GFR calc non Af Amer: 30 mL/min — ABNORMAL LOW (ref >60)
Glucose, Bld: 109 mg/dL — ABNORMAL HIGH (ref 65–99)
Potassium: 4 mmol/L (ref 3.5–5.1)
SODIUM: 140 mmol/L (ref 135–145)

## 2015-05-22 MED ORDER — ZOLPIDEM TARTRATE 5 MG PO TABS: 5.0000 mg | ORAL_TABLET | Freq: Once | ORAL | Status: DC

## 2015-05-22 NOTE — Evaluation (Signed)
Occupational Therapy Evaluation Patient Details Name: Jacob Rosales MRN: [id number] DOB: 21-Mar-1928 Today's Date: 05/22/2015    History of Present Illness Pt is an 81 y.o. male admitted from [facility] due to CHF and AMS. PMH of chronic systolic HF with ICD/PCM, osteoporosis, HTN, CAD, insomnia    Clinical Impression   Pt admitted with above. Unsure of pt's true PLOF. Feel pt will benefit from acute OT to increase independence prior to d/c. Recommending SNF prior to d/c home.    Follow Up Recommendations  SNF;Supervision/Assistance - 24 hour    Equipment Recommendations  Other (comment) (defer to next venue)    Recommendations for Other Services       Precautions / Restrictions Precautions Precautions: Fall Precaution Comments: incontinent Restrictions Weight Bearing Restrictions: No      Mobility Bed Mobility Overal bed mobility: Needs Assistance Bed Mobility: Supine to Sit;Sit to Supine     Supine to sit: Max assist Sit to supine: Min assist   General bed mobility comments: assist to help scoot HOB and trendelenburg position used. Assist with trunk and legs to get to EOB.  Transfers Overall transfer level: Needs assistance Equipment used: Rolling walker (2 wheeled) Transfers: Sit to/from Stand Sit to Stand: Mod assist         General transfer comment: assist to boost and for balance.    Balance Assist with balance at times sitting EOB.                     ADL Overall ADL's : Needs assistance/impaired     Grooming: Wash/dry face;Moderate assistance;Sitting Grooming Details (indicate cue type and reason): assist with balance             Lower Body Dressing: Maximal assistance;Total assistance;Sit to/from stand   Toilet Transfer: Moderate assistance;Ambulation;Minimal assistance;RW (Mod assist for sit to stand)           Functional mobility during ADLs: Minimal assistance;Rolling walker General ADL Comments: Pt  sat EOB and washed off face with assist for balance.     Vision     Perception     Praxis      Pertinent Vitals/Pain Pain Assessment: No/denies pain     Hand Dominance     Extremity/Trunk Assessment Upper Extremity Assessment Upper Extremity Assessment: Generalized weakness   Lower Extremity Assessment Lower Extremity Assessment: Defer to PT evaluation       Communication     Cognition Arousal/Alertness: Awake/alert Behavior During Therapy: WFL for tasks assessed/performed Overall Cognitive Status: No family/caregiver present to determine baseline cognitive functioning                     General Comments       Exercises       Shoulder Instructions      Home Living Family/patient expects to be discharged to:: Unsure Living Arrangements: Spouse/significant other Available Help at Discharge: Family;Available 24 hours/day Type of Home: Independent living facility Home Access: Level entry     Home Layout: One level               Home Equipment: Walker - 2 wheels          Prior Functioning/Environment Level of Independence: Needs assistance  Gait / Transfers Assistance Needed: per pt walking with a RW independently ADL's / Homemaking Assistance Needed: wife assists with bathing and dressing per pt   Comments: pt not reliable historian and no family present    OT  Diagnosis: Generalized weakness;Altered mental status   OT Problem List: Decreased strength;Decreased activity tolerance;Impaired balance (sitting and/or standing);Decreased cognition;Decreased knowledge of use of DME or AE;Decreased knowledge of precautions   OT Treatment/Interventions: Self-care/ADL training;DME and/or AE instruction;Therapeutic activities;Cognitive remediation/compensation;Patient/family education;Balance training    OT Goals(Current goals can be found in the care plan section) Acute Rehab OT Goals Patient Stated Goal: not stated OT Goal Formulation: Patient  unable to participate in goal setting Time For Goal Achievement: 05/29/15 Potential to Achieve Goals: Good ADL Goals Pt Will Perform Grooming: with set-up;sitting;with supervision (unsupported) Pt Will Perform Upper Body Dressing: with set-up;with supervision;sitting (unsupported) Pt Will Transfer to Toilet: with min guard assist;ambulating Pt Will Perform Toileting - Clothing Manipulation and hygiene: with min guard assist;sit to/from stand  OT Frequency: Min 2X/week   Barriers to D/C:            Co-evaluation              End of Session Equipment Utilized During Treatment: Gait belt;Rolling walker Nurse Communication: Mobility status  Activity Tolerance: Patient limited by fatigue;Other (comment) (reported dizziness?) Patient left: in bed;with call bell/phone within reach;with bed alarm set   Time: 8916-9450 OT Time Calculation (min): 10 min Charges:  OT General Charges $OT Visit: 1 Procedure OT Evaluation $Initial OT Evaluation Tier I: 1 Procedure G-CodesBenito Rosales OTR/L [phone] 05/22/2015, 3:35 PM

## 2015-05-22 NOTE — Progress Notes (Signed)
     SUBJECTIVE: No complaints. Chest pain resolved.   BP 113/80 mmHg  Pulse 75  Temp(Src) 98 F (36.7 C) (Oral)  Resp 20  Ht 5\' 9"  (1.753 m)  Wt 145 lb 15.1 oz (66.2 kg)  BMI 21.54 kg/m2  SpO2 98%  Intake/Output Summary (Last 24 hours) at 05/22/15 1151 Last data filed at 05/22/15 1016  Gross per 24 hour  Intake    655 ml  Output    825 ml  Net   -170 ml    PHYSICAL EXAM General: Thin cachectic elderly male in NAD. He is tachypneic.   Psych:  Good affect, responds appropriately Neck: No JVD. No masses noted.  Lungs: Clear bilaterally with no wheezes or rhonci noted.  Heart: Irreg irreg with no murmurs noted. Abdomen: Bowel sounds are present. Soft, non-tender.  Extremities: No lower extremity edema.   LABS: Basic Metabolic Panel:  Recent Labs  05/21/15 0800 05/22/15 0109  NA 138 140  K 3.8 4.0  CL 103 101  CO2 26 30  GLUCOSE 109* 109*  BUN 34* 35*  CREATININE 1.67* 1.91*  CALCIUM 9.0 9.1   CBC:  Recent Labs  05/21/15 0800  WBC 5.2  HGB 11.7*  HCT 36.8*  MCV 96.8  PLT 113*   Cardiac Enzymes:  Recent Labs  05/21/15 1904 05/22/15 0109 05/22/15 0721  TROPONINI 0.06* 0.07* 0.07*   Current Meds: . carvedilol  6.25 mg Oral BID WC  . dipyridamole-aspirin  1 capsule Oral BID  . enoxaparin (LOVENOX) injection  30 mg Subcutaneous Q24H  . furosemide  40 mg Intravenous Daily  . memantine  28 mg Oral Daily  . senna-docusate  2 tablet Oral Daily  . simvastatin  20 mg Oral q1800  . sodium chloride  3 mL Intravenous Q12H  . spironolactone  12.5 mg Oral Daily  . zolpidem  5 mg Oral Once    ASSESSMENT AND PLAN: 81 year old male with CAD, ischemic cardiomyopathy, systolic HF, PAF, status post CRT-D who presents with chest pain and shortness of breath.   1. CAD/chest pain: troponin minimally elevated with flat trend. EKG is v-paced. He has no chest pain. Would not pursue ischemic evaluation.   2. Chronic systolic CHF: He is tachypneic today. BNP is  elevated. He is on IV Lasix. He may be slightly volume overloaded. Will reassess in am.    MCALHANY,CHRISTOPHER  9/19/201611:51 AM

## 2015-05-22 NOTE — Evaluation (Signed)
Physical Therapy Evaluation Patient Details Name: Jacob Rosales MRN: [id number] DOB: November 10, 1927 Today's Date: 05/22/2015   History of Present Illness  Pt is an 81 y.o. male adm from [facility] due to CHF and AMS. PMH of chronic systolic HF with ICD/PCM, osteoporosis, HTN, CAD, insomnia   Clinical Impression  Pt with AMS, decreased awareness and orientation, unreliable historian and requires assist for all mobility. Pt reportedly from [facility] and will require SNF for discharge given assist required for all mobility as well as impaired cognition. Pt will benefit from acute therapy to maximize mobility, gait and function to decrease burden of care and fall risk.     Follow Up Recommendations SNF;Supervision/Assistance - 24 hour    Equipment Recommendations  None recommended by PT    Recommendations for Other Services OT consult     Precautions / Restrictions Precautions Precautions: Fall Precaution Comments: incontinent Restrictions Weight Bearing Restrictions: No      Mobility  Bed Mobility Overal bed mobility: Needs Assistance Bed Mobility: Supine to Sit     Supine to sit: Mod assist     General bed mobility comments: cues and assist to pivot to EOB and elevate trunk. Pt with posterior lean in sitting and required constant tactile cues and min assist in sitting to prevent posterior lean  Transfers Overall transfer level: Needs assistance   Transfers: Sit to/from Stand Sit to Stand: Min assist         General transfer comment: cues for safety with assist for balance. pt maintains crouched posture with hip, knee and trunk flexion  Ambulation/Gait Ambulation/Gait assistance: Min assist Ambulation Distance (Feet): 100 Feet Assistive device: Rolling walker (2 wheeled) Gait Pattern/deviations: Step-through pattern;Decreased stride length;Drifts right/left;Shuffle;Trunk flexed;Narrow base of support   Gait velocity  interpretation: Below normal speed for age/gender General Gait Details: pt with assist to direct RW, initiate and maintain anterior translation and assist for balance and stability  Stairs            Wheelchair Mobility    Modified Rankin (Stroke Patients Only)       Balance Overall balance assessment: Needs assistance   Sitting balance-Leahy Scale: Zero       Standing balance-Leahy Scale: Zero                               Pertinent Vitals/Pain Pain Assessment: No/denies pain  HR 75-95    Home Living Family/patient expects to be discharged to:: Private residence Living Arrangements: Spouse/significant other Available Help at Discharge: Family;Available 24 hours/day Type of Home: Independent living facility Home Access: Level entry     Home Layout: One level Home Equipment: Walker - 2 wheels      Prior Function Level of Independence: Needs assistance   Gait / Transfers Assistance Needed: per pt walking with a RW independently  ADL's / Homemaking Assistance Needed: wife assists with bathing and dressing per pt  Comments: pt not reliable historian and no family present     Hand Dominance        Extremity/Trunk Assessment   Upper Extremity Assessment: Generalized weakness           Lower Extremity Assessment: Generalized weakness      Cervical / Trunk Assessment: Kyphotic  Communication   Communication: Expressive difficulties  Cognition Arousal/Alertness: Awake/alert Behavior During Therapy: Flat affect Overall Cognitive Status: Impaired/Different from baseline Area of Impairment: Orientation;Memory;Attention;Following commands;Safety/judgement;Problem solving Orientation Level: Disoriented to;Place;Time;Situation  Current Attention Level: Focused Memory: Decreased short-term memory;Decreased recall of precautions Following Commands: Follows one step commands with increased time;Follows one step commands  inconsistently Safety/Judgement: Decreased awareness of deficits   Problem Solving: Slow processing;Decreased initiation;Difficulty sequencing;Requires verbal cues;Requires tactile cues      General Comments      Exercises        Assessment/Plan    PT Assessment Patient needs continued PT services  PT Diagnosis Difficulty walking;Abnormality of gait;Altered mental status   PT Problem List Decreased strength;Decreased cognition;Decreased activity tolerance;Decreased balance;Decreased safety awareness;Decreased knowledge of use of DME;Decreased mobility  PT Treatment Interventions Gait training;DME instruction;Functional mobility training;Therapeutic activities;Therapeutic exercise;Balance training;Patient/family education;Cognitive remediation   PT Goals (Current goals can be found in the Care Plan section) Acute Rehab PT Goals PT Goal Formulation: Patient unable to participate in goal setting Time For Goal Achievement: 06/05/15 Potential to Achieve Goals: Fair    Frequency Min 3X/week   Barriers to discharge Decreased caregiver support      Co-evaluation               End of Session Equipment Utilized During Treatment: Gait belt Activity Tolerance: Patient tolerated treatment well Patient left: in chair;with call bell/phone within reach;with chair alarm set Nurse Communication: Mobility status         Time: 7062-3762 PT Time Calculation (min) (ACUTE ONLY): 20 min   Charges:   PT Evaluation $Initial PT Evaluation Tier I: 1 Procedure     PT G CodesMelford Aase 05/22/2015, 11:38 AM Elwyn Reach, [place]

## 2015-05-22 NOTE — Progress Notes (Signed)
   05/22/15 0900  Clinical Encounter Type  Visited With Patient;Health care provider  Visit Type Initial;Spiritual support  Referral From Nurse;Patient  Spiritual Encounters  Spiritual Needs Prayer;Emotional  [place] responding to RN/Pt consult; [place] met briefly with RN to get pt update; pt was completing PT with therapist; [place] talked with pt and offered spiritual support; pt was alert but breathing was labored and pt had difficulty with his speech; was unsure of location.  [place] will try to follow-up later this week. 9:52 AM Gwynn Burly

## 2015-05-22 NOTE — Telephone Encounter (Signed)
New message     FYI---Pt is in the hospital at this time and cannot have labs drawn tomorrow Pt went to hospital on 9-18

## 2015-05-22 NOTE — Progress Notes (Signed)
Notified daughter that pt will have safety sitter as soon as one is available from staffing.
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
## 2015-05-23 DIAGNOSIS — I2510 Atherosclerotic heart disease of native coronary artery without angina pectoris: Secondary | ICD-10-CM

## 2015-05-23 LAB — BASIC METABOLIC PANEL
ANION GAP: 12 (ref 5–15)
BUN: 38 mg/dL — AB (ref 6–20)
CHLORIDE: 102 mmol/L (ref 101–111)
CO2: 25 mmol/L (ref 22–32)
Calcium: 8.8 mg/dL — ABNORMAL LOW (ref 8.9–10.3)
Creatinine, Ser: 1.89 mg/dL — ABNORMAL HIGH (ref 0.61–1.24)
GFR calc Af Amer: 36 mL/min — ABNORMAL LOW (ref >60)
GFR calc non Af Amer: 31 mL/min — ABNORMAL LOW (ref >60)
GLUCOSE: 96 mg/dL (ref 65–99)
POTASSIUM: 3.5 mmol/L (ref 3.5–5.1)
SODIUM: 139 mmol/L (ref 135–145)

## 2015-05-23 LAB — TROPONIN I
Troponin I: 0.08 ng/mL — ABNORMAL HIGH (ref <0.031)
Troponin I: 0.08 ng/mL — ABNORMAL HIGH (ref <0.031)
Troponin I: 0.07 ng/mL — ABNORMAL HIGH (ref <0.031)

## 2015-05-23 MED ORDER — FUROSEMIDE 40 MG PO TABS: 40.0000 mg | ORAL_TABLET | Freq: Every day | ORAL | Status: DC
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
## 2015-05-23 NOTE — Progress Notes (Signed)
Attempted to give report when calling [facility] at [phone].  Left VM message with the clinical nurse manager requesting a return call so that I can give report to receiving nurse.
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
## 2015-05-24 ENCOUNTER — Telehealth: Payer: Self-pay

## 2015-05-24 ENCOUNTER — Ambulatory Visit (INDEPENDENT_AMBULATORY_CARE_PROVIDER_SITE_OTHER): Payer: Medicare Other

## 2015-05-24 DIAGNOSIS — Z95810 Presence of automatic (implantable) cardiac defibrillator: Secondary | ICD-10-CM | POA: Diagnosis not present

## 2015-05-24 DIAGNOSIS — I509 Heart failure, unspecified: Secondary | ICD-10-CM

## 2015-05-24 NOTE — Telephone Encounter (Signed)
Call to wife, [name] for ICM follow up.  She stated patient is in [facility] following hospitalization 05/21/2015.  He does not have [facility]lin Monitor in SNF and requested she take the monitor to his bedside and send a manual transmission.  Reviewed manual process.  She stated she would do that today.

## 2015-05-24 NOTE — Telephone Encounter (Signed)
Received voice mail from wife for return call.  Returned call to wife, [name].  She reported the monitor has been moved to his SNF and she was checking if the transmission was received.  Advised no transmission was received.  She requested I call the nurses desk at the SNF to have the nurse send it since she was not in the room with him now.  I stated I would attempt to call and if I don't reach the nurse, I would call her back tomorrow.  SNF number [phone].  Attempted call to nurse at [facility] and unable to reach.

## 2015-05-24 NOTE — Clinical Social Work Note (Signed)
Clinical Social Work Assessment  Patient Details  Name: Jacob Rosales MRN: [id number] Date of Birth: May 01, 1928  Date of referral:  05/23/15               Reason for consult:  Facility Placement (From Independent living at [facility]. Increase level of care to SNF)                Permission sought to share information with:  Other (Pt alert to person only. Daughter ) Permission granted to share information::   (by daughter and wife)  Name::     Jacob Rosales    Housing/Transportation Living arrangements for the past 2 months:  [place] of Information:  Adult Children, Spouse Patient Interpreter Needed:  None Criminal Activity/Legal Involvement Pertinent to Current Situation/Hospitalization:  No - Comment as needed Significant Relationships:  Spouse, Adult Children, Siblings Lives with:  Facility Resident Do you feel safe going back to the place where you live?  Yes Need for family participation in patient care:  Yes (Comment)  Care giving concerns:  From [place] with his wife. Family feels increased level of care is needed in agreement with PT recommendation.   Social Worker assessment / plan:  81 year old male admitted from [facility].  Increased level of care to SNF is indicated per PT due to unsteady gait and confusion. Patient's daughter Jacob Rosales and wife Jacob Rosales are in full agreement with this. CSW notified Jacob Rosales at [facility] and she stated that they had a SNF bed available for patient. They do not require a PASARR or a 3 day inpatient stay for Medicare.  Per MD- patient is medically stable for d/c today via EMS to SNF.  Fl2 completed and signed by MD; PTAR arranged and notified patient's wife and sister.  Nursing notified to call report.  No further CSW needs identified. CSW signing off.      Employment status:  Retired Forensic scientist:  Medicare PT Recommendations:  [place]  / Referral to community resources:  [place]  Patient/Family's Response to care:  Daughter, wife and sister all verbalized happiness that SNF bed was located at [facility] for patient. They do not feel he was safe to return to [place].   Patient/Family's Understanding of and Emotional Response to Diagnosis, Current Treatment, and Prognosis:  Daughter and wife appears to have a good understanding of the reason for patient's admission to the hospital as well as his current treatment needs and prognosis.  Daughter questions feasibility of patient being able to return back with his wife to [place] in the future.  Discussed possible need to seek ALF at facility and she stated she was already looking into this.    Emotional Assessment Appearance:  Appears stated age Attitude/Demeanor/Rapport:   (NA) Affect (typically observed):  Quiet, Calm Orientation:  Oriented to Self Alcohol / Substance use:  Alcohol Use ("social drinker") Psych involvement (Current and /or in the community):  No (Comment)  Discharge Needs  Concerns to be addressed:  Care Coordination Readmission within the last 30 days:  No Current discharge risk:  Cognitively Impaired Barriers to Discharge:  No Barriers Identified   Estill Bakes 05/23/2015  2:15 PM

## 2015-05-25 NOTE — Progress Notes (Signed)
Non-ICM portion of device remote rescheduled to 07/13/15. Pt was checked in office 04/12/15.

## 2015-05-26 NOTE — Telephone Encounter (Signed)
Spoke with wife

## 2015-05-26 NOTE — Telephone Encounter (Signed)
Follow up     Wife brought box to [facility].  They plugged it up and pushed the button and now the box is beeping and will not stop.  Please call

## 2015-05-26 NOTE — Progress Notes (Signed)
EPIC Encounter for ICM Monitoring  Patient Name: Jacob Rosales is a 81 y.o. male Date: 05/26/2015 Primary Care Physican: Hollace Kinnier, DO Primary Cardiologist:  Electrophysiologist: Lovena Le Dry Weight: Unknown - in SNF       In the past month, have you:  1. Gained more than 2 pounds in a day or more than 5 pounds in a week? no  2. Had changes in your medications (with verification of current medications)? No, currently in SNF  3. Had more shortness of breath than is usual for you? no  4. Limited your activity because of shortness of breath? no  5. Not been able to sleep because of shortness of breath? no  6. Had increased swelling in your feet or ankles? no  7. Had symptoms of dehydration (dizziness, dry mouth, increased thirst, decreased urine output) no  8. Had changes in sodium restriction? no  9. Been compliant with medication? Yes   ICM trend:   Follow-up plan: ICM clinic phone appointment on 06/01/2015 for repeat transmission.  1st encounter for ICM follow up.  Patient hospitalized 05/21/2015. Corvue impedance is above baseline.  Spoke with wife and encouraged her to have patient drink fluids to stay hydrated.  She stated he has not drank a lot since being in SNF.  His dementia has been worse.  She is unsure if she will be able to care for him at home and will have a meeting with his care team next week.  Requested she send repeat transmission on 06/01/2015 and she stated she would do so.    Copy of note sent to patient's primary care physician, primary cardiologist, and device following physician.  Rosalene Billings, RN, CCM 05/26/2015 4:22 PM

## 2015-05-26 NOTE — Telephone Encounter (Signed)
Call to wife regarding ICM transmission.  She stated she was not at the SNF yesterday because she was trying to get some rest.  Advised no transmission has been received and the next time she is in his room, she can try another transmission.  Explained he should be within 4-6 feet of monitor for transmission.  She stated it may have been too far from patient when she tried to send the transmission earlier this week.  She will try again.

## 2015-05-30 ENCOUNTER — Non-Acute Institutional Stay (SKILLED_NURSING_FACILITY): Payer: Medicare Other | Admitting: Internal Medicine

## 2015-05-30 DIAGNOSIS — T17800D Unspecified foreign body in other parts of respiratory tract causing asphyxiation, subsequent encounter: Secondary | ICD-10-CM | POA: Diagnosis not present

## 2015-05-30 DIAGNOSIS — R1314 Dysphagia, pharyngoesophageal phase: Secondary | ICD-10-CM

## 2015-05-30 DIAGNOSIS — G214 Vascular parkinsonism: Secondary | ICD-10-CM | POA: Diagnosis not present

## 2015-05-30 DIAGNOSIS — I482 Chronic atrial fibrillation, unspecified: Secondary | ICD-10-CM

## 2015-05-30 DIAGNOSIS — G309 Alzheimer's disease, unspecified: Secondary | ICD-10-CM | POA: Diagnosis not present

## 2015-05-30 DIAGNOSIS — I5023 Acute on chronic systolic (congestive) heart failure: Secondary | ICD-10-CM | POA: Diagnosis not present

## 2015-05-30 DIAGNOSIS — F0150 Vascular dementia without behavioral disturbance: Secondary | ICD-10-CM

## 2015-05-30 DIAGNOSIS — F0280 Dementia in other diseases classified elsewhere without behavioral disturbance: Secondary | ICD-10-CM | POA: Diagnosis not present

## 2015-05-30 DIAGNOSIS — R2681 Unsteadiness on feet: Secondary | ICD-10-CM | POA: Diagnosis not present

## 2015-05-30 LAB — BASIC METABOLIC PANEL
BUN: 38 mg/dL — AB (ref 4–21)
CREATININE: 1.4 mg/dL — AB (ref 0.6–1.3)
Glucose: 80 mg/dL
POTASSIUM: 4.1 mmol/L (ref 3.4–5.3)
Sodium: 139 mmol/L (ref 137–147)
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
## 2015-06-01 ENCOUNTER — Ambulatory Visit (INDEPENDENT_AMBULATORY_CARE_PROVIDER_SITE_OTHER): Payer: Medicare Other

## 2015-06-01 DIAGNOSIS — Z95810 Presence of automatic (implantable) cardiac defibrillator: Secondary | ICD-10-CM

## 2015-06-01 DIAGNOSIS — I509 Heart failure, unspecified: Secondary | ICD-10-CM

## 2015-06-01 NOTE — Progress Notes (Addendum)
EPIC Encounter for ICM Monitoring  Patient Name: Jacob Rosales is a 81 y.o. male Date: 06/01/2015 Primary Care Physican: Hollace Kinnier, DO Primary Cardiologist:  Electrophysiologist: Lovena Le Dry Weight: Unknown, in SNF at [facility]       In the past month, have you:  1. Gained more than 2 pounds in a day or more than 5 pounds in a week? Unknown  2. Had changes in your medications (with verification of current medications)? no  3. Had more shortness of breath than is usual for you? no  4. Limited your activity because of shortness of breath? no  5. Not been able to sleep because of shortness of breath? no  6. Had increased swelling in your feet or ankles? Yes, slightly in last week but has resolved.  Wearing compression stockings  7. Had symptoms of dehydration (dizziness, dry mouth, increased thirst, decreased urine output) no  8. Had changes in sodium restriction? no  9. Been compliant with medication? Yes   ICM trend: 3  Follow-up plan: ICM clinic phone appointment on 06/13/2015 for repeat post hospital transmission.  CorVue impedance above baseline 05/12/2015 to 06/01/2015.  Spoke with wife, [name].  She stated she thinks he has increased his oral fluid intake.  She denied he has any dizziness and did have slight swelling in feet/ankles in the last week.  He is wearing compression stockings and feet/ankle swelling has resolved.  No shortness of breath noted by wife.  He is currently at [facility] and receiving physical therapy to increase strength.  Reminded wife the patient has a post hospital follow up on 06/07/2015 with Angelena Form, PA-C.  Wife asked if I would call the SNF due to trouble with Merlin monitoring beeping.          Reviewed Optivol with Dr Lovena Le and no changes at this time.  Patient has a post hospital visit scheduled for 06/07/2015.         Call to [facility] at [phone] and spoke with nurse, Manuela Schwartz.  She stated the Merlin has been beeping but after  pressing the white button it now is showing just green light at bottom right this is lit up.  She asked if       there is anything that she needs to do with the monitor.  I provided Scientist, product/process development support number in the event there are problems with the monitor.  She asked if it is necessary for patient to be on so many meds because       he is having difficulty swallowing.  Advised I will send message to Angelena Form PA-C regarding patients difficulty swallowing so she discuss with patient and wife at visit, 06/07/2015.  Copy of note sent to patient's primary care physician, primary cardiologist, and device following physician.  Rosalene Billings, RN, CCM 06/01/2015 10:27 AM

## 2015-06-01 NOTE — Discharge Summary (Signed)
This is an addendum to the discharge summary done by me on 05/23/15  Acute delirium: Acute delirium, in settings of advanced dementia. Patient has dementia, while he was in the hospital he developed confusion, likely secondary to acute delirium. CT scan of the head was done it was negative for acute abnormalities.  Birdie Hopes Pager: [phone] 06/01/2015, 5:08 PM

## 2015-06-03 ENCOUNTER — Other Ambulatory Visit: Payer: Self-pay | Admitting: Internal Medicine

## 2015-06-05 ENCOUNTER — Other Ambulatory Visit: Payer: Self-pay | Admitting: *Deleted

## 2015-06-05 NOTE — Telephone Encounter (Signed)
Open In error. 
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
## 2015-06-07 ENCOUNTER — Ambulatory Visit (INDEPENDENT_AMBULATORY_CARE_PROVIDER_SITE_OTHER): Payer: Medicare Other | Admitting: Physician Assistant

## 2015-06-07 ENCOUNTER — Ambulatory Visit: Payer: Medicare Other | Admitting: Physician Assistant

## 2015-06-07 ENCOUNTER — Encounter: Payer: Self-pay | Admitting: Physician Assistant

## 2015-06-07 ENCOUNTER — Encounter: Payer: Medicare Other | Admitting: Internal Medicine

## 2015-06-07 VITALS — BP 120/70 | HR 75 | Ht 69.0 in | Wt 145.0 lb

## 2015-06-07 DIAGNOSIS — I5033 Acute on chronic diastolic (congestive) heart failure: Secondary | ICD-10-CM | POA: Diagnosis not present

## 2015-06-07 DIAGNOSIS — I255 Ischemic cardiomyopathy: Secondary | ICD-10-CM | POA: Diagnosis not present

## 2015-06-07 DIAGNOSIS — E785 Hyperlipidemia, unspecified: Secondary | ICD-10-CM

## 2015-06-07 DIAGNOSIS — I10 Essential (primary) hypertension: Secondary | ICD-10-CM

## 2015-06-07 DIAGNOSIS — I2510 Atherosclerotic heart disease of native coronary artery without angina pectoris: Secondary | ICD-10-CM

## 2015-06-07 DIAGNOSIS — I2583 Coronary atherosclerosis due to lipid rich plaque: Secondary | ICD-10-CM

## 2015-06-07 LAB — BASIC METABOLIC PANEL
BUN: 30 mg/dL — AB (ref 7–25)
CO2: 24 mmol/L (ref 20–31)
Calcium: 9 mg/dL (ref 8.6–10.3)
Chloride: 103 mmol/L (ref 98–110)
Creat: 1.38 mg/dL — ABNORMAL HIGH (ref 0.70–1.11)
GLUCOSE: 99 mg/dL (ref 65–99)
Potassium: 4.3 mmol/L (ref 3.5–5.3)
Sodium: 137 mmol/L (ref 135–146)

## 2015-06-07 MED ORDER — APIXABAN 2.5 MG PO TABS: 2.5000 mg | ORAL_TABLET | Freq: Two times a day (BID) | ORAL | Status: DC

## 2015-06-07 NOTE — Patient Instructions (Addendum)
Medication Instructions:  Your physician has recommended you make the following change in your medication:  1.  STOP taking Aggrenox 2.  START Eliquis 2.5 mg taking 1 tablet twice a day   Labwork: TODAY:  BMET  Testing/Procedures: None ordered  Follow-Up: Your physician recommends that you schedule a follow-up appointment in: [place] AM[facility] TO DISCUSS FUTURE PLAN OF CARE

## 2015-06-09 ENCOUNTER — Telehealth: Payer: Self-pay | Admitting: *Deleted

## 2015-06-09 NOTE — Telephone Encounter (Signed)
Called to report that the pt's labs are looking good and that his kidney function has improved and that he will continue on the regimen he currently is on.  Pt's wife, [name], verbalized understanding.Marland Kitchen

## 2015-06-09 NOTE — Telephone Encounter (Signed)
-----   Message from Eileen Stanford, PA-C sent at 06/08/2015  5:49 AM EDT ----- Can you let mr Christian know that labs look good- renal function improved. No change in plan. Thanks!

## 2015-06-10 ENCOUNTER — Encounter: Payer: Self-pay | Admitting: Internal Medicine

## 2015-06-10 NOTE — Progress Notes (Signed)
Patient ID: Jacob Rosales, male   DOB: 08/25/1928, 81 y.o.   MRN: [id number] 20 mins spent on admission and 20 mins spent on advance care planning/MOST completion.

## 2015-06-13 ENCOUNTER — Ambulatory Visit (INDEPENDENT_AMBULATORY_CARE_PROVIDER_SITE_OTHER): Payer: Medicare Other

## 2015-06-13 DIAGNOSIS — Z95810 Presence of automatic (implantable) cardiac defibrillator: Secondary | ICD-10-CM

## 2015-06-13 DIAGNOSIS — I5033 Acute on chronic diastolic (congestive) heart failure: Secondary | ICD-10-CM

## 2015-06-13 LAB — CUP PACEART REMOTE DEVICE CHECK
Date Time Interrogation Session: 20161011154331
Implantable Lead Implant Date: 20070723
Implantable Lead Location: 753859
Implantable Lead Location: 753860
MDC IDC LEAD IMPLANT DT: 20070723
MDC IDC LEAD IMPLANT DT: 20070917
MDC IDC LEAD LOCATION: 753858
MDC IDC PG SERIAL: 631860
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
## 2015-06-22 ENCOUNTER — Telehealth: Payer: Self-pay

## 2015-06-22 NOTE — Telephone Encounter (Signed)
Patient's lab work show worsening renal function after increased lasix from 40mg  qd to BID for the last 2-3 days. BNP elevated at 1844 despite increased diuretics. I spoke with the patient's wife and told her to go back to go back to 40mg  Lasix qd. I called in spironolactone 12.5 mg qd as he is NYHA class II with EF <30%. We will repeat a BMET next week. I will have an order faxed to [facility] so they can get it done at their own facility next Tuesday.   Angelena Form PA-C [facility]   Will fax to [facility] [phone]. Please fax results to [phone].

## 2015-06-23 ENCOUNTER — Encounter: Payer: Self-pay | Admitting: Adult Health

## 2015-06-23 ENCOUNTER — Non-Acute Institutional Stay (SKILLED_NURSING_FACILITY): Payer: Medicare Other | Admitting: Adult Health

## 2015-06-23 ENCOUNTER — Telehealth: Payer: Self-pay

## 2015-06-23 ENCOUNTER — Encounter: Payer: Self-pay | Admitting: Internal Medicine

## 2015-06-23 DIAGNOSIS — G4700 Insomnia, unspecified: Secondary | ICD-10-CM

## 2015-06-23 DIAGNOSIS — I509 Heart failure, unspecified: Secondary | ICD-10-CM

## 2015-06-23 DIAGNOSIS — E785 Hyperlipidemia, unspecified: Secondary | ICD-10-CM | POA: Diagnosis not present

## 2015-06-23 MED ORDER — LISINOPRIL 10 MG PO TABS: 10.0000 mg | ORAL_TABLET | Freq: Every day | ORAL | Status: DC

## 2015-06-23 NOTE — Telephone Encounter (Signed)
Called Shelton Silvas RN with [facility]. Informed her Melina Copa PA (FLEX) advised that patient's lisinopril be decreased to 10 mg by mouth daily and to follow up with results of changes. Nurse verbalized understanding.

## 2015-06-23 NOTE — Telephone Encounter (Signed)
[place] with [facility] called to inform the office that patient's BP has been low. Patient's BP readings are being fax over for DOD/FLEX to review and see if patient's medications need adjusting.

## 2015-06-23 NOTE — Progress Notes (Signed)
Patient ID: Jacob Rosales, male   DOB: [birth year], 81 y.o.   MRN: [id number]  Location:  [facility] Provider:  Cindi Carbon ANP  Code Status:  DNR, has most form     Chief Complaint  Patient presents with  . Medical Management of Chronic Issues    HPI:  81 y.o.male  residing at [facility], rehab section. He was originally admitted on 9/20 after a hospitalization for CHF. He is now awaiting a skilled bed due to his progressive dementia and dysphagia (difficulty following diet at home).  He has a hx of CHF, HTN, ICM, PPM, CAD, afib, vascular parkinsonism, constipation, and OP. I am here to review his chronic medical issues. The staff reported that he was getting up frequently at night and was not sleeping well. Melatonin was added but did not help, later ativan 0.25 qhs was added and seems to have helped with bedtime agitation.  He has chronic CHF and his weights have been stable. He has CKD with a baseline CR of 1.4.  His lasix was reduced to once daily for this reason by cardiology on 10/20.  He has no reports of SOB or CP at this time.  He has progressive dementia with hx of CVAs on brain imaging. His last cognitive test [facility]) on 9/22 was 11/30.   Staff reported some congestion after meals, presumably due to dysphagia. He was placed on mucinex and this has helped to a degree per the staff.    Review of Systems:  Review of Systems  Constitutional: Negative for fever, chills and malaise/fatigue.  HENT: Positive for hearing loss. Negative for congestion.   Eyes: Negative for blurred vision.       Glasses  Respiratory: Negative for cough and shortness of breath.   Cardiovascular: Negative for chest pain and leg swelling.  Gastrointestinal: Negative for abdominal pain, constipation, blood in stool and melena.  Genitourinary: Negative for dysuria.  Musculoskeletal: Positive for falls.  Neurological: Positive for tremors. Negative for weakness.    Psychiatric/Behavioral: Positive for memory loss.    Past Medical History  Diagnosis Date  . Complete AV block ([place])   . Chronic systolic heart failure ([place])     a. Echo 4/16:  EF 25%, diff HK, Ao sclerosis without stenosis, mild AI, mild dilation of aortic root, mod MR, mod LAE, mildly reduced RVSF, PASP 52 mmHg  . Cardiomyopathy, ischemic     a. s/p STJ CRTD  . CAD (coronary artery disease)     s/p CABG  . Prostate cancer ([place])     S/P radiation rx  . Osteoporosis     A/P Vertebral compression fx's  . Hypertension   . Dyslipidemia   . TIA (transient ischemic attack)        . Lumbago    . Cervicalgia    . Unspecified vitamin D deficiency    . Basal cell carcinoma of skin of lower limb, including hip    . Squamous cell carcinoma of skin of lower limb, including hip    . Impotence of organic origin    . Insomnia, unspecified    . Paroxysmal atrial fibrillation (HCC)      a. intol of coumadin - Rx with Aggrenox  . Fracture of C2 vertebra, closed ([place])     s/p cervical fusion 05/2013    Patient Active Problem List   Diagnosis Date Noted  . Acute on chronic diastolic CHF (congestive heart failure), NYHA class 1 ([place]) 05/21/2015  .  CHF (congestive heart failure) ([place]) 05/21/2015  . Sepsis ([place]) 03/10/2015  . UTI (lower urinary tract infection) 03/10/2015  . Acute cystitis without hematuria   . Insomnia 01/05/2015  . Protein-calorie malnutrition, severe ([place]) 12/22/2014  . Confusion   . LFT elevation   . Acute on chronic systolic CHF (congestive heart failure), NYHA class 3 ([place]) 12/18/2014  . Unstable gait 07/25/2014  . Memory loss 07/25/2014  . Prostate cancer ([place])   . Cognitive change 09/29/2013  . Rash and nonspecific skin eruption 07/18/2013  . Loss of weight 07/05/2013  . Right foot drop 07/05/2013  . Essential hypertension 07/05/2013  . Generalized anxiety disorder 05/30/2013  . Unspecified constipation 05/28/2013  . Fracture of second cervical vertebra ([place])  05/21/2013  . Osteoporosis with fracture 04/05/2013  . Dyslipidemia   . CAD (coronary artery disease)   . Advanced care planning/counseling discussion 01/14/2013  . Lumbago 10/05/2012  . Unspecified vitamin D deficiency 10/07/2011  . Insomnia, unspecified 04/09/2010  . Full incontinence of feces 10/09/2009  . Ischemic cardiomyopathy 05/25/2009  . AV BLOCK, COMPLETE 05/25/2009  . Acute on chronic systolic congestive heart failure ([place]) 05/25/2009  . Biventricular automatic implantable cardioverter defibrillator in situ 05/25/2009  . PAF (paroxysmal atrial fibrillation) ([place]) 05/15/2006  . Unspecified urinary incontinence 05/15/2006    Allergies  Allergen Reactions  . Lisinopril Other (See Comments)    Acute renal failure  . Warfarin Sodium Other (See Comments)    REACTION: Stomach bleeding  . Amlodipine Besylate Swelling    Medications: Patient's Medications  New Prescriptions   No medications on file  Previous Medications   ACETAMINOPHEN (TYLENOL) 325 MG TABLET    Take 325 mg by mouth every 6 (six) hours as needed for mild pain or moderate pain.   APIXABAN (ELIQUIS) 2.5 MG TABS TABLET    Take 1 tablet (2.5 mg total) by mouth 2 (two) times daily.   BISACODYL (DULCOLAX) 5 MG EC TABLET    Take 5 mg by mouth daily as needed for mild constipation or moderate constipation.   CALCIUM CARBONATE (TUMS EX) 750 MG CHEWABLE TABLET    Chew 2 tablets by mouth 2 (two) times daily.   CARVEDILOL (COREG) 6.25 MG TABLET    Take 6.25 mg by mouth 2 (two) times daily with a meal.   CHOLECALCIFEROL (VITAMIN D) 1000 UNITS TABLET    Take 1,000 Units by mouth daily.    FEEDING SUPPLEMENT (BOOST / RESOURCE BREEZE) LIQD    Take 1 Container by mouth 3 (three) times daily between meals.   FUROSEMIDE (LASIX) 20 MG TABLET    Take 2 tablets (40 mg total) by mouth daily.   GUAIFENESIN (MUCINEX) 600 MG 12 HR TABLET    Take 600 mg by mouth 2 (two) times daily.   ISOSORBIDE-HYDRALAZINE (BIDIL) 20-37.5 MG TABLET     Take 1 tablet by mouth 2 (two) times daily.   LISINOPRIL (PRINIVIL,ZESTRIL) 20 MG TABLET    TAKE 1 TABLET BY MOUTH ONCE DAILY TO CONTROL BLOOD PRESSURE   LORAZEPAM (ATIVAN) 0.5 MG TABLET    Take 0.25 mg by mouth at bedtime. Take 1/2 tab   MELATONIN 5 MG CAPS    Take 1 capsule by mouth at bedtime.    MEMANTINE (NAMENDA XR) 28 MG CP24 24 HR CAPSULE    Take 28 mg by mouth daily.   SENNA-DOCUSATE (SENOKOT-S) 8.6-50 MG PER TABLET    Take 2 tablets by mouth at bedtime.    SIMVASTATIN (ZOCOR) 20 MG TABLET  TAKE 1 TABLET BY MOUTH AT BEDTIME FOR CHOLESTEROL   SPIRONOLACTONE (ALDACTONE) 25 MG TABLET    Take 0.5 tablets (12.5 mg total) by mouth daily.  Modified Medications   No medications on file  Discontinued Medications   NYSTATIN-TRIAMCINOLONE (MYCOLOG II) CREAM    Apply 1 application topically 2 (two) times daily.    POTASSIUM CHLORIDE SA (KLOR-CON M20) 20 MEQ TABLET    Take 1 tablet (20 mEq total) by mouth 2 (two) times daily. For 2 days, while taking the increased Lasix    Physical Exam: Filed Vitals:   06/23/15 1116  Weight: 140 lb (63.504 kg)   Body mass index is 20.67 kg/(m^2). Wt Readings from Last 3 Encounters:  06/23/15 140 lb (63.504 kg)  06/07/15 145 lb (65.772 kg)  05/30/15 142 lb 6.4 oz (64.592 kg)    Physical Exam  Constitutional: No distress.  Tall frail white male seated in recliner in his room  HENT:  Head: Normocephalic and atraumatic.  Cardiovascular: Normal heart sounds and intact distal pulses.   irreg irreg  Pulmonary/Chest: Effort normal and breath sounds normal. He has no wheezes. He has no rales.  Abdominal: Soft. Bowel sounds are normal. He exhibits no distension. There is no tenderness.  Musculoskeletal:  Unsteady shuffling gait and some rigidity of left more than right arm and flat affect  Neurological: He is alert.  Oriented to person and place, not precise time  Skin: Skin is warm and dry.  Psychiatric:  Flat affect; some hypophonia also    Labs  reviewed: Basic Metabolic Panel:  Recent Labs  12/19/14 0125  05/22/15 0109 05/23/15 0117 05/30/15 06/07/15 1326  NA 141  < > 140 139 139 137  K 4.1  < > 4.0 3.5 4.1 4.3  CL 106  < > 101 102  --  103  CO2 23  < > 30 25  --  24  GLUCOSE 117*  < > 109* 96  --  99  BUN 48*  < > 35* 38* 38* 30*  CREATININE 1.86*  < > 1.91* 1.89* 1.4* 1.38*  CALCIUM 9.2  < > 9.1 8.8*  --  9.0  MG 2.2  --   --   --   --   --   < > = values in this interval not displayed.  Liver Function Tests:  Recent Labs  12/19/14 1923 12/21/14 0736 12/29/14 03/10/15 1807  AST 199* 106* 27 48*  ALT 257* 199* 38 51  ALKPHOS 65 63 42 70  BILITOT 2.1* 1.9*  --  1.2  PROT 6.5 6.6  --  6.9  ALBUMIN 3.7 3.6  --  3.8    CBC:  Recent Labs  12/18/14 1411  03/10/15 1807 03/11/15 0425 03/21/15 05/18/15 1616 05/21/15 0800  WBC 5.7  < > 8.7 6.7 4.7 5.3 5.2  NEUTROABS 4.5  --  7.5  --   --  4.1  --   HGB 13.3  < > 13.5 13.1 12.8* 13.3 11.7*  HCT 40.1  < > 40.5 40.7 38* 41.0 36.8*  MCV 96.9  < > 95.5 94.9  --  96.7 96.8  PLT 108*  < > 151 144* 187 125.0* 113*  < > = values in this interval not displayed.  Lab Results  Component Value Date   TSH 1.81 04/27/2014   Lab Results  Component Value Date   HGBA1C * 01/13/2010    5.7 (NOTE)  According to the ADA Clinical Practice Recommendations for 2011, when HbA1c is used as a screening test:   >=6.5%   Diagnostic of Diabetes Mellitus           (if abnormal result  is confirmed)  5.7-6.4%   Increased risk of developing Diabetes Mellitus  References:Diagnosis and Classification of Diabetes Mellitus,Diabetes GPQD,8264,15(AXENM 1):S62-S69 and Standards of Medical Care in         Diabetes - 2011,Diabetes MHWK,0881,10  (Suppl 1):S11-S61.   Lab Results  Component Value Date   CHOL 120 07/05/2014   HDL 49 07/05/2014   LDLCALC 63 07/05/2014   TRIG 76 07/05/2014   CHOLHDL 2.3 01/13/2010     Significant Diagnostic Results reviewed since last visit:  03/10/15:  CXR:  Cardiomegaly with pulmonary vascular congestion and possible mildinterstitial pulmonary edema. New small right pleural effusion. 03/11/15:  CXR:  Cardiomegaly with mild interstitial edema and small right pleural effusion. 03/20/15:  CXR:  Improving CHF with near total resolution of the small right pleural effusion. Patient Care Team: Gayland Curry, DO as PCP - General (Geriatric Medicine) Jarome Matin, MD as Consulting Physician (Dermatology) Evans Lance, MD as Consulting Physician (Cardiology) [place], MD as Consulting Physician (Neurosurgery) Bjorn Loser, MD as Consulting Physician (Urology)  Assessment/Plan   1. Dysphagia, pharyngoesophageal phase -continue D 2 diet with NTL , tolerating well -continue mucinex  2. Mixed Alzheimer's and vascular dementia -prior strokes on brain imaging but also gradual cognitive decline -cont supportive care, as well as namenda  3. Chronic systolic CHF -weights stable, denies SOB or CP -continue ACE but follow BMP closely due to hx of ARF -continue lasix daily and aldactone  4. Constipation -controlled  5. Insomnia -improved, continue melatonin and ativan  6. HLD -check lipid panel, lfts   Cindi Carbon, ANP [facility] [phone]

## 2015-06-30 LAB — BASIC METABOLIC PANEL
BUN: 38 mg/dL — AB (ref 4–21)
CREATININE: 1.4 mg/dL — AB (ref 0.6–1.3)
Glucose: 89 mg/dL
Potassium: 4.4 mmol/L (ref 3.4–5.3)
Sodium: 137 mmol/L (ref 137–147)

## 2015-06-30 LAB — HEPATIC FUNCTION PANEL
ALK PHOS: 64 U/L (ref 25–125)
ALT: 19 U/L (ref 10–40)
AST: 18 U/L (ref 14–40)
BILIRUBIN, TOTAL: 0.8 mg/dL

## 2015-06-30 LAB — LIPID PANEL
Cholesterol: 96 mg/dL (ref 0–200)
HDL: 35 mg/dL (ref 35–70)
LDL Cholesterol: 49 mg/dL
TRIGLYCERIDES: 61 mg/dL (ref 40–160)

## 2015-07-05 ENCOUNTER — Ambulatory Visit (INDEPENDENT_AMBULATORY_CARE_PROVIDER_SITE_OTHER): Payer: Medicare Other | Admitting: Internal Medicine

## 2015-07-05 ENCOUNTER — Encounter: Payer: Self-pay | Admitting: Internal Medicine

## 2015-07-05 VITALS — BP 118/62 | HR 72 | Ht 74.0 in | Wt 126.2 lb

## 2015-07-05 DIAGNOSIS — I509 Heart failure, unspecified: Secondary | ICD-10-CM | POA: Diagnosis not present

## 2015-07-05 DIAGNOSIS — I255 Ischemic cardiomyopathy: Secondary | ICD-10-CM | POA: Diagnosis not present

## 2015-07-05 DIAGNOSIS — I480 Paroxysmal atrial fibrillation: Secondary | ICD-10-CM | POA: Diagnosis not present

## 2015-07-05 DIAGNOSIS — I442 Atrioventricular block, complete: Secondary | ICD-10-CM

## 2015-07-05 DIAGNOSIS — I5022 Chronic systolic (congestive) heart failure: Secondary | ICD-10-CM

## 2015-07-05 DIAGNOSIS — Z95810 Presence of automatic (implantable) cardiac defibrillator: Secondary | ICD-10-CM | POA: Diagnosis not present

## 2015-07-05 DIAGNOSIS — I10 Essential (primary) hypertension: Secondary | ICD-10-CM

## 2015-07-05 NOTE — Progress Notes (Signed)
HPI [name] returns today for followup. He is a very pleasant 81 year old man with a history of coronary artery disease, status post myocardial infarction, status post bypass surgery, status post ICD implantation. In the interim, he has recovered from his spinal fracture, although he appears to be more frail. He denies chest pain or syncope. He has class II heart failure symptoms. He denies any ICD shock.  He denies dietary indiscretion with sodium. He notes modest peripheral edema, which has now improved. He denies sodium indiscretion.He has been hospitalized several times for volume overload. He is better now. Allergies  Allergen Reactions  . Lisinopril Other (See Comments)    Acute renal failure  . Warfarin Sodium Other (See Comments)    REACTION: Stomach bleeding  . Amlodipine Besylate Swelling     Current Outpatient Prescriptions  Medication Sig Dispense Refill  . acetaminophen (TYLENOL) 325 MG tablet Take 325 mg by mouth every 6 (six) hours as needed for mild pain or moderate pain.    Marland Kitchen apixaban (ELIQUIS) 2.5 MG TABS tablet Take 1 tablet (2.5 mg total) by mouth 2 (two) times daily. 180 tablet 3  . bisacodyl (DULCOLAX) 5 MG EC tablet Take 5 mg by mouth daily as needed for mild constipation or moderate constipation.    . calcium carbonate (TUMS EX) 750 MG chewable tablet Chew 2 tablets by mouth 2 (two) times daily.    . carvedilol (COREG) 6.25 MG tablet Take 6.25 mg by mouth 2 (two) times daily with a meal.    . cholecalciferol (VITAMIN D) 1000 UNITS tablet Take 1,000 Units by mouth daily.     . feeding supplement (BOOST / RESOURCE BREEZE) LIQD Take 1 Container by mouth 3 (three) times daily between meals.    . furosemide (LASIX) 20 MG tablet Take 2 tablets (40 mg total) by mouth daily. 30 tablet   . guaiFENesin (MUCINEX) 600 MG 12 hr tablet Take 600 mg by mouth 2 (two) times daily.    . isosorbide-hydrALAZINE (BIDIL) 20-37.5 MG tablet Take 1 tablet by mouth 2 (two) times daily.    Marland Kitchen  lisinopril (PRINIVIL,ZESTRIL) 10 MG tablet Take 1 tablet (10 mg total) by mouth daily.    Marland Kitchen LORazepam (ATIVAN) 0.5 MG tablet Take 0.25 mg by mouth at bedtime. Take 1/2 tab    . Melatonin 5 MG CAPS Take 1 capsule by mouth at bedtime.     . memantine (NAMENDA XR) 28 MG CP24 24 hr capsule Take 28 mg by mouth daily.    Marland Kitchen senna-docusate (SENOKOT-S) 8.6-50 MG per tablet Take 2 tablets by mouth at bedtime.     . simvastatin (ZOCOR) 20 MG tablet TAKE 1 TABLET BY MOUTH AT BEDTIME FOR CHOLESTEROL 90 tablet 1  . spironolactone (ALDACTONE) 25 MG tablet Take 0.5 tablets (12.5 mg total) by mouth daily. 30 tablet 1  . furosemide (LASIX) 40 MG tablet      No current facility-administered medications for this visit.     Past Medical History  Diagnosis Date  . Complete AV block ([place])   . Chronic systolic heart failure ([place])     a. Echo 4/16:  EF 25%, diff HK, Ao sclerosis without stenosis, mild AI, mild dilation of aortic root, mod MR, mod LAE, mildly reduced RVSF, PASP 52 mmHg  . Cardiomyopathy, ischemic     a. s/p STJ CRTD  . CAD (coronary artery disease)     s/p CABG  . Prostate cancer ([place])     S/P radiation rx  .  Osteoporosis     A/P Vertebral compression fx's  . Hypertension   . Dyslipidemia   . TIA (transient ischemic attack)        . Lumbago    . Cervicalgia    . Unspecified vitamin D deficiency    . Basal cell carcinoma of skin of lower limb, including hip    . Squamous cell carcinoma of skin of lower limb, including hip    . Impotence of organic origin    . Insomnia, unspecified    . Paroxysmal atrial fibrillation (HCC)      a. intol of coumadin - Rx with Aggrenox  . Fracture of C2 vertebra, closed ([place])     s/p cervical fusion 05/2013    ROS:   All systems reviewed and negative except as noted in the HPI.   Past Surgical History  Procedure Laterality Date  . Coronary artery bypass graft    . Hernia repair    . Kyphosis surgery    . Biv-icd  11/21/2010    [employer]  ICD Model#CD3231-40 [phone]  . Cardiac defibrillator placement    . Cyst excision  10/2012    back Dr. Wilhemina Rosales  . Cyst removal neck  12/2012    basel cell (right) Dr. Sarajane Rosales  . Colonoscopy  2006    polyps benign  . Dexa  April 2010  . Posterior cervical fusion/foraminotomy N/A 05/21/2013    Procedure: Cervical One, Cervical Two, Cervical Three Posterior cervical fusion with lateral mass fixation;  Surgeon: Jacob Pitter, MD;  Location: [facility];  Service: Neurosurgery;  Laterality: N/A;  POSTERIOR CERVICAL FUSION/FORAMINOTOMY LEVEL 2  . Back surgery    . Coronary angioplasty       Family History  Problem Relation Age of Onset  . Coronary artery disease Neg Hx   . Heart disease Mother      Social History   Social History  . Marital Status: Married    Spouse Name: N/A  . Number of Children: 1  . Years of Education: Masters   Occupational History  . Retired Chief Financial Officer    Social History Main Topics  . Smoking status: Never Smoker   . Smokeless tobacco: Never Used  . Alcohol Use: 1.2 oz/week    1 Glasses of wine, 1 Cans of beer per week  . Drug Use: No  . Sexual Activity: Not Currently   Other Topics Concern  . Not on file   Social History Narrative   Patient is Married Jacob Rosales), retired Chief Financial Officer.    Lives with spouse in single level home at [place] since 2010.   Smoked cigars, and pipe, stopped 1960's,  minimal alcohol history .   Patient has Advanced planning documents:  Living Will, DNR   Walks with cane   Occasional coffee and tea              BP 118/62 mmHg  Pulse 72  Ht 6\' 2"  (1.88 m)  Wt 126 lb 3.2 oz (57.244 kg)  BMI 16.20 kg/m2  SpO2 93%  Physical Exam:  Stable but frail appearing elderly man, NAD HEENT: Unremarkable Neck:  7 cm JVD, no thyromegally Lungs:  Clear with no wheezes, rales, or rhonchi. HEART:  Regular rate rhythm, no murmurs, no rubs, no clicks Abd:  soft, positive bowel sounds, no organomegally, no  rebound, no guarding Ext:  2 plus pulses, trace peripheral edema, no cyanosis, no clubbing Skin:  No rashes no nodules Neuro:  CN II  through XII intact, motor grossly intact   DEVICE  Normal device function.  See PaceArt for details.   Assess/Plan: 

## 2015-07-05 NOTE — Assessment & Plan Note (Signed)
His atrial fibrillation has become persistent. His ventricular rate is well controlled. He will continue his current medications.

## 2015-07-05 NOTE — Patient Instructions (Signed)
Your physician recommends that you continue on your current medications as directed. Please refer to the Current Medication list given to you today.  Remote monitoring is used to monitor your Pacemaker of ICD from home. This monitoring reduces the number of office visits required to check your device to one time per year. It allows [place] to keep an eye on the functioning of your device to ensure it is working properly. You are scheduled for a device check from home on 10/04/2015. You may send your transmission at any time that day. If you have a wireless device, the transmission will be sent automatically. After your physician reviews your transmission, you will receive a postcard with your next transmission date.  Your physician wants you to follow-up in: 1 year with Dr. Lovena Le.  You will receive a reminder letter in the mail two months in advance. If you don't receive a letter, please call our office to schedule the follow-up appointment.

## 2015-07-05 NOTE — Assessment & Plan Note (Signed)
His symptoms are class II. He will continue his current medications. He is encouraged to maintain a low-sodium diet.

## 2015-07-05 NOTE — Assessment & Plan Note (Signed)
His St. Jude biventricular device is working normally. He does have a device on recall. I've encouraged the patient to continue home monitoring. I am not inclined to recommend generator change out at the present time.

## 2015-07-05 NOTE — Assessment & Plan Note (Signed)
His blood pressure is well controlled. No change in medical therapy. 

## 2015-07-07 LAB — CUP PACEART INCLINIC DEVICE CHECK
Brady Statistic RV Percent Paced: 98 %
Date Time Interrogation Session: 20161102144827
HIGH POWER IMPEDANCE MEASURED VALUE: 40.8992
Implantable Lead Implant Date: 20070723
Implantable Lead Implant Date: 20070723
Implantable Lead Location: 753860
Implantable Lead Model: 7020
Lead Channel Impedance Value: 337.5 Ohm
Lead Channel Impedance Value: 350 Ohm
Lead Channel Impedance Value: 625 Ohm
Lead Channel Pacing Threshold Amplitude: 0.75 V
Lead Channel Pacing Threshold Amplitude: 0.75 V
Lead Channel Pacing Threshold Pulse Width: 1 ms
Lead Channel Pacing Threshold Pulse Width: 1 ms
Lead Channel Sensing Intrinsic Amplitude: 12 mV
Lead Channel Sensing Intrinsic Amplitude: 4.3 mV
Lead Channel Setting Pacing Amplitude: 2.5 V
Lead Channel Setting Pacing Pulse Width: 0.5 ms
MDC IDC LEAD IMPLANT DT: 20070917
MDC IDC LEAD LOCATION: 753858
MDC IDC LEAD LOCATION: 753859
MDC IDC MSMT BATTERY REMAINING LONGEVITY: 18
MDC IDC MSMT LEADCHNL RV PACING THRESHOLD AMPLITUDE: 1.25 V
MDC IDC MSMT LEADCHNL RV PACING THRESHOLD AMPLITUDE: 1.25 V
MDC IDC MSMT LEADCHNL RV PACING THRESHOLD PULSEWIDTH: 0.5 ms
MDC IDC MSMT LEADCHNL RV PACING THRESHOLD PULSEWIDTH: 0.5 ms
MDC IDC SET LEADCHNL LV PACING PULSEWIDTH: 1 ms
MDC IDC SET LEADCHNL RA PACING AMPLITUDE: 2 V
MDC IDC SET LEADCHNL RV PACING AMPLITUDE: 2.5 V
MDC IDC SET LEADCHNL RV SENSING SENSITIVITY: 2 mV
MDC IDC STAT BRADY RA PERCENT PACED: 16 %
Pulse Gen Serial Number: 631860

## 2015-07-17 ENCOUNTER — Telehealth: Payer: Self-pay | Admitting: Internal Medicine

## 2015-07-17 NOTE — Telephone Encounter (Signed)
New message     Wife states that a presc for bidil is too expensive.  Is there something else pt can switch to less expensive?

## 2015-07-17 NOTE — Telephone Encounter (Signed)
lmom that I would ask Dr Lovena Le if okay to switch to Isosorbide and Hydralazine.  Will call them back on Thurs

## 2015-07-18 ENCOUNTER — Telehealth: Payer: Self-pay

## 2015-07-18 NOTE — Telephone Encounter (Signed)
Received call from patient's wife.  She stated she received a certified letter for device recall and there was $2.80 postage due and she was upset.  She also had to pick the letter up at the post office and it was inconvenient for her.  She would to be reimbursed for the postage as well as gas money.  Advised I would investigate the situation and call her back.  She stated that would be fine.

## 2015-07-19 ENCOUNTER — Ambulatory Visit (INDEPENDENT_AMBULATORY_CARE_PROVIDER_SITE_OTHER): Payer: Medicare Other | Admitting: *Deleted

## 2015-07-19 DIAGNOSIS — I5033 Acute on chronic diastolic (congestive) heart failure: Secondary | ICD-10-CM

## 2015-07-19 DIAGNOSIS — Z95810 Presence of automatic (implantable) cardiac defibrillator: Secondary | ICD-10-CM

## 2015-07-19 NOTE — Telephone Encounter (Signed)
Attempted to call to wife and no answer.  Left message for return call.    Discussed with Everlean Patterson, office director that patients wife stated she paid $2.80 for postage due on certified letter for device recall.  Wife will be reimbursed for postage and offer of gift card for her inconveinence.

## 2015-07-20 NOTE — Telephone Encounter (Signed)
Spoke with wife

## 2015-07-20 NOTE — Progress Notes (Addendum)
EPIC Encounter for ICM Monitoring  Patient Name: STYLES HENNESSEE is a 81 y.o. male Date: 07/20/2015 Primary Care Physican: Hollace Kinnier, DO Primary Cardiologist: N/A Electrophysiologist: Lovena Le Dry Weight: Unknown in SNF       Bi-V Pacing 99%  In the past month, have you:  1. Gained more than 2 pounds in a day or more than 5 pounds in a week? Unknown  2. Had changes in your medications (with verification of current medications)? no  3. Had more shortness of breath than is usual for you? no  4. Limited your activity because of shortness of breath? no  5. Not been able to sleep because of shortness of breath? no  6. Had increased swelling in your feet or ankles? no  7. Had symptoms of dehydration (dizziness, dry mouth, increased thirst, decreased urine output) no  8. Had changes in sodium restriction? no  9. Been compliant with medication? Yes   ICM trend:  07/17/2015   Follow-up plan: ICM clinic phone appointment on 08/21/2015.  [place]vue daily impedance trending along baseline.  Wife stated patient is doing better and remains in SNF of [facility].  She lives in the independent part of the facility.  She denied he has any HF symptoms at this time.  She received the certified letter regarding St Jude device battery information and I confirmed the importance of continuing remote monitoring.  She stated she gave a copy of the letter to the staff at [facility] also.  No changes today.   Copy of note sent to patient's primary care physician, primary cardiologist, and device following physician.  Rosalene Billings, RN, CCM 07/20/2015 9:08 AM

## 2015-07-25 ENCOUNTER — Other Ambulatory Visit: Payer: Self-pay | Admitting: Internal Medicine

## 2015-07-26 ENCOUNTER — Telehealth: Payer: Self-pay | Admitting: Internal Medicine

## 2015-07-26 MED ORDER — HYDRALAZINE HCL 25 MG PO TABS: 25.0000 mg | ORAL_TABLET | Freq: Two times a day (BID) | ORAL | Status: DC

## 2015-07-26 MED ORDER — ISOSORBIDE MONONITRATE ER 30 MG PO TB24: 30.0000 mg | ORAL_TABLET | Freq: Every day | ORAL | Status: AC

## 2015-07-26 NOTE — Telephone Encounter (Signed)
Discussed with Dr Lovena Le and will call in Imdur 30mg  daily and Hydralazine 25 mg twice daily.  Wife aware

## 2015-07-26 NOTE — Telephone Encounter (Signed)
New message     Wife states that the presc for bidil is over 200.00 for 30 tablets.  She was told pt can get a presc for isosorbide and hydralazine and it would be cheaper.  Please call and let wife know if 2 separate prescriptions can be called in.

## 2015-07-30 NOTE — Telephone Encounter (Signed)
Ok to switch. GT 

## 2015-08-10 ENCOUNTER — Non-Acute Institutional Stay (SKILLED_NURSING_FACILITY): Payer: Medicare Other | Admitting: Adult Health

## 2015-08-10 DIAGNOSIS — G4700 Insomnia, unspecified: Secondary | ICD-10-CM | POA: Diagnosis not present

## 2015-08-10 DIAGNOSIS — I5022 Chronic systolic (congestive) heart failure: Secondary | ICD-10-CM | POA: Diagnosis not present

## 2015-08-10 DIAGNOSIS — F0151 Vascular dementia with behavioral disturbance: Secondary | ICD-10-CM

## 2015-08-10 DIAGNOSIS — I10 Essential (primary) hypertension: Secondary | ICD-10-CM | POA: Diagnosis not present

## 2015-08-10 DIAGNOSIS — R634 Abnormal weight loss: Secondary | ICD-10-CM | POA: Diagnosis not present

## 2015-08-10 DIAGNOSIS — R2681 Unsteadiness on feet: Secondary | ICD-10-CM

## 2015-08-10 NOTE — Progress Notes (Signed)
Patient ID: GIORGIO NAIL, male   DOB: [birth year], 81 y.o.   MRN: [id number]  Location:  [facility] Provider:  Cindi Carbon ANP  Code Status:  DNR, has most form     Chief Complaint  Patient presents with  . Medical Management of Chronic Issues    HPI:  81 y.o.male  residing at [facility], rehab section. He was originally admitted on 9/20 after a hospitalization for CHF. He is now awaiting a skilled bed due to his progressive dementia and dysphagia (difficulty following diet at home).  He has a hx of CHF, HTN, ICM, PPM, CAD, afib, vascular parkinsonism, constipation, and OP. I am here to review his chronic medical issues..  He has chronic CHF and his weights have trended upward by 3-4 lbs but he has also improved his intake. There are no reports of SOB or CP.  He has progressive dementia with hx of CVAs on brain imaging. His last cognitive test [facility]) on 05/25/15 was 11/30.   He was started on ativan (added to melatonin) to help with insomnia. In review of the notes it appears that he is getting up less at night.     Review of Systems:  Review of Systems  Constitutional: Negative for fever, chills and malaise/fatigue.  HENT: Positive for hearing loss. Negative for congestion.   Eyes: Negative for blurred vision.       Glasses  Respiratory: Negative for cough and shortness of breath.   Cardiovascular: Negative for chest pain and leg swelling.  Gastrointestinal: Negative for abdominal pain, constipation, blood in stool and melena.  Genitourinary: Negative for dysuria.  Musculoskeletal: Positive for falls.  Neurological: Positive for tremors. Negative for weakness.  Psychiatric/Behavioral: Positive for memory loss.    Past Medical History  Diagnosis Date  . Complete AV block ([place])   . Chronic systolic heart failure ([place])     a. Echo 4/16:  EF 25%, diff HK, Ao sclerosis without stenosis, mild AI, mild dilation of aortic root, mod MR, mod LAE, mildly  reduced RVSF, PASP 52 mmHg  . Cardiomyopathy, ischemic     a. s/p STJ CRTD  . CAD (coronary artery disease)     s/p CABG  . Prostate cancer ([place])     S/P radiation rx  . Osteoporosis     A/P Vertebral compression fx's  . Hypertension   . Dyslipidemia   . TIA (transient ischemic attack)        . Lumbago    . Cervicalgia    . Unspecified vitamin D deficiency    . Basal cell carcinoma of skin of lower limb, including hip    . Squamous cell carcinoma of skin of lower limb, including hip    . Impotence of organic origin    . Insomnia, unspecified    . Paroxysmal atrial fibrillation (HCC)      a. intol of coumadin - Rx with Aggrenox  . Fracture of C2 vertebra, closed ([place])     s/p cervical fusion 05/2013    Patient Active Problem List   Diagnosis Date Noted  . Acute on chronic diastolic CHF (congestive heart failure), NYHA class 1 ([place]) 05/21/2015  . CHF (congestive heart failure) ([place]) 05/21/2015  . UTI (lower urinary tract infection) 03/10/2015  . Acute cystitis without hematuria   . Insomnia 01/05/2015  . LFT elevation   . Acute on chronic systolic CHF (congestive heart failure), NYHA class 3 ([place]) 12/18/2014  . Unstable gait 07/25/2014  . Memory  loss 07/25/2014  . Prostate cancer ([place])   . Cognitive change 09/29/2013  . Loss of weight 07/05/2013  . Right foot drop 07/05/2013  . Essential hypertension 07/05/2013  . Generalized anxiety disorder 05/30/2013  . Unspecified constipation 05/28/2013  . Fracture of second cervical vertebra ([place]) 05/21/2013  . Osteoporosis with fracture 04/05/2013  . Dyslipidemia   . CAD (coronary artery disease)   . Advanced care planning/counseling discussion 01/14/2013  . Lumbago 10/05/2012  . Unspecified vitamin D deficiency 10/07/2011  . Ischemic cardiomyopathy 05/25/2009  . AV BLOCK, COMPLETE 05/25/2009  . Chronic systolic heart failure ([place]) 05/25/2009  . Biventricular automatic implantable cardioverter defibrillator in situ 05/25/2009   . PAF (paroxysmal atrial fibrillation) ([place]) 05/15/2006  . Unspecified urinary incontinence 05/15/2006    Allergies  Allergen Reactions  . Lisinopril Other (See Comments)    Acute renal failure  . Warfarin Sodium Other (See Comments)    REACTION: Stomach bleeding  . Amlodipine Besylate Swelling    Medications: Patient's Medications  New Prescriptions   No medications on file  Previous Medications   ACETAMINOPHEN (TYLENOL) 325 MG TABLET    Take 325 mg by mouth every 6 (six) hours as needed for mild pain or moderate pain.   APIXABAN (ELIQUIS) 2.5 MG TABS TABLET    Take 1 tablet (2.5 mg total) by mouth 2 (two) times daily.   BISACODYL (DULCOLAX) 5 MG EC TABLET    Take 5 mg by mouth daily as needed for mild constipation or moderate constipation.   CALCIUM CARBONATE (TUMS EX) 750 MG CHEWABLE TABLET    Chew 2 tablets by mouth 2 (two) times daily.   CARVEDILOL (COREG) 6.25 MG TABLET    Take 6.25 mg by mouth 2 (two) times daily with a meal.   CHOLECALCIFEROL (VITAMIN D) 1000 UNITS TABLET    Take 1,000 Units by mouth daily.    FEEDING SUPPLEMENT (BOOST / RESOURCE BREEZE) LIQD    Take 1 Container by mouth 3 (three) times daily between meals.   FUROSEMIDE (LASIX) 20 MG TABLET    Take 2 tablets (40 mg total) by mouth daily.   FUROSEMIDE (LASIX) 40 MG TABLET       GUAIFENESIN (MUCINEX) 600 MG 12 HR TABLET    Take 600 mg by mouth 2 (two) times daily.   HYDRALAZINE (APRESOLINE) 25 MG TABLET    Take 1 tablet (25 mg total) by mouth 2 (two) times daily.   ISOSORBIDE MONONITRATE (IMDUR) 30 MG 24 HR TABLET    Take 1 tablet (30 mg total) by mouth daily.   LISINOPRIL (PRINIVIL,ZESTRIL) 10 MG TABLET    Take 1 tablet (10 mg total) by mouth daily.   LORAZEPAM (ATIVAN) 0.5 MG TABLET    Take 0.25 mg by mouth at bedtime. Take 1/2 tab   MELATONIN 5 MG CAPS    Take 1 capsule by mouth at bedtime.    MEMANTINE (NAMENDA XR) 28 MG CP24 24 HR CAPSULE    Take 28 mg by mouth daily.   SENNA-DOCUSATE (SENOKOT-S) 8.6-50  MG PER TABLET    Take 2 tablets by mouth at bedtime.    SIMVASTATIN (ZOCOR) 20 MG TABLET    TAKE 1 TABLET BY MOUTH AT BEDTIME FOR CHOLESTEROL   SPIRONOLACTONE (ALDACTONE) 25 MG TABLET    Take 0.5 tablets (12.5 mg total) by mouth daily.  Modified Medications   No medications on file  Discontinued Medications   No medications on file    Physical Exam: Filed Vitals:  08/10/15 1021  BP: 125/85  Pulse: 75  Temp: 96.8 F (36 C)  Resp: 20  Weight: 144 lb 12.8 oz (65.681 kg)  SpO2: 94%   Body mass index is 18.58 kg/(m^2). Wt Readings from Last 3 Encounters:  08/10/15 144 lb 12.8 oz (65.681 kg)  07/05/15 126 lb 3.2 oz (57.244 kg)  06/23/15 140 lb (63.504 kg)    Physical Exam  Constitutional: No distress.  Tall frail white male seated in recliner in his room  HENT:  Head: Normocephalic and atraumatic.  Cardiovascular: Normal heart sounds and intact distal pulses.   irreg irreg  Pulmonary/Chest: Effort normal and breath sounds normal. He has no wheezes. He has no rales.  Abdominal: Soft. Bowel sounds are normal. He exhibits no distension. There is no tenderness.  Musculoskeletal:  Unsteady shuffling gait and some rigidity of left more than right arm and flat affect  Neurological: He is alert.  Oriented to person and place, not precise time  Skin: Skin is warm and dry.  Psychiatric:  Flat affect; some hypophonia also    Labs reviewed: Basic Metabolic Panel:  Recent Labs  12/19/14 0125  05/22/15 0109 05/23/15 0117 05/30/15 06/07/15 1326  NA 141  < > 140 139 139 137  K 4.1  < > 4.0 3.5 4.1 4.3  CL 106  < > 101 102  --  103  CO2 23  < > 30 25  --  24  GLUCOSE 117*  < > 109* 96  --  99  BUN 48*  < > 35* 38* 38* 30*  CREATININE 1.86*  < > 1.91* 1.89* 1.4* 1.38*  CALCIUM 9.2  < > 9.1 8.8*  --  9.0  MG 2.2  --   --   --   --   --   < > = values in this interval not displayed.  Liver Function Tests:  Recent Labs  12/19/14 1923 12/21/14 0736 12/29/14 03/10/15 1807    AST 199* 106* 27 48*  ALT 257* 199* 38 51  ALKPHOS 65 63 42 70  BILITOT 2.1* 1.9*  --  1.2  PROT 6.5 6.6  --  6.9  ALBUMIN 3.7 3.6  --  3.8    CBC:  Recent Labs  12/18/14 1411  03/10/15 1807 03/11/15 0425 03/21/15 05/18/15 1616 05/21/15 0800  WBC 5.7  < > 8.7 6.7 4.7 5.3 5.2  NEUTROABS 4.5  --  7.5  --   --  4.1  --   HGB 13.3  < > 13.5 13.1 12.8* 13.3 11.7*  HCT 40.1  < > 40.5 40.7 38* 41.0 36.8*  MCV 96.9  < > 95.5 94.9  --  96.7 96.8  PLT 108*  < > 151 144* 187 125.0* 113*  < > = values in this interval not displayed.  Lab Results  Component Value Date   TSH 1.81 04/27/2014   Lab Results  Component Value Date   HGBA1C * 01/13/2010    5.7 (NOTE)                                                                       According to the ADA Clinical Practice Recommendations for 2011, when HbA1c is used as a screening test:   >=  6.5%   Diagnostic of Diabetes Mellitus           (if abnormal result  is confirmed)  5.7-6.4%   Increased risk of developing Diabetes Mellitus  References:Diagnosis and Classification of Diabetes Mellitus,Diabetes [id number] 1):S62-S69 and Standards of Medical Care in         Diabetes - 2011,Diabetes Care,2011,34  (Suppl 1):S11-S61.   Lab Results  Component Value Date   CHOL 120 07/05/2014   HDL 49 07/05/2014   LDLCALC 63 07/05/2014   TRIG 76 07/05/2014   CHOLHDL 2.3 01/13/2010    Significant Diagnostic Results reviewed since last visit:  03/10/15:  CXR:  Cardiomegaly with pulmonary vascular congestion and possible mildinterstitial pulmonary edema. New small right pleural effusion. 03/11/15:  CXR:  Cardiomegaly with mild interstitial edema and small right pleural effusion. 03/20/15:  CXR:  Improving CHF with near total resolution of the small right pleural effusion. Patient Care Team: Gayland Curry, DO as PCP - General (Geriatric Medicine) Jarome Matin, MD as Consulting Physician (Dermatology) Evans Lance, MD as Consulting Physician  (Cardiology) Well Ashley, MD as Consulting Physician (Neurosurgery) Bjorn Loser, MD as Consulting Physician (Urology)  Assessment/Plan  1. Vascular dementia -progressive, resident still awaiting skilled bed -improved night tie behaviors. -continue namenda  2. Loss of weight -improved with stay in rehab  3. Insomnia -stable, continue melatonin and ativan  4. Unstable gait -has completed therapy -uses walker with one person assistance and WC for longer distances -has vascular parkinsonism per notes contributing to this issue  5. Chronic systolic heart failure (HCC) -stable with no edema or crackles -continue current meds  6. Essential hypertension -controlled -monitor BMP periodically   Cindi Carbon, [place] [phone]

## 2015-08-21 ENCOUNTER — Telehealth: Payer: Self-pay | Admitting: Cardiology

## 2015-08-21 ENCOUNTER — Telehealth: Payer: Self-pay

## 2015-08-21 NOTE — Telephone Encounter (Signed)
Returned call to [place] at [facility].  Explained the transmission was received.  She asked if the monitor needed to be plugged in at all time and I stated yes, for as long as he is monitored.  I asked if patient was DNR and she stated yes.    Received voice mail message from [place] at [facility].  She requested a call back and unsure if a transmission was received.  She left her number [phone].

## 2015-08-21 NOTE — Telephone Encounter (Signed)
Confirmed remote transmission w/ pt wife.   

## 2015-08-23 ENCOUNTER — Telehealth: Payer: Self-pay

## 2015-08-23 NOTE — Telephone Encounter (Signed)
ICM transmission received.  Attempted call to wife.  Left message for return call.

## 2015-09-01 NOTE — Telephone Encounter (Signed)
Attempted ICM call and no message.  Next remote scheduled for 10/04/2015.  08/28/2015 impedance showing fluid levels appear to be balanced. Patient letter sent with new transmission date.   ICM Trend: 08/28/2015

## 2015-09-07 ENCOUNTER — Ambulatory Visit (INDEPENDENT_AMBULATORY_CARE_PROVIDER_SITE_OTHER): Payer: Medicare Other | Admitting: *Deleted

## 2015-09-07 DIAGNOSIS — Z95810 Presence of automatic (implantable) cardiac defibrillator: Secondary | ICD-10-CM

## 2015-09-07 DIAGNOSIS — I5033 Acute on chronic diastolic (congestive) heart failure: Secondary | ICD-10-CM

## 2015-09-07 DIAGNOSIS — I255 Ischemic cardiomyopathy: Secondary | ICD-10-CM | POA: Diagnosis not present

## 2015-09-08 ENCOUNTER — Non-Acute Institutional Stay (SKILLED_NURSING_FACILITY): Payer: Medicare Other | Admitting: Adult Health

## 2015-09-08 ENCOUNTER — Encounter: Payer: Self-pay | Admitting: Adult Health

## 2015-09-08 DIAGNOSIS — G4700 Insomnia, unspecified: Secondary | ICD-10-CM | POA: Diagnosis not present

## 2015-09-08 DIAGNOSIS — F0151 Vascular dementia with behavioral disturbance: Secondary | ICD-10-CM | POA: Diagnosis not present

## 2015-09-08 DIAGNOSIS — I481 Persistent atrial fibrillation: Secondary | ICD-10-CM | POA: Diagnosis not present

## 2015-09-08 DIAGNOSIS — I5022 Chronic systolic (congestive) heart failure: Secondary | ICD-10-CM | POA: Diagnosis not present

## 2015-09-08 DIAGNOSIS — M8080XD Other osteoporosis with current pathological fracture, unspecified site, subsequent encounter for fracture with routine healing: Secondary | ICD-10-CM | POA: Diagnosis not present

## 2015-09-08 DIAGNOSIS — I4819 Other persistent atrial fibrillation: Secondary | ICD-10-CM

## 2015-09-08 NOTE — Progress Notes (Signed)
EPIC Encounter for ICM Monitoring  Patient Name: Jacob Rosales is a 82 y.o. male Date: 09/08/2015 Primary Care Physican: Hollace Kinnier, DO Primary Cardiologist: Lovena Le Electrophysiologist: Lovena Le Dry Weight: 147 lbs per last office appointment with Jacob Hawthorn, NP       In the past month, have you:  1. Gained more than 2 pounds in a day or more than 5 pounds in a week? no  2. Had changes in your medications (with verification of current medications)? no  3. Had more shortness of breath than is usual for you? no  4. Limited your activity because of shortness of breath? no  5. Not been able to sleep because of shortness of breath? no  6. Had increased swelling in your feet or ankles? no  7. Had symptoms of dehydration (dizziness, dry mouth, increased thirst, decreased urine output) no  8. Had changes in sodium restriction? no  9. Been compliant with medication? Yes   ICM trend: 09/07/2015  ICM Trend:  09/07/2015   Follow-up plan: ICM clinic phone appointment on 10/11/2015.  Spoke with wife.  [place] impedance below baseline ~09/03/2015 to 09/05/2015.suggesting fluid retention and back to baseline 09/06/2015.  She stated he has stablized and denied he has had any HF symptoms.  She stated they are waiting on a permanent placement in SNF due to she is no longer able to care for him at home due to his medical conditions and dementia.  Encouraged her to call should he have any fluid symptoms.  No changes today.    Copy of note sent to patient's primary care physician, primary cardiologist, and device following physician.  Rosalene Billings, RN, CCM 09/08/2015 2:57 PM

## 2015-09-08 NOTE — Progress Notes (Signed)
Remote ICD transmission.   

## 2015-09-08 NOTE — Progress Notes (Addendum)
Patient ID: Jacob Rosales, male   DOB: [birth year], 82 y.o.   MRN: [id number]  Location:  [facility] Provider:  Cindi Carbon ANP  Code Status:  DNR, has most form     Chief Complaint  Patient presents with  . Medical Management of Chronic Issues    HPI:  82 y.o.male  residing at [facility], rehab section. He was originally admitted on 9/20 after a hospitalization for CHF. He is now awaiting a skilled bed due to his progressive dementia and dysphagia (difficulty following diet at home).  He has a hx of CHF, HTN, ICM, PPM, CAD, afib, vascular parkinsonism, constipation, and OP. I am here to review his chronic medical issues..  He has chronic systolic CHF and his weights have trended upward by 3lbs from last month, but overall has no SOB or DOE, CP, or edema. Notes from cardiology indicate that he is now persistently in afib. Currently he is on eliquis but the staff reported that this is expensive for him and his wife is wanting it changed.  He has progressive dementia with hx of CVAs on brain imaging. His last cognitive test [facility]) on 05/25/15 was 11/30.   Resident has some anxiety at night and currently is using ativan and melatonin and this seems to help. BP was running low and he was having some dizziness and on 12/9 hydralazine was decreased to 25 mg qd.  BP have improved ranging 105-125/68-77. He is on a complex regimen and has an EF of 25 % with diffuse hypokinesis, moderate MR, dilated left atrium     Review of Systems:  Review of Systems  Constitutional: Negative for fever, chills and malaise/fatigue.  HENT: Positive for hearing loss. Negative for congestion.   Eyes: Negative for blurred vision.       Glasses  Respiratory: Negative for cough and shortness of breath.   Cardiovascular: Negative for chest pain, palpitations, orthopnea, leg swelling and PND.  Gastrointestinal: Negative for abdominal pain, constipation, blood in stool and melena.    Genitourinary: Negative for dysuria.  Musculoskeletal: Positive for falls.  Neurological: Positive for tremors. Negative for weakness.  Psychiatric/Behavioral: Positive for memory loss.    Past Medical History  Diagnosis Date  . Complete AV block ([place])   . Chronic systolic heart failure ([place])     a. Echo 4/16:  EF 25%, diff HK, Ao sclerosis without stenosis, mild AI, mild dilation of aortic root, mod MR, mod LAE, mildly reduced RVSF, PASP 52 mmHg  . Cardiomyopathy, ischemic     a. s/p STJ CRTD  . CAD (coronary artery disease)     s/p CABG  . Prostate cancer ([place])     S/P radiation rx  . Osteoporosis     A/P Vertebral compression fx's  . Hypertension   . Dyslipidemia   . TIA (transient ischemic attack)        . Lumbago    . Cervicalgia    . Unspecified vitamin D deficiency    . Basal cell carcinoma of skin of lower limb, including hip    . Squamous cell carcinoma of skin of lower limb, including hip    . Impotence of organic origin    . Insomnia, unspecified    . Paroxysmal atrial fibrillation (HCC)      a. intol of coumadin - Rx with Aggrenox  . Fracture of C2 vertebra, closed ([place])     s/p cervical fusion 05/2013    Patient Active Problem List  Diagnosis Date Noted  . Acute on chronic diastolic CHF (congestive heart failure), NYHA class 1 ([place]) 05/21/2015  . CHF (congestive heart failure) ([place]) 05/21/2015  . UTI (lower urinary tract infection) 03/10/2015  . Acute cystitis without hematuria   . Insomnia 01/05/2015  . LFT elevation   . Acute on chronic systolic CHF (congestive heart failure), NYHA class 3 ([place]) 12/18/2014  . Unstable gait 07/25/2014  . Memory loss 07/25/2014  . Prostate cancer ([place])   . Cognitive change 09/29/2013  . Loss of weight 07/05/2013  . Right foot drop 07/05/2013  . Essential hypertension 07/05/2013  . Generalized anxiety disorder 05/30/2013  . Unspecified constipation 05/28/2013  . Fracture of second cervical vertebra ([place]) 05/21/2013   . Osteoporosis with fracture 04/05/2013  . Dyslipidemia   . CAD (coronary artery disease)   . Advanced care planning/counseling discussion 01/14/2013  . Lumbago 10/05/2012  . Unspecified vitamin D deficiency 10/07/2011  . Ischemic cardiomyopathy 05/25/2009  . AV BLOCK, COMPLETE 05/25/2009  . Chronic systolic heart failure ([place]) 05/25/2009  . Biventricular automatic implantable cardioverter defibrillator in situ 05/25/2009  . PAF (paroxysmal atrial fibrillation) ([place]) 05/15/2006  . Unspecified urinary incontinence 05/15/2006    Allergies  Allergen Reactions  . Lisinopril Other (See Comments)    Acute renal failure  . Warfarin Sodium Other (See Comments)    REACTION: Stomach bleeding  . Amlodipine Besylate Swelling    Medications: Patient's Medications  New Prescriptions   No medications on file  Previous Medications   ACETAMINOPHEN (TYLENOL) 325 MG TABLET    Take 325 mg by mouth every 6 (six) hours as needed for mild pain or moderate pain.   APIXABAN (ELIQUIS) 2.5 MG TABS TABLET    Take 1 tablet (2.5 mg total) by mouth 2 (two) times daily.   BISACODYL (DULCOLAX) 5 MG EC TABLET    Take 5 mg by mouth daily as needed for mild constipation or moderate constipation.   CALCIUM CARBONATE (TUMS EX) 750 MG CHEWABLE TABLET    Chew 2 tablets by mouth 2 (two) times daily.   CARVEDILOL (COREG) 6.25 MG TABLET    Take 6.25 mg by mouth 2 (two) times daily with a meal.   CHOLECALCIFEROL (VITAMIN D) 1000 UNITS TABLET    Take 1,000 Units by mouth daily.    FEEDING SUPPLEMENT (BOOST / RESOURCE BREEZE) LIQD    Take 1 Container by mouth 3 (three) times daily between meals.   FUROSEMIDE (LASIX) 20 MG TABLET    Take 2 tablets (40 mg total) by mouth daily.   FUROSEMIDE (LASIX) 40 MG TABLET       GUAIFENESIN (MUCINEX) 600 MG 12 HR TABLET    Take 600 mg by mouth 2 (two) times daily.   HYDRALAZINE (APRESOLINE) 25 MG TABLET    Take 1 tablet (25 mg total) by mouth 2 (two) times daily.   ISOSORBIDE  MONONITRATE (IMDUR) 30 MG 24 HR TABLET    Take 1 tablet (30 mg total) by mouth daily.   LISINOPRIL (PRINIVIL,ZESTRIL) 10 MG TABLET    Take 1 tablet (10 mg total) by mouth daily.   LORAZEPAM (ATIVAN) 0.5 MG TABLET    Take 0.25 mg by mouth at bedtime. Take 1/2 tab   MELATONIN 5 MG CAPS    Take 1 capsule by mouth at bedtime.    MEMANTINE (NAMENDA XR) 28 MG CP24 24 HR CAPSULE    Take 28 mg by mouth daily.   SENNA-DOCUSATE (SENOKOT-S) 8.6-50 MG PER TABLET  Take 2 tablets by mouth at bedtime.    SIMVASTATIN (ZOCOR) 20 MG TABLET    TAKE 1 TABLET BY MOUTH AT BEDTIME FOR CHOLESTEROL   SPIRONOLACTONE (ALDACTONE) 25 MG TABLET    Take 0.5 tablets (12.5 mg total) by mouth daily.  Modified Medications   No medications on file  Discontinued Medications   No medications on file    Physical Exam: Filed Vitals:   09/08/15 1141  BP: 123/77  Pulse: 76  Temp: 96.7 F (35.9 C)  Resp: 20  Weight: 147 lb 6.4 oz (66.86 kg)  SpO2: 94%   Body mass index is 18.92 kg/(m^2). Wt Readings from Last 3 Encounters:  09/08/15 147 lb 6.4 oz (66.86 kg)  08/10/15 144 lb 12.8 oz (65.681 kg)  07/05/15 126 lb 3.2 oz (57.244 kg)    Physical Exam  Constitutional: No distress.  Tall frail white male seated in recliner in his room  HENT:  Head: Normocephalic and atraumatic.  Cardiovascular: Normal heart sounds and intact distal pulses.   No edema, irreg irreg  Pulmonary/Chest: Effort normal and breath sounds normal. He has no wheezes. He has no rales.  Abdominal: Soft. Bowel sounds are normal. He exhibits no distension. There is no tenderness.  Musculoskeletal:  Unsteady shuffling gait and some rigidity of left more than right arm and flat affect  Neurological: He is alert.  Oriented to person and place, not precise time  Skin: Skin is warm and dry.  Psychiatric:  Flat affect; some hypophonia also    Labs reviewed: Basic Metabolic Panel:  Recent Labs  12/19/14 0125  05/22/15 0109 05/23/15 0117 05/30/15  06/07/15 1326 06/30/15  NA 141  < > 140 139 139 137 137  K 4.1  < > 4.0 3.5 4.1 4.3 4.4  CL 106  < > 101 102  --  103  --   CO2 23  < > 30 25  --  24  --   GLUCOSE 117*  < > 109* 96  --  99  --   BUN 48*  < > 35* 38* 38* 30* 38*  CREATININE 1.86*  < > 1.91* 1.89* 1.4* 1.38* 1.4*  CALCIUM 9.2  < > 9.1 8.8*  --  9.0  --   MG 2.2  --   --   --   --   --   --   < > = values in this interval not displayed.  Liver Function Tests:  Recent Labs  12/19/14 1923 12/21/14 0736 12/29/14 03/10/15 1807 06/30/15  AST 199* 106* 27 48* 18  ALT 257* 199* 38 51 19  ALKPHOS 65 63 42 70 64  BILITOT 2.1* 1.9*  --  1.2  --   PROT 6.5 6.6  --  6.9  --   ALBUMIN 3.7 3.6  --  3.8  --     CBC:  Recent Labs  12/18/14 1411  03/10/15 1807 03/11/15 0425 03/21/15 05/18/15 1616 05/21/15 0800  WBC 5.7  < > 8.7 6.7 4.7 5.3 5.2  NEUTROABS 4.5  --  7.5  --   --  4.1  --   HGB 13.3  < > 13.5 13.1 12.8* 13.3 11.7*  HCT 40.1  < > 40.5 40.7 38* 41.0 36.8*  MCV 96.9  < > 95.5 94.9  --  96.7 96.8  PLT 108*  < > 151 144* 187 125.0* 113*  < > = values in this interval not displayed.  Lab Results  Component Value Date  TSH 1.81 04/27/2014   Lab Results  Component Value Date   HGBA1C * 01/13/2010    5.7 (NOTE)                                                                       According to the ADA Clinical Practice Recommendations for 2011, when HbA1c is used as a screening test:   >=6.5%   Diagnostic of Diabetes Mellitus           (if abnormal result  is confirmed)  5.7-6.4%   Increased risk of developing Diabetes Mellitus  References:Diagnosis and Classification of Diabetes Mellitus,Diabetes [id number] 1):S62-S69 and Standards of Medical Care in         Diabetes - 2011,Diabetes Care,2011,34  (Suppl 1):S11-S61.   Lab Results  Component Value Date   CHOL 96 06/30/2015   HDL 35 06/30/2015   LDLCALC 49 06/30/2015   TRIG 61 06/30/2015   CHOLHDL 2.3 01/13/2010    Significant Diagnostic  Results reviewed since last visit:  03/10/15:  CXR:  Cardiomegaly with pulmonary vascular congestion and possible mildinterstitial pulmonary edema. New small right pleural effusion. 03/11/15:  CXR:  Cardiomegaly with mild interstitial edema and small right pleural effusion. 03/20/15:  CXR:  Improving CHF with near total resolution of the small right pleural effusion.  2D echo 12/19/14 Study Conclusions  - Left ventricle: Apex moves a little better than the other walls.   The cavity size was moderately dilated. The estimated ejection   fraction was 25%. Diffuse hypokinesis. - Aortic valve: Sclerosis without stenosis. There was mild   regurgitation. - Aorta: Mild dilitation of the aortic root. - Mitral valve: There was moderate regurgitation. - Left atrium: The atrium was moderately dilated. - Right ventricle: The cavity size was mildly dilated. Pacer wire   or catheter noted in right ventricle. Systolic function was   mildly reduced. - Pulmonary arteries: PA peak pressure: 52 mm Hg (S).  Assessment/Plan  1. Persistent atrial fibrillation (HCC) -rate controlled with current regimen -resident taking eliquis for CVA risk reduction but there are issues with cost, staff at [facility] to obtain prior auth form for med  2. Chronic systolic heart failure (HCC) -remains stable on current regimen with minimal weight gain and no symptoms unless exerted -continue lasix, aldactone, hydralazine, lisinopril, and imdur managed by cards  3. Osteoporosis with fracture, with routine healing, subsequent encounter -denies pain, no recent fracture -has completed fosamax, no on Ca with Vit D -would not obtain dexa at this point given his progressive dementia  4. Vascular dementia, with behavioral disturbance -stable functional and cognitive status  -awaiting skilled bed due to assistance needed with ADL's and frequent reminders -continue namenda, and ativan  5. Insomnia -improved with ativan and  melatonin  Labs CBC, BMP  Cindi Carbon, ANP [facility] [phone]

## 2015-09-12 ENCOUNTER — Other Ambulatory Visit: Payer: Self-pay | Admitting: Internal Medicine

## 2015-09-12 MED ORDER — RIVAROXABAN 15 MG PO TABS: 15.0000 mg | ORAL_TABLET | Freq: Every day | ORAL | Status: AC

## 2015-09-21 ENCOUNTER — Non-Acute Institutional Stay (SKILLED_NURSING_FACILITY): Payer: Medicare Other | Admitting: Adult Health

## 2015-09-21 ENCOUNTER — Encounter: Payer: Self-pay | Admitting: Adult Health

## 2015-09-21 DIAGNOSIS — G4700 Insomnia, unspecified: Secondary | ICD-10-CM | POA: Diagnosis not present

## 2015-09-21 DIAGNOSIS — F01518 Vascular dementia, unspecified severity, with other behavioral disturbance: Secondary | ICD-10-CM

## 2015-09-21 DIAGNOSIS — F0151 Vascular dementia with behavioral disturbance: Secondary | ICD-10-CM
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
## 2015-09-26 LAB — CUP PACEART REMOTE DEVICE CHECK
Battery Remaining Longevity: 16 mo
Brady Statistic AP VP Percent: 61 %
Brady Statistic AS VP Percent: 39 %
Brady Statistic RA Percent Paced: 1 %
HighPow Impedance: 42 Ohm
Implantable Lead Implant Date: 20070723
Implantable Lead Implant Date: 20070723
Implantable Lead Implant Date: 20070917
Implantable Lead Location: 753858
Implantable Lead Model: 7020
Lead Channel Impedance Value: 310 Ohm
Lead Channel Impedance Value: 340 Ohm
Lead Channel Pacing Threshold Amplitude: 0.75 V
Lead Channel Pacing Threshold Pulse Width: 0.5 ms
Lead Channel Pacing Threshold Pulse Width: 0.8 ms
Lead Channel Pacing Threshold Pulse Width: 1 ms
Lead Channel Setting Pacing Amplitude: 2 V
Lead Channel Setting Pacing Amplitude: 2.5 V
Lead Channel Setting Pacing Amplitude: 2.5 V
Lead Channel Setting Pacing Pulse Width: 0.5 ms
Lead Channel Setting Pacing Pulse Width: 1 ms
MDC IDC LEAD LOCATION: 753859
MDC IDC LEAD LOCATION: 753860
MDC IDC MSMT BATTERY REMAINING PERCENTAGE: 22 %
MDC IDC MSMT BATTERY VOLTAGE: 2.78 V
MDC IDC MSMT LEADCHNL LV IMPEDANCE VALUE: 600 Ohm
MDC IDC MSMT LEADCHNL LV PACING THRESHOLD AMPLITUDE: 0.75 V
MDC IDC MSMT LEADCHNL RA SENSING INTR AMPL: 4.3 mV
MDC IDC MSMT LEADCHNL RV PACING THRESHOLD AMPLITUDE: 1.25 V
MDC IDC MSMT LEADCHNL RV SENSING INTR AMPL: 11.8 mV
MDC IDC PG SERIAL: 631860
MDC IDC SESS DTM: 20170105080748
MDC IDC SET LEADCHNL RV SENSING SENSITIVITY: 2 mV
MDC IDC STAT BRADY AP VS PERCENT: 0 %
MDC IDC STAT BRADY AS VS PERCENT: 0 %

## 2015-09-28 LAB — CBC AND DIFFERENTIAL
HCT: 41 % (ref 41–53)
Hemoglobin: 13.3 g/dL — AB (ref 13.5–17.5)
Platelets: 165 10*3/uL (ref 150–399)
WBC: 6.4 10^3/mL

## 2015-09-28 LAB — BASIC METABOLIC PANEL
BUN: 39 mg/dL — AB (ref 4–21)
CREATININE: 1.8 mg/dL — AB (ref <=1.3)
GLUCOSE: 102 mg/dL
POTASSIUM: 5 mmol/L (ref 3.4–5.3)
SODIUM: 143 mmol/L (ref 137–147)

## 2015-09-29 ENCOUNTER — Encounter: Payer: Self-pay | Admitting: Cardiology

## 2015-09-29 ENCOUNTER — Non-Acute Institutional Stay (SKILLED_NURSING_FACILITY): Payer: Medicare Other | Admitting: Adult Health

## 2015-09-29 ENCOUNTER — Encounter: Payer: Self-pay | Admitting: Adult Health

## 2015-09-29 DIAGNOSIS — N184 Chronic kidney disease, stage 4 (severe): Secondary | ICD-10-CM | POA: Diagnosis not present

## 2015-09-29 DIAGNOSIS — I5023 Acute on chronic systolic (congestive) heart failure: Secondary | ICD-10-CM | POA: Diagnosis not present

## 2015-09-29 NOTE — Progress Notes (Signed)
Patient ID: Jacob Rosales, male   DOB: Sep 23, 1927, 82 y.o.   MRN: [id number]     Nursing Home Location:  [place]   Code Status: DNR  Patient Care Team: Gayland Curry, DO as PCP - General (Geriatric Medicine) Jarome Matin, MD as Consulting Physician (Dermatology) Evans Lance, MD as Consulting Physician (Cardiology) [facility] Earnie Larsson, MD as Consulting Physician (Neurosurgery) Bjorn Loser, MD as Consulting Physician (Urology)   Place of Service: SNF (31)  Chief Complaint  Patient presents with  . Acute Visit    increased work of breathing    HPI:  82 y.o. male  residing at [facility], skilled section. He has a hx of CVA, pacemaker with complete heart block, CHF, dysphagia,  and vascular dementia. The staff asked me to see him due to increased work of breathing and frequency.  His sats were 97% and he is afebrile. He is minimally ambulatory and spends most of his day in the chair. Due to his dementia he can not add much to the history but does endorse feeling mildly short of breath. He denies any difficulty urinating. He has had several CHF exacerbations in the past, most recent echo in April of 2016 shows  Study Conclusions  - Left ventricle: Apex moves a little better than the other walls. The cavity size was moderately dilated. The estimated ejection fraction was 25%. Diffuse hypokinesis. - Aortic valve: Sclerosis without stenosis. There was mild regurgitation. - Aorta: Mild dilitation of the aortic root. - Mitral valve: There was moderate regurgitation. - Left atrium: The atrium was moderately dilated. - Right ventricle: The cavity size was mildly dilated. Pacer wire or catheter noted in right ventricle. Systolic function was mildly reduced. - Pulmonary arteries: PA peak pressure: 52 mm Hg (S).  His weight has remained stable at 146 lbs and has no peripheral edema. He also has a  congested cough with clear mucus production that is chronic.  CXR on 09/28/15 showed mild CHF and peribronchial thickening with no focal infiltrate. His BNP was 1713.     Review of Systems:  Review of Systems  Unable to perform ROS: Dementia    Medications: Patient's Medications  New Prescriptions   No medications on file  Previous Medications   ACETAMINOPHEN (TYLENOL) 325 MG TABLET    Take 325 mg by mouth every 6 (six) hours as needed for mild pain or moderate pain.   BISACODYL (DULCOLAX) 5 MG EC TABLET    Take 5 mg by mouth daily as needed for mild constipation or moderate constipation.   CALCIUM CARBONATE (TUMS EX) 750 MG CHEWABLE TABLET    Chew 2 tablets by mouth 2 (two) times daily.   CARVEDILOL (COREG) 6.25 MG TABLET    Take 6.25 mg by mouth 2 (two) times daily with a meal.   CHOLECALCIFEROL (VITAMIN D) 1000 UNITS TABLET    Take 1,000 Units by mouth daily.    FEEDING SUPPLEMENT (BOOST / RESOURCE BREEZE) LIQD    Take 1 Container by mouth 3 (three) times daily between meals.   FUROSEMIDE (LASIX) 40 MG TABLET       GUAIFENESIN (MUCINEX) 600 MG 12 HR TABLET    Take 600 mg by mouth 2 (two) times daily.   HYDRALAZINE (APRESOLINE) 25 MG TABLET    Take 1 tablet (25 mg total) by mouth 2 (two) times daily.   ISOSORBIDE MONONITRATE (IMDUR) 30 MG 24 HR TABLET    Take 1 tablet (30  mg total) by mouth daily.   LISINOPRIL (PRINIVIL,ZESTRIL) 10 MG TABLET    Take 1 tablet (10 mg total) by mouth daily.   LORAZEPAM (ATIVAN) 0.5 MG TABLET    Take 0.25 mg by mouth at bedtime. Take 1/2 tab   MELATONIN 5 MG CAPS    Take 1 capsule by mouth at bedtime.    MEMANTINE (NAMENDA XR) 28 MG CP24 24 HR CAPSULE    Take 28 mg by mouth daily.   RIVAROXABAN (XARELTO) 15 MG TABS TABLET    Take 1 tablet (15 mg total) by mouth daily with supper.   SENNA-DOCUSATE (SENOKOT-S) 8.6-50 MG PER TABLET    Take 2 tablets by mouth at bedtime.    SIMVASTATIN (ZOCOR) 20 MG TABLET    TAKE 1 TABLET BY MOUTH AT BEDTIME FOR CHOLESTEROL    SPIRONOLACTONE (ALDACTONE) 25 MG TABLET    Take 0.5 tablets (12.5 mg total) by mouth daily.  Modified Medications   No medications on file  Discontinued Medications   No medications on file     Physical Exam:  Filed Vitals:   09/29/15 0948  BP: 142/91  Pulse: 68  Temp: 97.7 F (36.5 C)  Resp: 24  Weight: 146 lb 6.4 oz (66.407 kg)  SpO2: 97%    Physical Exam  Constitutional: No distress.  HENT:  Head: Normocephalic and atraumatic.  Mouth/Throat: Oropharynx is clear and moist. No oropharyngeal exudate.  Eyes: Conjunctivae and EOM are normal. Pupils are equal, round, and reactive to light. Right eye exhibits no discharge. Left eye exhibits no discharge.  Neck: JVD present. No tracheal deviation present. No thyromegaly present.  Cardiovascular: Normal rate.   No edema  Pulmonary/Chest: He has rales (bibasilar).  Slight increased WOB  Abdominal: Soft. Bowel sounds are normal. He exhibits no distension.  No s/p tenderness  Lymphadenopathy:    He has no cervical adenopathy.  Neurological: He is alert.  Oriented x 2, able to f/c  Skin: Skin is warm and dry. He is not diaphoretic.  Psychiatric: Affect normal.    Wt Readings from Last 3 Encounters:  09/29/15 146 lb 6.4 oz (66.407 kg)  09/21/15 151 lb 9.6 oz (68.765 kg)  09/08/15 147 lb 6.4 oz (66.86 kg)     Labs reviewed/Significant Diagnostic Results:  Basic Metabolic Panel:  Recent Labs  12/19/14 0125  05/22/15 0109 05/23/15 0117  06/07/15 1326 06/30/15 09/28/15  NA 141  < > 140 139  < > 137 137 143  K 4.1  < > 4.0 3.5  < > 4.3 4.4 5.0  CL 106  < > 101 102  --  103  --   --   CO2 23  < > 30 25  --  24  --   --   GLUCOSE 117*  < > 109* 96  --  99  --   --   BUN 48*  < > 35* 38*  < > 30* 38* 39*  CREATININE 1.86*  < > 1.91* 1.89*  < > 1.38* 1.4* 1.8*  CALCIUM 9.2  < > 9.1 8.8*  --  9.0  --   --   MG 2.2  --   --   --   --   --   --   --   < > = values in this interval not displayed. Liver Function  Tests:  Recent Labs  12/19/14 1923 12/21/14 0736 12/29/14 03/10/15 1807 06/30/15  AST 199* 106* 27 48* 18  ALT 257* 199*  38 51 19  ALKPHOS 65 63 42 70 64  BILITOT 2.1* 1.9*  --  1.2  --   PROT 6.5 6.6  --  6.9  --   ALBUMIN 3.7 3.6  --  3.8  --    No results for input(s): LIPASE, AMYLASE in the last 8760 hours.  Recent Labs  12/18/14 2042  AMMONIA 20   CBC:  Recent Labs  12/18/14 1411  03/10/15 1807 03/11/15 0425  05/18/15 1616 05/21/15 0800 09/28/15  WBC 5.7  < > 8.7 6.7  < > 5.3 5.2 6.4  NEUTROABS 4.5  --  7.5  --   --  4.1  --   --   HGB 13.3  < > 13.5 13.1  < > 13.3 11.7* 13.3*  HCT 40.1  < > 40.5 40.7  < > 41.0 36.8* 41  MCV 96.9  < > 95.5 94.9  --  96.7 96.8  --   PLT 108*  < > 151 144*  < > 125.0* 113* 165  < > = values in this interval not displayed. CBG:  Recent Labs  12/18/14 1425  GLUCAP 142*   TSH: No results for input(s): TSH in the last 8760 hours. A1C: Lab Results  Component Value Date   HGBA1C * 01/13/2010    5.7 (NOTE)                                                                       According to the ADA Clinical Practice Recommendations for 2011, when HbA1c is used as a screening test:   >=6.5%   Diagnostic of Diabetes Mellitus           (if abnormal result  is confirmed)  5.7-6.4%   Increased risk of developing Diabetes Mellitus  References:Diagnosis and Classification of Diabetes Mellitus,Diabetes [id number] 1):S62-S69 and Standards of Medical Care in         Diabetes - 2011,Diabetes [id number]  (Suppl 1):S11-S61.   Lipid Panel:  Recent Labs  06/30/15  CHOL 96  HDL 35  LDLCALC 49  TRIG 61       Assessment/Plan  1. Acute on chronic systolic heart failure (HCC) -mild with normal 02 sats and only mild increased resp rate -increased Lasix to 40 mg BID for two days, then return to 40 mg qd -K is 5.0 so now Kdur at this time -daily weights -strict I and O -may have foley if difficulty urinating, wanted to avoid  due to his agitation and possibility of pulling it out -BMP on 1/29 -VS qshift with 02 sats   2) CKD stage 4 -stable but will need to monitored due to increased diuretic doses     Cindi Carbon, [place] [phone]

## 2015-10-01 LAB — BASIC METABOLIC PANEL
BUN: 39 mg/dL — AB (ref 4–21)
Creatinine: 1.7 mg/dL — AB (ref 0.6–1.3)
GLUCOSE: 125 mg/dL
POTASSIUM: 4.7 mmol/L (ref 3.4–5.3)
SODIUM: 142 mmol/L (ref 137–147)

## 2015-10-02 ENCOUNTER — Non-Acute Institutional Stay (SKILLED_NURSING_FACILITY): Payer: Medicare Other | Admitting: Adult Health

## 2015-10-02 DIAGNOSIS — R8271 Bacteriuria: Secondary | ICD-10-CM | POA: Diagnosis not present

## 2015-10-02 DIAGNOSIS — I5033 Acute on chronic diastolic (congestive) heart failure: Secondary | ICD-10-CM | POA: Diagnosis not present

## 2015-10-02 DIAGNOSIS — N184 Chronic kidney disease, stage 4 (severe): Secondary | ICD-10-CM

## 2015-10-03 ENCOUNTER — Encounter: Payer: Self-pay | Admitting: Adult Health

## 2015-10-03 DIAGNOSIS — N184 Chronic kidney disease, stage 4 (severe): Secondary | ICD-10-CM | POA: Insufficient documentation

## 2015-10-03 NOTE — Progress Notes (Signed)
Patient ID: Jacob Rosales, male   DOB: 1928/05/18, 82 y.o.   MRN: [id number]     Nursing Home Location:  [place]   Code Status: DNR  Patient Care Team: Gayland Curry, DO as PCP - General (Geriatric Medicine) Jarome Matin, MD as Consulting Physician (Dermatology) Evans Lance, MD as Consulting Physician (Cardiology) [facility] Earnie Larsson, MD as Consulting Physician (Neurosurgery) Bjorn Loser, MD as Consulting Physician (Urology)   Place of Service: SNF (31)  Chief Complaint  Patient presents with  . Acute Visit    f/u CHF, ?UTI    HPI:  82 y.o. male  residing at [facility], skilled section. He has a hx of CVA, pacemaker with complete heart block, CHF, dysphagia,  and vascular dementia. The staff me to see Jacob Rosales for increased wob on 1/27.  He was found to be in mild CHF per CXR and a BNP returned at 1713.   He has had several CHF exacerbations in the past, most recent echo in April of 2016 shows an EF of 25%. He was given lasix 40 MG BID for 2 days.  A UA C and S was obtained due to frequency which showed no bacteria, neg nitrites, no wbc, but eventually grew out 100,000 colonies of e coli. He was placed on macrobid by the oncall provider. He reports that his breathing has improved.  He denies any difficulty urinating or s/p pain at this time.   Review of Systems:  Review of Systems  Unable to perform ROS: Dementia    Medications: Patient's Medications  New Prescriptions   No medications on file  Previous Medications   ACETAMINOPHEN (TYLENOL) 325 MG TABLET    Take 325 mg by mouth every 6 (six) hours as needed for mild pain or moderate pain.   BISACODYL (DULCOLAX) 5 MG EC TABLET    Take 5 mg by mouth daily as needed for mild constipation or moderate constipation.   CALCIUM CARBONATE (TUMS EX) 750 MG CHEWABLE TABLET    Chew 2 tablets by mouth 2 (two) times daily.   CARVEDILOL (COREG) 6.25 MG TABLET     Take 6.25 mg by mouth 2 (two) times daily with a meal.   CHOLECALCIFEROL (VITAMIN D) 1000 UNITS TABLET    Take 1,000 Units by mouth daily.    FEEDING SUPPLEMENT (BOOST / RESOURCE BREEZE) LIQD    Take 1 Container by mouth 3 (three) times daily between meals.   FUROSEMIDE (LASIX) 40 MG TABLET       GUAIFENESIN (MUCINEX) 600 MG 12 HR TABLET    Take 600 mg by mouth 2 (two) times daily.   HYDRALAZINE (APRESOLINE) 25 MG TABLET    Take 1 tablet (25 mg total) by mouth 2 (two) times daily.   ISOSORBIDE MONONITRATE (IMDUR) 30 MG 24 HR TABLET    Take 1 tablet (30 mg total) by mouth daily.   LISINOPRIL (PRINIVIL,ZESTRIL) 10 MG TABLET    Take 1 tablet (10 mg total) by mouth daily.   LORAZEPAM (ATIVAN) 0.5 MG TABLET    Take 0.25 mg by mouth at bedtime. Take 1/2 tab   MELATONIN 5 MG CAPS    Take 1 capsule by mouth at bedtime.    MEMANTINE (NAMENDA XR) 28 MG CP24 24 HR CAPSULE    Take 28 mg by mouth daily.   RIVAROXABAN (XARELTO) 15 MG TABS TABLET    Take 1 tablet (15 mg total) by mouth daily with supper.  SENNA-DOCUSATE (SENOKOT-S) 8.6-50 MG PER TABLET    Take 2 tablets by mouth at bedtime.    SIMVASTATIN (ZOCOR) 20 MG TABLET    TAKE 1 TABLET BY MOUTH AT BEDTIME FOR CHOLESTEROL   SPIRONOLACTONE (ALDACTONE) 25 MG TABLET    Take 0.5 tablets (12.5 mg total) by mouth daily.  Modified Medications   No medications on file  Discontinued Medications   No medications on file     Physical Exam:  Filed Vitals:   10/03/15 1110  BP: 128/92  Pulse: 78  Temp: 97.8 F (36.6 C)  Resp: 20  SpO2: 95%    Physical Exam  Constitutional: No distress.  HENT:  Head: Normocephalic and atraumatic.  Mouth/Throat: Oropharynx is clear and moist. No oropharyngeal exudate.  Eyes: Conjunctivae and EOM are normal. Pupils are equal, round, and reactive to light. Right eye exhibits no discharge. Left eye exhibits no discharge.  Neck: No JVD present. No tracheal deviation present. No thyromegaly present.  Cardiovascular:  Normal rate.   No edema  Pulmonary/Chest: Effort normal. No respiratory distress. He has no wheezes. He has no rales.  Increased rate 22  Abdominal: Soft. Bowel sounds are normal. He exhibits no distension.  No s/p tenderness  Lymphadenopathy:    He has no cervical adenopathy.  Neurological: He is alert.  Oriented x 2, able to f/c  Skin: Skin is warm and dry. He is not diaphoretic.  Psychiatric: Affect normal.    Wt Readings from Last 3 Encounters:  09/29/15 146 lb 6.4 oz (66.407 kg)  09/21/15 151 lb 9.6 oz (68.765 kg)  09/08/15 147 lb 6.4 oz (66.86 kg)     Labs reviewed/Significant Diagnostic Results:  Basic Metabolic Panel:  Recent Labs  12/19/14 0125  05/22/15 0109 05/23/15 0117  06/07/15 1326 06/30/15 09/28/15 10/01/15  NA 141  < > 140 139  < > 137 137 143 142  K 4.1  < > 4.0 3.5  < > 4.3 4.4 5.0 4.7  CL 106  < > 101 102  --  103  --   --   --   CO2 23  < > 30 25  --  24  --   --   --   GLUCOSE 117*  < > 109* 96  --  99  --   --   --   BUN 48*  < > 35* 38*  < > 30* 38* 39* 39*  CREATININE 1.86*  < > 1.91* 1.89*  < > 1.38* 1.4* 1.8* 1.7*  CALCIUM 9.2  < > 9.1 8.8*  --  9.0  --   --   --   MG 2.2  --   --   --   --   --   --   --   --   < > = values in this interval not displayed. Liver Function Tests:  Recent Labs  12/19/14 1923 12/21/14 0736 12/29/14 03/10/15 1807 06/30/15  AST 199* 106* 27 48* 18  ALT 257* 199* 38 51 19  ALKPHOS 65 63 42 70 64  BILITOT 2.1* 1.9*  --  1.2  --   PROT 6.5 6.6  --  6.9  --   ALBUMIN 3.7 3.6  --  3.8  --    No results for input(s): LIPASE, AMYLASE in the last 8760 hours.  Recent Labs  12/18/14 2042  AMMONIA 20   CBC:  Recent Labs  12/18/14 1411  03/10/15 1807 03/11/15 0425  05/18/15 1616 05/21/15 0800  09/28/15  WBC 5.7  < > 8.7 6.7  < > 5.3 5.2 6.4  NEUTROABS 4.5  --  7.5  --   --  4.1  --   --   HGB 13.3  < > 13.5 13.1  < > 13.3 11.7* 13.3*  HCT 40.1  < > 40.5 40.7  < > 41.0 36.8* 41  MCV 96.9  < > 95.5 94.9   --  96.7 96.8  --   PLT 108*  < > 151 144*  < > 125.0* 113* 165  < > = values in this interval not displayed. CBG:  Recent Labs  12/18/14 1425  GLUCAP 142*   TSH: No results for input(s): TSH in the last 8760 hours. A1C: Lab Results  Component Value Date   HGBA1C * 01/13/2010    5.7 (NOTE)                                                                       According to the ADA Clinical Practice Recommendations for 2011, when HbA1c is used as a screening test:   >=6.5%   Diagnostic of Diabetes Mellitus           (if abnormal result  is confirmed)  5.7-6.4%   Increased risk of developing Diabetes Mellitus  References:Diagnosis and Classification of Diabetes Mellitus,Diabetes [id number] 1):S62-S69 and Standards of Medical Care in         Diabetes - 2011,Diabetes [id number]  (Suppl 1):S11-S61.   Lipid Panel:  Recent Labs  06/30/15  CHOL 96  HDL 35  LDLCALC 49  TRIG 61    Study Conclusions  - Left ventricle: Apex moves a little better than the other walls. The cavity size was moderately dilated. The estimated ejection fraction was 25%. Diffuse hypokinesis. - Aortic valve: Sclerosis without stenosis. There was mild regurgitation. - Aorta: Mild dilitation of the aortic root. - Mitral valve: There was moderate regurgitation. - Left atrium: The atrium was moderately dilated. - Right ventricle: The cavity size was mildly dilated. Pacer wire or catheter noted in right ventricle. Systolic function was mildly reduced. - Pulmonary arteries: PA peak pressure: 52 mm Hg (S).    Assessment/Plan  1. Acute on chronic systolic heart failure (HCC) -improved, no crackles or jvd today -weights are quite variable and therefore it is difficult to assess his fluid status but he has no edema -give one additional dose of lasix 40 mg today, then resume previous regimen -VS qshift with 02 sats, daily weights  2) CKD stage 4 -unchanged after increase lasix dosing, but  will need to monitored due to increased diuretic doses -Estimated Creatinine Clearance: 29.8 mL/min (by C-G formula based on Cr of 1.7).  3) Asymptomatic bacteruria -no symptoms, no wbc in urine -d/c macrobid and florastor     Cindi Carbon, [place] [phone]

## 2015-10-04 ENCOUNTER — Ambulatory Visit: Payer: Medicare Other

## 2015-10-11 ENCOUNTER — Ambulatory Visit (INDEPENDENT_AMBULATORY_CARE_PROVIDER_SITE_OTHER): Payer: Medicare Other | Admitting: *Deleted

## 2015-10-11 DIAGNOSIS — Z95810 Presence of automatic (implantable) cardiac defibrillator: Secondary | ICD-10-CM

## 2015-10-11 DIAGNOSIS — I5033 Acute on chronic diastolic (congestive) heart failure: Secondary | ICD-10-CM

## 2015-10-11 DIAGNOSIS — I255 Ischemic cardiomyopathy: Secondary | ICD-10-CM | POA: Diagnosis not present

## 2015-10-11 LAB — CUP PACEART REMOTE DEVICE CHECK
Battery Remaining Longevity: 14 mo
Battery Remaining Percentage: 20 %
Brady Statistic AP VS Percent: 0 %
Brady Statistic AS VS Percent: 0 %
Date Time Interrogation Session: 20170208082228
HighPow Impedance: 39 Ohm
Implantable Lead Implant Date: 20070723
Implantable Lead Implant Date: 20070917
Implantable Lead Location: 753858
Implantable Lead Model: 7020
Lead Channel Pacing Threshold Amplitude: 0.75 V
Lead Channel Pacing Threshold Pulse Width: 0.5 ms
Lead Channel Pacing Threshold Pulse Width: 0.8 ms
Lead Channel Pacing Threshold Pulse Width: 1 ms
Lead Channel Sensing Intrinsic Amplitude: 4.3 mV
Lead Channel Setting Pacing Amplitude: 2.5 V
Lead Channel Setting Pacing Amplitude: 2.5 V
Lead Channel Setting Pacing Pulse Width: 1 ms
MDC IDC LEAD IMPLANT DT: 20070723
MDC IDC LEAD LOCATION: 753859
MDC IDC LEAD LOCATION: 753860
MDC IDC MSMT BATTERY VOLTAGE: 2.77 V
MDC IDC MSMT LEADCHNL LV IMPEDANCE VALUE: 600 Ohm
MDC IDC MSMT LEADCHNL LV PACING THRESHOLD AMPLITUDE: 0.75 V
MDC IDC MSMT LEADCHNL RA IMPEDANCE VALUE: 350 Ohm
MDC IDC MSMT LEADCHNL RV IMPEDANCE VALUE: 310 Ohm
MDC IDC MSMT LEADCHNL RV PACING THRESHOLD AMPLITUDE: 1.25 V
MDC IDC MSMT LEADCHNL RV SENSING INTR AMPL: 11.8 mV
MDC IDC PG SERIAL: 631860
MDC IDC SET LEADCHNL RA PACING AMPLITUDE: 2 V
MDC IDC SET LEADCHNL RV PACING PULSEWIDTH: 0.5 ms
MDC IDC SET LEADCHNL RV SENSING SENSITIVITY: 2 mV
MDC IDC STAT BRADY AP VP PERCENT: 61 %
MDC IDC STAT BRADY AS VP PERCENT: 39 %
MDC IDC STAT BRADY RA PERCENT PACED: 1 %

## 2015-10-11 NOTE — Progress Notes (Signed)
Remote ICD transmission.   

## 2015-10-12 ENCOUNTER — Encounter: Payer: Self-pay | Admitting: Adult Health

## 2015-10-12 ENCOUNTER — Non-Acute Institutional Stay (SKILLED_NURSING_FACILITY): Payer: Medicare Other | Admitting: Adult Health

## 2015-10-12 DIAGNOSIS — I5023 Acute on chronic systolic (congestive) heart failure: Secondary | ICD-10-CM

## 2015-10-12 DIAGNOSIS — R350 Frequency of micturition: Secondary | ICD-10-CM

## 2015-10-12 DIAGNOSIS — G4700 Insomnia, unspecified: Secondary | ICD-10-CM | POA: Diagnosis not present

## 2015-10-12 DIAGNOSIS — R059 Cough, unspecified: Secondary | ICD-10-CM

## 2015-10-12 DIAGNOSIS — R05 Cough: Secondary | ICD-10-CM | POA: Diagnosis not present

## 2015-10-12 NOTE — Progress Notes (Addendum)
EPIC Encounter for ICM Monitoring  Patient Name: Jacob Rosales is a 82 y.o. male Date: 10/12/2015 Primary Care Physican: Hollace Kinnier, DO Primary Cardiologist: Lovena Le Electrophysiologist: Lovena Le Dry Weight: unknown       In the past month, have you:  1. Gained more than 2 pounds in a day or more than 5 pounds in a week? unknown  2. Had changes in your medications (with verification of current medications)? no  3. Had more shortness of breath than is usual for you? no  4. Limited your activity because of shortness of breath? no  5. Not been able to sleep because of shortness of breath? no  6. Had increased swelling in your feet or ankles? no  7. Had symptoms of dehydration (dizziness, dry mouth, increased thirst, decreased urine output) no  8. Had changes in sodium restriction? Unknown, in skilled facility  9. Been compliant with medication? Yes   ICM trend: 3 month view    ICM trend: 1 year view   Follow-up plan: ICM clinic phone appointment on 11/14/2015.   [facility] thoracic impedance below reference line 10/09/2015 to 10/11/2015 suggesting fluid retention.  Call to wife and she reported she has been sick and not seen patient in several days.   I requested number for the facility so I can talk with the nurse.  Call to [facility] at [phone] and was given number to call supervisior at [phone]. Spoke with Sam, RN.  Explained reason for call and he stated patient has a cough and he has scheduled for [name], NP to see patient today.  Explained the device fluid level suggest fluid retention and would send copy of note to C. Wert, NP for her review and changes in treatment if needed.       Copy of note sent to patient's primary care physician, primary cardiologist, and device following physician.  Rosalene Billings, RN, CCM 10/12/2015 9:17 AM

## 2015-10-12 NOTE — Progress Notes (Addendum)
Patient ID: Jacob Rosales, male   DOB: Mar 24, 1928, 82 y.o.   MRN: [id number]     Nursing Home Location:  [place]   Code Status: DNR  Patient Care Team: Gayland Curry, DO as PCP - General (Geriatric Medicine) Jarome Matin, MD as Consulting Physician (Dermatology) Evans Lance, MD as Consulting Physician (Cardiology) [facility] Earnie Larsson, MD as Consulting Physician (Neurosurgery) Bjorn Loser, MD as Consulting Physician (Urology)   Place of Service: SNF (31)  Chief Complaint  Patient presents with  . Acute Visit    cough, congestion, frequency    HPI:  82 y.o. male  residing at [facility], skilled section. He has a hx of CVA, pacemaker with complete heart block, CHF, dysphagia,  and vascular dementia. The staff me to see Jacob Rosales for increased wob on 1/27.  He was found to be in mild CHF per CXR and a BNP returned at 1713.   He has had several CHF exacerbations in the past, most recent echo in April of 2016 shows an EF of 25%. He was given lasix 40 MG BID for 2 days.  A UA C and S was obtained due to frequency which showed no bacteria, neg nitrites, no wbc, but eventually grew out 100,000 colonies of e coli. He was not placed on an antibiotic, as it was believed to be a colonization of bacteria. 10/12/2015 update I was asked to see him for cough and congestion.  He has had a prolonged hx of cough with sputum production, thought to be due to his chronic swallowing issues. He has not had a fever or sob but does breath a shallow manner/rapid manner at baseline. He has never smoked and does not have a hx of COPD. He is currently on a D2 diet with NTL. Staff reports that he is coughing more than usual, not correlated with meals. He usually has yellow sputum but today it is tan with some food particles in it.   Since he moved to skilled care he is not receiving the cuing and additional assistance to enhance safe eating  practices because his family wants him in a specific dining room that does not provide these cues. His weight has remained stable at 148 lbs. He denies any SOB or CP.  I received a message from the cardiology office that his impendence monitor is indicating that he has increased fluid retention.  He was up last night many times and did not sleep well. He was previously failed trial of ativan, thought to make him worse. He is currently on melatonin and remeron was added on 10/05/15.  Prior to last night there were several nights where he did sleep better but continues to try to get up without assistance and has fallen on the mat beside his bed several times. He continues to deny pain with urination but tends to go frequently. A PVR was performed showing 10 cc in the bladder after voiding 400cc of dark yellow urine. Resident has a sick contact, his wife, who was recently treated for a sinus infection.           Review of Systems:  Review of Systems  Unable to perform ROS: Dementia  Constitutional: Negative for diaphoresis, activity change, appetite change, fatigue and unexpected weight change.  HENT: Positive for congestion. Negative for postnasal drip.   Respiratory: Positive for cough. Negative for shortness of breath, wheezing and stridor.   Cardiovascular: Negative for chest pain, palpitations and  leg swelling.  Genitourinary: Positive for enuresis. Negative for dysuria, hematuria, flank pain, discharge, difficulty urinating and genital sores.  Musculoskeletal: Positive for gait problem. Negative for back pain and arthralgias.  Neurological: Negative for dizziness, facial asymmetry, light-headedness, numbness and headaches.       Dysphonia    Medications: Patient's Medications  New Prescriptions   No medications on file  Previous Medications   ACETAMINOPHEN (TYLENOL) 325 MG TABLET    Take 325 mg by mouth every 6 (six) hours as needed for mild pain or moderate pain.   BISACODYL (DULCOLAX) 5  MG EC TABLET    Take 5 mg by mouth daily as needed for mild constipation or moderate constipation.   CALCIUM CARBONATE (TUMS EX) 750 MG CHEWABLE TABLET    Chew 2 tablets by mouth 2 (two) times daily.   CARVEDILOL (COREG) 6.25 MG TABLET    Take 6.25 mg by mouth 2 (two) times daily with a meal.   CHOLECALCIFEROL (VITAMIN D) 1000 UNITS TABLET    Take 1,000 Units by mouth daily.    FEEDING SUPPLEMENT (BOOST / RESOURCE BREEZE) LIQD    Take 1 Container by mouth 3 (three) times daily between meals.   FUROSEMIDE (LASIX) 40 MG TABLET       GUAIFENESIN (MUCINEX) 600 MG 12 HR TABLET    Take 600 mg by mouth 2 (two) times daily.   HYDRALAZINE (APRESOLINE) 25 MG TABLET    Take 1 tablet (25 mg total) by mouth 2 (two) times daily.   ISOSORBIDE MONONITRATE (IMDUR) 30 MG 24 HR TABLET    Take 1 tablet (30 mg total) by mouth daily.   LISINOPRIL (PRINIVIL,ZESTRIL) 10 MG TABLET    Take 1 tablet (10 mg total) by mouth daily.   MELATONIN 5 MG CAPS    Take 1 capsule by mouth at bedtime.    MEMANTINE (NAMENDA XR) 28 MG CP24 24 HR CAPSULE    Take 28 mg by mouth daily.   MIRTAZAPINE (REMERON) 7.5 MG TABLET    Take 7.5 mg by mouth at bedtime.   RIVAROXABAN (XARELTO) 15 MG TABS TABLET    Take 1 tablet (15 mg total) by mouth daily with supper.   SENNA-DOCUSATE (SENOKOT-S) 8.6-50 MG PER TABLET    Take 2 tablets by mouth at bedtime.    SIMVASTATIN (ZOCOR) 20 MG TABLET    TAKE 1 TABLET BY MOUTH AT BEDTIME FOR CHOLESTEROL   SPIRONOLACTONE (ALDACTONE) 25 MG TABLET    Take 0.5 tablets (12.5 mg total) by mouth daily.  Modified Medications   No medications on file  Discontinued Medications   LORAZEPAM (ATIVAN) 0.5 MG TABLET    Take 0.25 mg by mouth at bedtime. Take 1/2 tab     Physical Exam:  Filed Vitals:   10/12/15 1521  BP: 110/76  Pulse: 75  Temp: 97.9 F (36.6 C)  Resp: 20  Weight: 148 lb (67.132 kg)  SpO2: 97%    Physical Exam  Constitutional: No distress.  HENT:  Head: Normocephalic and atraumatic.    Mouth/Throat: Oropharynx is clear and moist. No oropharyngeal exudate.  Eyes: Conjunctivae and EOM are normal. Pupils are equal, round, and reactive to light. Right eye exhibits no discharge. Left eye exhibits no discharge.  Neck: JVD present. No tracheal deviation present. No thyromegaly present.  Cardiovascular: Normal rate.   No edema  Pulmonary/Chest: Effort normal and breath sounds normal. No respiratory distress. He has no wheezes. He has no rales.  Increased rate 22  Abdominal: Soft.  Bowel sounds are normal. He exhibits no distension.  No s/p tenderness  Genitourinary: Rectum normal and penis normal. No discharge found.  Prostate +1, soft but not boggy, not tender  Lymphadenopathy:    He has no cervical adenopathy.  Neurological: He is alert.  Oriented x 2, able to f/c  Skin: Skin is warm and dry. He is not diaphoretic.  Psychiatric: Affect normal.    Wt Readings from Last 3 Encounters:  10/12/15 148 lb (67.132 kg)  09/29/15 146 lb 6.4 oz (66.407 kg)  09/21/15 151 lb 9.6 oz (68.765 kg)     Labs reviewed/Significant Diagnostic Results:  Basic Metabolic Panel:  Recent Labs  12/19/14 0125  05/22/15 0109 05/23/15 0117  06/07/15 1326 06/30/15 09/28/15 10/01/15  NA 141  < > 140 139  < > 137 137 143 142  K 4.1  < > 4.0 3.5  < > 4.3 4.4 5.0 4.7  CL 106  < > 101 102  --  103  --   --   --   CO2 23  < > 30 25  --  24  --   --   --   GLUCOSE 117*  < > 109* 96  --  99  --   --   --   BUN 48*  < > 35* 38*  < > 30* 38* 39* 39*  CREATININE 1.86*  < > 1.91* 1.89*  < > 1.38* 1.4* 1.8* 1.7*  CALCIUM 9.2  < > 9.1 8.8*  --  9.0  --   --   --   MG 2.2  --   --   --   --   --   --   --   --   < > = values in this interval not displayed. Liver Function Tests:  Recent Labs  12/19/14 1923 12/21/14 0736 12/29/14 03/10/15 1807 06/30/15  AST 199* 106* 27 48* 18  ALT 257* 199* 38 51 19  ALKPHOS 65 63 42 70 64  BILITOT 2.1* 1.9*  --  1.2  --   PROT 6.5 6.6  --  6.9  --   ALBUMIN  3.7 3.6  --  3.8  --    No results for input(s): LIPASE, AMYLASE in the last 8760 hours.  Recent Labs  12/18/14 2042  AMMONIA 20   CBC:  Recent Labs  12/18/14 1411  03/10/15 1807 03/11/15 0425  05/18/15 1616 05/21/15 0800 09/28/15  WBC 5.7  < > 8.7 6.7  < > 5.3 5.2 6.4  NEUTROABS 4.5  --  7.5  --   --  4.1  --   --   HGB 13.3  < > 13.5 13.1  < > 13.3 11.7* 13.3*  HCT 40.1  < > 40.5 40.7  < > 41.0 36.8* 41  MCV 96.9  < > 95.5 94.9  --  96.7 96.8  --   PLT 108*  < > 151 144*  < > 125.0* 113* 165  < > = values in this interval not displayed. CBG:  Recent Labs  12/18/14 1425  GLUCAP 142*   TSH: No results for input(s): TSH in the last 8760 hours. A1C: Lab Results  Component Value Date   HGBA1C * 01/13/2010    5.7 (NOTE)  According to the ADA Clinical Practice Recommendations for 2011, when HbA1c is used as a screening test:   >=6.5%   Diagnostic of Diabetes Mellitus           (if abnormal result  is confirmed)  5.7-6.4%   Increased risk of developing Diabetes Mellitus  References:Diagnosis and Classification of Diabetes Mellitus,Diabetes [id number] 1):S62-S69 and Standards of Medical Care in         Diabetes - 2011,Diabetes [id number]  (Suppl 1):S11-S61.   Lipid Panel:  Recent Labs  06/30/15  CHOL 96  HDL 35  LDLCALC 49  TRIG 61    Study Conclusions  - Left ventricle: Apex moves a little better than the other walls. The cavity size was moderately dilated. The estimated ejection fraction was 25%. Diffuse hypokinesis. - Aortic valve: Sclerosis without stenosis. There was mild regurgitation. - Aorta: Mild dilitation of the aortic root. - Mitral valve: There was moderate regurgitation. - Left atrium: The atrium was moderately dilated. - Right ventricle: The cavity size was mildly dilated. Pacer wire or catheter noted in right ventricle. Systolic function was mildly  reduced. - Pulmonary arteries: PA peak pressure: 52 mm Hg (S).    Assessment/Plan  1) Cough -needs another CXR to see to eval for asp pna vs. chf -failed trial of mucinex dm but it was being crushed, so would try guaifenesin 400mg  2 tabs crushed BID -may need further ST, await CXR -ongoing symptom with recent sick contact, begin Doxycycline 100 mg daily for 7 days  2) Acute on chronic CHF systolic -no change in weight, no crackles on exam, but does have jvd and impendence monitoring indicates fluid retention -increase lasix to 40 mg BID for 3 days and follow up with cards as soon as possible -BMP, CBC, TSH in the am  3) Urinary frequency -recollect urine to r/o UTI -prostate is not significantly enlarged and there is minimal PVR -could consider Myrbetriq for over active bladder, but may need urology eval first  4) Insomnia -continue remeron and given it 4-6 weeks before re evaluating benefit   Cindi Carbon, [place] [phone]

## 2015-10-13 LAB — CBC AND DIFFERENTIAL
HCT: 40 % — AB (ref 41–53)
Hemoglobin: 13.3 g/dL — AB (ref 13.5–17.5)
PLATELETS: 150 10*3/uL (ref 150–399)
WBC: 6.8 10*3/mL

## 2015-10-13 LAB — TSH: TSH: 2.18 u[IU]/mL (ref 0.41–5.90)

## 2015-10-13 LAB — BASIC METABOLIC PANEL
BUN: 35 mg/dL — AB (ref 4–21)
CREATININE: 1.9 mg/dL — AB (ref 0.6–1.3)
Glucose: 97 mg/dL
Potassium: 4.7 mmol/L (ref 3.4–5.3)
Sodium: 143 mmol/L (ref 137–147)
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
## 2015-10-18 ENCOUNTER — Ambulatory Visit (INDEPENDENT_AMBULATORY_CARE_PROVIDER_SITE_OTHER): Payer: Medicare Other | Admitting: Physician Assistant

## 2015-10-18 ENCOUNTER — Encounter: Payer: Self-pay | Admitting: Physician Assistant

## 2015-10-18 VITALS — BP 90/58 | HR 76 | Ht 74.0 in | Wt 153.0 lb

## 2015-10-18 DIAGNOSIS — I255 Ischemic cardiomyopathy: Secondary | ICD-10-CM

## 2015-10-18 DIAGNOSIS — I5022 Chronic systolic (congestive) heart failure: Secondary | ICD-10-CM | POA: Diagnosis not present

## 2015-10-18 DIAGNOSIS — I2510 Atherosclerotic heart disease of native coronary artery without angina pectoris: Secondary | ICD-10-CM

## 2015-10-18 DIAGNOSIS — I480 Paroxysmal atrial fibrillation: Secondary | ICD-10-CM

## 2015-10-18 DIAGNOSIS — Z95810 Presence of automatic (implantable) cardiac defibrillator: Secondary | ICD-10-CM

## 2015-10-18 MED ORDER — LISINOPRIL 5 MG PO TABS: 5.0000 mg | ORAL_TABLET | Freq: Every day | ORAL | Status: DC

## 2015-10-18 NOTE — Patient Instructions (Addendum)
Medication Instructions:  1. DECREASE LISINOPRIL TO 5 MG DAILY  Labwork: NONE  Testing/Procedures: NONE  Follow-Up: FOLLOW UP WITH DR. Lovena Le AS PLANNED  Any Other Special Instructions Will Be Listed Below (If Applicable).  If you need a refill on your cardiac medications before your next appointment, please call your pharmacy.

## 2015-10-23 ENCOUNTER — Non-Acute Institutional Stay (SKILLED_NURSING_FACILITY): Payer: Medicare Other | Admitting: Adult Health

## 2015-10-23 ENCOUNTER — Encounter: Payer: Self-pay | Admitting: Adult Health

## 2015-10-23 DIAGNOSIS — R05 Cough: Secondary | ICD-10-CM

## 2015-10-23 DIAGNOSIS — R059 Cough, unspecified: Secondary | ICD-10-CM

## 2015-10-23 DIAGNOSIS — G4700 Insomnia, unspecified: Secondary | ICD-10-CM | POA: Diagnosis not present

## 2015-10-23 DIAGNOSIS — I5023 Acute on chronic systolic (congestive) heart failure: Secondary | ICD-10-CM | POA: Diagnosis not present

## 2015-10-23 NOTE — Progress Notes (Addendum)
Patient ID: Jacob Rosales, male   DOB: 1928-02-20, 82 y.o.   MRN: [id number]     Nursing Home Location:  [place]   Code Status: DNR  Patient Care Team: Gayland Curry, DO as PCP - General (Geriatric Medicine) Jarome Matin, MD as Consulting Physician (Dermatology) Evans Lance, MD as Consulting Physician (Cardiology) [facility] Earnie Larsson, MD as Consulting Physician (Neurosurgery) Bjorn Loser, MD as Consulting Physician (Urology)   Place of Service: SNF (31)  Chief Complaint  Patient presents with  . Acute Visit    weight gain    HPI:  82 y.o. male  residing at [facility], skilled section. He has a hx of CVA, pacemaker with complete heart block, CHF, dysphagia,  and vascular dementia. The staff reported that pt has not had his Lasix 40 mg qd for the past 7 days. He has had a weight gain of 4.5 lb from today's measurement 152 lb.  His last ECHO reported on April 2016 shows an EF of 25%. He continues to have a cough b ut staff reports that it has improved since the last visit. He had been on doxycycline started on 2/9 for the past 7 days. He is coughing up some saliva, but not yellow sputum. He has a hx of dysphagia and is also on an ACE inhibitor (dose reduce to 5 mg on 2/15 due to low BP). He reports that his breathing has improved. He has no dependent edema. He is on a D-2 ground nectar thickened liquid diet.  He is still having issues with sleeping. Staff reported that he was up from 12:30am to 3:15am last night. He typically falls asleep during the day in his chair. He was started on Remeron 7.5 mg on 2/2. The dose was increased to 15 mg on 2/16. He is also taking melatonin. Filed a trial of ativan for sleep  Review of Systems:  Review of Systems  Unable to perform ROS: Dementia  Constitutional: Positive for unexpected weight change. Negative for chills and fatigue.  HENT: Positive for trouble swallowing.  Negative for congestion, rhinorrhea, sinus pressure, sneezing and sore throat.   Respiratory: Positive for cough. Negative for chest tightness, shortness of breath and wheezing.   Cardiovascular: Negative for chest pain, palpitations and leg swelling.  Gastrointestinal: Negative for nausea, abdominal pain, diarrhea and constipation.  Genitourinary: Negative for dysuria, urgency and hematuria.  Musculoskeletal: Negative for neck pain.  Skin: Negative for rash and wound.  Neurological: Negative for tremors, facial asymmetry and weakness.  Psychiatric/Behavioral: Positive for confusion, sleep disturbance and agitation. The patient is not nervous/anxious.   All other systems reviewed and are negative.   Medications: Patient's Medications  New Prescriptions   No medications on file  Previous Medications   ACETAMINOPHEN (TYLENOL) 325 MG TABLET    Take 325 mg by mouth every 6 (six) hours as needed for mild pain or moderate pain.   BISACODYL (DULCOLAX) 5 MG EC TABLET    Take 5 mg by mouth daily as needed for mild constipation or moderate constipation.   CALCIUM CARBONATE (TUMS EX) 750 MG CHEWABLE TABLET    Chew 2 tablets by mouth 2 (two) times daily.   CARVEDILOL (COREG) 6.25 MG TABLET    Take 6.25 mg by mouth 2 (two) times daily with a meal.   CHOLECALCIFEROL (VITAMIN D) 1000 UNITS TABLET    Take 1,000 Units by mouth daily.    FEEDING SUPPLEMENT (BOOST / RESOURCE BREEZE) LIQD  Take 1 Container by mouth 3 (three) times daily between meals.   FUROSEMIDE (LASIX) 40 MG TABLET    Take 40 mg by mouth daily.    GUAIFENESIN PO    Take 400 mg by mouth 2 (two) times daily.   HYDRALAZINE (APRESOLINE) 25 MG TABLET    Take 1 tablet (25 mg total) by mouth 2 (two) times daily.   ISOSORBIDE MONONITRATE (IMDUR) 30 MG 24 HR TABLET    Take 1 tablet (30 mg total) by mouth daily.   LISINOPRIL (PRINIVIL,ZESTRIL) 5 MG TABLET    Take 1 tablet (5 mg total) by mouth daily.   MELATONIN 5 MG CAPS    Take 1 capsule by  mouth at bedtime.    MEMANTINE (NAMENDA XR) 28 MG CP24 24 HR CAPSULE    Take 28 mg by mouth daily.   MIRTAZAPINE (REMERON) 15 MG TABLET    Take 15 mg by mouth at bedtime.   RIVAROXABAN (XARELTO) 15 MG TABS TABLET    Take 1 tablet (15 mg total) by mouth daily with supper.   SENNA-DOCUSATE (SENOKOT-S) 8.6-50 MG PER TABLET    Take 2 tablets by mouth at bedtime.    SIMVASTATIN (ZOCOR) 20 MG TABLET    TAKE 1 TABLET BY MOUTH AT BEDTIME FOR CHOLESTEROL   SPIRONOLACTONE (ALDACTONE) 25 MG TABLET    Take 0.5 tablets (12.5 mg total) by mouth daily.  Modified Medications   No medications on file  Discontinued Medications   GUAIFENESIN (MUCINEX) 600 MG 12 HR TABLET    Take 600 mg by mouth 2 (two) times daily.   MIRTAZAPINE (REMERON) 7.5 MG TABLET    Take 7.5 mg by mouth at bedtime.     Physical Exam:  Filed Vitals:   10/23/15 1035  BP: 135/68  Pulse: 76  Temp: 98 F (36.7 C)  Resp: 18  SpO2: 94%    Physical Exam  Constitutional: No distress.  HENT:  Head: Normocephalic and atraumatic.  Nose: Nose normal.  Mouth/Throat: Oropharynx is clear and moist. No oropharyngeal exudate.  Eyes: Conjunctivae and EOM are normal. Pupils are equal, round, and reactive to light. Right eye exhibits no discharge. Left eye exhibits no discharge.  Neck: No JVD present. No tracheal deviation present. No thyromegaly present.  Cardiovascular: Normal rate.   No edema, irregular  Pulmonary/Chest: Effort normal and breath sounds normal. No respiratory distress. He has no wheezes. He has no rales.  Abdominal: Soft. Bowel sounds are normal. He exhibits no distension. There is no tenderness.  Musculoskeletal: He exhibits no edema or tenderness.  Lymphadenopathy:    He has no cervical adenopathy.  Neurological: He is alert.  Oriented x 2, able to f/c  Skin: Skin is warm and dry. He is not diaphoretic.  Psychiatric: Mood and affect normal.  Nursing note and vitals reviewed.   Wt Readings from Last 3 Encounters:    10/18/15 153 lb (69.4 kg)  10/12/15 148 lb (67.132 kg)  09/29/15 146 lb 6.4 oz (66.407 kg)     Labs reviewed/Significant Diagnostic Results:  Basic Metabolic Panel:  Recent Labs  12/19/14 0125  05/22/15 0109 05/23/15 0117  06/07/15 1326  09/28/15 10/01/15 10/13/15  NA 141  < > 140 139  < > 137  < > 143 142 143  K 4.1  < > 4.0 3.5  < > 4.3  < > 5.0 4.7 4.7  CL 106  < > 101 102  --  103  --   --   --   --  CO2 23  < > 30 25  --  24  --   --   --   --   GLUCOSE 117*  < > 109* 96  --  99  --   --   --   --   BUN 48*  < > 35* 38*  < > 30*  < > 39* 39* 35*  CREATININE 1.86*  < > 1.91* 1.89*  < > 1.38*  < > 1.8* 1.7* 1.9*  CALCIUM 9.2  < > 9.1 8.8*  --  9.0  --   --   --   --   MG 2.2  --   --   --   --   --   --   --   --   --   < > = values in this interval not displayed. Liver Function Tests:  Recent Labs  12/19/14 1923 12/21/14 0736 12/29/14 03/10/15 1807 06/30/15  AST 199* 106* 27 48* 18  ALT 257* 199* 38 51 19  ALKPHOS 65 63 42 70 64  BILITOT 2.1* 1.9*  --  1.2  --   PROT 6.5 6.6  --  6.9  --   ALBUMIN 3.7 3.6  --  3.8  --    No results for input(s): LIPASE, AMYLASE in the last 8760 hours.  Recent Labs  12/18/14 2042  AMMONIA 20   CBC:  Recent Labs  12/18/14 1411  03/10/15 1807 03/11/15 0425  05/18/15 1616 05/21/15 0800 09/28/15 10/13/15  WBC 5.7  < > 8.7 6.7  < > 5.3 5.2 6.4 6.8  NEUTROABS 4.5  --  7.5  --   --  4.1  --   --   --   HGB 13.3  < > 13.5 13.1  < > 13.3 11.7* 13.3* 13.3*  HCT 40.1  < > 40.5 40.7  < > 41.0 36.8* 41 40*  MCV 96.9  < > 95.5 94.9  --  96.7 96.8  --   --   PLT 108*  < > 151 144*  < > 125.0* 113* 165 150  < > = values in this interval not displayed. CBG:  Recent Labs  12/18/14 1425  GLUCAP 142*   TSH:  Recent Labs  10/13/15  TSH 2.18   A1C: Lab Results  Component Value Date   HGBA1C * 01/13/2010    5.7 (NOTE)                                                                       According to the ADA Clinical  Practice Recommendations for 2011, when HbA1c is used as a screening test:   >=6.5%   Diagnostic of Diabetes Mellitus           (if abnormal result  is confirmed)  5.7-6.4%   Increased risk of developing Diabetes Mellitus  References:Diagnosis and Classification of Diabetes Mellitus,Diabetes [id number] 1):S62-S69 and Standards of Medical Care in         Diabetes - 2011,Diabetes Care,2011,34  (Suppl 1):S11-S61.   Lipid Panel:  Recent Labs  06/30/15  CHOL 96  HDL 35  LDLCALC 49  TRIG 61    Study Conclusions  - Left ventricle: Apex moves a little better than  the other walls. The cavity size was moderately dilated. The estimated ejection fraction was 25%. Diffuse hypokinesis. - Aortic valve: Sclerosis without stenosis. There was mild regurgitation. - Aorta: Mild dilitation of the aortic root. - Mitral valve: There was moderate regurgitation. - Left atrium: The atrium was moderately dilated. - Right ventricle: The cavity size was mildly dilated. Pacer wire or catheter noted in right ventricle. Systolic function was mildly reduced. - Pulmonary arteries: PA peak pressure: 52 mm Hg (S).    Assessment/Plan  1. Acute on chronic systolic CHF (congestive heart failure), NYHA class 3 (HCC) -Stable -Increase Lasix to 40 mg bid for 2 days, then return to maintenance dose of 40 mg daily.   2. Cough - Improved -Cough improved after course of 7 day doxycycline -continue mucinex BID  3. Insomnia -not controlled -continues to have issues with night time awakenings but would not change meds at this point and give the Remeron more time to work   Laurin Coder, DNP student Cindi Carbon, [place] [phone]

## 2015-11-12 LAB — CUP PACEART REMOTE DEVICE CHECK
Battery Remaining Longevity: 14 mo
Battery Remaining Percentage: 20 %
Battery Voltage: 2.77 V
Brady Statistic AP VS Percent: 0 %
Brady Statistic RA Percent Paced: 1 %
Date Time Interrogation Session: 20170208082228
HIGH POWER IMPEDANCE MEASURED VALUE: 39 Ohm
Implantable Lead Implant Date: 20070723
Implantable Lead Location: 753859
Implantable Lead Location: 753860
Implantable Lead Model: 7020
Lead Channel Impedance Value: 310 Ohm
Lead Channel Impedance Value: 600 Ohm
Lead Channel Pacing Threshold Amplitude: 0.75 V
Lead Channel Pacing Threshold Amplitude: 0.75 V
Lead Channel Pacing Threshold Pulse Width: 0.5 ms
Lead Channel Pacing Threshold Pulse Width: 0.8 ms
Lead Channel Sensing Intrinsic Amplitude: 4.3 mV
Lead Channel Setting Sensing Sensitivity: 2 mV
MDC IDC LEAD IMPLANT DT: 20070723
MDC IDC LEAD IMPLANT DT: 20070917
MDC IDC LEAD LOCATION: 753858
MDC IDC MSMT LEADCHNL LV PACING THRESHOLD PULSEWIDTH: 1 ms
MDC IDC MSMT LEADCHNL RA IMPEDANCE VALUE: 350 Ohm
MDC IDC MSMT LEADCHNL RV PACING THRESHOLD AMPLITUDE: 1.25 V
MDC IDC MSMT LEADCHNL RV SENSING INTR AMPL: 11.8 mV
MDC IDC SET LEADCHNL LV PACING AMPLITUDE: 2.5 V
MDC IDC SET LEADCHNL LV PACING PULSEWIDTH: 1 ms
MDC IDC SET LEADCHNL RA PACING AMPLITUDE: 2 V
MDC IDC SET LEADCHNL RV PACING AMPLITUDE: 2.5 V
MDC IDC SET LEADCHNL RV PACING PULSEWIDTH: 0.5 ms
MDC IDC STAT BRADY AP VP PERCENT: 61 %
MDC IDC STAT BRADY AS VP PERCENT: 39 %
MDC IDC STAT BRADY AS VS PERCENT: 0 %
Pulse Gen Serial Number: 631860

## 2015-11-14 ENCOUNTER — Ambulatory Visit (INDEPENDENT_AMBULATORY_CARE_PROVIDER_SITE_OTHER): Payer: Medicare Other

## 2015-11-14 ENCOUNTER — Telehealth: Payer: Self-pay | Admitting: Cardiology

## 2015-11-14 DIAGNOSIS — Z95810 Presence of automatic (implantable) cardiac defibrillator: Secondary | ICD-10-CM | POA: Diagnosis not present

## 2015-11-14 DIAGNOSIS — I5022 Chronic systolic (congestive) heart failure: Secondary | ICD-10-CM

## 2015-11-14 NOTE — Progress Notes (Signed)
EPIC Encounter for ICM Monitoring  Patient Name: Jacob Rosales is a 82 y.o. male Date: 11/14/2015 Primary Care Physican: Hollace Kinnier, DO Primary Cardiologist: Lovena Le Electrophysiologist: Lovena Le Dry Weight: 148.7 lb   Bi-V Pacing 97%      In the past month, have you:  1. Gained more than 2 pounds in a day or more than 5 pounds in a week? no  2. Had changes in your medications (with verification of current medications)? no  3. Had more shortness of breath than is usual for you? no  4. Limited your activity because of shortness of breath? no  5. Not been able to sleep because of shortness of breath? no  6. Had increased swelling in your feet or ankles? no  7. Had symptoms of dehydration (dizziness, dry mouth, increased thirst, decreased urine output) no  8. Had changes in sodium restriction? no  9. Been compliant with medication? Yes   ICM trend: 3 month view for 11/14/2015   ICM trend: 1 year view for 11/14/2015   Follow-up plan: ICM clinic phone appointment 11/21/2015.  Thoracic impedance below reference line from 11/03/2015 to 11/14/2015 (date of transmission) suggesting fluid accumulation.  Spoke with Tammy, patients nurse at [facility]  She reported patient is not have any symptoms.  She stated he has been coughing but that is ongoing and nothing new.  She requested to send note to Dr Mariea Clonts and [name], NP for review.  She reported the physcians will be out of the office until Friday and asked if patient can get an office appointment with Dr Lovena Le.  Advised I would review transmission with Dr Lovena Le in the office and call her back.    Dr Lovena Le ordered to increase Furosemide 40 mg to bid x 2 days only and then return to Furosemide 40 mg one tablet daily.    Call back to Tammy, RN at [facility] [employer] and notified of Dr Tanna Furry orders and verbalized understanding.  Advised will repeat ICM transmission on 11/21/2015.  Tammy can be reached at  [phone].  Copy of note sent to patient's primary care physician, primary cardiologist, and device following physician.  Rosalene Billings, RN, CCM 11/14/2015 2:31 PM

## 2015-11-14 NOTE — Telephone Encounter (Signed)
Confirmed remote transmission w/ pt wife.   

## 2015-11-16 ENCOUNTER — Non-Acute Institutional Stay (SKILLED_NURSING_FACILITY): Payer: Medicare Other | Admitting: Adult Health

## 2015-11-16 ENCOUNTER — Encounter: Payer: Self-pay | Admitting: Adult Health

## 2015-11-16 DIAGNOSIS — I10 Essential (primary) hypertension: Secondary | ICD-10-CM | POA: Diagnosis not present

## 2015-11-16 DIAGNOSIS — G4700 Insomnia, unspecified: Secondary | ICD-10-CM | POA: Diagnosis not present

## 2015-11-16 DIAGNOSIS — F0151 Vascular dementia with behavioral disturbance: Secondary | ICD-10-CM | POA: Diagnosis not present

## 2015-11-16 DIAGNOSIS — I509 Heart failure, unspecified: Secondary | ICD-10-CM

## 2015-11-16 NOTE — Progress Notes (Signed)
Patient ID: Jacob Rosales, male   DOB: 11-Feb-1928, 82 y.o.   MRN: [id number]  Location:  [place]:  SNF (31) Provider:   Cindi Carbon, ANP [place] [phone]   REED, Jonelle Sidle, DO  Patient Care Team: Gayland Curry, DO as PCP - General (Geriatric Medicine) Jarome Matin, MD as Consulting Physician (Dermatology) Evans Lance, MD as Consulting Physician (Cardiology) [facility] Earnie Larsson, MD as Consulting Physician (Neurosurgery) Bjorn Loser, MD as Consulting Physician (Urology)  Extended Emergency Contact Information Primary Emergency Contact: Stolp,[name] Address: [address]          [place], [place] [address] [place] Phone: [phone] Relation: Spouse Secondary Emergency Contact: [place] Phone: [phone] Relation: Daughter  Code Status:  DNR Goals of care: Advanced Directive information Advanced Directives 06/10/2015  Does patient have an advance directive? Yes  Type of Paramedic of [place];Living will;Out of facility DNR (pink MOST or yellow form)  Does patient want to make changes to advanced directive? No - Patient declined  Copy of advanced directive(s) in chart? No - copy requested  Pre-existing out of facility DNR order (yellow form or pink MOST form) Yellow form placed in chart (order not valid for inpatient use)     Chief Complaint  Patient presents with  . Acute Visit    insomnia    HPI:  Pt is a 82 y.o. male seen today for medical management of chronic diseases.    1. Insomnia He is currently taking Remeron 15 mg for insomnia with little improvement. Staff reports that pt didn't sleep any last night. He was on a trial of Ativan on 1/26 with no improvement.   2. Chronic congestive heart failure, unspecified congestive heart failure type ([place]) -noted EF 25% -Cardiology indicated that  his impedence monitor was showing increase fluid retention -Lasix was increased to 40 mg BID for 2 days, then returned to 40 mg daily -weights are variable and difficult to interpret  3. Vascular dementia, with behavioral disturbance He is taking Namenda XR for memory problems. He has not had any worsening behavioral disturbances. He had a [place] test on 05/25/15 in which he scored 11/30.  4. Essential hypertension He takes Coreg and Zestril, aprelosine for hypertension. His BP is controlled.    Past Medical History  Diagnosis Date  . Complete AV block ([place])   . Chronic systolic heart failure ([place])     a. Echo 4/16:  EF 25%, diff HK, Ao sclerosis without stenosis, mild AI, mild dilation of aortic root, mod MR, mod LAE, mildly reduced RVSF, PASP 52 mmHg  . Cardiomyopathy, ischemic     a. s/p STJ CRTD  . CAD (coronary artery disease)     s/p CABG  . Prostate cancer ([place])     S/P radiation rx  . Osteoporosis     A/P Vertebral compression fx's  . Hypertension   . Dyslipidemia   . TIA (transient ischemic attack)        . Lumbago    . Cervicalgia    . Unspecified vitamin D deficiency    . Basal cell carcinoma of skin of lower limb, including hip    . Squamous cell carcinoma of skin of lower limb, including hip    . Impotence of organic origin    . Insomnia, unspecified    . Paroxysmal atrial fibrillation (HCC)  a. intol of coumadin - Rx with Aggrenox  . Fracture of C2 vertebra, closed ([place])     s/p cervical fusion 05/2013   Past Surgical History  Procedure Laterality Date  . Coronary artery bypass graft    . Hernia repair    . Kyphosis surgery    . Biv-icd  11/21/2010    [employer] ICD Model#[id number] [phone]  . Cardiac defibrillator placement    . Cyst excision  10/2012    back Dr. Wilhemina Bonito  . Cyst removal neck  12/2012    basel cell (right) Dr. Sarajane Jews  . Colonoscopy  2006    polyps benign  . Dexa  April 2010  . Posterior cervical fusion/foraminotomy  N/A 05/21/2013    Procedure: Cervical One, Cervical Two, Cervical Three Posterior cervical fusion with lateral mass fixation;  Surgeon: Charlie Pitter, MD;  Location: [facility];  Service: Neurosurgery;  Laterality: N/A;  POSTERIOR CERVICAL FUSION/FORAMINOTOMY LEVEL 2  . Back surgery    . Coronary angioplasty      Allergies  Allergen Reactions  . Lisinopril Other (See Comments)    Acute renal failure  . Warfarin Sodium Other (See Comments)    REACTION: Stomach bleeding  . Amlodipine Besylate Swelling      Medication List       This list is accurate as of: 11/16/15  4:16 PM.  Always use your most recent med list.               bisacodyl 5 MG EC tablet  Commonly known as:  DULCOLAX  Take 5 mg by mouth daily as needed for mild constipation or moderate constipation.     calcium carbonate 750 MG chewable tablet  Commonly known as:  TUMS EX  Chew 2 tablets by mouth 2 (two) times daily.     carvedilol 6.25 MG tablet  Commonly known as:  COREG  Take 6.25 mg by mouth 2 (two) times daily with a meal.     cholecalciferol 1000 units tablet  Commonly known as:  VITAMIN D  Take 1,000 Units by mouth daily.     feeding supplement Liqd  Take 1 Container by mouth 3 (three) times daily between meals.     furosemide 40 MG tablet  Commonly known as:  LASIX  Take 40 mg by mouth daily.     GUAIFENESIN PO  Take 400 mg by mouth 2 (two) times daily.     hydrALAZINE 25 MG tablet  Commonly known as:  APRESOLINE  Take 1 tablet (25 mg total) by mouth 2 (two) times daily.     isosorbide mononitrate 30 MG 24 hr tablet  Commonly known as:  IMDUR  Take 1 tablet (30 mg total) by mouth daily.     lisinopril 5 MG tablet  Commonly known as:  PRINIVIL,ZESTRIL  Take 1 tablet (5 mg total) by mouth daily.     Melatonin 5 MG Caps  Take 1 capsule by mouth at bedtime.     memantine 28 MG Cp24 24 hr capsule  Commonly known as:  NAMENDA XR  Take 28 mg by mouth daily.     mirtazapine 15 MG tablet    Commonly known as:  REMERON  Take 15 mg by mouth at bedtime.     Rivaroxaban 15 MG Tabs tablet  Commonly known as:  XARELTO  Take 1 tablet (15 mg total) by mouth daily with supper.     senna-docusate 8.6-50 MG tablet  Commonly known as:  Senokot-S  Take 2 tablets by mouth at bedtime.     simvastatin 20 MG tablet  Commonly known as:  ZOCOR  TAKE 1 TABLET BY MOUTH AT BEDTIME FOR CHOLESTEROL     spironolactone 25 MG tablet  Commonly known as:  ALDACTONE  Take 0.5 tablets (12.5 mg total) by mouth daily.     TYLENOL 325 MG tablet  Generic drug:  acetaminophen  Take 325 mg by mouth every 6 (six) hours as needed for mild pain or moderate pain.        Review of Systems  Constitutional: Negative for fever, activity change, appetite change and fatigue.  HENT: Negative for congestion.   Eyes: Negative for discharge.  Respiratory: Positive for shortness of breath. Negative for cough, chest tightness and wheezing.   Cardiovascular: Negative for chest pain.  Gastrointestinal: Negative for nausea, vomiting, abdominal pain and constipation.  Genitourinary: Negative for dysuria.  Musculoskeletal: Negative for back pain and neck pain.  Skin: Negative for rash.  Neurological: Negative for dizziness, weakness, light-headedness and headaches.  Psychiatric/Behavioral: Positive for sleep disturbance. The patient is not nervous/anxious.     Immunization History  Administered Date(s) Administered  . Influenza Whole 06/02/2012, 06/01/2013  . Influenza-Unspecified 06/18/2014, 06/22/2015  . Pneumococcal Conjugate-13 07/26/2015  . Pneumococcal Polysaccharide-23 09/03/2007  . Td 09/03/2007   Pertinent  Health Maintenance Due  Topic Date Due  . INFLUENZA VACCINE  04/02/2016  . PNA vac Low Risk Adult  Completed   Fall Risk  08/10/2015 10/11/2013  Falls in the past year? Yes No  Number falls in past yr: 1 -  Injury with Fall? No -  Risk for fall due to : History of fall(s) -  Follow up Falls  evaluation completed -   Functional Status Survey:   Wt Readings from Last 3 Encounters:  11/16/15 153 lb 6.4 oz (69.582 kg)  10/18/15 153 lb (69.4 kg)  10/12/15 148 lb (67.132 kg)   Filed Vitals:   11/16/15 1610  BP: 119/72  Pulse: 75  Temp: 97.3 F (36.3 C)  Resp: 18  Weight: 153 lb 6.4 oz (69.582 kg)  SpO2: 95%   Body mass index is 19.69 kg/(m^2). Physical Exam  Constitutional: No distress.  HENT:  Head: Normocephalic and atraumatic.  Eyes: Right eye exhibits no discharge. Left eye exhibits no discharge.  Neck: No JVD present.  Pulmonary/Chest: He has no wheezes. He exhibits no tenderness.  Diminished breath sounds in lower lobes. Fine crackles auscultated to bases.   Abdominal: Soft. There is no tenderness.  Musculoskeletal: He exhibits no edema or tenderness.  Lymphadenopathy:    He has no cervical adenopathy.  Neurological: He is alert.  Skin: Skin is warm and dry. No rash noted. He is not diaphoretic.  Psychiatric: He has a normal mood and affect.  Nursing note and vitals reviewed.   Labs reviewed:  Recent Labs  12/19/14 0125  05/22/15 0109 05/23/15 0117  06/07/15 1326  09/28/15 10/01/15 10/13/15  NA 141  < > 140 139  < > 137  < > 143 142 143  K 4.1  < > 4.0 3.5  < > 4.3  < > 5.0 4.7 4.7  CL 106  < > 101 102  --  103  --   --   --   --   CO2 23  < > 30 25  --  24  --   --   --   --   GLUCOSE 117*  < > 109* 96  --  99  --   --   --   --   BUN 48*  < > 35* 38*  < > 30*  < > 39* 39* 35*  CREATININE 1.86*  < > 1.91* 1.89*  < > 1.38*  < > 1.8* 1.7* 1.9*  CALCIUM 9.2  < > 9.1 8.8*  --  9.0  --   --   --   --   MG 2.2  --   --   --   --   --   --   --   --   --   < > = values in this interval not displayed.  Recent Labs  12/19/14 1923 12/21/14 0736 12/29/14 03/10/15 1807 06/30/15  AST 199* 106* 27 48* 18  ALT 257* 199* 38 51 19  ALKPHOS 65 63 42 70 64  BILITOT 2.1* 1.9*  --  1.2  --   PROT 6.5 6.6  --  6.9  --   ALBUMIN 3.7 3.6  --  3.8  --      Recent Labs  12/18/14 1411  03/10/15 1807 03/11/15 0425  05/18/15 1616 05/21/15 0800 09/28/15 10/13/15  WBC 5.7  < > 8.7 6.7  < > 5.3 5.2 6.4 6.8  NEUTROABS 4.5  --  7.5  --   --  4.1  --   --   --   HGB 13.3  < > 13.5 13.1  < > 13.3 11.7* 13.3* 13.3*  HCT 40.1  < > 40.5 40.7  < > 41.0 36.8* 41 40*  MCV 96.9  < > 95.5 94.9  --  96.7 96.8  --   --   PLT 108*  < > 151 144*  < > 125.0* 113* 165 150  < > = values in this interval not displayed. Lab Results  Component Value Date   TSH 2.18 10/13/2015   Lab Results  Component Value Date   HGBA1C * 01/13/2010    5.7 (NOTE)                                                                       According to the ADA Clinical Practice Recommendations for 2011, when HbA1c is used as a screening test:   >=6.5%   Diagnostic of Diabetes Mellitus           (if abnormal result  is confirmed)  5.7-6.4%   Increased risk of developing Diabetes Mellitus  References:Diagnosis and Classification of Diabetes Mellitus,Diabetes [id number] 1):S62-S69 and Standards of Medical Care in         Diabetes - 2011,Diabetes [id number]  (Suppl 1):S11-S61.   Lab Results  Component Value Date   CHOL 96 06/30/2015   HDL 35 06/30/2015   LDLCALC 49 06/30/2015   TRIG 61 06/30/2015   CHOLHDL 2.3 01/13/2010    Significant Diagnostic Results in last 30 days:  No results found.  Assessment/Plan 1. Insomnia -Uncontrolled -would continue remeron for agitation, depression. -Start Restoril 7.5 mg qhs, may increase to 15 mg if no improvement after one week -Have staff encourage day time activities and wakefulness  2. Chronic congestive heart failure, unspecified congestive heart failure type (HCC) -Stable -Continue Lasix 40 mg dailly -Continue Aldactone  -weights are trending upward  but has been on remeron which increases appetite, no SOB or edema  3. Vascular dementia, with behavioral disturbance -Stable -Continue Namenda for memory problems.   4.  Essential hypertension Stable. Controlled.  Continue Coreg, Zestril and apresoline     Family/ staff Communication: discussed with nsg supervisor  Labs/tests ordered:  NA   Cindi Carbon, [place] [phone]

## 2015-11-21 ENCOUNTER — Non-Acute Institutional Stay (SKILLED_NURSING_FACILITY): Payer: Medicare Other | Admitting: Internal Medicine

## 2015-11-21 ENCOUNTER — Ambulatory Visit (INDEPENDENT_AMBULATORY_CARE_PROVIDER_SITE_OTHER): Payer: Medicare Other

## 2015-11-21 ENCOUNTER — Encounter: Payer: Self-pay | Admitting: Internal Medicine

## 2015-11-21 DIAGNOSIS — Z789 Other specified health status: Secondary | ICD-10-CM | POA: Diagnosis not present

## 2015-11-21 DIAGNOSIS — R635 Abnormal weight gain: Secondary | ICD-10-CM

## 2015-11-21 DIAGNOSIS — I5022 Chronic systolic (congestive) heart failure: Secondary | ICD-10-CM

## 2015-11-21 DIAGNOSIS — G4700 Insomnia, unspecified: Secondary | ICD-10-CM

## 2015-11-21 DIAGNOSIS — Z95810 Presence of automatic (implantable) cardiac defibrillator: Secondary | ICD-10-CM

## 2015-11-21 DIAGNOSIS — I5032 Chronic diastolic (congestive) heart failure: Secondary | ICD-10-CM

## 2015-11-21 NOTE — Progress Notes (Signed)
EPIC Encounter for ICM Monitoring  Patient Name: Jacob Rosales is a 82 y.o. male Date: 11/21/2015 Primary Care Physican: Hollace Kinnier, DO Primary Cardiologist: Lovena Le Electrophysiologist: Lovena Le Dry Weight: 153 lb   Bi-V Pacing 97%      In the past month, have you:  1. Gained more than 2 pounds in a day or more than 5 pounds in a week? no  2. Had changes in your medications (with verification of current medications)? no  3. Had more shortness of breath than is usual for you? no  4. Limited your activity because of shortness of breath? no  5. Not been able to sleep because of shortness of breath? no  6. Had increased swelling in your feet or ankles? no  7. Had symptoms of dehydration (dizziness, dry mouth, increased thirst, decreased urine output) no  8. Had changes in sodium restriction? no  9. Been compliant with medication? Yes   ICM trend: 3 month view for 11/21/2015   ICM trend: 1 year view for 11/21/2015   Follow-up plan: ICM clinic phone appointment on 12/26/2015.  Thoracic impedance slightly above reference line which correlates to increase Furosemide to bid x 3 days on 11/14/2015.  Spoke with Tammy, [facility] Nurse taking care of patient.  She stated patient's weight has returned to baseline.  She stated he is doing fine at this time.   No changes today.    Copy of note sent to patient's primary care physician, primary cardiologist, and device following physician.  Rosalene Billings, RN, CCM 11/21/2015 10:21 AM

## 2015-11-21 NOTE — Progress Notes (Signed)
Patient ID: Jacob Rosales, male   DOB: [birth year], 82 y.o.   MRN: [id number]  Location:  [facility] Room Number: 107 Place of Service:  SNF ([phone]) Provider:   Hollace Kinnier, DO  Patient Care Team: Gayland Curry, DO as PCP - General (Geriatric Medicine) Jarome Matin, MD as Consulting Physician (Dermatology) Evans Lance, MD as Consulting Physician (Cardiology) [facility] Earnie Larsson, MD as Consulting Physician (Neurosurgery) Bjorn Loser, MD as Consulting Physician (Urology)  Extended Emergency Contact Information Primary Emergency Contact: Panning,[name] Address: [address]          [place], [place] [address] Johnnette Litter of [place] Phone: [phone] Relation: Spouse Secondary Emergency Contact: [place] of [place] Phone: [phone] Relation: Daughter  Code Status:  DNR Goals of care: Advanced Directive information Advanced Directives 12/06/2015  Does patient have an advance directive? Yes  Type of Advance Directive Out of facility DNR (pink MOST or yellow form);Healthcare Power of Attorney  Does patient want to make changes to advanced directive? No - Patient declined  Copy of advanced directive(s) in chart? Yes  Pre-existing out of facility DNR order (yellow form or pink MOST form) Yellow form placed in chart (order not valid for inpatient use);Pink MOST form placed in chart (order not valid for inpatient use)     Chief Complaint  Patient presents with  . Acute Visit    weight gain, sleeps during the day and awake at night    HPI:  Pt is a 82 y.o. male seen today for an acute visit for weight gain and insomnia.  He admits to extreme fatigue due to poor sleep at night, but sits sleeping in wheelchair all day.  Has been getting up at night and falling.  Since benzo started, he's already fallen again. Falls w/o meds, too due to advanced vascular parkinsonism.  No longer to be  ambulatory--is to use wheelchair.  Seems he's more depressed from this also.  Past Medical History  Diagnosis Date  . Complete AV block ([place])   . Chronic systolic heart failure ([place])     a. Echo 4/16:  EF 25%, diff HK, Ao sclerosis without stenosis, mild AI, mild dilation of aortic root, mod MR, mod LAE, mildly reduced RVSF, PASP 52 mmHg  . Cardiomyopathy, ischemic     a. s/p STJ CRTD  . CAD (coronary artery disease)     s/p CABG  . Prostate cancer ([place])     S/P radiation rx  . Osteoporosis     A/P Vertebral compression fx's  . Hypertension   . Dyslipidemia   . TIA (transient ischemic attack)        . Lumbago    . Cervicalgia    . Unspecified vitamin D deficiency    . Basal cell carcinoma of skin of lower limb, including hip    . Squamous cell carcinoma of skin of lower limb, including hip    . Impotence of organic origin    . Insomnia, unspecified    . Paroxysmal atrial fibrillation (HCC)      a. intol of coumadin - Rx with Aggrenox  . Fracture of C2 vertebra, closed ([place])     s/p cervical fusion 05/2013  . Memory loss 07/25/2014   Past Surgical History  Procedure Laterality Date  . Coronary artery bypass graft    . Hernia repair    . Kyphosis surgery    . Biv-icd  11/21/2010  [place] ICD Model#CD3231-40 [phone]  . Cardiac defibrillator placement    . Cyst excision  10/2012    back Dr. Wilhemina Bonito  . Cyst removal neck  12/2012    basel cell (right) Dr. Sarajane Jews  . Colonoscopy  2006    polyps benign  . Dexa  April 2010  . Posterior cervical fusion/foraminotomy N/A 05/21/2013    Procedure: Cervical One, Cervical Two, Cervical Three Posterior cervical fusion with lateral mass fixation;  Surgeon: Charlie Pitter, MD;  Location: [facility];  Service: Neurosurgery;  Laterality: N/A;  POSTERIOR CERVICAL FUSION/FORAMINOTOMY LEVEL 2  . Back surgery    . Coronary angioplasty      Allergies  Allergen Reactions  . Warfarin Sodium Other (See Comments)    REACTION:  Stomach bleeding  . Amlodipine Besylate Swelling      Medication List       This list is accurate as of: 11/21/15 11:59 PM.  Always use your most recent med list.               bisacodyl 5 MG EC tablet  Commonly known as:  DULCOLAX  Take 5 mg by mouth daily as needed for mild constipation or moderate constipation.     calcium carbonate 750 MG chewable tablet  Commonly known as:  TUMS EX  Chew 2 tablets by mouth 2 (two) times daily.     carvedilol 6.25 MG tablet  Commonly known as:  COREG  Take 6.25 mg by mouth 2 (two) times daily with a meal.     cholecalciferol 1000 units tablet  Commonly known as:  VITAMIN D  Take 1,000 Units by mouth daily.     feeding supplement Liqd  Take 1 Container by mouth 3 (three) times daily between meals.     furosemide 40 MG tablet  Commonly known as:  LASIX  Take 40 mg by mouth daily.     GUAIFENESIN PO  Take 400 mg by mouth 2 (two) times daily.     hydrALAZINE 25 MG tablet  Commonly known as:  APRESOLINE  Take 25 mg by mouth daily.     isosorbide mononitrate 30 MG 24 hr tablet  Commonly known as:  IMDUR  Take 1 tablet (30 mg total) by mouth daily.     lisinopril 5 MG tablet  Commonly known as:  PRINIVIL,ZESTRIL  Take 1 tablet (5 mg total) by mouth daily.     memantine 28 MG Cp24 24 hr capsule  Commonly known as:  NAMENDA XR  Take 28 mg by mouth daily.     mirtazapine 15 MG tablet  Commonly known as:  REMERON  Take 15 mg by mouth at bedtime.     Rivaroxaban 15 MG Tabs tablet  Commonly known as:  XARELTO  Take 1 tablet (15 mg total) by mouth daily with supper.     senna-docusate 8.6-50 MG tablet  Commonly known as:  Senokot-S  Take 2 tablets by mouth at bedtime.     simvastatin 20 MG tablet  Commonly known as:  ZOCOR  TAKE 1 TABLET BY MOUTH AT BEDTIME FOR CHOLESTEROL     spironolactone 25 MG tablet  Commonly known as:  ALDACTONE  Take 0.5 tablets (12.5 mg total) by mouth daily.     temazepam 7.5 MG capsule    Commonly known as:  RESTORIL  Take 7.5 mg by mouth at bedtime as needed for sleep. (11/16/15) if no improvement in 1 week switch to 15mg  Restoril  TYLENOL 325 MG tablet  Generic drug:  acetaminophen  Take 325 mg by mouth every 6 (six) hours as needed for mild pain or moderate pain.        Review of Systems  Constitutional: Negative for fever, chills and appetite change.  HENT: Negative for congestion.   Respiratory: Negative for cough and shortness of breath.   Cardiovascular: Negative for chest pain and leg swelling.  Gastrointestinal: Negative for abdominal pain.  Genitourinary: Negative for difficulty urinating.  Skin: Positive for pallor.  Neurological: Positive for tremors and weakness. Negative for dizziness and light-headedness.  Psychiatric/Behavioral: Positive for confusion and sleep disturbance.    Immunization History  Administered Date(s) Administered  . Influenza Whole 06/02/2012, 06/01/2013  . Influenza-Unspecified 06/18/2014, 06/22/2015  . Pneumococcal Conjugate-13 07/26/2015  . Pneumococcal Polysaccharide-23 09/03/2007  . Td 09/03/2007   Pertinent  Health Maintenance Due  Topic Date Due  . INFLUENZA VACCINE  04/02/2016  . PNA vac Low Risk Adult  Completed   Fall Risk  08/10/2015 10/11/2013  Falls in the past year? Yes No  Number falls in past yr: 1 -  Injury with Fall? No -  Risk for fall due to : History of fall(s) -  Follow up Falls evaluation completed -   Functional Status Survey: Is the patient deaf or have difficulty hearing?: Yes Does the patient have difficulty seeing, even when wearing glasses/contacts?: No Does the patient have difficulty concentrating, remembering, or making decisions?: Yes Does the patient have difficulty walking or climbing stairs?: Yes Does the patient have difficulty dressing or bathing?: Yes Does the patient have difficulty doing errands alone such as visiting a doctor's office or shopping?: Yes  Filed Vitals:    11/21/15 1346  BP: 135/85  Pulse: 75  Temp: 98.9 F (37.2 C)  TempSrc: Oral  Resp: 20  Height: 6\' 2"  (1.88 m)  Weight: 153 lb (69.4 kg)  SpO2: 97%   Body mass index is 19.64 kg/(m^2). Physical Exam  Constitutional: He appears well-developed and well-nourished. No distress.  Cardiovascular: Normal rate, regular rhythm, normal heart sounds and intact distal pulses.   Pulmonary/Chest: Effort normal and breath sounds normal. He has no rales.  Abdominal: Soft. Bowel sounds are normal. He exhibits distension. He exhibits no mass. There is no tenderness. There is no rebound and no guarding.  Musculoskeletal: Normal range of motion.  Neurological: He exhibits abnormal muscle tone.  Drowsy, resting in chair, had just been in activity  Skin: Skin is warm and dry. There is pallor.    Labs reviewed:  Recent Labs  05/23/15 0117  06/07/15 1326  10/01/15 10/13/15 12/06/15 0437  NA 139  < > 137  < > 142 143 147*  K 3.5  < > 4.3  < > 4.7 4.7 4.9  CL 102  --  103  --   --   --  110  CO2 25  --  24  --   --   --  24  GLUCOSE 96  --  99  --   --   --  98  BUN 38*  < > 30*  < > 39* 35* 49*  CREATININE 1.89*  < > 1.38*  < > 1.7* 1.9* 1.99*  CALCIUM 8.8*  --  9.0  --   --   --  9.7  < > = values in this interval not displayed.  Recent Labs  03/10/15 1807 06/30/15  AST 48* 18  ALT 51 19  ALKPHOS 70 64  BILITOT 1.2  --   PROT 6.9  --   ALBUMIN 3.8  --     Recent Labs  03/10/15 1807  05/18/15 1616 05/21/15 0800 09/28/15 10/13/15 12/06/15 0437  WBC 8.7  < > 5.3 5.2 6.4 6.8 6.0  NEUTROABS 7.5  --  4.1  --   --   --  4.7  HGB 13.5  < > 13.3 11.7* 13.3* 13.3* 13.8  HCT 40.5  < > 41.0 36.8* 41 40* 43.5  MCV 95.5  < > 96.7 96.8  --   --  96.7  PLT 151  < > 125.0* 113* 165 150 112*  < > = values in this interval not displayed. Lab Results  Component Value Date   TSH 2.18 10/13/2015   Lab Results  Component Value Date   HGBA1C * 01/13/2010    5.7 (NOTE)                                                                        According to the ADA Clinical Practice Recommendations for 2011, when HbA1c is used as a screening test:   >=6.5%   Diagnostic of Diabetes Mellitus           (if abnormal result  is confirmed)  5.7-6.4%   Increased risk of developing Diabetes Mellitus  References:Diagnosis and Classification of Diabetes Mellitus,Diabetes [id number] 1):S62-S69 and Standards of Medical Care in         Diabetes - 2011,Diabetes [id number]  (Suppl 1):S11-S61.   Lab Results  Component Value Date   CHOL 96 06/30/2015   HDL 35 06/30/2015   LDLCALC 49 06/30/2015   TRIG 61 06/30/2015   CHOLHDL 2.3 01/13/2010    Assessment/Plan 1. Chronic diastolic CHF (congestive heart failure), NYHA class 2 (HCC) -seems this is stable despite noted weight gain--seems like it's due to other po intake increases not volume overload as exam is unremarkable w/o jvd, edema or rales  2. Weight gain -pants are tight -suspect from food and inactivity, posture, no long ambulates, sits in wheelchair and sleeps all day even in activities  3. Insomnia -due to inactivity in daytime--needs someone with him to keep him awake all day so he'll sleep at night -is already falling w/o meds and does not need benzos to worsen the condition -d/c restoril -involve his family in keeping him awake in the daytime -could consider a stimulatory antidepressant in the daytime if he's not sleeping and best attempts are made to keep him awake  4. Advance directive in chart - DNR (Do Not Resuscitate)  Family/ staff Communication: discussed with nursing staff, manager and DNS  Labs/tests ordered:  No new

## 2015-11-23 ENCOUNTER — Telehealth: Payer: Self-pay

## 2015-11-23 NOTE — Telephone Encounter (Signed)
Received incoming call from wife.  She stated patient fell list night and injured his foot and has some bruises.  She stated his foot was swollen today and was not sure if it was related to fluid or fall.  She stated that is the only swelling and no other symptoms of fluid.  Explained transmission showed on 11/21/2015 showed fluid levels have been stabilized since extra Furosemide was given in last week.  Advised the [facility] Physician or NP would probably check patient for any change in condition.  She stated that would be fine.

## 2015-12-05 ENCOUNTER — Encounter: Payer: Self-pay | Admitting: Internal Medicine

## 2015-12-05 ENCOUNTER — Non-Acute Institutional Stay (SKILLED_NURSING_FACILITY): Payer: Medicare Other | Admitting: Internal Medicine

## 2015-12-05 DIAGNOSIS — G4700 Insomnia, unspecified: Secondary | ICD-10-CM

## 2015-12-05 DIAGNOSIS — G4720 Circadian rhythm sleep disorder, unspecified type: Secondary | ICD-10-CM

## 2015-12-05 DIAGNOSIS — F0151 Vascular dementia with behavioral disturbance: Secondary | ICD-10-CM

## 2015-12-05 NOTE — Progress Notes (Signed)
Patient ID: Jacob Rosales, male   DOB: 03-23-1928, 82 y.o.   MRN: [id number]  Location:  [facility] Room Number: [place]:  SNF (31)  Kyri Shader, DO  Patient Care Team: Gayland Curry, DO as PCP - General (Geriatric Medicine) Jarome Matin, MD as Consulting Physician (Dermatology) Evans Lance, MD as Consulting Physician (Cardiology) [facility] Earnie Larsson, MD as Consulting Physician (Neurosurgery) Bjorn Loser, MD as Consulting Physician (Urology)  Extended Emergency Contact Information Primary Emergency Contact: Danielson,[name] Address: [address]          [place], [place] [address] [place] Phone: [phone] Relation: Spouse Secondary Emergency Contact: [place] Phone: [phone] Relation: Daughter  Code Status:  DNR Goals of care: Advanced Directive information Advanced Directives 12/05/2015  Does patient have an advance directive? Yes  Type of Paramedic of [place];Living will;Out of facility DNR (pink MOST or yellow form)  Copy of advanced directive(s) in chart? Yes  Pre-existing out of facility DNR order (yellow form or pink MOST form) Yellow form placed in chart (order not valid for inpatient use)     Chief Complaint  Patient presents with  . Acute Visit    fell last night, was kneeling on floor by bed. Still not sleeping at night, sleeps during the day.    HPI:  Pt is a 82 y.o. male seen today for an acute visit for an acute visit due to continued insomnia with falls.  He does attend some activities, but falls asleep during them and is often seen sleeping in his chair in the hall.  He needs to be kept awake during the day to sleep at night.  I had asked staff to involve his wife in this process, but she is only able to come over a couple times per week.  She asked that he have something to sleep so he's not up  falling, but was previously started on temazepam and fell the very next night likely due to adverse effects of sedative/hypnotic medications.  Ativan no longer worked for him and melatonin was not successful.    When seen, his affect is flat and he is falling asleep in his chair.  Past Medical History  Diagnosis Date  . Complete AV block ([place])   . Chronic systolic heart failure ([place])     a. Echo 4/16:  EF 25%, diff HK, Ao sclerosis without stenosis, mild AI, mild dilation of aortic root, mod MR, mod LAE, mildly reduced RVSF, PASP 52 mmHg  . Cardiomyopathy, ischemic     a. s/p STJ CRTD  . CAD (coronary artery disease)     s/p CABG  . Prostate cancer ([place])     S/P radiation rx  . Osteoporosis     A/P Vertebral compression fx's  . Hypertension   . Dyslipidemia   . TIA (transient ischemic attack)        . Lumbago    . Cervicalgia    . Unspecified vitamin D deficiency    . Basal cell carcinoma of skin of lower limb, including hip    . Squamous cell carcinoma of skin of lower limb, including hip    . Impotence of organic origin    . Insomnia, unspecified    . Paroxysmal atrial fibrillation ([facility])      a. intol of coumadin - Rx with Aggrenox  . Fracture of C2 vertebra, closed ([place])  s/p cervical fusion 05/2013   Past Surgical History  Procedure Laterality Date  . Coronary artery bypass graft    . Hernia repair    . Kyphosis surgery    . Biv-icd  11/21/2010    [employer] ICD Model#CD3231-40 [phone]  . Cardiac defibrillator placement    . Cyst excision  10/2012    back Dr. Wilhemina Bonito  . Cyst removal neck  12/2012    basel cell (right) Dr. Sarajane Jews  . Colonoscopy  2006    polyps benign  . Dexa  April 2010  . Posterior cervical fusion/foraminotomy N/A 05/21/2013    Procedure: Cervical One, Cervical Two, Cervical Three Posterior cervical fusion with lateral mass fixation;  Surgeon: Charlie Pitter, MD;  Location: [facility];  Service: Neurosurgery;  Laterality: N/A;  POSTERIOR  CERVICAL FUSION/FORAMINOTOMY LEVEL 2  . Back surgery    . Coronary angioplasty      Allergies  Allergen Reactions  . Warfarin Sodium Other (See Comments)    REACTION: Stomach bleeding  . Amlodipine Besylate Swelling      Medication List       This list is accurate as of: 12/05/15  2:14 PM.  Always use your most recent med list.               bisacodyl 5 MG EC tablet  Commonly known as:  DULCOLAX  Take 5 mg by mouth daily as needed for mild constipation or moderate constipation.     calcium carbonate 750 MG chewable tablet  Commonly known as:  TUMS EX  Chew 2 tablets by mouth 2 (two) times daily.     carvedilol 6.25 MG tablet  Commonly known as:  COREG  Take 6.25 mg by mouth 2 (two) times daily with a meal.     cholecalciferol 1000 units tablet  Commonly known as:  VITAMIN D  Take 1,000 Units by mouth daily.     feeding supplement Liqd  Take 1 Container by mouth 3 (three) times daily between meals.     furosemide 40 MG tablet  Commonly known as:  LASIX  Take 40 mg by mouth daily.     GUAIFENESIN PO  Take 400 mg by mouth 2 (two) times daily.     hydrALAZINE 25 MG tablet  Commonly known as:  APRESOLINE  Take 25 mg by mouth daily.     isosorbide mononitrate 30 MG 24 hr tablet  Commonly known as:  IMDUR  Take 1 tablet (30 mg total) by mouth daily.     lisinopril 5 MG tablet  Commonly known as:  PRINIVIL,ZESTRIL  Take 1 tablet (5 mg total) by mouth daily.     memantine 28 MG Cp24 24 hr capsule  Commonly known as:  NAMENDA XR  Take 28 mg by mouth daily.     mirtazapine 15 MG tablet  Commonly known as:  REMERON  Take 15 mg by mouth at bedtime.     Rivaroxaban 15 MG Tabs tablet  Commonly known as:  XARELTO  Take 1 tablet (15 mg total) by mouth daily with supper.     senna-docusate 8.6-50 MG tablet  Commonly known as:  Senokot-S  Take 2 tablets by mouth at bedtime.     simvastatin 20 MG tablet  Commonly known as:  ZOCOR  TAKE 1 TABLET BY MOUTH AT  BEDTIME FOR CHOLESTEROL     spironolactone 25 MG tablet  Commonly known as:  ALDACTONE  Take 0.5 tablets (12.5 mg total) by  mouth daily.     TYLENOL 325 MG tablet  Generic drug:  acetaminophen  Take 325 mg by mouth every 6 (six) hours as needed for mild pain or moderate pain.       Review of Systems  Constitutional: Positive for activity change and fatigue. Negative for fever, appetite change and unexpected weight change.  HENT: Negative for congestion and sinus pressure.   Eyes:       Glasses  Respiratory: Negative for shortness of breath.   Cardiovascular: Negative for chest pain and palpitations.  Gastrointestinal: Negative for abdominal distention.  Genitourinary: Negative for dysuria.  Musculoskeletal: Positive for back pain.  Neurological: Positive for weakness.       Shuffling gait  Psychiatric/Behavioral: Positive for confusion, sleep disturbance and decreased concentration. Negative for hallucinations and agitation. The patient is not nervous/anxious.     Immunization History  Administered Date(s) Administered  . Influenza Whole 06/02/2012, 06/01/2013  . Influenza-Unspecified 06/18/2014, 06/22/2015  . Pneumococcal Conjugate-13 07/26/2015  . Pneumococcal Polysaccharide-23 09/03/2007  . Td 09/03/2007   Pertinent  Health Maintenance Due  Topic Date Due  . INFLUENZA VACCINE  04/02/2016  . PNA vac Low Risk Adult  Completed   Fall Risk  08/10/2015 10/11/2013  Falls in the past year? Yes No  Number falls in past yr: 1 -  Injury with Fall? No -  Risk for fall due to : History of fall(s) -  Follow up Falls evaluation completed -   Functional Status Survey: Is the patient deaf or have difficulty hearing?: Yes Does the patient have difficulty seeing, even when wearing glasses/contacts?: No Does the patient have difficulty concentrating, remembering, or making decisions?: Yes Does the patient have difficulty walking or climbing stairs?: Yes Does the patient have  difficulty dressing or bathing?: Yes Does the patient have difficulty doing errands alone such as visiting a doctor's office or shopping?: Yes  Filed Vitals:   12/05/15 1045  BP: 138/86  Pulse: 74  Temp: 98 F (36.7 C)  Resp: 20  Height: 6\' 2"  (1.88 m)  Weight: 156 lb (70.761 kg)  SpO2: 96%   Body mass index is 20.02 kg/(m^2). Physical Exam  Constitutional: He appears well-developed and well-nourished. No distress.  HENT:  Head: Normocephalic and atraumatic.  Cardiovascular: Intact distal pulses.   irreg irreg  Pulmonary/Chest: Effort normal and breath sounds normal. No respiratory distress.  Abdominal: Soft. Bowel sounds are normal. He exhibits no distension.  Musculoskeletal:  Mild rigidity during ROM  Neurological:  Easily aroused, but is drowsy and slouched down in his wheelchair  Skin: Skin is warm and dry. There is pallor.  Psychiatric:  Flat affect    Labs reviewed:  Recent Labs  12/19/14 0125  05/22/15 0109 05/23/15 0117  06/07/15 1326  09/28/15 10/01/15 10/13/15  NA 141  < > 140 139  < > 137  < > 143 142 143  K 4.1  < > 4.0 3.5  < > 4.3  < > 5.0 4.7 4.7  CL 106  < > 101 102  --  103  --   --   --   --   CO2 23  < > 30 25  --  24  --   --   --   --   GLUCOSE 117*  < > 109* 96  --  99  --   --   --   --   BUN 48*  < > 35* 38*  < > 30*  < >  39* 39* 35*  CREATININE 1.86*  < > 1.91* 1.89*  < > 1.38*  < > 1.8* 1.7* 1.9*  CALCIUM 9.2  < > 9.1 8.8*  --  9.0  --   --   --   --   MG 2.2  --   --   --   --   --   --   --   --   --   < > = values in this interval not displayed.  Recent Labs  12/19/14 1923 12/21/14 0736 12/29/14 03/10/15 1807 06/30/15  AST 199* 106* 27 48* 18  ALT 257* 199* 38 51 19  ALKPHOS 65 63 42 70 64  BILITOT 2.1* 1.9*  --  1.2  --   PROT 6.5 6.6  --  6.9  --   ALBUMIN 3.7 3.6  --  3.8  --     Recent Labs  12/18/14 1411  03/10/15 1807 03/11/15 0425  05/18/15 1616 05/21/15 0800 09/28/15 10/13/15  WBC 5.7  < > 8.7 6.7  < > 5.3  5.2 6.4 6.8  NEUTROABS 4.5  --  7.5  --   --  4.1  --   --   --   HGB 13.3  < > 13.5 13.1  < > 13.3 11.7* 13.3* 13.3*  HCT 40.1  < > 40.5 40.7  < > 41.0 36.8* 41 40*  MCV 96.9  < > 95.5 94.9  --  96.7 96.8  --   --   PLT 108*  < > 151 144*  < > 125.0* 113* 165 150  < > = values in this interval not displayed. Lab Results  Component Value Date   TSH 2.18 10/13/2015   Lab Results  Component Value Date   HGBA1C * 01/13/2010    5.7 (NOTE)                                                                       According to the ADA Clinical Practice Recommendations for 2011, when HbA1c is used as a screening test:   >=6.5%   Diagnostic of Diabetes Mellitus           (if abnormal result  is confirmed)  5.7-6.4%   Increased risk of developing Diabetes Mellitus  References:Diagnosis and Classification of Diabetes Mellitus,Diabetes [id number] 1):S62-S69 and Standards of Medical Care in         Diabetes - 2011,Diabetes [id number]  (Suppl 1):S11-S61.   Lab Results  Component Value Date   CHOL 96 06/30/2015   HDL 35 06/30/2015   LDLCALC 49 06/30/2015   TRIG 61 06/30/2015   CHOLHDL 2.3 01/13/2010    Assessment/Plan 1. Insomnia -has not responded well to the typical sedative hypnotics and has a very high fall risk even without them on board -will see if treating his underlying depression with wellbutrin might stimulate him in the daytime so he'll sleep at night -if not, try belsomra which hopefully will be affordable for him considering he's failed everything else and it's said to be safer in his age group anyway--if low dose inadequate, will have to titrate up  2. Sleep - wake disorder -seems he's got one as a result of prior strokes and dementia -has failed  typical sleep agents, see #1  3. Vascular dementia, with behavioral disturbance -is gradually progressing--seems more rapid since move to SNF where he's away from his wife and with all of his sleeping  Family/ staff  Communication: discussed with snf nursing  Labs/tests ordered:  No new

## 2015-12-06 ENCOUNTER — Encounter (HOSPITAL_COMMUNITY): Payer: Self-pay | Admitting: Emergency Medicine

## 2015-12-06 ENCOUNTER — Emergency Department (HOSPITAL_COMMUNITY)
Admission: EM | Admit: 2015-12-06 | Discharge: 2015-12-06 | Disposition: A | Discharge status: Home / Self Care | Payer: Medicare Other | Attending: Emergency Medicine | Admitting: Emergency Medicine

## 2015-12-06 DIAGNOSIS — I2510 Atherosclerotic heart disease of native coronary artery without angina pectoris: Secondary | ICD-10-CM | POA: Diagnosis not present

## 2015-12-06 DIAGNOSIS — I480 Paroxysmal atrial fibrillation: Secondary | ICD-10-CM | POA: Diagnosis not present

## 2015-12-06 DIAGNOSIS — Z8781 Personal history of (healed) traumatic fracture: Secondary | ICD-10-CM | POA: Diagnosis not present

## 2015-12-06 DIAGNOSIS — Z85828 Personal history of other malignant neoplasm of skin: Secondary | ICD-10-CM | POA: Diagnosis not present

## 2015-12-06 DIAGNOSIS — Z8669 Personal history of other diseases of the nervous system and sense organs: Secondary | ICD-10-CM | POA: Diagnosis not present

## 2015-12-06 DIAGNOSIS — Z79899 Other long term (current) drug therapy: Secondary | ICD-10-CM | POA: Insufficient documentation

## 2015-12-06 DIAGNOSIS — Z8546 Personal history of malignant neoplasm of prostate: Secondary | ICD-10-CM | POA: Diagnosis not present

## 2015-12-06 DIAGNOSIS — Z7901 Long term (current) use of anticoagulants: Secondary | ICD-10-CM | POA: Insufficient documentation

## 2015-12-06 DIAGNOSIS — M810 Age-related osteoporosis without current pathological fracture: Secondary | ICD-10-CM | POA: Diagnosis not present

## 2015-12-06 DIAGNOSIS — E785 Hyperlipidemia, unspecified: Secondary | ICD-10-CM | POA: Insufficient documentation

## 2015-12-06 DIAGNOSIS — F0390 Unspecified dementia without behavioral disturbance: Secondary | ICD-10-CM | POA: Insufficient documentation

## 2015-12-06 DIAGNOSIS — I5022 Chronic systolic (congestive) heart failure: Secondary | ICD-10-CM | POA: Insufficient documentation

## 2015-12-06 DIAGNOSIS — E559 Vitamin D deficiency, unspecified: Secondary | ICD-10-CM | POA: Diagnosis not present

## 2015-12-06 DIAGNOSIS — N3091 Cystitis, unspecified with hematuria: Secondary | ICD-10-CM

## 2015-12-06 DIAGNOSIS — R05 Cough: Secondary | ICD-10-CM | POA: Insufficient documentation

## 2015-12-06 DIAGNOSIS — I10 Essential (primary) hypertension: Secondary | ICD-10-CM | POA: Diagnosis not present

## 2015-12-06 DIAGNOSIS — Z8673 Personal history of transient ischemic attack (TIA), and cerebral infarction without residual deficits: Secondary | ICD-10-CM | POA: Insufficient documentation

## 2015-12-06 DIAGNOSIS — Z951 Presence of aortocoronary bypass graft: Secondary | ICD-10-CM | POA: Insufficient documentation

## 2015-12-06 DIAGNOSIS — R319 Hematuria, unspecified: Secondary | ICD-10-CM | POA: Diagnosis present

## 2015-12-06 DIAGNOSIS — Z9861 Coronary angioplasty status: Secondary | ICD-10-CM | POA: Insufficient documentation

## 2015-12-06 LAB — CBC WITH DIFFERENTIAL/PLATELET
BASOS ABS: 0 10*3/uL (ref 0.0–0.1)
Basophils Relative: 0 %
EOS ABS: 0.1 10*3/uL (ref 0.0–0.7)
EOS PCT: 2 %
HCT: 43.5 % (ref 39.0–52.0)
Hemoglobin: 13.8 g/dL (ref 13.0–17.0)
LYMPHS PCT: 11 %
Lymphs Abs: 0.6 10*3/uL — ABNORMAL LOW (ref 0.7–4.0)
MCH: 30.7 pg (ref 26.0–34.0)
MCHC: 31.7 g/dL (ref 30.0–36.0)
MCV: 96.7 fL (ref 78.0–100.0)
MONO ABS: 0.5 10*3/uL (ref 0.1–1.0)
Monocytes Relative: 9 %
Neutro Abs: 4.7 10*3/uL (ref 1.7–7.7)
Neutrophils Relative %: 79 %
PLATELETS: 112 10*3/uL — AB (ref 150–400)
RBC: 4.5 MIL/uL (ref 4.22–5.81)
RDW: 18.2 % — AB (ref 11.5–15.5)
WBC: 6 10*3/uL (ref 4.0–10.5)

## 2015-12-06 LAB — URINALYSIS, ROUTINE W REFLEX MICROSCOPIC
BILIRUBIN URINE: NEGATIVE
GLUCOSE, UA: NEGATIVE mg/dL
Ketones, ur: NEGATIVE mg/dL
Nitrite: NEGATIVE
PH: 6.5 (ref 5.0–8.0)
Protein, ur: >300 mg/dL — AB
SPECIFIC GRAVITY, URINE: 1.021 (ref 1.005–1.030)

## 2015-12-06 LAB — BASIC METABOLIC PANEL
Anion gap: 13 (ref 5–15)
BUN: 49 mg/dL — AB (ref 6–20)
CO2: 24 mmol/L (ref 22–32)
CREATININE: 1.99 mg/dL — AB (ref 0.61–1.24)
Calcium: 9.7 mg/dL (ref 8.9–10.3)
Chloride: 110 mmol/L (ref 101–111)
GFR calc Af Amer: 34 mL/min — ABNORMAL LOW (ref >60)
GFR, EST NON AFRICAN AMERICAN: 29 mL/min — AB (ref >60)
GLUCOSE: 98 mg/dL (ref 65–99)
POTASSIUM: 4.9 mmol/L (ref 3.5–5.1)
SODIUM: 147 mmol/L — AB (ref 135–145)

## 2015-12-06 LAB — URINE MICROSCOPIC-ADD ON: Squamous Epithelial / LPF: NONE SEEN

## 2015-12-06 LAB — I-STAT CG4 LACTIC ACID, ED: LACTIC ACID, VENOUS: 0.8 mmol/L (ref 0.5–2.0)

## 2015-12-06 MED ORDER — CEPHALEXIN 500 MG PO CAPS
500.0000 mg | ORAL_CAPSULE | Freq: Once | ORAL | Status: AC
  Administered 2015-12-06: 500 mg via ORAL
  Filled 2015-12-06: qty 1

## 2015-12-06 MED ORDER — ACETAMINOPHEN 325 MG PO TABS
650.0000 mg | ORAL_TABLET | Freq: Once | ORAL | Status: AC
  Administered 2015-12-06: 650 mg via ORAL
  Filled 2015-12-06: qty 2

## 2015-12-06 MED ORDER — DEXTROSE 5 % IV SOLN: 1.0000 g | Freq: Once | INTRAVENOUS | Status: DC

## 2015-12-06 MED ORDER — CEPHALEXIN 500 MG PO CAPS: 500.0000 mg | ORAL_CAPSULE | Freq: Four times a day (QID) | ORAL | Status: DC

## 2015-12-06 MED ORDER — CEPHALEXIN 500 MG PO CAPS: 500.0000 mg | ORAL_CAPSULE | Freq: Two times a day (BID) | ORAL | Status: DC

## 2015-12-06 MED ORDER — RESOURCE THICKENUP CLEAR PO POWD
ORAL | Status: DC | PRN
  Filled 2015-12-06: qty 125

## 2015-12-06 NOTE — Discharge Instructions (Signed)
Get plenty of rest, and drink a lot of fluids. Start taking the antibiotic prescription today. Use Tylenol if needed for pain or fever.    Urinary Tract Infection Urinary tract infections (UTIs) can develop anywhere along your urinary tract. Your urinary tract is your body's drainage system for removing wastes and extra water. Your urinary tract includes two kidneys, two ureters, a bladder, and a urethra. Your kidneys are a pair of bean-shaped organs. Each kidney is about the size of your fist. They are located below your ribs, one on each side of your spine. CAUSES Infections are caused by microbes, which are microscopic organisms, including fungi, viruses, and bacteria. These organisms are so small that they can only be seen through a microscope. Bacteria are the microbes that most commonly cause UTIs. SYMPTOMS  Symptoms of UTIs may vary by age and gender of the patient and by the location of the infection. Symptoms in young women typically include a frequent and intense urge to urinate and a painful, burning feeling in the bladder or urethra during urination. Older women and men are more likely to be tired, shaky, and weak and have muscle aches and abdominal pain. A fever may mean the infection is in your kidneys. Other symptoms of a kidney infection include pain in your back or sides below the ribs, nausea, and vomiting. DIAGNOSIS To diagnose a UTI, your caregiver will ask you about your symptoms. Your caregiver will also ask you to provide a urine sample. The urine sample will be tested for bacteria and white blood cells. White blood cells are made by your body to help fight infection. TREATMENT  Typically, UTIs can be treated with medication. Because most UTIs are caused by a bacterial infection, they usually can be treated with the use of antibiotics. The choice of antibiotic and length of treatment depend on your symptoms and the type of bacteria causing your infection. HOME CARE  INSTRUCTIONS  If you were prescribed antibiotics, take them exactly as your caregiver instructs you. Finish the medication even if you feel better after you have only taken some of the medication.  Drink enough water and fluids to keep your urine clear or pale yellow.  Avoid caffeine, tea, and carbonated beverages. They tend to irritate your bladder.  Empty your bladder often. Avoid holding urine for long periods of time.  Empty your bladder before and after sexual intercourse.  After a bowel movement, women should cleanse from front to back. Use each tissue only once. SEEK MEDICAL CARE IF:   You have back pain.  You develop a fever.  Your symptoms do not begin to resolve within 3 days. SEEK IMMEDIATE MEDICAL CARE IF:   You have severe back pain or lower abdominal pain.  You develop chills.  You have nausea or vomiting.  You have continued burning or discomfort with urination. MAKE SURE YOU:   Understand these instructions.  Will watch your condition.  Will get help right away if you are not doing well or get worse.   This information is not intended to replace advice given to you by your health care provider. Make sure you discuss any questions you have with your health care provider.   Document Released: 05/29/2005 Document Revised: 05/10/2015 Document Reviewed: 09/27/2011 Elsevier Interactive Patient Education [employer].

## 2015-12-06 NOTE — ED Notes (Signed)
Two unsuccessful IV attempts by this nurse

## 2015-12-06 NOTE — ED Notes (Signed)
Pt BIB EMS from [facility] for hematuria; pt is continent and had one episode of bright red urine at around 2am; pt denies pain.

## 2015-12-06 NOTE — ED Notes (Signed)
MD at bedside. 

## 2015-12-06 NOTE — ED Notes (Signed)
Pt reminded of need for urine sample.  

## 2015-12-06 NOTE — ED Notes (Signed)
Bed: [id number] Expected date:  Expected time:  Means of arrival:  Comments: EMS 82yo M hematuria x 1

## 2015-12-06 NOTE — ED Notes (Signed)
Patient is on nectar thick liquid.

## 2015-12-06 NOTE — ED Provider Notes (Signed)
CSN: [id number]     Arrival date & time 12/06/15  0324 History   First MD Initiated Contact with Patient 12/06/15 [phone]     Chief Complaint  Patient presents with  . Hematuria     (Consider location/radiation/quality/duration/timing/severity/associated sxs/prior Treatment) HPI   Jacob Rosales is a 82 y.o. male presents for evaluation of hematuria which is reported to have occurred 1 today. Patient is unable to give additional history. Daughter is here with the patient states that he has a chronic ongoing cough, which is nonproductive. She does not believe that he has had any other recent illnesses.  Level V caveat- dementia   Past Medical History  Diagnosis Date  . Complete AV block ([place])   . Chronic systolic heart failure ([place])     a. Echo 4/16:  EF 25%, diff HK, Ao sclerosis without stenosis, mild AI, mild dilation of aortic root, mod MR, mod LAE, mildly reduced RVSF, PASP 52 mmHg  . Cardiomyopathy, ischemic     a. s/p STJ CRTD  . CAD (coronary artery disease)     s/p CABG  . Prostate cancer ([place])     S/P radiation rx  . Osteoporosis     A/P Vertebral compression fx's  . Hypertension   . Dyslipidemia   . TIA (transient ischemic attack)        . Lumbago    . Cervicalgia    . Unspecified vitamin D deficiency    . Basal cell carcinoma of skin of lower limb, including hip    . Squamous cell carcinoma of skin of lower limb, including hip    . Impotence of organic origin    . Insomnia, unspecified    . Paroxysmal atrial fibrillation (HCC)      a. intol of coumadin - Rx with Aggrenox  . Fracture of C2 vertebra, closed ([place])     s/p cervical fusion 05/2013   Past Surgical History  Procedure Laterality Date  . Coronary artery bypass graft    . Hernia repair    . Kyphosis surgery    . Biv-icd  11/21/2010    [employer] ICD Model#CD3231-40 [phone]  . Cardiac defibrillator placement    . Cyst excision  10/2012    back Dr. Wilhemina Bonito  . Cyst removal neck  12/2012     basel cell (right) Dr. Sarajane Jews  . Colonoscopy  2006    polyps benign  . Dexa  April 2010  . Posterior cervical fusion/foraminotomy N/A 05/21/2013    Procedure: Cervical One, Cervical Two, Cervical Three Posterior cervical fusion with lateral mass fixation;  Surgeon: Charlie Pitter, MD;  Location: [facility];  Service: Neurosurgery;  Laterality: N/A;  POSTERIOR CERVICAL FUSION/FORAMINOTOMY LEVEL 2  . Back surgery    . Coronary angioplasty     Family History  Problem Relation Age of Onset  . Coronary artery disease Neg Hx   . Heart disease Mother    Social History  Substance Use Topics  . Smoking status: Never Smoker   . Smokeless tobacco: Never Used  . Alcohol Use: 1.2 oz/week    1 Glasses of wine, 1 Cans of beer per week    Review of Systems  All other systems reviewed and are negative.     Allergies  Warfarin sodium and Amlodipine besylate  Home Medications   Prior to Admission medications   Medication Sig Start Date End Date Taking? Authorizing Provider  acetaminophen (TYLENOL) 325 MG tablet Take 325 mg by mouth  every 6 (six) hours as needed for mild pain or moderate pain.   Yes Historical Provider, MD  bisacodyl (DULCOLAX) 5 MG EC tablet Take 5 mg by mouth daily as needed for mild constipation or moderate constipation.   Yes Historical Provider, MD  calcium carbonate (TUMS EX) 750 MG chewable tablet Chew 2 tablets by mouth 2 (two) times daily.   Yes Historical Provider, MD  carvedilol (COREG) 6.25 MG tablet Take 6.25 mg by mouth 2 (two) times daily with a meal.   Yes Historical Provider, MD  cholecalciferol (VITAMIN D) 1000 UNITS tablet Take 1,000 Units by mouth daily.    Yes Historical Provider, MD  feeding supplement (BOOST / RESOURCE BREEZE) LIQD Take 1 Container by mouth 3 (three) times daily between meals.   Yes Historical Provider, MD  furosemide (LASIX) 40 MG tablet Take 40 mg by mouth daily.  06/23/15  Yes Historical Provider, MD  GUAIFENESIN PO Take 400 mg by mouth  2 (two) times daily.   Yes Historical Provider, MD  hydrALAZINE (APRESOLINE) 25 MG tablet Take 25 mg by mouth daily.   Yes Historical Provider, MD  isosorbide mononitrate (IMDUR) 30 MG 24 hr tablet Take 1 tablet (30 mg total) by mouth daily. 07/26/15  Yes Evans Lance, MD  lisinopril (PRINIVIL,ZESTRIL) 5 MG tablet Take 1 tablet (5 mg total) by mouth daily. 10/18/15  Yes Liliane Shi, PA-C  memantine (NAMENDA XR) 28 MG CP24 24 hr capsule Take 28 mg by mouth daily.   Yes Historical Provider, MD  mirtazapine (REMERON) 15 MG tablet Take 15 mg by mouth at bedtime.   Yes Historical Provider, MD  Rivaroxaban (XARELTO) 15 MG TABS tablet Take 1 tablet (15 mg total) by mouth daily with supper. 09/12/15  Yes Tiffany L Reed, DO  senna-docusate (SENOKOT-S) 8.6-50 MG per tablet Take 2 tablets by mouth at bedtime.  05/24/13  Yes Claudette T Levie Heritage, NP  simvastatin (ZOCOR) 20 MG tablet TAKE 1 TABLET BY MOUTH AT BEDTIME FOR CHOLESTEROL 12/01/14  Yes Tiffany L Reed, DO  spironolactone (ALDACTONE) 25 MG tablet Take 0.5 tablets (12.5 mg total) by mouth daily. 05/18/15  Yes Eileen Stanford, PA-C  cephALEXin (KEFLEX) 500 MG capsule Take 1 capsule (500 mg total) by mouth 4 (four) times daily. 12/06/15   Daleen Bo, MD   BP 116/67 mmHg  Pulse 75  Temp(Src) 99.3 F (37.4 C) (Oral)  Resp 16  SpO2 96% Physical Exam  Constitutional: He appears well-developed.  Elderly, frail  HENT:  Head: Normocephalic and atraumatic.  Right Ear: External ear normal.  Left Ear: External ear normal.  Eyes: Conjunctivae and EOM are normal. Pupils are equal, round, and reactive to light.  Small amount of drainage, right eye, clears with simple wiping.  Neck: Normal range of motion and phonation normal. Neck supple.  Cardiovascular: Normal rate, regular rhythm and normal heart sounds.   Pulmonary/Chest: Effort normal and breath sounds normal. He exhibits no bony tenderness.  Occasional nonproductive cough.  Abdominal: Soft. There  is no tenderness.  Genitourinary:  Normal penis, scrotum and scrotal contents. Small amount of blood on the patient's diaper.  Musculoskeletal: Normal range of motion. He exhibits edema (2+ peripheral edema in lower legs.).  Neurological: He is alert. No cranial nerve deficit or sensory deficit. He exhibits normal muscle tone. Coordination normal.  Skin: Skin is warm, dry and intact.  Psychiatric: He has a normal mood and affect. His behavior is normal.  Nursing note and vitals reviewed.  ED Course  Procedures (including critical care time)  Medications  RESOURCE THICKENUP CLEAR (not administered)  cephALEXin (KEFLEX) capsule 500 mg (not administered)  acetaminophen (TYLENOL) tablet 650 mg (650 mg Oral Given 12/06/15 0520)    Patient Vitals for the past 24 hrs:  BP Temp Temp src Pulse Resp SpO2  12/06/15 0700 116/67 mmHg - - 75 - 96 %  12/06/15 0657 109/69 mmHg - - 75 16 90 %  12/06/15 0600 113/67 mmHg - - 73 - 94 %  12/06/15 0530 141/83 mmHg - - 80 - 96 %  12/06/15 0500 138/80 mmHg - - 74 - 97 %  12/06/15 0440 137/90 mmHg 99.3 F (37.4 C) - 75 - 98 %  12/06/15 0430 - - - 74 - 100 %  12/06/15 0333 136/87 mmHg 98.5 F (36.9 C) Oral 74 16 99 %    7:38 AM Reevaluation with update and discussion. After initial assessment and treatment, an updated evaluation reveals No change in clinical status. Patient's daughter on findings and all questions were answered. Belmira Daley L    Labs Review Labs Reviewed  URINALYSIS, ROUTINE W REFLEX MICROSCOPIC (NOT AT [facility]) - Abnormal; Notable for the following:    Color, Urine RED (*)    APPearance TURBID (*)    Hgb urine dipstick LARGE (*)    Protein, ur >300 (*)    Leukocytes, UA TRACE (*)    All other components within normal limits  BASIC METABOLIC PANEL - Abnormal; Notable for the following:    Sodium 147 (*)    BUN 49 (*)    Creatinine, Ser 1.99 (*)    GFR calc non Af Amer 29 (*)    GFR calc Af Amer 34 (*)    All other components  within normal limits  CBC WITH DIFFERENTIAL/PLATELET - Abnormal; Notable for the following:    RDW 18.2 (*)    Platelets 112 (*)    Lymphs Abs 0.6 (*)    All other components within normal limits  URINE MICROSCOPIC-ADD ON - Abnormal; Notable for the following:    Bacteria, UA MANY (*)    All other components within normal limits  URINE CULTURE  I-STAT CG4 LACTIC ACID, ED    Imaging Review No results found. I have personally reviewed and evaluated these images and lab results as part of my medical decision-making.   EKG Interpretation None      MDM   Final diagnoses:  Hemorrhagic cystitis    Urinary tract infection leading consistent with hemorrhagic cystitis. Patient is nontoxic, with normal lactate. Renal insufficiency is at baseline. Doubt metabolic instability or impending vascular collapse.  .Nursing Notes Reviewed/ Care Coordinated Applicable Imaging Reviewed Interpretation of Laboratory Data incorporated into ED treatment  The patient appears reasonably screened and/or stabilized for discharge and I doubt any other medical condition or other [facility] requiring further screening, evaluation, or treatment in the ED at this time prior to discharge.  Plan: Home Medications- Keflex; Home Treatments- rest, fluids; return here if the recommended treatment, does not improve the symptoms; Recommended follow up- PCP 1 week     Daleen Bo, MD 12/06/15 [phone]

## 2015-12-08 LAB — URINE CULTURE: SPECIAL REQUESTS: NORMAL

## 2015-12-09 ENCOUNTER — Telehealth: Payer: Self-pay

## 2015-12-09 NOTE — Telephone Encounter (Signed)
Post ED Visit - Positive Culture Follow-up: Successful Patient Follow-Up  Culture assessed and recommendations reviewed by: []  Elenor Quinones, Pharm.D. []  Heide Guile, Pharm.D., BCPS []  Parks Neptune, Pharm.D. []  Alycia Rossetti, Pharm.D., BCPS []  [place], [place].D., BCPS, AAHIVP []  Legrand Como, Pharm.D., BCPS, AAHIVP []  Cassie Stewart, Pharm.D. []  Stephens November, [place].D. Vincenza Hews PharmD Positive urine culture  []  Patient discharged without antimicrobial prescription and treatment is now indicated [x]  Organism is resistant to prescribed ED discharge antimicrobial []  Patient with positive blood cultures  Changes discussed with ED provider: Shirlyn Goltz New antibiotic prescription Amoxicillin 500mg  Bid x 7 days qty 14 Called to Faxed to [facility] per request of Lynelle Smoke, [phone]  Contacted patient, date 12/09/215, time 1033   Mayo Owczarzak, Carolynn Comment 12/09/2015, 10:30 AM

## 2015-12-09 NOTE — Progress Notes (Signed)
ED Antimicrobial Stewardship Positive Culture Follow Up  Jacob Rosales is an 82 y.o. male who presented to [facility] on 12/06/2015 with a chief complaint of hematuria and was diagnosed with hemorrhagic cystitis.  Chief Complaint  Patient presents with  . Hematuria   ? Recent Results (from the past 720 hour(s))  Urine culture     Status: Abnormal   Collection Time: 12/06/15  5:37 AM  Result Value Ref Range Status   Specimen Description URINE, CLEAN CATCH  Final   Special Requests Normal  Final   Culture >=100,000 COLONIES/mL ENTEROCOCCUS SPECIES (A)  Final   Report Status 12/08/2015 FINAL  Final   Organism ID, Bacteria ENTEROCOCCUS SPECIES (A)  Final      Susceptibility   Enterococcus species - MIC*    AMPICILLIN <=2 SENSITIVE Sensitive     LEVOFLOXACIN 2 SENSITIVE Sensitive     NITROFURANTOIN <=16 SENSITIVE Sensitive     VANCOMYCIN 1 SENSITIVE Sensitive     * >=100,000 COLONIES/mL ENTEROCOCCUS SPECIES   ? Treated with cephalexin(Keflex), organism resistant to prescribed antimicrobial  New antibiotic prescription: Amoxicillin 500 mg BID for 7  days ? ED Provider: Shirlyn Goltz ? Duayne Cal 12/09/2015, 8:56 AM Infectious Diseases Pharmacist Phone# [phone]

## 2015-12-21 ENCOUNTER — Encounter: Payer: Self-pay | Admitting: Adult Health

## 2015-12-21 ENCOUNTER — Non-Acute Institutional Stay (SKILLED_NURSING_FACILITY): Payer: Medicare Other | Admitting: Adult Health

## 2015-12-21 DIAGNOSIS — G4700 Insomnia, unspecified: Secondary | ICD-10-CM | POA: Diagnosis not present

## 2015-12-21 DIAGNOSIS — R413 Other amnesia: Secondary | ICD-10-CM

## 2015-12-21 DIAGNOSIS — I872 Venous insufficiency (chronic) (peripheral): Secondary | ICD-10-CM

## 2015-12-21 DIAGNOSIS — Z7189 Other specified counseling: Secondary | ICD-10-CM

## 2015-12-21 DIAGNOSIS — I5023 Acute on chronic systolic (congestive) heart failure: Secondary | ICD-10-CM | POA: Diagnosis not present

## 2015-12-21 LAB — CBC AND DIFFERENTIAL
HEMATOCRIT: 42 % (ref 41–53)
HEMOGLOBIN: 13.7 g/dL (ref 13.5–17.5)
Platelets: 150 10*3/uL (ref 150–399)
WBC: 5.7 10^3/mL

## 2015-12-21 LAB — BASIC METABOLIC PANEL
BUN: 40 mg/dL — AB (ref 4–21)
Creatinine: 1.9 mg/dL — AB (ref 0.6–1.3)
Glucose: 94 mg/dL
Potassium: 4.4 mmol/L (ref 3.4–5.3)
Sodium: 144 mmol/L (ref 137–147)

## 2015-12-21 NOTE — Progress Notes (Signed)
Patient ID: Jacob Rosales, male   DOB: 1928-03-24, 82 y.o.   MRN: [id number]  Location:  [place]:  SNF (31) Provider:   Cindi Rosales, ANP [place] [phone]   Rosales, Jacob Sidle, DO  Patient Care Team: Jacob Curry, DO as PCP - General (Geriatric Medicine) Jacob Matin, MD as Consulting Physician (Dermatology) Jacob Lance, MD as Consulting Physician (Cardiology) [facility] Jacob Larsson, MD as Consulting Physician (Neurosurgery) Jacob Loser, MD as Consulting Physician (Urology)  Extended Emergency Contact Information Primary Emergency Contact: Rosales,Jacob Address: [address]          [place], [place] [address] [place] Phone: [phone] Relation: Spouse Secondary Emergency Contact: [place] Phone: [phone] Relation: Daughter  Code Status:  DNR Goals of care: Advanced Directive information Advanced Directives 12/06/2015  Does patient have an advance directive? Yes  Type of Advance Directive Out of facility DNR (pink MOST or yellow form);Healthcare Power of Attorney  Does patient want to make changes to advanced directive? No - Patient declined  Copy of advanced directive(s) in chart? Yes  Pre-existing out of facility DNR order (yellow form or pink MOST form) Yellow form placed in chart (order not valid for inpatient use);Pink MOST form placed in chart (order not valid for inpatient use)     Chief Complaint  Patient presents with  . Acute Visit    lethargy, edema, insomnia    HPI:  Pt is a 82 y.o. male seen today for an acute visit for reports of increased lethargy during the day and edema. The staff reports that he resident had increased edema in both feet. He denies any sob or cp. Sats are 97%.  He has a significant cardiac hx, EF in 2016 was 25%.  His weight has been trending upward over the past few months, currently at 153  lbs. This weight is stable over the past month but used to run 148 lbs to 150 lbs.  He also has some open area to both legs that are not draining or painful.  His family took off his ted hose because they said they were too tight, but they have been diligently elevating them.  Of note Jacob Rosales was sent to the ER on 4/5 for gross hematuria and treated for a UTI initially with keflex and then changed to amoxicillin due to resistance.  He has not had any further hematuria and remains on xarelto for afib.  He has had issues with insomnia and an altered sleep wake cycle, sleeping during the day and up most of the night. Multiple meds have been attempted. Currently he is on wellbutrin but his family is reporting further lethargy during the day, so much that he is sleeping through meals. He is on remeron which was started for sleep, as well depression/anxiety (typically eats well per the family until recently).      Past Medical History  Diagnosis Date  . Complete AV block ([place])   . Chronic systolic heart failure ([place])     a. Echo 4/16:  EF 25%, diff HK, Ao sclerosis without stenosis, mild AI, mild dilation of aortic root, mod MR, mod LAE, mildly reduced RVSF, PASP 52 mmHg  . Cardiomyopathy, ischemic     a. s/p STJ CRTD  . CAD (coronary artery disease)     s/p CABG  . Prostate cancer ([place])     S/P radiation rx  .  Osteoporosis     A/P Vertebral compression fx's  . Hypertension   . Dyslipidemia   . TIA (transient ischemic attack)        . Lumbago    . Cervicalgia    . Unspecified vitamin D deficiency    . Basal cell carcinoma of skin of lower limb, including hip    . Squamous cell carcinoma of skin of lower limb, including hip    . Impotence of organic origin    . Insomnia, unspecified    . Paroxysmal atrial fibrillation (HCC)      a. intol of coumadin - Rx with Aggrenox  . Fracture of C2 vertebra, closed ([place])     s/p cervical fusion 05/2013  . Memory loss 07/25/2014   Past Surgical  History  Procedure Laterality Date  . Coronary artery bypass graft    . Hernia repair    . Kyphosis surgery    . Biv-icd  11/21/2010    [employer] ICD Model#[id number] [phone]  . Cardiac defibrillator placement    . Cyst excision  10/2012    back Jacob Rosales  . Cyst removal neck  12/2012    basel cell (right) Jacob Rosales  . Colonoscopy  2006    polyps benign  . Dexa  April 2010  . Posterior cervical fusion/foraminotomy N/A 05/21/2013    Procedure: Cervical One, Cervical Two, Cervical Three Posterior cervical fusion with lateral mass fixation;  Surgeon: Jacob Pitter, MD;  Location: [place];  Service: Neurosurgery;  Laterality: N/A;  POSTERIOR CERVICAL FUSION/FORAMINOTOMY LEVEL 2  . Back surgery    . Coronary angioplasty      Allergies  Allergen Reactions  . Warfarin Sodium Other (See Comments)    REACTION: Stomach bleeding  . Amlodipine Besylate Swelling      Medication List       This list is accurate as of: 12/21/15  4:11 PM.  Always use your most recent med list.               bisacodyl 5 MG EC tablet  Commonly known as:  DULCOLAX  Take 5 mg by mouth daily as needed for mild constipation or moderate constipation.     buPROPion 75 MG tablet  Commonly known as:  WELLBUTRIN  Take 75 mg by mouth 2 (two) times daily.     calcium carbonate 750 MG chewable tablet  Commonly known as:  TUMS EX  Chew 2 tablets by mouth 2 (two) times daily.     carvedilol 6.25 MG tablet  Commonly known as:  COREG  Take 6.25 mg by mouth 2 (two) times daily with a meal.     cephALEXin 500 MG capsule  Commonly known as:  KEFLEX  Take 1 capsule (500 mg total) by mouth 2 (two) times daily.     cholecalciferol 1000 units tablet  Commonly known as:  VITAMIN D  Take 1,000 Units by mouth daily.     feeding supplement Liqd  Take 1 Container by mouth 3 (three) times daily between meals.     furosemide 40 MG tablet  Commonly known as:  LASIX  Take 40 mg by mouth daily.      GUAIFENESIN PO  Take 400 mg by mouth 2 (two) times daily.     hydrALAZINE 25 MG tablet  Commonly known as:  APRESOLINE  Take 25 mg by mouth daily.     isosorbide mononitrate 30 MG 24 hr tablet  Commonly known as:  IMDUR  Take 1 tablet (30 mg total) by mouth daily.     lisinopril 5 MG tablet  Commonly known as:  PRINIVIL,ZESTRIL  Take 1 tablet (5 mg total) by mouth daily.     memantine 28 MG Cp24 24 hr capsule  Commonly known as:  NAMENDA XR  Take 28 mg by mouth daily.     mirtazapine 15 MG tablet  Commonly known as:  REMERON  Take 15 mg by mouth at bedtime.     Rivaroxaban 15 MG Tabs tablet  Commonly known as:  XARELTO  Take 1 tablet (15 mg total) by mouth daily with supper.     senna-docusate 8.6-50 MG tablet  Commonly known as:  Senokot-S  Take 2 tablets by mouth at bedtime.     simvastatin 20 MG tablet  Commonly known as:  ZOCOR  TAKE 1 TABLET BY MOUTH AT BEDTIME FOR CHOLESTEROL     spironolactone 25 MG tablet  Commonly known as:  ALDACTONE  Take 0.5 tablets (12.5 mg total) by mouth daily.     TYLENOL 325 MG tablet  Generic drug:  acetaminophen  Take 325 mg by mouth every 6 (six) hours as needed for mild pain or moderate pain.        Review of Systems  Constitutional: Positive for activity change, appetite change and fatigue. Negative for fever, chills and diaphoresis.  HENT: Negative for congestion.   Respiratory: Negative for cough and shortness of breath.   Cardiovascular: Positive for leg swelling. Negative for chest pain and palpitations.  Gastrointestinal: Negative for nausea, vomiting, abdominal pain, diarrhea, constipation and abdominal distention.  Genitourinary: Negative for dysuria, frequency, hematuria, flank pain and difficulty urinating.  Musculoskeletal: Positive for gait problem. Negative for back pain and arthralgias.  Neurological: Negative for dizziness and facial asymmetry.  Psychiatric/Behavioral: Positive for confusion and sleep  disturbance. Negative for hallucinations, behavioral problems and agitation. The patient is not nervous/anxious.     Immunization History  Administered Date(s) Administered  . Influenza Whole 06/02/2012, 06/01/2013  . Influenza-Unspecified 06/18/2014, 06/22/2015  . Pneumococcal Conjugate-13 07/26/2015  . Pneumococcal Polysaccharide-23 09/03/2007  . Td 09/03/2007   Pertinent  Health Maintenance Due  Topic Date Due  . INFLUENZA VACCINE  04/02/2016  . PNA vac Low Risk Adult  Completed   Fall Risk  08/10/2015 10/11/2013  Falls in the past year? Yes No  Number falls in past yr: 1 -  Injury with Fall? No -  Risk for fall due to : History of fall(s) -  Follow up Falls evaluation completed -   Functional Status Survey:    Filed Vitals:   12/21/15 1601  BP: 128/78  Pulse: 70  Temp: 98.2 F (36.8 C)  Resp: 20  Weight: 153 lb (69.4 kg)  SpO2: 97%   Body mass index is 19.64 kg/(m^2).  Wt Readings from Last 3 Encounters:  12/21/15 153 lb (69.4 kg)  12/05/15 156 lb (70.761 kg)  11/21/15 153 lb (69.4 kg)    Physical Exam  Constitutional: No distress.  HENT:  Head: Normocephalic and atraumatic.  Eyes: Conjunctivae are normal. Pupils are equal, round, and reactive to light. Right eye exhibits no discharge. Left eye exhibits no discharge.  Neck: Normal range of motion. Neck supple. JVD present.  Cardiovascular: Normal rate.   No murmur heard. Irregular, BLE +3  Pulmonary/Chest: Effort normal and breath sounds normal.  Abdominal: Soft. Bowel sounds are normal. He exhibits no distension. There is no tenderness.  Musculoskeletal: He exhibits no edema or tenderness.  Neurological: No cranial nerve deficit.  Sleepy, able to f/c  Skin: Skin is warm and dry. He is not diaphoretic.  Multiple small open areas to BLE without drainage or erythema  Psychiatric:  Sleepy, difficult to arouse for me but responded to nsg supervisor    Labs reviewed:  Recent Labs  05/23/15 0117   06/07/15 1326  10/01/15 10/13/15 12/06/15 0437  NA 139  < > 137  < > 142 143 147*  K 3.5  < > 4.3  < > 4.7 4.7 4.9  CL 102  --  103  --   --   --  110  CO2 25  --  24  --   --   --  24  GLUCOSE 96  --  99  --   --   --  98  BUN 38*  < > 30*  < > 39* 35* 49*  CREATININE 1.89*  < > 1.38*  < > 1.7* 1.9* 1.99*  CALCIUM 8.8*  --  9.0  --   --   --  9.7  < > = values in this interval not displayed.  Recent Labs  12/29/14 03/10/15 1807 06/30/15  AST 27 48* 18  ALT 38 51 19  ALKPHOS 42 70 64  BILITOT  --  1.2  --   PROT  --  6.9  --   ALBUMIN  --  3.8  --     Recent Labs  03/10/15 1807  05/18/15 1616 05/21/15 0800 09/28/15 10/13/15 12/06/15 0437  WBC 8.7  < > 5.3 5.2 6.4 6.8 6.0  NEUTROABS 7.5  --  4.1  --   --   --  4.7  HGB 13.5  < > 13.3 11.7* 13.3* 13.3* 13.8  HCT 40.5  < > 41.0 36.8* 41 40* 43.5  MCV 95.5  < > 96.7 96.8  --   --  96.7  PLT 151  < > 125.0* 113* 165 150 112*  < > = values in this interval not displayed. Lab Results  Component Value Date   TSH 2.18 10/13/2015   Lab Results  Component Value Date   HGBA1C * 01/13/2010    5.7 (NOTE)                                                                       According to the ADA Clinical Practice Recommendations for 2011, when HbA1c is used as a screening test:   >=6.5%   Diagnostic of Diabetes Mellitus           (if abnormal result  is confirmed)  5.7-6.4%   Increased risk of developing Diabetes Mellitus  References:Diagnosis and Classification of Diabetes Mellitus,Diabetes [id number] 1):S62-S69 and Standards of Medical Care in         Diabetes - 2011,Diabetes [id number]  (Suppl 1):S11-S61.   Lab Results  Component Value Date   CHOL 96 06/30/2015   HDL 35 06/30/2015   LDLCALC 49 06/30/2015   TRIG 61 06/30/2015   CHOLHDL 2.3 01/13/2010    Significant Diagnostic Results in last 30 days:  No results found.  Assessment/Plan  1. Acute on chronic systolic CHF (congestive heart failure), NYHA class  3 (HCC) -Increase Lasix to 40 mg BID  for 3 days -continue to monitor weights -no K supplement at this time, last K 4.9 (on lasix 40 mg qd, aldactone, and lisinopril)  2. Insomnia -taper off wellbutrin due to increased lethargy per family -continue remeron -consider Belsomra once acute issue of CHF resolved and wellbutrin tapered off  3. Memory loss -most likely vascular in nature with progressive decline in cognitive status and function -remains on namenda  4. Venous insufficiency -worsening edema -apply clear dressing to any open areas of his legs, no sign of infection at this time -re apply compression hose when able (ill fitting right now) -keep legs elevated  5. Advanced care planning -Ms. Knierim and her daughter would prefer to keep Mr. Ingram in the facility for treatment of acute issues unless absolutely necessary. They are not ready to proceed with a no hospitalization order. He will continue to receive treatment as necessary for any medical issues including antibiotics and IVF. He is a DNR.    Family/ staff Communication: see above  Labs/tests ordered:  CBC, BMP  Jacob Rosales, [place] [phone]

## 2015-12-25 NOTE — Progress Notes (Addendum)
Last remote ICM transmission received 12/20/2015.   Received message from [name], NP on 12/21/2015 at [facility].  She reported patient's legs are edematous and weight is trending up, no resp distress.       ICM trend: 3 month view for 12/20/2015   ICM trend: 1 year view for 12/20/2015   Follow-up plan: ICM clinic phone appointment on 12/26/2015.  Thoracic impedance below reference line from 12/14/2015 to 12/20/2015 suggesting fluid accumulation which correlates with the symptoms reported by [name], NP at [facility].    Patient had symptoms of weight gain and lower extremity swelling.  Lasix was increased on 12/21/2015 to 40mg  bid x 3 days.    Will recheck fluid levels on 12/26/2015.   Ask patient to double up on lasix for 3 days (40 bid). GT  -  12/26/2015 Doubling Lasix has already been addressed by [name], NP on 12/21/2015 and new ICM transmission obtained 12/26/2015. LS     Copy of note sent to patient's primary care physician, primary cardiologist, and device following physician.  Rosalene Billings, RN, CCM 12/25/2015 8:40 AM

## 2015-12-26 ENCOUNTER — Ambulatory Visit (INDEPENDENT_AMBULATORY_CARE_PROVIDER_SITE_OTHER): Payer: Medicare Other

## 2015-12-26 ENCOUNTER — Telehealth: Payer: Self-pay

## 2015-12-26 DIAGNOSIS — Z95810 Presence of automatic (implantable) cardiac defibrillator: Secondary | ICD-10-CM | POA: Diagnosis not present

## 2015-12-26 DIAGNOSIS — I5022 Chronic systolic (congestive) heart failure: Secondary | ICD-10-CM

## 2015-12-26 NOTE — Progress Notes (Signed)
EPIC Encounter for ICM Monitoring  Patient Name: Jacob Rosales is a 82 y.o. male Date: 12/26/2015 Primary Care Physican: Hollace Kinnier, DO Primary Cardiologist: Lovena Le Electrophysiologist: Lovena Le Dry Weight: Per office note on 12/21/2015 - 153 lbs   Bi-V Pacing 97%      In the past month, have you:  1. Gained more than 2 pounds in a day or more than 5 pounds in a week? N/A  2. Had changes in your medications (with verification of current medications)? N/A  3. Had more shortness of breath than is usual for you? N/A  4. Limited your activity because of shortness of breath? N/A  5. Not been able to sleep because of shortness of breath? N/A  6. Had increased swelling in your feet or ankles? N/A  7. Had symptoms of dehydration (dizziness, dry mouth, increased thirst, decreased urine output) N/A  8. Had changes in sodium restriction? N/A  9. Been compliant with medication? N/A  ICM trend: 3 month view for 12/26/2015  ICM trend: 1 year view for 12/26/2015  Follow-up plan: ICM clinic phone appointment 01/30/2016.    FLUID LEVELS: [place] thoracic impedance decreased 12/14/2015 to 12/23/2015 suggesting fluid accumulation and returned to baseline 12/24/2015 which correlates with order to double Lasix on 12/21/2015.  Spoke with patients nurse Tammy  SYMPTOMS: Tammy, patients nurse at [facility], [phone] reported patient is doing well at this time.   Durward Fortes will send to Dr. Lovena Le and [name], NP for [facility]. Copy of note sent to PCP.  Rosalene Billings, RN, CCM 12/26/2015 9:33 AM

## 2015-12-26 NOTE — Telephone Encounter (Signed)
Call to [facility] to request ICM transmission that is scheduled today.  Received voice mail.  No message left.

## 2015-12-26 NOTE — Telephone Encounter (Signed)
Attempted call to [facility] and received voice mail.

## 2015-12-26 NOTE — Telephone Encounter (Signed)
Call to [facility] and spoke with Tammy. Requested to send ICM remote transmission and she stated she would do so in the next hour.

## 2016-01-01 ENCOUNTER — Non-Acute Institutional Stay (SKILLED_NURSING_FACILITY): Payer: Medicare Other | Admitting: Adult Health

## 2016-01-01 ENCOUNTER — Encounter: Payer: Self-pay | Admitting: Adult Health

## 2016-01-01 DIAGNOSIS — M810 Age-related osteoporosis without current pathological fracture: Secondary | ICD-10-CM | POA: Diagnosis not present

## 2016-01-01 DIAGNOSIS — M545 Low back pain, unspecified: Secondary | ICD-10-CM

## 2016-01-01 NOTE — Progress Notes (Signed)
Patient ID: Jacob Rosales, male   DOB: 23-Jul-1928, 82 y.o.   MRN: [id number]  Location:  [place]:  SNF (31) Provider:   Cindi Carbon, ANP [place] [phone]   REED, Jonelle Sidle, DO  Patient Care Team: Gayland Curry, DO as PCP - General (Geriatric Medicine) Jarome Matin, MD as Consulting Physician (Dermatology) Evans Lance, MD as Consulting Physician (Cardiology) [facility] Earnie Larsson, MD as Consulting Physician (Neurosurgery) Bjorn Loser, MD as Consulting Physician (Urology)  Extended Emergency Contact Information Primary Emergency Contact: Privette,[name] Address: [address]          [place], [place] [address] [place] Phone: [phone] Relation: Spouse Secondary Emergency Contact: [place] Phone: [phone] Relation: Daughter  Code Status:  DNR Goals of care: Advanced Directive information Advanced Directives 12/06/2015  Does patient have an advance directive? Yes  Type of Advance Directive Out of facility DNR (pink MOST or yellow form);Healthcare Power of Attorney  Does patient want to make changes to advanced directive? No - Patient declined  Copy of advanced directive(s) in chart? Yes  Pre-existing out of facility DNR order (yellow form or pink MOST form) Yellow form placed in chart (order not valid for inpatient use);Pink MOST form placed in chart (order not valid for inpatient use)     Chief Complaint  Patient presents with  . Acute Visit    low back pain    HPI:  Pt is a 82 y.o. male seen today for back pain. He reported back pain on 4/30.  He fell later in the night from his bed but the pain started before then. He has dementia and is a poor historian but has consistently reported mid to low back pain. He has had issues with finding a chair that will fit him as he tends to slide down and appear uncomfortable. He denies any  dysuria, and the staff has not noted any bowel or bladder issues. The pain does not radiate.  He has a hx of OP see below  Fosamax 1992- prior to 2010. History of vertebal fracture L/S, thoracic spine. S/p thoracic kyphoplasty 2009. DXA scan 2010 ([place]): T-score -1.7(spine), -2.1 (hip). Represents decreased BMD from prior exam in 2008  He had a thoracic, lumbar and sacral xray today  Thoracic: not acute fracture, moderate spondylotic changes, and multilevel thoracic compression deformities with up to 40% loss of vertebral height of unknown chronicity Lumbar: no acute fracture, moderate spondylotic changes. L1 and L3 compression deformities with loss of up to 60% loss of height   Past Medical History  Diagnosis Date  . Complete AV block ([place])   . Chronic systolic heart failure ([place])     a. Echo 4/16:  EF 25%, diff HK, Ao sclerosis without stenosis, mild AI, mild dilation of aortic root, mod MR, mod LAE, mildly reduced RVSF, PASP 52 mmHg  . Cardiomyopathy, ischemic     a. s/p STJ CRTD  . CAD (coronary artery disease)     s/p CABG  . Prostate cancer ([place])     S/P radiation rx  . Osteoporosis     A/P Vertebral compression fx's  . Hypertension   . Dyslipidemia   . TIA (transient ischemic attack)        . Lumbago    . Cervicalgia    . Unspecified vitamin D deficiency    . Basal cell carcinoma of skin of  lower limb, including hip    . Squamous cell carcinoma of skin of lower limb, including hip    . Impotence of organic origin    . Insomnia, unspecified    . Paroxysmal atrial fibrillation (HCC)      a. intol of coumadin - Rx with Aggrenox  . Fracture of C2 vertebra, closed ([place])     s/p cervical fusion 05/2013  . Memory loss 07/25/2014   Past Surgical History  Procedure Laterality Date  . Coronary artery bypass graft    . Hernia repair    . Kyphosis surgery    . Biv-icd  11/21/2010    [employer] ICD Model#[id number]  . Cardiac defibrillator placement     . Cyst excision  10/2012    back Dr. Wilhemina Bonito  . Cyst removal neck  12/2012    basel cell (right) Dr. Sarajane Jews  . Colonoscopy  2006    polyps benign  . Dexa  April 2010  . Posterior cervical fusion/foraminotomy N/A 05/21/2013    Procedure: Cervical One, Cervical Two, Cervical Three Posterior cervical fusion with lateral mass fixation;  Surgeon: Charlie Pitter, MD;  Location: [facility];  Service: Neurosurgery;  Laterality: N/A;  POSTERIOR CERVICAL FUSION/FORAMINOTOMY LEVEL 2  . Back surgery    . Coronary angioplasty      Allergies  Allergen Reactions  . Warfarin Sodium Other (See Comments)    REACTION: Stomach bleeding  . Amlodipine Besylate Swelling      Medication List       This list is accurate as of: 01/01/16  4:14 PM.  Always use your most recent med list.               bisacodyl 5 MG EC tablet  Commonly known as:  DULCOLAX  Take 5 mg by mouth daily as needed for mild constipation or moderate constipation.     buPROPion 75 MG tablet  Commonly known as:  WELLBUTRIN  Take 75 mg by mouth 2 (two) times daily.     calcium carbonate 750 MG chewable tablet  Commonly known as:  TUMS EX  Chew 2 tablets by mouth 2 (two) times daily.     carvedilol 6.25 MG tablet  Commonly known as:  COREG  Take 6.25 mg by mouth 2 (two) times daily with a meal.     cephALEXin 500 MG capsule  Commonly known as:  KEFLEX  Take 1 capsule (500 mg total) by mouth 2 (two) times daily.     cholecalciferol 1000 units tablet  Commonly known as:  VITAMIN D  Take 1,000 Units by mouth daily.     feeding supplement Liqd  Take 1 Container by mouth 3 (three) times daily between meals.     furosemide 40 MG tablet  Commonly known as:  LASIX  Take 40 mg by mouth daily.     GUAIFENESIN PO  Take 400 mg by mouth 2 (two) times daily.     hydrALAZINE 25 MG tablet  Commonly known as:  APRESOLINE  Take 25 mg by mouth daily.     isosorbide mononitrate 30 MG 24 hr tablet  Commonly known as:  IMDUR  Take  1 tablet (30 mg total) by mouth daily.     lisinopril 5 MG tablet  Commonly known as:  PRINIVIL,ZESTRIL  Take 1 tablet (5 mg total) by mouth daily.     memantine 28 MG Cp24 24 hr capsule  Commonly known as:  NAMENDA XR  Take 28  mg by mouth daily.     mirtazapine 15 MG tablet  Commonly known as:  REMERON  Take 15 mg by mouth at bedtime.     Rivaroxaban 15 MG Tabs tablet  Commonly known as:  XARELTO  Take 1 tablet (15 mg total) by mouth daily with supper.     senna-docusate 8.6-50 MG tablet  Commonly known as:  Senokot-S  Take 2 tablets by mouth at bedtime.     simvastatin 20 MG tablet  Commonly known as:  ZOCOR  TAKE 1 TABLET BY MOUTH AT BEDTIME FOR CHOLESTEROL     spironolactone 25 MG tablet  Commonly known as:  ALDACTONE  Take 0.5 tablets (12.5 mg total) by mouth daily.     TYLENOL 325 MG tablet  Generic drug:  acetaminophen  Take 325 mg by mouth every 6 (six) hours as needed for mild pain or moderate pain.        Review of Systems  Constitutional: Positive for activity change. Negative for fever, chills, diaphoresis, appetite change and fatigue.  HENT: Negative for congestion.   Respiratory: Negative for shortness of breath and wheezing.   Cardiovascular: Positive for leg swelling. Negative for chest pain.  Gastrointestinal: Negative for nausea, vomiting, diarrhea, constipation and abdominal distention.  Genitourinary: Negative for dysuria and difficulty urinating.  Musculoskeletal: Positive for back pain, arthralgias and gait problem.  Psychiatric/Behavioral: Positive for confusion and agitation. Negative for behavioral problems.    Immunization History  Administered Date(s) Administered  . Influenza Whole 06/02/2012, 06/01/2013  . Influenza-Unspecified 06/18/2014, 06/22/2015  . Pneumococcal Conjugate-13 07/26/2015  . Pneumococcal Polysaccharide-23 09/03/2007  . Td 09/03/2007   Pertinent  Health Maintenance Due  Topic Date Due  . INFLUENZA VACCINE   04/02/2016  . PNA vac Low Risk Adult  Completed   Fall Risk  08/10/2015 10/11/2013  Falls in the past year? Yes No  Number falls in past yr: 1 -  Injury with Fall? No -  Risk for fall due to : History of fall(s) -  Follow up Falls evaluation completed -   Functional Status Survey:    There were no vitals filed for this visit. There is no weight on file to calculate BMI. Physical Exam  Constitutional: No distress.  HENT:  Head: Normocephalic and atraumatic.  Eyes: Pupils are equal, round, and reactive to light.  Neck: No JVD present. No thyromegaly present.  Cardiovascular: Normal rate.   Murmur heard. Irregular, trace BLE edema  Pulmonary/Chest: Effort normal and breath sounds normal.  Abdominal: Soft. Bowel sounds are normal.  Musculoskeletal: He exhibits no edema or tenderness.  Hips, knees, shoulders, and elbows without deformity or pain on ROM.  No spinous process tenderness.  Reports pain with SLR but not radicular  Neurological: He is alert. No cranial nerve deficit.  Oriented to self and situation.  Groggy, did not sleep last night.  Skin: Skin is warm and dry. He is not diaphoretic.  Psychiatric: He has a normal mood and affect.    Labs reviewed:  Recent Labs  05/23/15 0117  06/07/15 1326  10/01/15 10/13/15 12/06/15 0437  NA 139  < > 137  < > 142 143 147*  K 3.5  < > 4.3  < > 4.7 4.7 4.9  CL 102  --  103  --   --   --  110  CO2 25  --  24  --   --   --  24  GLUCOSE 96  --  99  --   --   --  98  BUN 38*  < > 30*  < > 39* 35* 49*  CREATININE 1.89*  < > 1.38*  < > 1.7* 1.9* 1.99*  CALCIUM 8.8*  --  9.0  --   --   --  9.7  < > = values in this interval not displayed.  Recent Labs  03/10/15 1807 06/30/15  AST 48* 18  ALT 51 19  ALKPHOS 70 64  BILITOT 1.2  --   PROT 6.9  --   ALBUMIN 3.8  --     Recent Labs  03/10/15 1807  05/18/15 1616 05/21/15 0800 09/28/15 10/13/15 12/06/15 0437  WBC 8.7  < > 5.3 5.2 6.4 6.8 6.0  NEUTROABS 7.5  --  4.1  --   --    --  4.7  HGB 13.5  < > 13.3 11.7* 13.3* 13.3* 13.8  HCT 40.5  < > 41.0 36.8* 41 40* 43.5  MCV 95.5  < > 96.7 96.8  --   --  96.7  PLT 151  < > 125.0* 113* 165 150 112*  < > = values in this interval not displayed. Lab Results  Component Value Date   TSH 2.18 10/13/2015   Lab Results  Component Value Date   HGBA1C * 01/13/2010    5.7 (NOTE)                                                                       According to the ADA Clinical Practice Recommendations for 2011, when HbA1c is used as a screening test:   >=6.5%   Diagnostic of Diabetes Mellitus           (if abnormal result  is confirmed)  5.7-6.4%   Increased risk of developing Diabetes Mellitus  References:Diagnosis and Classification of Diabetes Mellitus,Diabetes [id number] 1):S62-S69 and Standards of Medical Care in         Diabetes - 2011,Diabetes [id number]  (Suppl 1):S11-S61.   Lab Results  Component Value Date   CHOL 96 06/30/2015   HDL 35 06/30/2015   LDLCALC 49 06/30/2015   TRIG 61 06/30/2015   CHOLHDL 2.3 01/13/2010    Significant Diagnostic Results in last 30 days:  No results found.  Assessment/Plan 1. Bilateral low back pain without sciatica -Multifactorial, due to chair positioning and compression deformities noted on xray -no radicular pain, appear comfortable now -may use ultram 50 mg q 8 hrs pain prn -heat prn  2. Osteoporosis -has used fosamax in the past, no longer ambulatory and would not benefit from dexa scan or RX -currently on TUMS BID because he had trouble swallowing other forms of calcium -increase Vit D 2000 units qd    Family/ staff Communication: discussed with staff  Labs/tests ordered:  Follow up xray in 4 weeks

## 2016-01-13 ENCOUNTER — Emergency Department (HOSPITAL_COMMUNITY)
Admission: EM | Admit: 2016-01-13 | Discharge: 2016-01-14 | Disposition: A | Discharge status: Home / Self Care | Payer: Medicare Other | Attending: Emergency Medicine | Admitting: Emergency Medicine

## 2016-01-13 ENCOUNTER — Emergency Department (HOSPITAL_COMMUNITY): Payer: Medicare Other

## 2016-01-13 ENCOUNTER — Encounter (HOSPITAL_COMMUNITY): Payer: Self-pay

## 2016-01-13 DIAGNOSIS — W19XXXA Unspecified fall, initial encounter: Secondary | ICD-10-CM

## 2016-01-13 DIAGNOSIS — Y9389 Activity, other specified: Secondary | ICD-10-CM | POA: Diagnosis not present

## 2016-01-13 DIAGNOSIS — Z7901 Long term (current) use of anticoagulants: Secondary | ICD-10-CM | POA: Diagnosis not present

## 2016-01-13 DIAGNOSIS — I10 Essential (primary) hypertension: Secondary | ICD-10-CM | POA: Insufficient documentation

## 2016-01-13 DIAGNOSIS — Y998 Other external cause status: Secondary | ICD-10-CM | POA: Diagnosis not present

## 2016-01-13 DIAGNOSIS — I2510 Atherosclerotic heart disease of native coronary artery without angina pectoris: Secondary | ICD-10-CM | POA: Insufficient documentation

## 2016-01-13 DIAGNOSIS — E559 Vitamin D deficiency, unspecified: Secondary | ICD-10-CM | POA: Insufficient documentation

## 2016-01-13 DIAGNOSIS — F0390 Unspecified dementia without behavioral disturbance: Secondary | ICD-10-CM | POA: Insufficient documentation

## 2016-01-13 DIAGNOSIS — E785 Hyperlipidemia, unspecified: Secondary | ICD-10-CM | POA: Insufficient documentation

## 2016-01-13 DIAGNOSIS — Z8673 Personal history of transient ischemic attack (TIA), and cerebral infarction without residual deficits: Secondary | ICD-10-CM | POA: Diagnosis not present

## 2016-01-13 DIAGNOSIS — Z043 Encounter for examination and observation following other accident: Secondary | ICD-10-CM | POA: Diagnosis present

## 2016-01-13 DIAGNOSIS — Y9289 Other specified places as the place of occurrence of the external cause: Secondary | ICD-10-CM | POA: Insufficient documentation

## 2016-01-13 DIAGNOSIS — Z8669 Personal history of other diseases of the nervous system and sense organs: Secondary | ICD-10-CM | POA: Insufficient documentation

## 2016-01-13 DIAGNOSIS — Z87448 Personal history of other diseases of urinary system: Secondary | ICD-10-CM | POA: Insufficient documentation

## 2016-01-13 DIAGNOSIS — Z791 Long term (current) use of non-steroidal anti-inflammatories (NSAID): Secondary | ICD-10-CM | POA: Diagnosis not present

## 2016-01-13 DIAGNOSIS — M810 Age-related osteoporosis without current pathological fracture: Secondary | ICD-10-CM | POA: Diagnosis not present

## 2016-01-13 DIAGNOSIS — Z8546 Personal history of malignant neoplasm of prostate: Secondary | ICD-10-CM | POA: Insufficient documentation

## 2016-01-13 DIAGNOSIS — I5022 Chronic systolic (congestive) heart failure: Secondary | ICD-10-CM | POA: Insufficient documentation

## 2016-01-13 DIAGNOSIS — W1839XA Other fall on same level, initial encounter: Secondary | ICD-10-CM | POA: Diagnosis not present

## 2016-01-13 DIAGNOSIS — Z79899 Other long term (current) drug therapy: Secondary | ICD-10-CM | POA: Insufficient documentation

## 2016-01-13 NOTE — ED Provider Notes (Signed)
CSN: [id number]     Arrival date & time 01/13/16  1742 History   First MD Initiated Contact with Patient 01/13/16 1743     Chief Complaint  Patient presents with  . Fall     (Consider location/radiation/quality/duration/timing/severity/associated sxs/prior Treatment) HPI Elderly male with dementia presents from his nursing facility after a possible fall. Per report the patient was found on his hands and knees in his room after an unwitnessed episode. Patient is demented, answers almost every question and the negative. He does deny pain, does acknowledge cough, otherwise does not provide useful details of the history of present illness. Level V caveat.  Past Medical History  Diagnosis Date  . Complete AV block ([place])   . Chronic systolic heart failure ([place])     a. Echo 4/16:  EF 25%, diff HK, Ao sclerosis without stenosis, mild AI, mild dilation of aortic root, mod MR, mod LAE, mildly reduced RVSF, PASP 52 mmHg  . Cardiomyopathy, ischemic     a. s/p STJ CRTD  . CAD (coronary artery disease)     s/p CABG  . Prostate cancer ([place])     S/P radiation rx  . Osteoporosis     A/P Vertebral compression fx's  . Hypertension   . Dyslipidemia   . TIA (transient ischemic attack)        . Lumbago    . Cervicalgia    . Unspecified vitamin D deficiency    . Basal cell carcinoma of skin of lower limb, including hip    . Squamous cell carcinoma of skin of lower limb, including hip    . Impotence of organic origin    . Insomnia, unspecified    . Paroxysmal atrial fibrillation (HCC)      a. intol of coumadin - Rx with Aggrenox  . Fracture of C2 vertebra, closed ([place])     s/p cervical fusion 05/2013  . Memory loss 07/25/2014   Past Surgical History  Procedure Laterality Date  . Coronary artery bypass graft    . Hernia repair    . Kyphosis surgery    . Biv-icd  11/21/2010    [employer] ICD Model#[id number]  . Cardiac defibrillator placement    . Cyst excision  10/2012     back Dr. Wilhemina Bonito  . Cyst removal neck  12/2012    basel cell (right) Dr. Sarajane Jews  . Colonoscopy  2006    polyps benign  . Dexa  April 2010  . Posterior cervical fusion/foraminotomy N/A 05/21/2013    Procedure: Cervical One, Cervical Two, Cervical Three Posterior cervical fusion with lateral mass fixation;  Surgeon: Charlie Pitter, MD;  Location: [facility];  Service: Neurosurgery;  Laterality: N/A;  POSTERIOR CERVICAL FUSION/FORAMINOTOMY LEVEL 2  . Back surgery    . Coronary angioplasty     Family History  Problem Relation Age of Onset  . Coronary artery disease Neg Hx   . Heart disease Mother    Social History  Substance Use Topics  . Smoking status: Never Smoker   . Smokeless tobacco: Never Used  . Alcohol Use: 1.2 oz/week    1 Glasses of wine, 1 Cans of beer per week    Review of Systems  Unable to perform ROS: Dementia      Allergies  Warfarin sodium and Amlodipine besylate  Home Medications   Prior to Admission medications   Medication Sig Start Date End Date Taking? Authorizing Provider  acetaminophen (TYLENOL) 325 MG tablet Take 325 mg  by mouth every 6 (six) hours as needed for mild pain or moderate pain.    Historical Provider, MD  bisacodyl (DULCOLAX) 5 MG EC tablet Take 5 mg by mouth daily as needed for mild constipation or moderate constipation.    Historical Provider, MD  buPROPion (WELLBUTRIN) 75 MG tablet Take 75 mg by mouth 2 (two) times daily.    Historical Provider, MD  calcium carbonate (TUMS EX) 750 MG chewable tablet Chew 2 tablets by mouth 2 (two) times daily.    Historical Provider, MD  carvedilol (COREG) 6.25 MG tablet Take 6.25 mg by mouth 2 (two) times daily with a meal.    Historical Provider, MD  cephALEXin (KEFLEX) 500 MG capsule Take 1 capsule (500 mg total) by mouth 2 (two) times daily. 12/06/15   Virgel Manifold, MD  cholecalciferol (VITAMIN D) 1000 UNITS tablet Take 1,000 Units by mouth daily.     Historical Provider, MD  feeding supplement (BOOST  / RESOURCE BREEZE) LIQD Take 1 Container by mouth 3 (three) times daily between meals.    Historical Provider, MD  furosemide (LASIX) 40 MG tablet Take 40 mg by mouth daily.  06/23/15   Historical Provider, MD  GUAIFENESIN PO Take 400 mg by mouth 2 (two) times daily.    Historical Provider, MD  hydrALAZINE (APRESOLINE) 25 MG tablet Take 25 mg by mouth daily.    Historical Provider, MD  isosorbide mononitrate (IMDUR) 30 MG 24 hr tablet Take 1 tablet (30 mg total) by mouth daily. 07/26/15   Evans Lance, MD  lisinopril (PRINIVIL,ZESTRIL) 5 MG tablet Take 1 tablet (5 mg total) by mouth daily. 10/18/15   Liliane Shi, PA-C  memantine (NAMENDA XR) 28 MG CP24 24 hr capsule Take 28 mg by mouth daily.    Historical Provider, MD  mirtazapine (REMERON) 15 MG tablet Take 15 mg by mouth at bedtime.    Historical Provider, MD  Rivaroxaban (XARELTO) 15 MG TABS tablet Take 1 tablet (15 mg total) by mouth daily with supper. 09/12/15   Tiffany L Reed, DO  senna-docusate (SENOKOT-S) 8.6-50 MG per tablet Take 2 tablets by mouth at bedtime.  05/24/13   Mardene Celeste, NP  simvastatin (ZOCOR) 20 MG tablet TAKE 1 TABLET BY MOUTH AT BEDTIME FOR CHOLESTEROL 12/01/14   Tiffany L Reed, DO  spironolactone (ALDACTONE) 25 MG tablet Take 0.5 tablets (12.5 mg total) by mouth daily. 05/18/15   Eileen Stanford, PA-C   SpO2 96% Physical Exam  Constitutional: He has a sickly appearance. No distress.  HENT:  Head: Normocephalic and atraumatic.  Eyes: Conjunctivae and EOM are normal.  Neck:  Neck is supple, no obvious deformity, the patient does not rotate substantially in either direction.   Cardiovascular: Normal rate and regular rhythm.   Pulmonary/Chest: Effort normal. No stridor. No respiratory distress.  Abdominal: He exhibits no distension.  Musculoskeletal: He exhibits no edema.  Neurological: He is alert. He displays atrophy. He displays no tremor. No cranial nerve deficit. He displays no seizure activity.   Moves all extremity spontaneously, is oriented, speech is brief, clear, essentially minimal  Skin: Skin is warm and dry.  Psychiatric: He is slowed and withdrawn. Cognition and memory are impaired.  Nursing note and vitals reviewed.   ED Course  Procedures (including critical care time) I reviewed the EMS notes, nursing home transfer note following the episode of possible fall.  I reviewed the imaging studies, agree with the interpretation.  On repeat exam the patient is in  no distress.  MDM  Elderly male with dementia presents from his nursing facility after a possible fall. Given the patient's age, dementia, use of blood thinning products, CAT scan, x-ray was performed. Studies were reassuring, with no evidence for new intracranial hemorrhage. Patient remained hemodynamically stable in no distress for monitoring in the emergency department. Patient discharged in stable condition.    Carmin Muskrat, MD 01/13/16 [phone]

## 2016-01-13 NOTE — ED Notes (Signed)
Per EMS, pt from [facility], pt was found in his room on his hands and knees by staff. Unknown if pt fell or hit head, no loc. Pt has no obvious deformities. Pt is on xarelto. Pt has dementia and staff states that pt is at baseline. Pt does not answer all questions appropriately at his baseline. CBG was normal and VSS. Pt oriented to self and place upon arrival.

## 2016-01-13 NOTE — Discharge Instructions (Signed)
As discussed, your evaluation today has been largely reassuring.  But, it is important that you monitor your condition carefully, and do not hesitate to return to the ED if you develop new, or concerning changes in your condition. ? ?Otherwise, please follow-up with your physician for appropriate ongoing care. ? ?

## 2016-01-13 NOTE — ED Notes (Signed)
Pt's caregiver verbalized understanding of d/c instructions and has no further questions. Pt stable and NAD. Pt to be d/c home with ptar.

## 2016-01-13 NOTE — ED Notes (Signed)
Pt's family member in hall, upset about delay in transport for patient to the facility.  Expressed concern that patient has not eaten or been changed.  This RN went in room to change patient.  Patient's diaper was very wet.  Peri care done and dry brief applied.  Patient is on a thick/pureed diet, so apple sauce provided.  Pt had two cups of sugar free apple sauce.  Family reassured, and delay in transport explained.

## 2016-01-15 ENCOUNTER — Other Ambulatory Visit: Payer: Self-pay | Admitting: *Deleted

## 2016-01-15 ENCOUNTER — Telehealth: Payer: Self-pay | Admitting: Adult Health

## 2016-01-15 DIAGNOSIS — I5022 Chronic systolic (congestive) heart failure: Secondary | ICD-10-CM

## 2016-01-15 MED ORDER — MIRTAZAPINE 15 MG PO TABS: 15.0000 mg | ORAL_TABLET | Freq: Every day | ORAL | Status: AC

## 2016-01-15 MED ORDER — LISINOPRIL 5 MG PO TABS: 5.0000 mg | ORAL_TABLET | Freq: Every day | ORAL | Status: DC

## 2016-01-15 NOTE — Telephone Encounter (Signed)
Keflex removed from med list as he is not on it and does not have s/s of infection

## 2016-01-15 NOTE — Telephone Encounter (Signed)
[facility]

## 2016-01-19 ENCOUNTER — Telehealth: Payer: Self-pay | Admitting: *Deleted

## 2016-01-19 NOTE — Telephone Encounter (Signed)
Patient wife called and stated that patient is in [place] and his Rx's need to be faxed to [facility]. Explained to her that the Rx's for Skilled residents would go through [facility] and they would send for his refills and for her to discuss this with the nurses at [facility]. She agreed.

## 2016-01-30 ENCOUNTER — Ambulatory Visit (INDEPENDENT_AMBULATORY_CARE_PROVIDER_SITE_OTHER): Payer: Medicare Other | Admitting: *Deleted

## 2016-01-30 DIAGNOSIS — I255 Ischemic cardiomyopathy: Secondary | ICD-10-CM | POA: Diagnosis not present

## 2016-01-30 DIAGNOSIS — Z95810 Presence of automatic (implantable) cardiac defibrillator: Secondary | ICD-10-CM

## 2016-01-30 DIAGNOSIS — I5022 Chronic systolic (congestive) heart failure: Secondary | ICD-10-CM | POA: Diagnosis not present

## 2016-01-30 NOTE — Progress Notes (Signed)
EPIC Encounter for ICM Monitoring  Patient Name: Jacob Rosales is a 82 y.o. male Date: 01/30/2016 Primary Care Physican: Hollace Kinnier, DO Primary Cardiologist: Lovena Le Electrophysiologist: Lovena Le Dry Weight: unknown  Bi-V Pacing 97%      In the past month, have you:  1. Gained more than 2 pounds in a day or more than 5 pounds in a week? no  2. Had changes in your medications (with verification of current medications)? no  3. Had more shortness of breath than is usual for you? no  4. Limited your activity because of shortness of breath? no  5. Not been able to sleep because of shortness of breath? no  6. Had increased swelling in your feet, ankles, legs or stomach area? no  7. Had symptoms of dehydration (dizziness, dry mouth, increased thirst, decreased urine output) no  8. Had changes in sodium restriction? no  9. Been compliant with medication? Yes  ICM trend: 3 month view for 01/30/2016  ICM trend: 1 year view for 01/30/2016   Follow-up plan: ICM clinic phone appointment 03/01/2016.  Spoke to wife.  FLUID LEVELS: [place]vue thoracic impedance above baseline 01/13/2016 to 01/27/2016 suggesting dryness.    SYMPTOMS:  Wife stated patient continues to be inpatient at [facility] and doing well at this time. Marland Kitchen   RECOMMENDATIONS: No changes today.    Advised will send to PCP for review.    Rosalene Billings, RN, CCM 01/30/2016 10:48 AM

## 2016-01-31 NOTE — Progress Notes (Signed)
Remote ICD transmission.   

## 2016-02-05 ENCOUNTER — Non-Acute Institutional Stay (SKILLED_NURSING_FACILITY): Payer: Medicare Other | Admitting: Adult Health

## 2016-02-05 DIAGNOSIS — F0151 Vascular dementia with behavioral disturbance: Secondary | ICD-10-CM

## 2016-02-05 DIAGNOSIS — R634 Abnormal weight loss: Secondary | ICD-10-CM

## 2016-02-05 DIAGNOSIS — G8929 Other chronic pain: Secondary | ICD-10-CM | POA: Diagnosis not present

## 2016-02-05 DIAGNOSIS — G4700 Insomnia, unspecified: Secondary | ICD-10-CM | POA: Diagnosis not present

## 2016-02-05 DIAGNOSIS — I5022 Chronic systolic (congestive) heart failure: Secondary | ICD-10-CM

## 2016-02-05 DIAGNOSIS — I10 Essential (primary) hypertension: Secondary | ICD-10-CM | POA: Diagnosis not present

## 2016-02-05 DIAGNOSIS — M545 Low back pain: Secondary | ICD-10-CM | POA: Diagnosis not present

## 2016-02-05 DIAGNOSIS — F01518 Vascular dementia, unspecified severity, with other behavioral disturbance: Secondary | ICD-10-CM | POA: Insufficient documentation

## 2016-02-05 NOTE — Progress Notes (Signed)
Patient ID: Jacob Rosales, male   DOB: 1927/10/02, 82 y.o.   MRN: [id number]   Location:  [place]:  SNF (31) Provider:   Cindi Carbon, ANP [place] [phone]   REED, Jonelle Sidle, DO  Patient Care Team: Gayland Curry, DO as PCP - General (Geriatric Medicine) Jarome Matin, MD as Consulting Physician (Dermatology) Evans Lance, MD as Consulting Physician (Cardiology) [facility] Earnie Larsson, MD as Consulting Physician (Neurosurgery) Bjorn Loser, MD as Consulting Physician (Urology)  Extended Emergency Contact Information Primary Emergency Contact: Labella,[name] Address: [address]          [place], [place] [address] [place] Phone: [phone] Relation: Spouse Secondary Emergency Contact: [place] Phone: [phone] Relation: Daughter  Code Status:  DNR Goals of care: Advanced Directive information Advanced Directives 01/13/2016  Does patient have an advance directive? Yes  Type of Advance Directive Out of facility DNR (pink MOST or yellow form)  Does patient want to make changes to advanced directive? No - Patient declined  Copy of advanced directive(s) in chart? Yes  Would patient like information on creating an advanced directive? No - patient declined information  Pre-existing out of facility DNR order (yellow form or pink MOST form) Yellow form placed in chart (order not valid for inpatient use)     Chief Complaint  Patient presents with  . Medical Management of Chronic Issues    HPI:  Pt is a 82 y.o. male seen today for medical management of chronic diseases.  Resides in skilled care due to vascular dementia and immobility.  Weights have trended down ward to 142 lbs, 10+ lb loss since April.  Appears to be taking in less at meals.   Functional status: hoyer lift to broda chair  1. Vascular dementia with behavior  disturbance MMSE 17/30 [facility] 11/30 Sept of 2016 On namenda Has functional decline, requires more assistance  2. Essential hypertension BP range 103-132/64-69, no edema  3. Chronic systolic heart failure (HCC) Weight trending down On lasix and aldactone EF 25% in April of 2016, has implanted impedence monitoring indicating dryness at last check  4. Insomnia Sleeping well with belsomra nightly  5. Chronic midline low back pain without sciatica Improved pain with new broda chair, has not needed ultram routinely Has a hx of OP with compression fracture Lspine xray 01/01/16 showed L1 and L3 height loss of up to 60% AND MULTILEVEL COMPRESSION DEFORMITIES IN THE THORACIC AREA, FOLLOW UP XRAY WITH NO ACUTE CHANGES OR PROGRESSION     Past Medical History  Diagnosis Date  . Complete AV block ([place])   . Chronic systolic heart failure ([place])     a. Echo 4/16:  EF 25%, diff HK, Ao sclerosis without stenosis, mild AI, mild dilation of aortic root, mod MR, mod LAE, mildly reduced RVSF, PASP 52 mmHg  . Cardiomyopathy, ischemic     a. s/p STJ CRTD  . CAD (coronary artery disease)     s/p CABG  . Prostate cancer ([place])     S/P radiation rx  . Osteoporosis     A/P Vertebral compression fx's  . Hypertension   . Dyslipidemia   . TIA (transient ischemic attack)        . Lumbago    . Cervicalgia    . Unspecified vitamin D deficiency    . Basal cell carcinoma of skin of lower limb, including hip    .  Squamous cell carcinoma of skin of lower limb, including hip    . Impotence of organic origin    . Insomnia, unspecified    . Paroxysmal atrial fibrillation (HCC)      a. intol of coumadin - Rx with Aggrenox  . Fracture of C2 vertebra, closed ([place])     s/p cervical fusion 05/2013  . Memory loss 07/25/2014   Past Surgical History  Procedure Laterality Date  . Coronary artery bypass graft    . Hernia repair    . Kyphosis surgery    . Biv-icd  11/21/2010    [employer] ICD Model#[id number]  [id number]  . Cardiac defibrillator placement    . Cyst excision  10/2012    back Dr. Wilhemina Bonito  . Cyst removal neck  12/2012    basel cell (right) Dr. Sarajane Jews  . Colonoscopy  2006    polyps benign  . Dexa  April 2010  . Posterior cervical fusion/foraminotomy N/A 05/21/2013    Procedure: Cervical One, Cervical Two, Cervical Three Posterior cervical fusion with lateral mass fixation;  Surgeon: Charlie Pitter, MD;  Location: [place];  Service: Neurosurgery;  Laterality: N/A;  POSTERIOR CERVICAL FUSION/FORAMINOTOMY LEVEL 2  . Back surgery    . Coronary angioplasty      Allergies  Allergen Reactions  . Warfarin Sodium Other (See Comments)    REACTION: Stomach bleeding  . Amlodipine Besylate Swelling      Medication List       This list is accurate as of: 02/05/16 10:59 AM.  Always use your most recent med list.               bisacodyl 5 MG EC tablet  Commonly known as:  DULCOLAX  Take 5 mg by mouth daily as needed for mild constipation or moderate constipation.     buPROPion 75 MG tablet  Commonly known as:  WELLBUTRIN  Take 75 mg by mouth 2 (two) times daily.     calcium carbonate 750 MG chewable tablet  Commonly known as:  TUMS EX  Chew 2 tablets by mouth 2 (two) times daily.     carvedilol 6.25 MG tablet  Commonly known as:  COREG  Take 6.25 mg by mouth 2 (two) times daily with a meal.     cholecalciferol 1000 units tablet  Commonly known as:  VITAMIN D  Take 1,000 Units by mouth daily.     feeding supplement Liqd  Take 1 Container by mouth 3 (three) times daily between meals.     furosemide 40 MG tablet  Commonly known as:  LASIX  Take 40 mg by mouth daily.     GUAIFENESIN PO  Take 400 mg by mouth 2 (two) times daily.     hydrALAZINE 25 MG tablet  Commonly known as:  APRESOLINE  Take 25 mg by mouth daily.     isosorbide mononitrate 30 MG 24 hr tablet  Commonly known as:  IMDUR  Take 1 tablet (30 mg total) by mouth daily.     lisinopril 5 MG tablet    Commonly known as:  PRINIVIL,ZESTRIL  Take 1 tablet (5 mg total) by mouth daily.     memantine 28 MG Cp24 24 hr capsule  Commonly known as:  NAMENDA XR  Take 28 mg by mouth daily.     mirtazapine 15 MG tablet  Commonly known as:  REMERON  Take 1 tablet (15 mg total) by mouth at bedtime.     Rivaroxaban 15  MG Tabs tablet  Commonly known as:  XARELTO  Take 1 tablet (15 mg total) by mouth daily with supper.     senna-docusate 8.6-50 MG tablet  Commonly known as:  Senokot-S  Take 2 tablets by mouth at bedtime.     simvastatin 20 MG tablet  Commonly known as:  ZOCOR  TAKE 1 TABLET BY MOUTH AT BEDTIME FOR CHOLESTEROL     spironolactone 25 MG tablet  Commonly known as:  ALDACTONE  Take 0.5 tablets (12.5 mg total) by mouth daily.     TYLENOL 325 MG tablet  Generic drug:  acetaminophen  Take 325 mg by mouth every 6 (six) hours as needed for mild pain or moderate pain.        Review of Systems  Unable to perform ROS: Dementia    Immunization History  Administered Date(s) Administered  . Influenza Whole 06/02/2012, 06/01/2013  . Influenza-Unspecified 06/18/2014, 06/22/2015  . Pneumococcal Conjugate-13 07/26/2015  . Pneumococcal Polysaccharide-23 09/03/2007  . Td 09/03/2007   Pertinent  Health Maintenance Due  Topic Date Due  . INFLUENZA VACCINE  04/02/2016  . PNA vac Low Risk Adult  Completed   Fall Risk  08/10/2015 10/11/2013  Falls in the past year? Yes No  Number falls in past yr: 1 -  Injury with Fall? No -  Risk for fall due to : History of fall(s) -  Follow up Falls evaluation completed -   Functional Status Survey:    Filed Vitals:   02/05/16 1043  BP: 122/82  Pulse: 76  Temp: 98 F (36.7 C)  Resp: 20  Weight: 142 lb (64.411 kg)  SpO2: 97%   Body mass index is 18.22 kg/(m^2).  Wt Readings from Last 3 Encounters:  02/05/16 142 lb (64.411 kg)  12/21/15 153 lb (69.4 kg)  12/05/15 156 lb (70.761 kg)   Physical Exam  Constitutional: He is oriented  to person, place, and time. No distress.  HENT:  Head: Normocephalic and atraumatic.  Neck: Normal range of motion. Neck supple. No JVD present. No tracheal deviation present. No thyromegaly present.  Cardiovascular: Normal rate and regular rhythm.   No murmur heard. Pulmonary/Chest: Effort normal and breath sounds normal. No respiratory distress. He has no wheezes.  Abdominal: Soft. Bowel sounds are normal. He exhibits no distension. There is no tenderness.  Musculoskeletal: He exhibits no edema or tenderness.  Lymphadenopathy:    He has no cervical adenopathy.  Neurological: He is alert and oriented to person, place, and time. No cranial nerve deficit.  Skin: Skin is warm and dry. He is not diaphoretic.  Psychiatric: He has a normal mood and affect.    Labs reviewed:  Recent Labs  05/23/15 0117  06/07/15 1326  10/13/15 12/06/15 0437 12/21/15  NA 139  < > 137  < > 143 147* 144  K 3.5  < > 4.3  < > 4.7 4.9 4.4  CL 102  --  103  --   --  110  --   CO2 25  --  24  --   --  24  --   GLUCOSE 96  --  99  --   --  98  --   BUN 38*  < > 30*  < > 35* 49* 40*  CREATININE 1.89*  < > 1.38*  < > 1.9* 1.99* 1.9*  CALCIUM 8.8*  --  9.0  --   --  9.7  --   < > = values in this interval  not displayed.  Recent Labs  03/10/15 1807 06/30/15  AST 48* 18  ALT 51 19  ALKPHOS 70 64  BILITOT 1.2  --   PROT 6.9  --   ALBUMIN 3.8  --     Recent Labs  03/10/15 1807  05/18/15 1616 05/21/15 0800  10/13/15 12/06/15 0437 12/21/15  WBC 8.7  < > 5.3 5.2  < > 6.8 6.0 5.7  NEUTROABS 7.5  --  4.1  --   --   --  4.7  --   HGB 13.5  < > 13.3 11.7*  < > 13.3* 13.8 13.7  HCT 40.5  < > 41.0 36.8*  < > 40* 43.5 42  MCV 95.5  < > 96.7 96.8  --   --  96.7  --   PLT 151  < > 125.0* 113*  < > 150 112* 150  < > = values in this interval not displayed. Lab Results  Component Value Date   TSH 2.18 10/13/2015   Lab Results  Component Value Date   HGBA1C * 01/13/2010    5.7 (NOTE)                                                                        According to the ADA Clinical Practice Recommendations for 2011, when HbA1c is used as a screening test:   >=6.5%   Diagnostic of Diabetes Mellitus           (if abnormal result  is confirmed)  5.7-6.4%   Increased risk of developing Diabetes Mellitus  References:Diagnosis and Classification of Diabetes Mellitus,Diabetes [id number] 1):S62-S69 and Standards of Medical Care in         Diabetes - 2011,Diabetes [id number]  (Suppl 1):S11-S61.   Lab Results  Component Value Date   CHOL 96 06/30/2015   HDL 35 06/30/2015   LDLCALC 49 06/30/2015   TRIG 61 06/30/2015   CHOLHDL 2.3 01/13/2010    Significant Diagnostic Results in last 30 days:  Dg Chest 2 View  01/13/2016  CLINICAL DATA:  82 year old male with cough EXAM: CHEST  2 VIEW COMPARISON:  Chest radiograph dated 05/21/2015 and CT dated 05/21/2015 FINDINGS: There is a small right pleural effusion similar to prior study. There has been interval improvement of previously seen right lung base interstitial and airspace densities. There is no focal consolidation. No pneumothorax. There is a stable cardiomegaly. Median sternotomy wires and CABG vascular clips noted. Left pectoral AICD device. The aorta is tortuous. There is atherosclerotic calcification of the thoracic aorta and aortic arch. There is osteopenia with degenerative changes of the spine. Midthoracic compression deformity and vertebroplasty changes noted. No acute fracture. IMPRESSION: Trace right pleural effusion with improvement of the previously seen right lung base densities. Stable cardiomegaly. Electronically Signed   By: Anner Crete M.D.   On: 01/13/2016 18:32   Ct Head Wo Contrast  01/13/2016  CLINICAL DATA:  Found on floor.  Possible fall. EXAM: CT HEAD WITHOUT CONTRAST CT CERVICAL SPINE WITHOUT CONTRAST TECHNIQUE: Multidetector CT imaging of the head and cervical spine was performed following the standard protocol without  intravenous contrast. Multiplanar CT image reconstructions of the cervical spine were also generated. COMPARISON:  CT head 05/21/2015 FINDINGS: CT HEAD FINDINGS  Advanced atrophy similar to the prior study. Extensive hypodensity throughout the cerebral white matter bilaterally compatible with chronic microvascular ischemia. Negative for acute infarct.  Negative for acute hemorrhage or mass Negative for skull fracture. CT CERVICAL SPINE FINDINGS Healed fracture of C2. Posterior cervical fusion with screws at C1, C2 and C3 with posterior rods and cerclage wire. No change in alignment from prior studies Disc degeneration and spondylosis at C3-4, C4-5, C5-6, and C6-7. Mild facet degeneration Negative for acute fracture. Image quality degraded by motion which is moderate in amount. IMPRESSION: Atrophy and chronic microvascular ischemia. No acute intracranial abnormality. Chronic healed fracture C2 with posterior surgical fusion. No acute cervical spine fracture. Electronically Signed   By: Franchot Gallo M.D.   On: 01/13/2016 19:14   Ct Cervical Spine Wo Contrast  01/13/2016  CLINICAL DATA:  Found on floor.  Possible fall. EXAM: CT HEAD WITHOUT CONTRAST CT CERVICAL SPINE WITHOUT CONTRAST TECHNIQUE: Multidetector CT imaging of the head and cervical spine was performed following the standard protocol without intravenous contrast. Multiplanar CT image reconstructions of the cervical spine were also generated. COMPARISON:  CT head 05/21/2015 FINDINGS: CT HEAD FINDINGS Advanced atrophy similar to the prior study. Extensive hypodensity throughout the cerebral white matter bilaterally compatible with chronic microvascular ischemia. Negative for acute infarct.  Negative for acute hemorrhage or mass Negative for skull fracture. CT CERVICAL SPINE FINDINGS Healed fracture of C2. Posterior cervical fusion with screws at C1, C2 and C3 with posterior rods and cerclage wire. No change in alignment from prior studies Disc degeneration  and spondylosis at C3-4, C4-5, C5-6, and C6-7. Mild facet degeneration Negative for acute fracture. Image quality degraded by motion which is moderate in amount. IMPRESSION: Atrophy and chronic microvascular ischemia. No acute intracranial abnormality. Chronic healed fracture C2 with posterior surgical fusion. No acute cervical spine fracture. Electronically Signed   By: Franchot Gallo M.D.   On: 01/13/2016 19:14    Assessment/Plan 1. Vascular dementia with behavior disturbance Progressively less verbal and less functional over time Continue supportive care and namenda for any added benefit  2. Essential hypertension Improved as previous numbers were running low Continue lisinopril, hydralazine, and Coreg  3. Chronic systolic heart failure (HCC) Stable with no DOE or edema Weight trending down Impedence monitor indicated dryness last week but did not correlate with exam with moist mucus membranes and stable VS Continue Lasix BID and Aldactone due to freq exacerbations and monitor CKD  4. Insomnia Significant improvement Continue Belsomra  5. Chronic midline low back pain without sciatica -improved with new broda chair and PT/OT  6. Weight loss Continue boost daily and encourage wakefulness at meals Most likely due to progression in dementia and functional losses Continue remeron Continue with D2 NTL diet, nutritional consult      Family/ staff Communication: discussed with staff  Cindi Carbon, [place] [phone]

## 2016-02-07 ENCOUNTER — Encounter: Payer: Self-pay | Admitting: Adult Health

## 2016-02-15 LAB — CUP PACEART REMOTE DEVICE CHECK
Brady Statistic AP VS Percent: 0 %
Brady Statistic AS VP Percent: 33 %
Brady Statistic RA Percent Paced: 1 %
Date Time Interrogation Session: 20170530060009
HIGH POWER IMPEDANCE MEASURED VALUE: 42 Ohm
Implantable Lead Implant Date: 20070723
Implantable Lead Implant Date: 20070917
Implantable Lead Location: 753858
Lead Channel Impedance Value: 330 Ohm
Lead Channel Pacing Threshold Amplitude: 1.25 V
Lead Channel Pacing Threshold Pulse Width: 0.5 ms
Lead Channel Pacing Threshold Pulse Width: 1 ms
Lead Channel Sensing Intrinsic Amplitude: 4.3 mV
Lead Channel Setting Pacing Amplitude: 2.5 V
Lead Channel Setting Pacing Pulse Width: 1 ms
MDC IDC LEAD IMPLANT DT: 20070723
MDC IDC LEAD LOCATION: 753859
MDC IDC LEAD LOCATION: 753860
MDC IDC MSMT BATTERY REMAINING LONGEVITY: 11 mo
MDC IDC MSMT BATTERY REMAINING PERCENTAGE: 14 %
MDC IDC MSMT BATTERY VOLTAGE: 2.72 V
MDC IDC MSMT LEADCHNL LV IMPEDANCE VALUE: 600 Ohm
MDC IDC MSMT LEADCHNL LV PACING THRESHOLD AMPLITUDE: 0.75 V
MDC IDC MSMT LEADCHNL RA IMPEDANCE VALUE: 310 Ohm
MDC IDC MSMT LEADCHNL RA PACING THRESHOLD AMPLITUDE: 0.75 V
MDC IDC MSMT LEADCHNL RA PACING THRESHOLD PULSEWIDTH: 0.8 ms
MDC IDC MSMT LEADCHNL RV SENSING INTR AMPL: 11.8 mV
MDC IDC SET LEADCHNL LV PACING AMPLITUDE: 2.5 V
MDC IDC SET LEADCHNL RA PACING AMPLITUDE: 2 V
MDC IDC SET LEADCHNL RV PACING PULSEWIDTH: 0.5 ms
MDC IDC SET LEADCHNL RV SENSING SENSITIVITY: 2 mV
MDC IDC STAT BRADY AP VP PERCENT: 67 %
MDC IDC STAT BRADY AS VS PERCENT: 0 %
Pulse Gen Serial Number: 631860

## 2016-02-20 ENCOUNTER — Encounter: Payer: Self-pay | Admitting: Cardiology

## 2016-03-01 ENCOUNTER — Telehealth: Payer: Self-pay

## 2016-03-01 ENCOUNTER — Ambulatory Visit: Payer: Medicare Other

## 2016-03-01 DIAGNOSIS — Z95810 Presence of automatic (implantable) cardiac defibrillator: Secondary | ICD-10-CM

## 2016-03-01 DIAGNOSIS — I5022 Chronic systolic (congestive) heart failure: Secondary | ICD-10-CM

## 2016-03-01 NOTE — Telephone Encounter (Signed)
Call to [facility] and attempted to assistance nurse to send ICM remote transmission.  Unable to send at this time but she will try the process again today.

## 2016-03-07 NOTE — Progress Notes (Signed)
EPIC Encounter for ICM Monitoring  Patient Name: Jacob Rosales is a 82 y.o. male Date: 03/07/2016 Primary Care Physican: Hollace Kinnier, DO Primary Cardiologist: Lovena Le Electrophysiologist: Lovena Le Dry Weight: unknown Bi-V Pacing:  97%        Thoracic impedence close to baseline suggesting stable fluid levels. Patient resides in SNF.   ICM trend: 03/01/2016     Follow-up plan: ICM clinic phone appointment on 04/10/2016.  Copy of ICM check sent to device physician.   Rosalene Billings, RN 03/07/2016 9:44 AM

## 2016-04-02 ENCOUNTER — Non-Acute Institutional Stay (SKILLED_NURSING_FACILITY): Payer: Medicare Other | Admitting: Internal Medicine

## 2016-04-02 ENCOUNTER — Encounter: Payer: Self-pay | Admitting: Internal Medicine

## 2016-04-02 DIAGNOSIS — I480 Paroxysmal atrial fibrillation: Secondary | ICD-10-CM | POA: Diagnosis not present

## 2016-04-02 DIAGNOSIS — I10 Essential (primary) hypertension: Secondary | ICD-10-CM

## 2016-04-02 DIAGNOSIS — F01518 Vascular dementia, unspecified severity, with other behavioral disturbance: Secondary | ICD-10-CM

## 2016-04-02 DIAGNOSIS — I5022 Chronic systolic (congestive) heart failure: Secondary | ICD-10-CM

## 2016-04-02 DIAGNOSIS — F0151 Vascular dementia with behavioral disturbance: Secondary | ICD-10-CM | POA: Diagnosis not present

## 2016-04-02 DIAGNOSIS — Z95810 Presence of automatic (implantable) cardiac defibrillator: Secondary | ICD-10-CM | POA: Diagnosis not present

## 2016-04-02 DIAGNOSIS — M8080XD Other osteoporosis with current pathological fracture, unspecified site, subsequent encounter for fracture with routine healing: Secondary | ICD-10-CM

## 2016-04-02 DIAGNOSIS — N184 Chronic kidney disease, stage 4 (severe): Secondary | ICD-10-CM | POA: Diagnosis not present

## 2016-04-02 NOTE — Progress Notes (Signed)
Patient ID: Jacob Rosales, male   DOB: 1928-01-08, 82 y.o.   MRN: [id number]  Location:   [place] Room Number: 107 Place of Service:  SNF (31) Provider:  Konrad Hoak L. Mariea Clonts, D.O., C.M.D.  Hollace Kinnier, DO  Patient Care Team: Gayland Curry, DO as PCP - General (Geriatric Medicine) Jarome Matin, MD as Consulting Physician (Dermatology) Evans Lance, MD as Consulting Physician (Cardiology) [facility] Earnie Larsson, MD as Consulting Physician (Neurosurgery) Bjorn Loser, MD as Consulting Physician (Urology)  Extended Emergency Contact Information Primary Emergency Contact: [name],[name] Address: [address]          [place], [place] [address] [place] Phone: [phone] Relation: Spouse Secondary Emergency Contact: [name] Phone: [phone] Relation: Daughter  Code Status:  DNR Goals of care: Advanced Directive information Advanced Directives 05/14/2016  Does patient have an advance directive? Yes  Type of Advance Directive Out of facility DNR (pink MOST or yellow form);[place];Living will  Does patient want to make changes to advanced directive? -  Copy of advanced directive(s) in chart? Yes  Would patient like information on creating an advanced directive? -  Pre-existing out of facility DNR order (yellow form or pink MOST form) Yellow form placed in chart (order not valid for inpatient use);Pink MOST form placed in chart (order not valid for inpatient use)     Chief Complaint  Patient presents with  . Medical Management of Chronic Issues    routine visit    HPI:  Pt is a 82 y.o. male seen today for medical management of chronic diseases.    He moved to skilled care from rehab due to vascular dementia and immobility/vascular parkinsonism.  Weights have trended down ward to 142 lbs, 10+ lb loss since April as of June.  He is up 3 lbs since then.  His intake  has declined.  He continues to spend a lot of time sleeping despite taking a sleeping pill at hs (belsomra).   Vascular dementia with behavior disturbance MMSE 17/30 [facility] 11/30 Sept of 2016 On namenda Has functional decline, requires more assistance  [facility] hypertension BP range 94-132/55-69, no edema   Chronic systolic heart failure (HCC) Weight trending down On lasix and aldactone, arb, beta blocker EF 25% in April of 2016, has implanted impedence monitoring indicating dryness at last check   Insomnia Sleeping well with belsomra nightly and still sleeping much of the day away   Chronic midline low back pain without sciatica Improved pain with new broda chair, has not needed ultram routinely Has a hx of OP with compression fracture Continues with tylenol also  When seen, he was napping in his room.  He answered questions, then rolled over and went back to sleep.He continues to eat regular weekly evening meals with his wife and sometimes Sunday brunch also.  Past Medical History:  Diagnosis Date  . Basal cell carcinoma of skin of lower limb, including hip    . CAD (coronary artery disease)    s/p CABG  . Cardiomyopathy, ischemic    a. s/p STJ CRTD  . Cervicalgia    . Chronic systolic heart failure ([place])    a. Echo 4/16:  EF 25%, diff HK, Ao sclerosis without stenosis, mild AI, mild dilation of aortic root, mod MR, mod LAE, mildly reduced RVSF, PASP 52 mmHg  . Complete AV block ([place])   . Dyslipidemia   . Fracture of C2 vertebra,  closed [facility])    s/p cervical fusion 05/2013  . Hypertension   . Impotence of organic origin    . Insomnia, unspecified    . Lumbago    . Memory loss 07/25/2014  . Osteoporosis    A/P Vertebral compression fx's  . Paroxysmal atrial fibrillation (HCC)     a. intol of coumadin - Rx with Aggrenox  . Prostate cancer ([place])    S/P radiation rx  . Squamous cell carcinoma of skin of lower limb, including hip    . TIA (transient ischemic attack)         . Unspecified vitamin D deficiency     Past Surgical History:  Procedure Laterality Date  . BACK SURGERY    . BiV-ICD  11/21/2010   [employer] ICD Model#CD3231-40 [phone]  . CARDIAC DEFIBRILLATOR PLACEMENT    . COLONOSCOPY  2006   polyps benign  . CORONARY ANGIOPLASTY    . CORONARY ARTERY BYPASS GRAFT    . CYST EXCISION  10/2012   back Dr. Wilhemina Bonito  . CYST REMOVAL NECK  12/2012   basel cell (right) Dr. Sarajane Jews  . DEXA  April 2010  . HERNIA REPAIR    . KYPHOSIS SURGERY    . POSTERIOR CERVICAL FUSION/FORAMINOTOMY N/A 05/21/2013   Procedure: Cervical One, Cervical Two, Cervical Three Posterior cervical fusion with lateral mass fixation;  Surgeon: Charlie Pitter, MD;  Location: [place];  Service: Neurosurgery;  Laterality: N/A;  POSTERIOR CERVICAL FUSION/FORAMINOTOMY LEVEL 2    Allergies  Allergen Reactions  . Warfarin Sodium Other (See Comments)    REACTION: Stomach bleeding  . Amlodipine Besylate Swelling      Medication List       Accurate as of 04/02/16 11:59 PM. Always use your most recent med list.          BELSOMRA 10 MG Tabs Generic drug:  Suvorexant Take 10 mg by mouth at bedtime.   bisacodyl 5 MG EC tablet Commonly known as:  DULCOLAX Take 5 mg by mouth daily as needed for mild constipation or moderate constipation.   calcium carbonate 750 MG chewable tablet Commonly known as:  TUMS EX Chew 2 tablets by mouth 2 (two) times daily.   carvedilol 6.25 MG tablet Commonly known as:  COREG Take 6.25 mg by mouth 2 (two) times daily with a meal.   cholecalciferol 1000 units tablet Commonly known as:  VITAMIN D Take 2,000 Units by mouth daily.   feeding supplement Liqd Take 1 Container by mouth 2 (two) times daily between meals.   furosemide 40 MG tablet Commonly known as:  LASIX Take 40 mg by mouth daily.   GUAIFENESIN PO Take 800 mg by mouth 2 (two) times daily.   hydrALAZINE 25 MG tablet Commonly known as:  APRESOLINE Take 25 mg by  mouth daily.   isosorbide mononitrate 30 MG 24 hr tablet Commonly known as:  IMDUR Take 1 tablet (30 mg total) by mouth daily.   lisinopril 5 MG tablet Commonly known as:  PRINIVIL,ZESTRIL Take 1 tablet (5 mg total) by mouth daily.   memantine 28 MG Cp24 24 hr capsule Commonly known as:  NAMENDA XR Take 28 mg by mouth daily.   mirtazapine 15 MG tablet Commonly known as:  REMERON Take 1 tablet (15 mg total) by mouth at bedtime.   Rivaroxaban 15 MG Tabs tablet Commonly known as:  XARELTO Take 1 tablet (15 mg total) by mouth daily with supper.   senna-docusate 8.6-50 MG  tablet Commonly known as:  Senokot-S Take 2 tablets by mouth every other day.   simvastatin 20 MG tablet Commonly known as:  ZOCOR TAKE 1 TABLET BY MOUTH AT BEDTIME FOR CHOLESTEROL   spironolactone 25 MG tablet Commonly known as:  ALDACTONE Take 0.5 tablets (12.5 mg total) by mouth daily.   traMADol 50 MG tablet Commonly known as:  ULTRAM Take 50 mg by mouth every 8 (eight) hours as needed.   TYLENOL 325 MG tablet Generic drug:  acetaminophen Take 325 mg by mouth every 6 (six) hours as needed for mild pain or moderate pain.       Review of Systems  Constitutional: Positive for activity change, appetite change and fatigue. Negative for chills, diaphoresis and fever.  HENT: Negative for congestion.   Eyes: Negative for visual disturbance.  Respiratory: Positive for shortness of breath. Negative for wheezing.        With minimal exertion chronic now  Cardiovascular: Negative for chest pain, palpitations and leg swelling.  Gastrointestinal: Negative for abdominal pain.  Genitourinary: Positive for frequency. Negative for dysuria.  Musculoskeletal: Negative for back pain.  Skin: Positive for pallor.  Neurological: Positive for weakness. Negative for dizziness and tremors.  Hematological: Bruises/bleeds easily.  Psychiatric/Behavioral: Positive for confusion. Negative for behavioral problems and sleep  disturbance. The patient is not nervous/anxious.     Immunization History  Administered Date(s) Administered  . Influenza Whole 06/02/2012, 06/01/2013  . Influenza-Unspecified 06/18/2014, 06/22/2015  . Pneumococcal Conjugate-13 07/26/2015  . Pneumococcal Polysaccharide-23 09/03/2007  . Td 09/03/2007   Pertinent  Health Maintenance Due  Topic Date Due  . INFLUENZA VACCINE  04/02/2016  . PNA vac Low Risk Adult  Completed   Fall Risk  08/10/2015 10/11/2013  Falls in the past year? Yes No  Number falls in past yr: 1 -  Injury with Fall? No -  Risk for fall due to : History of fall(s) -  Follow up Falls evaluation completed -   Functional Status Survey: Is the patient deaf or have difficulty hearing?: Yes Does the patient have difficulty seeing, even when wearing glasses/contacts?: No Does the patient have difficulty concentrating, remembering, or making decisions?: Yes Does the patient have difficulty walking or climbing stairs?: Yes Does the patient have difficulty dressing or bathing?: Yes Does the patient have difficulty doing errands alone such as visiting a doctor's office or shopping?: Yes   hoyer lift to broda chair Vitals:   04/02/16 1131  BP: (!) 94/55  Pulse: 75  Resp: 20  Temp: 98.8 F (37.1 C)  TempSrc: Oral  SpO2: 95%  Weight: 145 lb (65.8 kg)   Body mass index is 18.62 kg/m. Physical Exam  Constitutional: No distress.  HENT:  Head: Normocephalic and atraumatic.  Cardiovascular:  irreg irreg, 2/6 systolic murmur  Pulmonary/Chest: Breath sounds normal. No respiratory distress. He has no wheezes. He has no rales.  Abdominal: Soft. Bowel sounds are normal.  Musculoskeletal:  Resting in bed, able to move all 4 extremities  Neurological:  Arousable, answers questions, hypophonic  Skin: Skin is warm and dry. There is pallor.  Psychiatric:  Flat affect    Labs reviewed:  Recent Labs  05/23/15 0117  06/07/15 1326  12/06/15 0437 12/21/15 05/09/16  05/13/16 0600  NA 139  < > 137  < > 147* 144 143 151*  K 3.5  < > 4.3  < > 4.9 4.4 4.7 4.9  CL 102  --  103  --  110  --   --   --  CO2 25  --  24  --  24  --   --   --   GLUCOSE 96  --  99  --  98  --   --   --   BUN 38*  < > 30*  < > 49* 40* 58* 56*  CREATININE 1.89*  < > 1.38*  < > 1.99* 1.9* 1.8* 1.6*  CALCIUM 8.8*  --  9.0  --  9.7  --   --   --   < > = values in this interval not displayed.  Recent Labs  06/30/15  AST 18  ALT 19  ALKPHOS 64    Recent Labs  05/21/15 0800  12/06/15 0437 12/21/15 05/09/16 05/13/16 0600  WBC 5.2  < > 6.0 5.7 6.5 8.2  NEUTROABS  --   --  4.7  --   --   --   HGB 11.7*  < > 13.8 13.7 14.4 15.1  HCT 36.8*  < > 43.5 42 42 49  MCV 96.8  --  96.7  --   --   --   PLT 113*  < > 112* 150 191 180  < > = values in this interval not displayed. Lab Results  Component Value Date   TSH 2.18 10/13/2015   Lab Results  Component Value Date   HGBA1C (H) 01/13/2010    5.7 (NOTE)                                                                       According to the ADA Clinical Practice Recommendations for 2011, when HbA1c is used as a screening test:   >=6.5%   Diagnostic of Diabetes Mellitus           (if abnormal result  is confirmed)  5.7-6.4%   Increased risk of developing Diabetes Mellitus  References:Diagnosis and Classification of Diabetes Mellitus,Diabetes [id number] 1):S62-S69 and Standards of Medical Care in         Diabetes - 2011,Diabetes [id number]  (Suppl 1):S11-S61.   Lab Results  Component Value Date   CHOL 96 06/30/2015   HDL 35 06/30/2015   LDLCALC 49 06/30/2015   TRIG 61 06/30/2015   CHOLHDL 2.3 01/13/2010   Cardiology assessments reviewed  Assessment/Plan 1. Vascular dementia with behavior disturbance -is progressing gradually -?benefit of his namenda in skilled environment--only independent at feeding himself at this time--will need to discuss with his wife considering he still has a lot of intensive cardiac  therapies going on  2. Chronic systolic heart failure (HCC) -with EF 25%, on all appropriate meds with somewhat low bp at this point--would be nice if we could reconcile his meds some with #1--?entresto with less diuretics or decrease coreg  3. PAF (paroxysmal atrial fibrillation) ([place]) -continues on xarelto due to high stroke risk/prior small stroke events that led to his dementia (has foot drop also)  4. Osteoporosis with fracture, with routine healing, subsequent encounter -compression fracture -pt no longer able to do weightbearing -is on ca and vitamin D supplements as well as a boost supplement -would not benefit from med with nonambulatory state  5. Chronic kidney disease (CKD), stage IV (severe) (HCC) -avoid nephrotoxic meds and dose adjust other medications as needed -cont current  diuretics per cardiology -avoid nsaids -would need diuretics and arb held if he has a period of dehydration/poor po intake  6. Biventricular automatic implantable cardioverter defibrillator in situ -also has his impedance monitor -getting very aggressive care for an 82 yo with poor functional status -need to discuss with his wife about whether he would have wanted all of this -also, might consider entresto if we are being this intensive with his care  7. Essential hypertension -bp running low recently, if persists, may need to reduce medications  Family/ staff Communication: discussed with SNF nurse  Labs/tests ordered:  No new today, getting frequent bmps with diuretics

## 2016-04-10 ENCOUNTER — Telehealth: Payer: Self-pay

## 2016-04-10 ENCOUNTER — Ambulatory Visit (INDEPENDENT_AMBULATORY_CARE_PROVIDER_SITE_OTHER): Payer: Medicare Other

## 2016-04-10 DIAGNOSIS — I5023 Acute on chronic systolic (congestive) heart failure: Secondary | ICD-10-CM | POA: Diagnosis not present

## 2016-04-10 DIAGNOSIS — Z95810 Presence of automatic (implantable) cardiac defibrillator: Secondary | ICD-10-CM | POA: Diagnosis not present

## 2016-04-10 DIAGNOSIS — I5022 Chronic systolic (congestive) heart failure: Secondary | ICD-10-CM

## 2016-04-10 NOTE — Telephone Encounter (Signed)
Call to [facility] at [phone] and spoke with patient's nurse and requested ICM remote transmission.  She stated she would send it today.

## 2016-04-11 NOTE — Progress Notes (Signed)
EPIC Encounter for ICM Monitoring  Patient Name: Jacob Rosales is a 82 y.o. male Date: 04/11/2016 Primary Care Physican: Hollace Kinnier, DO Primary Cardiologist: Lovena Le Electrophysiologist: Lovena Le Dry Weight: unknown Bi-V Pacing:  96%       Patient resides in [facility].    Thoracic impedance abnormal suggesting fluid accumulation 03/29/2016 to 04/07/2016 and returned to normal on 04/07/2016.  Recommendations: No changes.    ICM trend: 04/10/2016     Follow-up plan: ICM clinic phone appointment on 05/14/2016.  Copy of ICM check sent to SNF physician and NP, and device physician.   Rosalene Billings, RN 04/11/2016 9:05 AM

## 2016-05-05 ENCOUNTER — Encounter: Payer: Self-pay | Admitting: Cardiology

## 2016-05-05 ENCOUNTER — Telehealth: Payer: Self-pay | Admitting: Cardiology

## 2016-05-05 NOTE — Telephone Encounter (Signed)
This encounter was created in error - please disregard.

## 2016-05-05 NOTE — Telephone Encounter (Signed)
Called by Juliann Pulse Well, RN at [facility]  Patient was noted to be having some shaking so they took his BP and it was [id number] systolic.  He has Parkinson's with dementia so they could not get a history of is anything was hurting him or if he felt bad.  Took apical pulse and got HR in the 40's.  Called EMS and on their arrival HR in the 70's with SBP in the 100s.  Discussed with Dr. Caryl Comes.  No capability to get remote monitor check.  Last interrogation in May showed 11 month battery life.  Nurse says HR is irregular and reassured her that he has permanent afib now.  Will hold BB tonight due to low BP.  Please set up ICD check on Tuesday to assure battery is ok.

## 2016-05-07 NOTE — Telephone Encounter (Signed)
Device clinic received most recent remote check on 04/10/2016 with 8.9 months remaining on his battery. Next remote check scheduled for 05/14/2016. We will continue to monitor.

## 2016-05-09 ENCOUNTER — Telehealth: Payer: Self-pay

## 2016-05-09 LAB — BASIC METABOLIC PANEL
BUN: 58 mg/dL — AB (ref 4–21)
CREATININE: 1.8 mg/dL — AB (ref 0.6–1.3)
Glucose: 128 mg/dL
POTASSIUM: 4.7 mmol/L (ref 3.4–5.3)
SODIUM: 143 mmol/L (ref 137–147)

## 2016-05-09 LAB — CBC AND DIFFERENTIAL
HCT: 42 % (ref 41–53)
HEMOGLOBIN: 14.4 g/dL (ref 13.5–17.5)
Platelets: 191 10*3/uL (ref 150–399)
WBC: 6.5 10^3/mL

## 2016-05-09 NOTE — Telephone Encounter (Signed)
Received call back from RN and reported tech services will be sending new monitor which should be received in 2-3 days.  Advised to send a remote transmission when monitor is received and to call after it has been sent.

## 2016-05-09 NOTE — Telephone Encounter (Signed)
Returned call to patient's wife after receiving voice mail message to return call.   Returned call to wife.  She reported one of the nurses told her that the patient's battery was in dire need to be replaced right away.  Advised the last remote transmission 04/10/2016 showed 8.9 month battery life and explained around that time, patient will feel vibratory alert and there is 3 months of battery life after the alert.   Once the vibratory alert happens, patient will have an appointment with Dr Junie Spencer and the battery will be replaced within that 3 month time. Explained Sam, RN at [facility] said the monitor was not plugged up.  Advised the monitor should stay plugged in so the alert for ERI can be received.  Advised I spoke with Sam, RN at [facility] yesterday and he ordered new monitor and will send a transmission for review. She appreciated the update and stated she will talk with [facility] again about making sure the monitor needs to remain plugged in.

## 2016-05-09 NOTE — Telephone Encounter (Signed)
Late entry: Received call from Sam, RN at [facility] on 05/08/2016.  He reported he thought patient had a shock in the last week and wanted to check regarding last remote transmission.  Advised last transmission that was received was August and there has not been any alert that patient had been shocked. Advised nurse to send a remote transmission and explained how to send.    RN called back and stated monitor was not plugged in and after plugging it in, was not able to send transmission.  Provided support tech number for him to call.

## 2016-05-10 ENCOUNTER — Non-Acute Institutional Stay (SKILLED_NURSING_FACILITY): Payer: Medicare Other | Admitting: Adult Health

## 2016-05-10 ENCOUNTER — Telehealth: Payer: Self-pay | Admitting: Cardiology

## 2016-05-10 ENCOUNTER — Encounter: Payer: Self-pay | Admitting: Adult Health

## 2016-05-10 DIAGNOSIS — N184 Chronic kidney disease, stage 4 (severe): Secondary | ICD-10-CM

## 2016-05-10 DIAGNOSIS — R195 Other fecal abnormalities: Secondary | ICD-10-CM | POA: Diagnosis not present

## 2016-05-10 DIAGNOSIS — I5022 Chronic systolic (congestive) heart failure: Secondary | ICD-10-CM | POA: Diagnosis not present

## 2016-05-10 DIAGNOSIS — I480 Paroxysmal atrial fibrillation: Secondary | ICD-10-CM | POA: Diagnosis not present

## 2016-05-10 DIAGNOSIS — Z95810 Presence of automatic (implantable) cardiac defibrillator: Secondary | ICD-10-CM | POA: Diagnosis not present

## 2016-05-10 NOTE — Telephone Encounter (Signed)
Spoke w/ pt nurse informed her we have not received pt remote transmission. Instructed her how to send a manual transmission. Call back once transmission is received.

## 2016-05-10 NOTE — Progress Notes (Signed)
Patient ID: Jacob Rosales, male   DOB: 1928-03-16, 82 y.o.   MRN: [id number]   Location:      Place of Service:  SNF (31) Provider:   Cindi Carbon, ANP [facility] [phone]   Hollace Kinnier, DO  Patient Care Team: Gayland Curry, DO as PCP - General (Geriatric Medicine) Jarome Matin, MD as Consulting Physician (Dermatology) Evans Lance, MD as Consulting Physician (Cardiology) [facility] Earnie Larsson, MD as Consulting Physician (Neurosurgery) Bjorn Loser, MD as Consulting Physician (Urology)  Extended Emergency Contact Information Primary Emergency Contact: Alderfer,[name] Address: [address]          [place], [place] [address] [place] Phone: [phone] Relation: Spouse Secondary Emergency Contact: [place] of [place] Phone: [phone] Relation: Daughter  Code Status:  DNR Goals of care: Advanced Directive information Advanced Directives 05/10/2016  Does patient have an advance directive? Yes  Type of Advance Directive Living will;Out of facility DNR (pink MOST or yellow form)  Does patient want to make changes to advanced directive? -  Copy of advanced directive(s) in chart? Yes  Would patient like information on creating an advanced directive? -  Pre-existing out of facility DNR order (yellow form or pink MOST form) Yellow form placed in chart (order not valid for inpatient use)     Chief Complaint  Patient presents with  . Acute Visit    blood in stool    HPI:  Pt is a 82 y.o. male seen today for blood in stool. He resides in skilled care due to vascular dementia and immobility.  On 9/3 EMS was called due to bradycardia with hr in 40's and BP 70/58. He has an ICD which did not fire/paced rhythm not observed. Upon EMS arrival his hr stabilized and his BP improved. He was not taken to the ED. He has a medtronic bedside device that was not working and a new one has been placed and is  currently being transmitted.   On 9/6 nursing staff noticed frank blood in stool with loose stools. He has had two heme positive stools since 9/6. He was not heme positive today. However, he continues to have loose stool. Senokot dose decreased.  His BUN has trended upward since 4/20. His weight has remained unchanged. He has had no shortness of breath and his VS have remained stable.  -EF 25% in April of 2016 with hx of systolic CHF followed by cards Resident has a hx of afib and is on xarelto 15 mg qd     Past Medical History:  Diagnosis Date  . Basal cell carcinoma of skin of lower limb, including hip    . CAD (coronary artery disease)    s/p CABG  . Cardiomyopathy, ischemic    a. s/p STJ CRTD  . Cervicalgia    . Chronic systolic heart failure ([place])    a. Echo 4/16:  EF 25%, diff HK, Ao sclerosis without stenosis, mild AI, mild dilation of aortic root, mod MR, mod LAE, mildly reduced RVSF, PASP 52 mmHg  . Complete AV block ([place])   . Dyslipidemia   . Fracture of C2 vertebra, closed ([place])    s/p cervical fusion 05/2013  . Hypertension   . Impotence of organic origin    . Insomnia, unspecified    . Lumbago    . Memory loss 07/25/2014  . Osteoporosis    A/P Vertebral compression fx's  . Paroxysmal atrial fibrillation (HCC)  a. intol of coumadin - Rx with Aggrenox  . Prostate cancer ([place])    S/P radiation rx  . Squamous cell carcinoma of skin of lower limb, including hip    . TIA (transient ischemic attack)       . Unspecified vitamin D deficiency     Past Surgical History:  Procedure Laterality Date  . BACK SURGERY    . BiV-ICD  11/21/2010   [employer] ICD Model#CD3231-40 [id number]  . CARDIAC DEFIBRILLATOR PLACEMENT    . COLONOSCOPY  2006   polyps benign  . CORONARY ANGIOPLASTY    . CORONARY ARTERY BYPASS GRAFT    . CYST EXCISION  10/2012   back Dr. Wilhemina Bonito  . CYST REMOVAL NECK  12/2012   basel cell (right) Dr. Sarajane Jews  . DEXA  April 2010  . HERNIA  REPAIR    . KYPHOSIS SURGERY    . POSTERIOR CERVICAL FUSION/FORAMINOTOMY N/A 05/21/2013   Procedure: Cervical One, Cervical Two, Cervical Three Posterior cervical fusion with lateral mass fixation;  Surgeon: Charlie Pitter, MD;  Location: [facility];  Service: Neurosurgery;  Laterality: N/A;  POSTERIOR CERVICAL FUSION/FORAMINOTOMY LEVEL 2    Allergies  Allergen Reactions  . Warfarin Sodium Other (See Comments)    REACTION: Stomach bleeding  . Amlodipine Besylate Swelling      Medication List       Accurate as of 05/10/16 12:01 PM. Always use your most recent med list.          BELSOMRA 10 MG Tabs Generic drug:  Suvorexant Take 10 mg by mouth at bedtime.   bisacodyl 5 MG EC tablet Commonly known as:  DULCOLAX Take 5 mg by mouth daily as needed for mild constipation or moderate constipation.   calcium carbonate 750 MG chewable tablet Commonly known as:  TUMS EX Chew 2 tablets by mouth 2 (two) times daily.   carvedilol 6.25 MG tablet Commonly known as:  COREG Take 6.25 mg by mouth 2 (two) times daily with a meal.   cholecalciferol 1000 units tablet Commonly known as:  VITAMIN D Take 2,000 Units by mouth daily.   feeding supplement Liqd Take 1 Container by mouth 2 (two) times daily between meals.   furosemide 40 MG tablet Commonly known as:  LASIX Take 40 mg by mouth daily.   GUAIFENESIN PO Take 800 mg by mouth 2 (two) times daily.   hydrALAZINE 25 MG tablet Commonly known as:  APRESOLINE Take 25 mg by mouth daily.   isosorbide mononitrate 30 MG 24 hr tablet Commonly known as:  IMDUR Take 1 tablet (30 mg total) by mouth daily.   lisinopril 5 MG tablet Commonly known as:  PRINIVIL,ZESTRIL Take 1 tablet (5 mg total) by mouth daily.   memantine 28 MG Cp24 24 hr capsule Commonly known as:  NAMENDA XR Take 28 mg by mouth daily.   mirtazapine 15 MG tablet Commonly known as:  REMERON Take 1 tablet (15 mg total) by mouth at bedtime.   Rivaroxaban 15 MG Tabs  tablet Commonly known as:  XARELTO Take 1 tablet (15 mg total) by mouth daily with supper.   senna-docusate 8.6-50 MG tablet Commonly known as:  Senokot-S Take 2 tablets by mouth every other day.   simvastatin 20 MG tablet Commonly known as:  ZOCOR TAKE 1 TABLET BY MOUTH AT BEDTIME FOR CHOLESTEROL   spironolactone 25 MG tablet Commonly known as:  ALDACTONE Take 0.5 tablets (12.5 mg total) by mouth daily.   traMADol 50  MG tablet Commonly known as:  ULTRAM Take 50 mg by mouth every 8 (eight) hours as needed.   TYLENOL 325 MG tablet Generic drug:  acetaminophen Take 325 mg by mouth every 6 (six) hours as needed for mild pain or moderate pain.       Review of Systems  Unable to perform ROS: Dementia    Immunization History  Administered Date(s) Administered  . Influenza Whole 06/02/2012, 06/01/2013  . Influenza-Unspecified 06/18/2014, 06/22/2015  . Pneumococcal Conjugate-13 07/26/2015  . Pneumococcal Polysaccharide-23 09/03/2007  . Td 09/03/2007   Pertinent  Health Maintenance Due  Topic Date Due  . INFLUENZA VACCINE  04/02/2016  . PNA vac Low Risk Adult  Completed   Fall Risk  08/10/2015 10/11/2013  Falls in the past year? Yes No  Number falls in past yr: 1 -  Injury with Fall? No -  Risk for fall due to : History of fall(s) -  Follow up Falls evaluation completed -   Functional Status Survey:    Vitals:   05/10/16 1030  BP: (!) 129/57  Pulse: 75  Resp: (!) 22  Temp: 97.9 F (36.6 C)  SpO2: 94%  Weight: 145 lb 3.2 oz (65.9 kg)   Body mass index is 18.64 kg/m.  Wt Readings from Last 3 Encounters:  05/10/16 145 lb 3.2 oz (65.9 kg)  04/02/16 145 lb (65.8 kg)  02/05/16 142 lb (64.4 kg)   Physical Exam  Constitutional: No distress.  HENT:  Head: Normocephalic and atraumatic.  Nose: Nose normal.  Mouth/Throat: Oropharynx is clear and moist.  Eyes: Conjunctivae are normal.  Neck: Normal range of motion. Neck supple. No tracheal deviation present. No  thyromegaly present.  Cardiovascular: Normal rate, regular rhythm and normal heart sounds.   No murmur heard. ICD pacemaker. No LE edema.   Pulmonary/Chest: Effort normal and breath sounds normal. No respiratory distress. He has no wheezes. He has no rales.  Abdominal: Soft. Bowel sounds are normal. He exhibits no distension. There is no tenderness.  RLQ, slight protrusion which is not tender, warm or red.   Genitourinary: Rectum normal. Rectal exam shows guaiac negative stool.  Genitourinary Comments: DRE performed. No hemorrhoid, obvious lesion or frank blood.   Musculoskeletal: He exhibits no edema or tenderness.  Lymphadenopathy:    He has no cervical adenopathy.  Neurological: He is alert. No cranial nerve deficit.  Skin: Skin is warm and dry. He is not diaphoretic.  Psychiatric: He has a normal mood and affect.  Nursing note and vitals reviewed.   Labs reviewed:  Recent Labs  05/23/15 0117  06/07/15 1326  12/06/15 0437 12/21/15 05/09/16  NA 139  < > 137  < > 147* 144 143  K 3.5  < > 4.3  < > 4.9 4.4 4.7  CL 102  --  103  --  110  --   --   CO2 25  --  24  --  24  --   --   GLUCOSE 96  --  99  --  98  --   --   BUN 38*  < > 30*  < > 49* 40* 58*  CREATININE 1.89*  < > 1.38*  < > 1.99* 1.9* 1.8*  CALCIUM 8.8*  --  9.0  --  9.7  --   --   < > = values in this interval not displayed.  Recent Labs  06/30/15  AST 18  ALT 19  ALKPHOS 64    Recent Labs  05/18/15 1616 05/21/15 0800  12/06/15 0437 12/21/15 05/09/16  WBC 5.3 5.2  < > 6.0 5.7 6.5  NEUTROABS 4.1  --   --  4.7  --   --   HGB 13.3 11.7*  < > 13.8 13.7 14.4  HCT 41.0 36.8*  < > 43.5 42 42  MCV 96.7 96.8  --  96.7  --   --   PLT 125.0* 113*  < > 112* 150 191  < > = values in this interval not displayed. Lab Results  Component Value Date   TSH 2.18 10/13/2015   Lab Results  Component Value Date   HGBA1C (H) 01/13/2010    5.7 (NOTE)                                                                        According to the ADA Clinical Practice Recommendations for 2011, when HbA1c is used as a screening test:   >=6.5%   Diagnostic of Diabetes Mellitus           (if abnormal result  is confirmed)  5.7-6.4%   Increased risk of developing Diabetes Mellitus  References:Diagnosis and Classification of Diabetes Mellitus,Diabetes [id number] 1):S62-S69 and Standards of Medical Care in         Diabetes - 2011,Diabetes [id number]  (Suppl 1):S11-S61.   Lab Results  Component Value Date   CHOL 96 06/30/2015   HDL 35 06/30/2015   LDLCALC 49 06/30/2015   TRIG 61 06/30/2015   CHOLHDL 2.3 01/13/2010    Significant Diagnostic Results in last 30 days:  No results found.  Assessment/Plan 1. Heme positive stool - Resolved - He is not a candidate for GI referral at this time. His hgb is stable and stool guiac negative today.  - Will continue to monitor with CBC and BMP on 9/11.  -Will monitor for heme positive stools x 2.  -Senokot dose decreased to two PO every other evening and hold for loose stools.  -If further bleeding is noted would consider discontinuing anticoagulation for  Afib.   2. Chronic kidney disease (CKD), stage IV (severe) (HCC) -Stable -His BUN is trending upward which is probably due to poor PO intake, would consider BUN elevation from GIB if symptoms reoccur.  -Will encourage PO intake.  -Will continue to monitor with CBC and BMP on 9/11.  3. Chronic systolic heart failure (HCC) -Stable  -Weights stable -Continue current Lasix dose and aldactone dose   4. Biventricular automatic implantable cardioverter defibrillator in situ -Cardiology has been contacted about management of ICD/pacemaker -BP/pulse stable  5. Afib -Stable -Continue anticoagulant therapy with Xarelto  Family/ staff Communication: discussed with staff  Cindi Carbon, [place] [phone]

## 2016-05-13 LAB — CBC AND DIFFERENTIAL
HCT: 49 % (ref 41–53)
Hemoglobin: 15.1 g/dL (ref 13.5–17.5)
Platelets: 180 10*3/uL (ref 150–399)
WBC: 8.2 10^3/mL

## 2016-05-13 LAB — BASIC METABOLIC PANEL
BUN: 56 mg/dL — AB (ref 4–21)
Creatinine: 1.6 mg/dL — AB (ref 0.6–1.3)
Glucose: 114 mg/dL
Potassium: 4.9 mmol/L (ref 3.4–5.3)
Sodium: 151 mmol/L — AB (ref 137–147)

## 2016-05-13 NOTE — Telephone Encounter (Signed)
LMOVM informing pt nurse that pt remote transmission was received this morning.

## 2016-05-14 ENCOUNTER — Non-Acute Institutional Stay (SKILLED_NURSING_FACILITY): Payer: Medicare Other | Admitting: Internal Medicine

## 2016-05-14 ENCOUNTER — Encounter: Payer: Self-pay | Admitting: Internal Medicine

## 2016-05-14 ENCOUNTER — Ambulatory Visit (INDEPENDENT_AMBULATORY_CARE_PROVIDER_SITE_OTHER): Payer: Medicare Other | Admitting: *Deleted

## 2016-05-14 DIAGNOSIS — I5023 Acute on chronic systolic (congestive) heart failure: Secondary | ICD-10-CM | POA: Diagnosis not present

## 2016-05-14 DIAGNOSIS — R195 Other fecal abnormalities: Secondary | ICD-10-CM

## 2016-05-14 DIAGNOSIS — F01518 Vascular dementia, unspecified severity, with other behavioral disturbance: Secondary | ICD-10-CM

## 2016-05-14 DIAGNOSIS — I509 Heart failure, unspecified: Secondary | ICD-10-CM | POA: Diagnosis not present

## 2016-05-14 DIAGNOSIS — Z95810 Presence of automatic (implantable) cardiac defibrillator: Secondary | ICD-10-CM

## 2016-05-14 DIAGNOSIS — I255 Ischemic cardiomyopathy: Secondary | ICD-10-CM

## 2016-05-14 DIAGNOSIS — E870 Hyperosmolality and hypernatremia: Secondary | ICD-10-CM | POA: Diagnosis not present

## 2016-05-14 DIAGNOSIS — I480 Paroxysmal atrial fibrillation: Secondary | ICD-10-CM

## 2016-05-14 DIAGNOSIS — N184 Chronic kidney disease, stage 4 (severe): Secondary | ICD-10-CM | POA: Diagnosis not present

## 2016-05-14 DIAGNOSIS — F0151 Vascular dementia with behavioral disturbance: Secondary | ICD-10-CM | POA: Diagnosis not present

## 2016-05-14 NOTE — Progress Notes (Signed)
Patient ID: Jacob Rosales, male   DOB: [birth year], 82 y.o.   MRN: [id number]  Location:   [place] Room Number: 107 Place of Service:  SNF (31) Provider:  Eulah Walkup L. Mariea Clonts, D.O., C.M.D.  Hollace Kinnier, DO  Patient Care Team: Gayland Curry, DO as PCP - General (Geriatric Medicine) Jarome Matin, MD as Consulting Physician (Dermatology) Evans Lance, MD as Consulting Physician (Cardiology) [facility] Earnie Larsson, MD as Consulting Physician (Neurosurgery) Bjorn Loser, MD as Consulting Physician (Urology)  Extended Emergency Contact Information Primary Emergency Contact: Treichler,[name] Address: [address]          [place], [place] [address] [place] Phone: [phone] Relation: Spouse Secondary Emergency Contact: [place] of [place] Phone: [phone] Relation: Daughter  Code Status:  DNR Goals of care: Advanced Directive information Advanced Directives 05/14/2016  Does patient have an advance directive? Yes  Type of Advance Directive Out of facility DNR (pink MOST or yellow form);[place];Living will  Does patient want to make changes to advanced directive? -  Copy of advanced directive(s) in chart? Yes  Would patient like information on creating an advanced directive? -  Pre-existing out of facility DNR order (yellow form or pink MOST form) Yellow form placed in chart (order not valid for inpatient use);Pink MOST form placed in chart (order not valid for inpatient use)   Chief Complaint  Patient presents with  . Acute Visit    heme positive stools, bradycardia, chf    HPI:  Pt is a 82 y.o. male with h/o vascular dementia, vascular parkinsonism, systolic chf with EF [id number], immobility seen today for an acute visit for above.   Cardiology notes and impedence monitoring reports reviewed.  Np Wert's note from 9/8 reviewed.    9/3, pt had bradycardia to the 40s with BP said to  be 70/50.  Pt improved and pacer/icd did not fire/pace and did not transmit to cardiology.  Has been reassessed and said to still have battery life left.    9/6, pt noted to have frank blood in his stools (2 loose heme positive stools).  Since, there has been a mix of heme positive and negative stools.  He remains on xarelto for his afib.    He was noted 9/8 to be hypernatremic to 151.  His Na was unchanged on today's labs after fluids were pushed orally.  He was sleeping, as usual, when I went to see him in his room.  He denied any complaints whatsoever.     Past Medical History:  Diagnosis Date  . Basal cell carcinoma of skin of lower limb, including hip    . CAD (coronary artery disease)    s/p CABG  . Cardiomyopathy, ischemic    a. s/p STJ CRTD  . Cervicalgia    . Chronic systolic heart failure ([place])    a. Echo 4/16:  EF 25%, diff HK, Ao sclerosis without stenosis, mild AI, mild dilation of aortic root, mod MR, mod LAE, mildly reduced RVSF, PASP 52 mmHg  . Complete AV block ([place])   . Dyslipidemia   . Fracture of C2 vertebra, closed ([place])    s/p cervical fusion 05/2013  . Hypertension   . Impotence of organic origin    . Insomnia, unspecified    . Lumbago    . Memory loss 07/25/2014  . Osteoporosis    A/P Vertebral compression fx's  . Paroxysmal atrial fibrillation (HCC)  a. intol of coumadin - Rx with Aggrenox  . Prostate cancer ([place])    S/P radiation rx  . Squamous cell carcinoma of skin of lower limb, including hip    . TIA (transient ischemic attack)       . Unspecified vitamin D deficiency     Past Surgical History:  Procedure Laterality Date  . BACK SURGERY    . BiV-ICD  11/21/2010   [employer] ICD Model#CD3231-40 [phone]  . CARDIAC DEFIBRILLATOR PLACEMENT    . COLONOSCOPY  2006   polyps benign  . CORONARY ANGIOPLASTY    . CORONARY ARTERY BYPASS GRAFT    . CYST EXCISION  10/2012   back Dr. Wilhemina Bonito  . CYST REMOVAL NECK  12/2012   basel cell  (right) Dr. Sarajane Jews  . DEXA  April 2010  . HERNIA REPAIR    . KYPHOSIS SURGERY    . POSTERIOR CERVICAL FUSION/FORAMINOTOMY N/A 05/21/2013   Procedure: Cervical One, Cervical Two, Cervical Three Posterior cervical fusion with lateral mass fixation;  Surgeon: Charlie Pitter, MD;  Location: [facility];  Service: Neurosurgery;  Laterality: N/A;  POSTERIOR CERVICAL FUSION/FORAMINOTOMY LEVEL 2    Allergies  Allergen Reactions  . Warfarin Sodium Other (See Comments)    REACTION: Stomach bleeding  . Amlodipine Besylate Swelling      Medication List       Accurate as of 05/14/16 11:59 PM. Always use your most recent med list.          BELSOMRA 10 MG Tabs Generic drug:  Suvorexant Take 10 mg by mouth at bedtime.   bisacodyl 5 MG EC tablet Commonly known as:  DULCOLAX Take 5 mg by mouth daily as needed for mild constipation or moderate constipation.   calcium carbonate 750 MG chewable tablet Commonly known as:  TUMS EX Chew 2 tablets by mouth 2 (two) times daily.   carvedilol 6.25 MG tablet Commonly known as:  COREG Take 6.25 mg by mouth 2 (two) times daily with a meal.   cholecalciferol 1000 units tablet Commonly known as:  VITAMIN D Take 2,000 Units by mouth daily.   feeding supplement Liqd Take 1 Container by mouth 2 (two) times daily between meals.   furosemide 40 MG tablet Commonly known as:  LASIX Take 40 mg by mouth daily.   GUAIFENESIN PO Take 800 mg by mouth 2 (two) times daily.   hydrALAZINE 25 MG tablet Commonly known as:  APRESOLINE Take 25 mg by mouth daily.   isosorbide mononitrate 30 MG 24 hr tablet Commonly known as:  IMDUR Take 1 tablet (30 mg total) by mouth daily.   lisinopril 5 MG tablet Commonly known as:  PRINIVIL,ZESTRIL Take 1 tablet (5 mg total) by mouth daily.   memantine 28 MG Cp24 24 hr capsule Commonly known as:  NAMENDA XR Take 28 mg by mouth daily.   mirtazapine 15 MG tablet Commonly known as:  REMERON Take 1 tablet (15 mg total) by  mouth at bedtime.   Rivaroxaban 15 MG Tabs tablet Commonly known as:  XARELTO Take 1 tablet (15 mg total) by mouth daily with supper.   senna-docusate 8.6-50 MG tablet Commonly known as:  Senokot-S Take 2 tablets by mouth every other day.   simvastatin 20 MG tablet Commonly known as:  ZOCOR TAKE 1 TABLET BY MOUTH AT BEDTIME FOR CHOLESTEROL   traMADol 50 MG tablet Commonly known as:  ULTRAM Take 50 mg by mouth every 8 (eight) hours as needed.  TYLENOL 325 MG tablet Generic drug:  acetaminophen Take 325 mg by mouth every 6 (six) hours as needed for mild pain or moderate pain.       Review of Systems  Constitutional: Positive for appetite change and fatigue. Negative for activity change, chills and fever.  HENT: Negative for congestion.   Respiratory: Negative for chest tightness and shortness of breath.   Cardiovascular: Negative for chest pain, palpitations and leg swelling.  Gastrointestinal: Positive for blood in stool. Negative for abdominal distention, abdominal pain, constipation, diarrhea, nausea, rectal pain and vomiting.  Genitourinary: Negative for dysuria.  Musculoskeletal: Positive for back pain and gait problem.  Neurological: Positive for tremors and weakness.  Hematological: Bruises/bleeds easily.  Psychiatric/Behavioral: Positive for confusion.       Sleeping well now    Immunization History  Administered Date(s) Administered  . Influenza Whole 06/02/2012, 06/01/2013  . Influenza-Unspecified 06/18/2014, 06/22/2015  . Pneumococcal Conjugate-13 07/26/2015  . Pneumococcal Polysaccharide-23 09/03/2007  . Td 09/03/2007   Pertinent  Health Maintenance Due  Topic Date Due  . INFLUENZA VACCINE  04/02/2016  . PNA vac Low Risk Adult  Completed   Fall Risk  08/10/2015 10/11/2013  Falls in the past year? Yes No  Number falls in past yr: 1 -  Injury with Fall? No -  Risk for fall due to : History of fall(s) -  Follow up Falls evaluation completed -    Functional Status Survey:  dependent in adls except feeding Needs lift for transfers  Vitals:   05/14/16 1448  BP: 129/84  Pulse: 75  Resp: 16  Temp: 97.9 F (36.6 C)  TempSrc: Oral  SpO2: 96%  Weight: 145 lb (65.8 kg)   Body mass index is 18.62 kg/m. Physical Exam  Constitutional:  Frail white male resting in bed  HENT:  Head: Normocephalic and atraumatic.  Eyes:  glasses  Cardiovascular: Intact distal pulses.   irreg irreg  Pulmonary/Chest: Effort normal and breath sounds normal. No respiratory distress. He has no wheezes. He has no rales. He exhibits no tenderness.  Abdominal: Soft. Bowel sounds are normal. He exhibits no distension and no mass. There is no tenderness. There is no rebound and no guarding.  Skin: Skin is warm and dry. There is pallor.  Not changed  Psychiatric:  Flat affect, hypophonia    Labs reviewed:  Recent Labs  05/23/15 0117  06/07/15 1326  12/06/15 0437 12/21/15 05/09/16 05/13/16 0600  NA 139  < > 137  < > 147* 144 143 151*  K 3.5  < > 4.3  < > 4.9 4.4 4.7 4.9  CL 102  --  103  --  110  --   --   --   CO2 25  --  24  --  24  --   --   --   GLUCOSE 96  --  99  --  98  --   --   --   BUN 38*  < > 30*  < > 49* 40* 58* 56*  CREATININE 1.89*  < > 1.38*  < > 1.99* 1.9* 1.8* 1.6*  CALCIUM 8.8*  --  9.0  --  9.7  --   --   --   < > = values in this interval not displayed.  Recent Labs  06/30/15  AST 18  ALT 19  ALKPHOS 64    Recent Labs  05/21/15 0800  12/06/15 0437 12/21/15 05/09/16 05/13/16 0600  WBC 5.2  < > 6.0  5.7 6.5 8.2  NEUTROABS  --   --  4.7  --   --   --   HGB 11.7*  < > 13.8 13.7 14.4 15.1  HCT 36.8*  < > 43.5 42 42 49  MCV 96.8  --  96.7  --   --   --   PLT 113*  < > 112* 150 191 180  < > = values in this interval not displayed. Lab Results  Component Value Date   TSH 2.18 10/13/2015   Lab Results  Component Value Date   HGBA1C (H) 01/13/2010    5.7 (NOTE)                                                                        According to the ADA Clinical Practice Recommendations for 2011, when HbA1c is used as a screening test:   >=6.5%   Diagnostic of Diabetes Mellitus           (if abnormal result  is confirmed)  5.7-6.4%   Increased risk of developing Diabetes Mellitus  References:Diagnosis and Classification of Diabetes Mellitus,Diabetes [id number] 1):S62-S69 and Standards of Medical Care in         Diabetes - 2011,Diabetes [id number]  (Suppl 1):S11-S61.   Lab Results  Component Value Date   CHOL 96 06/30/2015   HDL 35 06/30/2015   LDLCALC 49 06/30/2015   TRIG 61 06/30/2015   CHOLHDL 2.3 01/13/2010    Assessment/Plan 1. Heme positive stool -cont to monitor BMs -f/u cbc, bmp again 9/16 -cont xarelto for now  2. chronic systolic CHF (congestive heart failure), NYHA class 3 (HCC) -would like to reduce his pill burden and also optimize his chf treatment -I realize that entresto can drop bp and diuretics can often be reduced or stopped and that he does have CKD so will keep him on a low dose -is off aldactone right now and lasix held yesterday, was given today -will stop his lisinopril and wait 2 days, then start entresto 24mg /26mg  po bid and monitor his bp -also may need drug rep assistance for coverage, but he meets criteria from what I recall -may need to stop lasix  3. Chronic kidney disease (CKD), stage IV (severe) (HCC) -is advance stage, avoid nephrotoxic meds and dose reduce as appropriate -given this and his elevated sodium at present, will start IVF with D5W at 70cc/hr x 1 liter, then recheck bmp when completed  4. PAF (paroxysmal atrial fibrillation) ([place]) -continue xarelto, but would stop if he has a drop in hgb with his heme positive stools (not a candidate for GI scope)  5. 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

## 2016-05-14 NOTE — Progress Notes (Signed)
Remote ICD transmission.   

## 2016-05-14 NOTE — Progress Notes (Signed)
EPIC Encounter for ICM Monitoring  Patient Name: Jacob Rosales is a 82 y.o. male Date: 05/14/2016 Primary Care Physican: Hollace Kinnier, DO Primary Cardiologist: Lovena Le Electrophysiologist: Lovena Le Dry Weight: unknown Bi-V Pacing:  96%  AT/AF Burden >99%  Total AT/AF Burden 18%  Battery: 7.7 months    Spoke with Sam, Production assistant, radio at [facility] and he stated patient is doing ok at this time.   Thoracic impedance normal   [facility] general number [facility] at [phone] and RN supervisor, Sam, at [phone].  Recommendations: No changes.      Follow-up plan: ICM clinic phone appointment on 06/19/2016.  Copy of ICM check sent to device physician.   ICM trend: 05/14/2016       Rosalene Billings, RN 05/14/2016 11:28 AM

## 2016-05-16 ENCOUNTER — Encounter: Payer: Self-pay | Admitting: Cardiology

## 2016-05-16 ENCOUNTER — Non-Acute Institutional Stay (SKILLED_NURSING_FACILITY): Payer: Medicare Other | Admitting: Adult Health

## 2016-05-16 ENCOUNTER — Encounter: Payer: Self-pay | Admitting: Adult Health

## 2016-05-16 DIAGNOSIS — E870 Hyperosmolality and hypernatremia: Secondary | ICD-10-CM | POA: Diagnosis not present

## 2016-05-16 DIAGNOSIS — Z7189 Other specified counseling: Secondary | ICD-10-CM

## 2016-05-16 DIAGNOSIS — I5022 Chronic systolic (congestive) heart failure: Secondary | ICD-10-CM | POA: Diagnosis not present

## 2016-05-16 DIAGNOSIS — R195 Other fecal abnormalities: Secondary | ICD-10-CM | POA: Diagnosis not present

## 2016-05-16 NOTE — Progress Notes (Signed)
[name], NP  Rosalene Billings, RN        He is dehydrated right now, his sodium is 151 and we have his diuretics on hold while monitoring his resp status and weight. He is alert and eating and drinking ok so hopefully we will not need to do IVF, given his CHF. Also, he is heme pos on his stools 4x.,no frank bleeding. Hgb at 14-15 but given his high sodium the H/H are not likely accurate. He is on xarelto currently but not sure he is receiving much benefit from this med given his advanced dementia and the fact he is no longer ambulatory in skilled care and has poor quality of life. If you could pass this along to the doc that would be great. Just thought I would let you know. We will recheck labs on Wed and go from there.  Thanks  Cindi Carbon NP

## 2016-05-16 NOTE — Progress Notes (Signed)
Patient ID: Jacob Rosales, male   DOB: 01-19-1928, 82 y.o.   MRN: [id number]   Location:   [facility]    Place of Service:  SNF (31) Provider:   Cindi Carbon, ANP [facility] [phone]   Hollace Kinnier, DO  Patient Care Team: Gayland Curry, DO as PCP - General (Geriatric Medicine) Jarome Matin, MD as Consulting Physician (Dermatology) Evans Lance, MD as Consulting Physician (Cardiology) [facility] Earnie Larsson, MD as Consulting Physician (Neurosurgery) Bjorn Loser, MD as Consulting Physician (Urology)  Extended Emergency Contact Information Primary Emergency Contact: Strohmeyer,[name] Address: [address]          [place], [place] [address] [place] Phone: [phone] Relation: Spouse Secondary Emergency Contact: [place] Phone: [phone] Relation: Daughter  Code Status:  DNR Goals of care: Advanced Directive information Advanced Directives 05/14/2016  Does patient have an advance directive? Yes  Type of Advance Directive Out of facility DNR (pink MOST or yellow form);[place];Living will  Does patient want to make changes to advanced directive? -  Copy of advanced directive(s) in chart? Yes  Would patient like information on creating an advanced directive? -  Pre-existing out of facility DNR order (yellow form or pink MOST form) Yellow form placed in chart (order not valid for inpatient use);Pink MOST form placed in chart (order not valid for inpatient use)     Chief Complaint  Patient presents with  . Acute Visit    hypernatremia    HPI:  Pt is a 82 y.o. male seen today for hypernatremia. He resides in skilled care due to vascular dementia and immobility.  Labs showed a Na of 151 and a BUN of 56 on 9/11, diuretics were held and fluid encourage but his NA did not improved. An IV was placed on 9/13 and he received 1L of D5W.  Labs were redrawn and his NA  was 147, BUN 50. His baseline BUN is in the 40's, Cr ranges 1.5-1.9 baseline.  On 9/6 nursing staff noticed frank blood in stool with loose stools. He has had five heme positive stools since 9/14. Had some diarrhea which resolved with reduction of his senokot.   His weight has remained unchanged. He has had no shortness of breath and his VS have remained stable.  -EF 25% in April of 2016 with hx of systolic CHF followed by cards Resident has a hx of afib and is on xarelto 15 mg qd He was started on entresto on 9/12 and his BP has tolerated this well. He has no complaints today for our visit. Staff report that he is eating and drinking well  No abd pain, n, v noted. Rectal exam normal at last visit.   Past Medical History:  Diagnosis Date  . Basal cell carcinoma of skin of lower limb, including hip    . CAD (coronary artery disease)    s/p CABG  . Cardiomyopathy, ischemic    a. s/p STJ CRTD  . Cervicalgia    . Chronic systolic heart failure ([place])    a. Echo 4/16:  EF 25%, diff HK, Ao sclerosis without stenosis, mild AI, mild dilation of aortic root, mod MR, mod LAE, mildly reduced RVSF, PASP 52 mmHg  . Complete AV block ([place])   . Dyslipidemia   . Fracture of C2 vertebra, closed ([place])    s/p cervical fusion 05/2013  . Hypertension   . Impotence of organic origin    .  Insomnia, unspecified    . Lumbago    . Memory loss 07/25/2014  . Osteoporosis    A/P Vertebral compression fx's  . Paroxysmal atrial fibrillation (HCC)     a. intol of coumadin - Rx with Aggrenox  . Prostate cancer ([place])    S/P radiation rx  . Squamous cell carcinoma of skin of lower limb, including hip    . TIA (transient ischemic attack)       . Unspecified vitamin D deficiency     Past Surgical History:  Procedure Laterality Date  . BACK SURGERY    . BiV-ICD  11/21/2010   [employer] ICD Model#CD3231-40 [phone]  . CARDIAC DEFIBRILLATOR PLACEMENT    . COLONOSCOPY  2006   polyps benign  . CORONARY  ANGIOPLASTY    . CORONARY ARTERY BYPASS GRAFT    . CYST EXCISION  10/2012   back Dr. Wilhemina Bonito  . CYST REMOVAL NECK  12/2012   basel cell (right) Dr. Sarajane Jews  . DEXA  April 2010  . HERNIA REPAIR    . KYPHOSIS SURGERY    . POSTERIOR CERVICAL FUSION/FORAMINOTOMY N/A 05/21/2013   Procedure: Cervical One, Cervical Two, Cervical Three Posterior cervical fusion with lateral mass fixation;  Surgeon: Charlie Pitter, MD;  Location: [facility];  Service: Neurosurgery;  Laterality: N/A;  POSTERIOR CERVICAL FUSION/FORAMINOTOMY LEVEL 2    Allergies  Allergen Reactions  . Warfarin Sodium Other (See Comments)    REACTION: Stomach bleeding  . Amlodipine Besylate Swelling      Medication List       Accurate as of 05/16/16  4:17 PM. Always use your most recent med list.          BELSOMRA 10 MG Tabs Generic drug:  Suvorexant Take 10 mg by mouth at bedtime.   bisacodyl 5 MG EC tablet Commonly known as:  DULCOLAX Take 5 mg by mouth daily as needed for mild constipation or moderate constipation.   calcium carbonate 750 MG chewable tablet Commonly known as:  TUMS EX Chew 2 tablets by mouth 2 (two) times daily.   carvedilol 6.25 MG tablet Commonly known as:  COREG Take 6.25 mg by mouth 2 (two) times daily with a meal.   cholecalciferol 1000 units tablet Commonly known as:  VITAMIN D Take 2,000 Units by mouth daily.   feeding supplement Liqd Take 1 Container by mouth 2 (two) times daily between meals.   furosemide 40 MG tablet Commonly known as:  LASIX Take 40 mg by mouth daily.   GUAIFENESIN PO Take 800 mg by mouth 2 (two) times daily.   hydrALAZINE 25 MG tablet Commonly known as:  APRESOLINE Take 25 mg by mouth daily.   isosorbide mononitrate 30 MG 24 hr tablet Commonly known as:  IMDUR Take 1 tablet (30 mg total) by mouth daily.   memantine 28 MG Cp24 24 hr capsule Commonly known as:  NAMENDA XR Take 28 mg by mouth daily.   mirtazapine 15 MG tablet Commonly known as:   REMERON Take 1 tablet (15 mg total) by mouth at bedtime.   Rivaroxaban 15 MG Tabs tablet Commonly known as:  XARELTO Take 1 tablet (15 mg total) by mouth daily with supper.   senna-docusate 8.6-50 MG tablet Commonly known as:  Senokot-S Take 2 tablets by mouth every other day.   simvastatin 20 MG tablet Commonly known as:  ZOCOR TAKE 1 TABLET BY MOUTH AT BEDTIME FOR CHOLESTEROL   traMADol 50 MG tablet Commonly known as:  ULTRAM Take 50 mg by mouth every 8 (eight) hours as needed.   TYLENOL 325 MG tablet Generic drug:  acetaminophen Take 325 mg by mouth every 6 (six) hours as needed for mild pain or moderate pain.       Review of Systems  Unable to perform ROS: Dementia    Immunization History  Administered Date(s) Administered  . Influenza Whole 06/02/2012, 06/01/2013  . Influenza-Unspecified 06/18/2014, 06/22/2015  . Pneumococcal Conjugate-13 07/26/2015  . Pneumococcal Polysaccharide-23 09/03/2007  . Td 09/03/2007   Pertinent  Health Maintenance Due  Topic Date Due  . INFLUENZA VACCINE  04/02/2016  . PNA vac Low Risk Adult  Completed   Fall Risk  08/10/2015 10/11/2013  Falls in the past year? Yes No  Number falls in past yr: 1 -  Injury with Fall? No -  Risk for fall due to : History of fall(s) -  Follow up Falls evaluation completed -   Functional Status Survey:    Vitals:   05/16/16 1604  BP: 108/62  Pulse: 77  Resp: 20  SpO2: 94%  Weight: 146 lb 1.6 oz (66.3 kg)   Body mass index is 18.76 kg/m.  Wt Readings from Last 3 Encounters:  05/16/16 146 lb 1.6 oz (66.3 kg)  05/14/16 145 lb (65.8 kg)  05/10/16 145 lb 3.2 oz (65.9 kg)   Physical Exam  Constitutional: No distress.  HENT:  Head: Normocephalic and atraumatic.  Nose: Nose normal.  Mouth/Throat: Oropharynx is clear and moist.  Eyes: Conjunctivae are normal.  Neck: Normal range of motion. Neck supple. No tracheal deviation present. No thyromegaly present.  Cardiovascular: Normal rate and  regular rhythm.   No murmur heard. ICD pacemaker. No LE edema.   Pulmonary/Chest: Effort normal and breath sounds normal. No respiratory distress. He has no wheezes. He has no rales.  Abdominal: Soft. Bowel sounds are normal. He exhibits no distension. There is no tenderness.  Genitourinary: Rectum normal.  Musculoskeletal: He exhibits no edema or tenderness.  Lymphadenopathy:    He has no cervical adenopathy.  Neurological: He is alert. No cranial nerve deficit.  Skin: Skin is warm and dry. He is not diaphoretic.  Psychiatric: He has a normal mood and affect.  Nursing note and vitals reviewed.   Labs reviewed:  Recent Labs  05/23/15 0117  06/07/15 1326  12/06/15 0437 12/21/15 05/09/16 05/13/16 0600  NA 139  < > 137  < > 147* 144 143 151*  K 3.5  < > 4.3  < > 4.9 4.4 4.7 4.9  CL 102  --  103  --  110  --   --   --   CO2 25  --  24  --  24  --   --   --   GLUCOSE 96  --  99  --  98  --   --   --   BUN 38*  < > 30*  < > 49* 40* 58* 56*  CREATININE 1.89*  < > 1.38*  < > 1.99* 1.9* 1.8* 1.6*  CALCIUM 8.8*  --  9.0  --  9.7  --   --   --   < > = values in this interval not displayed.  Recent Labs  06/30/15  AST 18  ALT 19  ALKPHOS 64    Recent Labs  05/18/15 1616 05/21/15 0800  12/06/15 0437 12/21/15 05/09/16 05/13/16 0600  WBC 5.3 5.2  < > 6.0 5.7 6.5 8.2  NEUTROABS 4.1  --   --  4.7  --   --   --   HGB 13.3 11.7*  < > 13.8 13.7 14.4 15.1  HCT 41.0 36.8*  < > 43.5 42 42 49  MCV 96.7 96.8  --  96.7  --   --   --   PLT 125.0* 113*  < > 112* 150 191 180  < > = values in this interval not displayed. Lab Results  Component Value Date   TSH 2.18 10/13/2015   Lab Results  Component Value Date   HGBA1C (H) 01/13/2010    5.7 (NOTE)                                                                       According to the ADA Clinical Practice Recommendations for 2011, when HbA1c is used as a screening test:   >=6.5%   Diagnostic of Diabetes Mellitus           (if abnormal  result  is confirmed)  5.7-6.4%   Increased risk of developing Diabetes Mellitus  References:Diagnosis and Classification of Diabetes Mellitus,Diabetes [id number] 1):S62-S69 and Standards of Medical Care in         Diabetes - 2011,Diabetes [id number]  (Suppl 1):S11-S61.   Lab Results  Component Value Date   CHOL 96 06/30/2015   HDL 35 06/30/2015   LDLCALC 49 06/30/2015   TRIG 61 06/30/2015   CHOLHDL 2.3 01/13/2010    Significant Diagnostic Results in last 30 days:  No results found.  Assessment/Plan 1. Hypernatremia -Give D5W at 75 cc/hr for 1 more liter -recheck BMP -staff reports that he eats and drinks well, ?? If this is due to GI losses, however, there is no further loose stools or obvious bloody stools -he is prone to dehydration due to the fact that he has dementia and dysphagia and is on thickened liquid -may use condom cath  2. Heme positive stool -begin protonix 40 mg qd -no GI work up due to age/debility/dementia -If further bleeding is noted would consider discontinuing anticoagulation for  Afib -CBC pending   3. Chronic systolic heart failure (HCC) -Stable  -Weights stable -lasix on hold, continue entresto   4. Advanced care planning  -I had a discussion with Ms. Perezmartinez regarding his care. We discussed the fact that his overall function and cognitive status have declined over time and that he may no longer benefit from anticoagulation given the fact that he is heme pos.   We are awaiting his CBC to help [place] come to a decision on this matter and he has an upcoming apt with Cards to further discuss this issue as well. No obvious bleeding at this point, no abd pain.  Family/ staff Communication: discussed with staff  Cindi Carbon, [place] [phone]

## 2016-05-17 ENCOUNTER — Telehealth: Payer: Self-pay | Admitting: Internal Medicine

## 2016-05-17 NOTE — Telephone Encounter (Signed)
New message       1. What dental office are you calling from? Dr Max Fickle  What is your office phone and fax number? Fax [phone] What type of procedure is the patient having performed?  Regular cleaning 2. What date is procedure scheduled? Pt already had initial exam but dr did not do cleaning  What is your question (ex. Antibiotics prior to procedure, holding medication-we need to know how long dentist wants pt to hold med)? New pt to the practice---need something in writing stating whether pt needs to take antibiotics prior to dental treatment

## 2016-05-17 NOTE — Telephone Encounter (Signed)
Ok to clean teeth with no SBE. GT

## 2016-05-20 NOTE — Telephone Encounter (Signed)
Kim in medical records will fax

## 2016-05-20 NOTE — Telephone Encounter (Signed)
05/17/16 note faxed.

## 2016-05-20 NOTE — Telephone Encounter (Signed)
New Message:     She wants to know if pt needs to be pre medicated before dental work? Please fax to [phone]

## 2016-05-20 NOTE — Telephone Encounter (Signed)
New message      Calling to get update on dental treatment with Dr Sheran Spine.  If possible, please fax it today at [phone].

## 2016-05-24 LAB — CUP PACEART REMOTE DEVICE CHECK
Battery Remaining Percentage: 10 %
Brady Statistic AP VP Percent: 70 %
Brady Statistic AS VS Percent: 0 %
Brady Statistic RA Percent Paced: 1 %
Date Time Interrogation Session: 20170912093337
HighPow Impedance: 45 Ohm
Implantable Lead Implant Date: 20070723
Implantable Lead Location: 753858
Implantable Lead Model: 7020
Lead Channel Impedance Value: 340 Ohm
Lead Channel Impedance Value: 350 Ohm
Lead Channel Pacing Threshold Amplitude: 0.75 V
Lead Channel Pacing Threshold Pulse Width: 0.5 ms
Lead Channel Pacing Threshold Pulse Width: 0.8 ms
Lead Channel Sensing Intrinsic Amplitude: 11.8 mV
Lead Channel Sensing Intrinsic Amplitude: 4.3 mV
Lead Channel Setting Pacing Amplitude: 2.5 V
Lead Channel Setting Pacing Amplitude: 2.5 V
Lead Channel Setting Pacing Pulse Width: 0.5 ms
Lead Channel Setting Pacing Pulse Width: 1 ms
Lead Channel Setting Sensing Sensitivity: 2 mV
MDC IDC LEAD IMPLANT DT: 20070723
MDC IDC LEAD IMPLANT DT: 20070917
MDC IDC LEAD LOCATION: 753859
MDC IDC LEAD LOCATION: 753860
MDC IDC MSMT BATTERY REMAINING LONGEVITY: 8 mo
MDC IDC MSMT BATTERY VOLTAGE: 2.68 V
MDC IDC MSMT LEADCHNL LV IMPEDANCE VALUE: 610 Ohm
MDC IDC MSMT LEADCHNL LV PACING THRESHOLD PULSEWIDTH: 1 ms
MDC IDC MSMT LEADCHNL RA PACING THRESHOLD AMPLITUDE: 0.75 V
MDC IDC MSMT LEADCHNL RV PACING THRESHOLD AMPLITUDE: 1.25 V
MDC IDC SET LEADCHNL RA PACING AMPLITUDE: 2 V
MDC IDC STAT BRADY AP VS PERCENT: 1 %
MDC IDC STAT BRADY AS VP PERCENT: 30 %
Pulse Gen Serial Number: 631860

## 2016-06-03 ENCOUNTER — Non-Acute Institutional Stay (SKILLED_NURSING_FACILITY): Payer: Medicare Other | Admitting: Adult Health

## 2016-06-03 ENCOUNTER — Encounter: Payer: Self-pay | Admitting: Adult Health

## 2016-06-03 DIAGNOSIS — I5022 Chronic systolic (congestive) heart failure: Secondary | ICD-10-CM | POA: Diagnosis not present

## 2016-06-03 DIAGNOSIS — R0602 Shortness of breath: Secondary | ICD-10-CM | POA: Diagnosis not present

## 2016-06-03 NOTE — Progress Notes (Signed)
Location:  Occupational psychologist of Service:  SNF (31) Provider:   Cindi Carbon, ANP [place] [phone]   REED, Jonelle Sidle, DO  Patient Care Team: Gayland Curry, DO as PCP - General (Geriatric Medicine) Jarome Matin, MD as Consulting Physician (Dermatology) Evans Lance, MD as Consulting Physician (Cardiology) [facility] Earnie Larsson, MD as Consulting Physician (Neurosurgery) Bjorn Loser, MD as Consulting Physician (Urology)  Extended Emergency Contact Information Primary Emergency Contact: Kampf,[name] Address: [address]          [place], [place] [address] [place] Phone: [phone] Relation: Spouse Secondary Emergency Contact: [facility] [place] Phone: [phone] Relation: Daughter  Code Status:  DNR Goals of care: Advanced Directive information Advanced Directives 05/14/2016  Does patient have an advance directive? Yes  Type of Advance Directive Out of facility DNR (pink MOST or yellow form);[place];Living will  Does patient want to make changes to advanced directive? -  Copy of advanced directive(s) in chart? Yes  Would patient like information on creating an advanced directive? -  Pre-existing out of facility DNR order (yellow form or pink MOST form) Yellow form placed in chart (order not valid for inpatient use);Pink MOST form placed in chart (order not valid for inpatient use)     Chief Complaint  Patient presents with  . Acute Visit    sob    HPI:  Pt is a 82 y.o. male seen today for an acute visit for increased work of breathing. He has a hx of systolic CHF with an EF of 25%.  Started on [place] with lasix held two weeks ago. He was having issus with hydration and high sodium which precipitated these changes. His weight is stable at 147 lbs. There is no edema. Staff reports increased work of breathing for 1 day. No fever. Sats 90  % RA.  He has chronic sputum production and cough due to dysphagia and allergic rhinitis. Sputum today is slightly brown color but he just drank a boost.  Bowels are moving normally with no frank blood. Heme pos stool noted in sept but was not worked up due to his age/debility and his H/H has remained stable despite being on anticoagulation for afib. He was started on protonix in Sept as well.  Noted recent teeth cleaning last week.   Past Medical History:  Diagnosis Date  . Basal cell carcinoma of skin of lower limb, including hip    . CAD (coronary artery disease)    s/p CABG  . Cardiomyopathy, ischemic    a. s/p STJ CRTD  . Cervicalgia    . Chronic systolic heart failure ([place])    a. Echo 4/16:  EF 25%, diff HK, Ao sclerosis without stenosis, mild AI, mild dilation of aortic root, mod MR, mod LAE, mildly reduced RVSF, PASP 52 mmHg  . Complete AV block ([place])   . Dyslipidemia   . Fracture of C2 vertebra, closed ([place])    s/p cervical fusion 05/2013  . Hypertension   . Impotence of organic origin    . Insomnia, unspecified    . Lumbago    . Memory loss 07/25/2014  . Osteoporosis    A/P Vertebral compression fx's  . Paroxysmal atrial fibrillation (HCC)     a. intol of coumadin - Rx with Aggrenox  . Prostate cancer ([place])    S/P radiation rx  . Squamous cell carcinoma of skin of lower limb,  including hip    . TIA (transient ischemic attack)       . Unspecified vitamin D deficiency     Past Surgical History:  Procedure Laterality Date  . BACK SURGERY    . BiV-ICD  11/21/2010   [employer] ICD Model#CD3231-40 [phone]  . CARDIAC DEFIBRILLATOR PLACEMENT    . COLONOSCOPY  2006   polyps benign  . CORONARY ANGIOPLASTY    . CORONARY ARTERY BYPASS GRAFT    . CYST EXCISION  10/2012   back Dr. Wilhemina Bonito  . CYST REMOVAL NECK  12/2012   basel cell (right) Dr. Sarajane Jews  . DEXA  April 2010  . HERNIA REPAIR    . KYPHOSIS SURGERY    . POSTERIOR CERVICAL FUSION/FORAMINOTOMY N/A  05/21/2013   Procedure: Cervical One, Cervical Two, Cervical Three Posterior cervical fusion with lateral mass fixation;  Surgeon: Charlie Pitter, MD;  Location: [facility];  Service: Neurosurgery;  Laterality: N/A;  POSTERIOR CERVICAL FUSION/FORAMINOTOMY LEVEL 2    Allergies  Allergen Reactions  . Warfarin Sodium Other (See Comments)    REACTION: Stomach bleeding  . Amlodipine Besylate Swelling      Medication List       Accurate as of 06/03/16 11:05 AM. Always use your most recent med list.          BELSOMRA 10 MG Tabs Generic drug:  Suvorexant Take 10 mg by mouth at bedtime.   bisacodyl 5 MG EC tablet Commonly known as:  DULCOLAX Take 5 mg by mouth daily as needed for mild constipation or moderate constipation.   calcium carbonate 750 MG chewable tablet Commonly known as:  TUMS EX Chew 2 tablets by mouth 2 (two) times daily.   carvedilol 6.25 MG tablet Commonly known as:  COREG Take 6.25 mg by mouth 2 (two) times daily with a meal.   cholecalciferol 1000 units tablet Commonly known as:  VITAMIN D Take 2,000 Units by mouth daily.   ENTRESTO 24-26 MG Generic drug:  sacubitril-valsartan Take 1 tablet by mouth 2 (two) times daily.   feeding supplement Liqd Take 1 Container by mouth 2 (two) times daily between meals.   furosemide 40 MG tablet Commonly known as:  LASIX Take 40 mg by mouth daily.   GUAIFENESIN PO Take 800 mg by mouth 2 (two) times daily.   hydrALAZINE 25 MG tablet Commonly known as:  APRESOLINE Take 25 mg by mouth daily.   isosorbide mononitrate 30 MG 24 hr tablet Commonly known as:  IMDUR Take 1 tablet (30 mg total) by mouth daily.   memantine 28 MG Cp24 24 hr capsule Commonly known as:  NAMENDA XR Take 28 mg by mouth daily.   mirtazapine 15 MG tablet Commonly known as:  REMERON Take 1 tablet (15 mg total) by mouth at bedtime.   Rivaroxaban 15 MG Tabs tablet Commonly known as:  XARELTO Take 1 tablet (15 mg total) by mouth daily with  supper.   simvastatin 20 MG tablet Commonly known as:  ZOCOR TAKE 1 TABLET BY MOUTH AT BEDTIME FOR CHOLESTEROL   traMADol 50 MG tablet Commonly known as:  ULTRAM Take 50 mg by mouth every 8 (eight) hours as needed.   TYLENOL 325 MG tablet Generic drug:  acetaminophen Take 325 mg by mouth every 6 (six) hours as needed for mild pain or moderate pain.       Review of Systems  Constitutional: Negative for activity change, appetite change, chills, diaphoresis, fatigue, fever and unexpected weight change.  HENT: Positive for rhinorrhea and trouble swallowing. Negative for congestion, ear discharge, ear pain, sneezing and sore throat.   Respiratory: Positive for shortness of breath. Negative for cough, wheezing and stridor.   Cardiovascular: Negative for chest pain, palpitations and leg swelling.  Gastrointestinal: Negative for abdominal distention, abdominal pain, constipation and diarrhea.  Genitourinary: Negative for difficulty urinating and dysuria.  Musculoskeletal: Positive for gait problem. Negative for arthralgias, back pain, joint swelling and myalgias.  Neurological: Negative for dizziness, seizures, syncope, facial asymmetry, speech difficulty, weakness and headaches.  Hematological: Negative for adenopathy. Does not bruise/bleed easily.  Psychiatric/Behavioral: Positive for confusion. Negative for agitation and behavioral problems.    Immunization History  Administered Date(s) Administered  . Influenza Whole 06/02/2012, 06/01/2013  . Influenza-Unspecified 06/18/2014, 06/22/2015  . Pneumococcal Conjugate-13 07/26/2015  . Pneumococcal Polysaccharide-23 09/03/2007  . Td 09/03/2007   Pertinent  Health Maintenance Due  Topic Date Due  . INFLUENZA VACCINE  04/02/2016  . PNA vac Low Risk Adult  Completed   Fall Risk  08/10/2015 10/11/2013  Falls in the past year? Yes No  Number falls in past yr: 1 -  Injury with Fall? No -  Risk for fall due to : History of fall(s) -   Follow up Falls evaluation completed -   Functional Status Survey:    Vitals:   06/03/16 1047  BP: (!) 130/91  Pulse: 71  Resp: (!) 22  Temp: 98.1 F (36.7 C)  SpO2: 90%  Weight: 147 lb (66.7 kg)   Body mass index is 18.87 kg/m. Physical Exam  Constitutional: He is oriented to person, place, and time. No distress.  HENT:  Head: Normocephalic and atraumatic.  Right Ear: External ear normal.  Left Ear: External ear normal.  Mouth/Throat: Oropharynx is clear and moist. No oropharyngeal exudate.  Clear drainage to both nares  Neck: Normal range of motion. Neck supple. No JVD present. No tracheal deviation present. No thyromegaly present.  Cardiovascular: Normal rate and regular rhythm.   No murmur heard. Pulmonary/Chest: Effort normal. No respiratory distress. He has no wheezes. He has rales.  Slight increase work of breathing. Has shallow breathing chronically. Bilat rhonchi noted.   Abdominal: Soft. Bowel sounds are normal. He exhibits no distension. There is no tenderness.  Lymphadenopathy:    He has no cervical adenopathy.  Neurological: He is alert and oriented to person, place, and time. No cranial nerve deficit.  Skin: Skin is warm and dry. He is not diaphoretic.  Psychiatric: He has a normal mood and affect.  Nursing note and vitals reviewed.   Labs reviewed:  Recent Labs  06/07/15 1326  12/06/15 0437 12/21/15 05/09/16 05/13/16 0600  NA 137  < > 147* 144 143 151*  K 4.3  < > 4.9 4.4 4.7 4.9  CL 103  --  110  --   --   --   CO2 24  --  24  --   --   --   GLUCOSE 99  --  98  --   --   --   BUN 30*  < > 49* 40* 58* 56*  CREATININE 1.38*  < > 1.99* 1.9* 1.8* 1.6*  CALCIUM 9.0  --  9.7  --   --   --   < > = values in this interval not displayed.  Recent Labs  06/30/15  AST 18  ALT 19  ALKPHOS 64    Recent Labs  12/06/15 0437 12/21/15 05/09/16 05/13/16 0600  WBC 6.0 5.7  6.5 8.2  NEUTROABS 4.7  --   --   --   HGB 13.8 13.7 14.4 15.1  HCT 43.5 42 42  49  MCV 96.7  --   --   --   PLT 112* 150 191 180   Lab Results  Component Value Date   TSH 2.18 10/13/2015   Lab Results  Component Value Date   HGBA1C (H) 01/13/2010    5.7 (NOTE)                                                                       According to the ADA Clinical Practice Recommendations for 2011, when HbA1c is used as a screening test:   >=6.5%   Diagnostic of Diabetes Mellitus           (if abnormal result  is confirmed)  5.7-6.4%   Increased risk of developing Diabetes Mellitus  References:Diagnosis and Classification of Diabetes Mellitus,Diabetes [id number] 1):S62-S69 and Standards of Medical Care in         Diabetes - 2011,Diabetes Care,2011,34  (Suppl 1):S11-S61.   Lab Results  Component Value Date   CHOL 96 06/30/2015   HDL 35 06/30/2015   LDLCALC 49 06/30/2015   TRIG 61 06/30/2015   CHOLHDL 2.3 01/13/2010    Significant Diagnostic Results in last 30 days:  No results found.  Assessment/Plan  1. Dyspnea -recommend CXR to eval for CHF vs PNA -noted recent dental work -check CBC BMP as if he needs diuresis we will need to know  his NA/K levels (recent hx of hypernatremia) -apply oxygen 2L [place]   2. Chronic Systolic CHF -continue Entresto 24/26 1 tab BID and  Monitor BP -weight stable with mild sob -await CXR and labs -has impedence monitor with no recent reports of fluid overload  Family/ staff Communication: discussed with staff/resident  Labs/tests ordered:  CXR CBC BMP

## 2016-06-04 ENCOUNTER — Telehealth: Payer: Self-pay

## 2016-06-04 NOTE — Telephone Encounter (Signed)
Received call from wife.  She stated patient is due for office defib check with Dr Lovena Le.  Patient will need to remain in wheelchair for office visit because he is not mobile at this time. She asked if he will need to be able to stand or sit on the table for the visit.  Advised I spoke with Dr Tanna Furry nurse and he can remain in wheelchair for the visit and the facility should bring in the last weight recorded.  She stated she would let the facility know to bring the information.  She will be making the appointment for November.

## 2016-06-10 ENCOUNTER — Non-Acute Institutional Stay (SKILLED_NURSING_FACILITY): Payer: Medicare Other | Admitting: Adult Health

## 2016-06-10 ENCOUNTER — Encounter: Payer: Self-pay | Admitting: Adult Health

## 2016-06-10 DIAGNOSIS — E870 Hyperosmolality and hypernatremia: Secondary | ICD-10-CM | POA: Diagnosis not present

## 2016-06-10 DIAGNOSIS — G8929 Other chronic pain: Secondary | ICD-10-CM | POA: Diagnosis not present

## 2016-06-10 DIAGNOSIS — N184 Chronic kidney disease, stage 4 (severe): Secondary | ICD-10-CM

## 2016-06-10 DIAGNOSIS — I5022 Chronic systolic (congestive) heart failure: Secondary | ICD-10-CM

## 2016-06-10 DIAGNOSIS — R0602 Shortness of breath: Secondary | ICD-10-CM | POA: Diagnosis not present

## 2016-06-10 DIAGNOSIS — M545 Low back pain, unspecified: Secondary | ICD-10-CM

## 2016-06-10 NOTE — Progress Notes (Signed)
Location:  [facility]upational psychologist of Service:  SNF (31) Provider:   Cindi Carbon, ANP [place] [phone]   REED, Jonelle Sidle, DO  Patient Care Team: Gayland Curry, DO as PCP - General (Geriatric Medicine) Jarome Matin, MD as Consulting Physician (Dermatology) Evans Lance, MD as Consulting Physician (Cardiology) [facility] Earnie Larsson, MD as Consulting Physician (Neurosurgery) Bjorn Loser, MD as Consulting Physician (Urology)  Extended Emergency Contact Information Primary Emergency Contact: Wolaver,[name] Address: [address]          [place], [place] [address] [place] Phone: [phone] Relation: Spouse Secondary Emergency Contact: [facility] [place] Phone: [phone] Relation: Daughter  Code Status:  DNR Goals of care: Advanced Directive information Advanced Directives 06/10/2016  Does patient have an advance directive? Yes  Type of Advance Directive Out of facility DNR (pink MOST or yellow form);[place];Living will  Does patient want to make changes to advanced directive? -  Copy of advanced directive(s) in chart? Yes  Would patient like information on creating an advanced directive? -  Pre-existing out of facility DNR order (yellow form or pink MOST form) Yellow form placed in chart (order not valid for inpatient use);Pink MOST form placed in chart (order not valid for inpatient use)     Chief Complaint  Patient presents with  . Acute Visit    increased rr    HPI:  Pt is a 82 y.o. male seen today for an acute visit for increased work of breathing. He has a hx of systolic CHF with an EF of 25%.  Started on [place] with lasix held on 05/27/16. He was having issus with hydration and high sodium which precipitated these changes. His weight is stable at 147 lbs. There is no edema. Staff reports increased work of breathing since 10/2.  This has  been intermittent. CXR on 10/2 showed congestive heart failure and pulmonary edema without focal consolidation but was a poor quality. He was given one dose of Lasix IM 40 mg, then 20mg  qd.  On 10/5 NA returned at 147 and so the lasix was held on 10/6 and 10/7 and resumed on 10/8 every other day. BMP pending.  Weight remained the same with no edema. Breathing intermittently improved but it is difficult to assess as he has dementia with agitation and is non ambulatory. He chronically takes shallow breaths.  Sats were normal on 10/2 for the visit but today there have been periods of sats in the mid 80's but have returned to 90% on RA for now. He is not in distress for my visit having received Ultram 50 mg for chronic back pain and agitation. No fevers. He has a chronic congested cough with sputum production, no change from baseline. Has chronic dysphagia. On D2 NTL. BUN/CR 29.4/1.5 on 06/06/16 Staff also report that he is playing in feces on a regular basis. Noted recent teeth cleaning two weeks ago. CXR today showed improved CHF, no consolidation BMP today shows NA of 148  Past Medical History:  Diagnosis Date  . Basal cell carcinoma of skin of lower limb, including hip    . CAD (coronary artery disease)    s/p CABG  . Cardiomyopathy, ischemic    a. s/p STJ CRTD  . Cervicalgia    . Chronic systolic heart failure ([place])    a. Echo 4/16:  EF 25%, diff HK, Ao sclerosis without stenosis, mild AI, mild dilation of  aortic root, mod MR, mod LAE, mildly reduced RVSF, PASP 52 mmHg  . Complete AV block ([place])   . Dyslipidemia   . Fracture of C2 vertebra, closed ([place])    s/p cervical fusion 05/2013  . Hypertension   . Impotence of organic origin    . Insomnia, unspecified    . Lumbago    . Memory loss 07/25/2014  . Osteoporosis    A/P Vertebral compression fx's  . Paroxysmal atrial fibrillation (HCC)     a. intol of coumadin - Rx with Aggrenox  . Prostate cancer ([place])    S/P radiation rx  . Squamous  cell carcinoma of skin of lower limb, including hip    . TIA (transient ischemic attack)       . Unspecified vitamin D deficiency     Past Surgical History:  Procedure Laterality Date  . BACK SURGERY    . BiV-ICD  11/21/2010   [employer] ICD Model#CD3231-40 [phone]  . CARDIAC DEFIBRILLATOR PLACEMENT    . COLONOSCOPY  2006   polyps benign  . CORONARY ANGIOPLASTY    . CORONARY ARTERY BYPASS GRAFT    . CYST EXCISION  10/2012   back Dr. Wilhemina Bonito  . CYST REMOVAL NECK  12/2012   basel cell (right) Dr. Sarajane Jews  . DEXA  April 2010  . HERNIA REPAIR    . KYPHOSIS SURGERY    . POSTERIOR CERVICAL FUSION/FORAMINOTOMY N/A 05/21/2013   Procedure: Cervical One, Cervical Two, Cervical Three Posterior cervical fusion with lateral mass fixation;  Surgeon: Charlie Pitter, MD;  Location: [facility];  Service: Neurosurgery;  Laterality: N/A;  POSTERIOR CERVICAL FUSION/FORAMINOTOMY LEVEL 2    Allergies  Allergen Reactions  . Warfarin Sodium Other (See Comments)    REACTION: Stomach bleeding  . Amlodipine Besylate Swelling      Medication List       Accurate as of 06/10/16 11:43 AM. Always use your most recent med list.          BELSOMRA 10 MG Tabs Generic drug:  Suvorexant Take 10 mg by mouth at bedtime.   bisacodyl 5 MG EC tablet Commonly known as:  DULCOLAX Take 5 mg by mouth daily as needed for mild constipation or moderate constipation.   calcium carbonate 750 MG chewable tablet Commonly known as:  TUMS EX Chew 2 tablets by mouth 2 (two) times daily.   carvedilol 6.25 MG tablet Commonly known as:  COREG Take 6.25 mg by mouth 2 (two) times daily with a meal.   cholecalciferol 1000 units tablet Commonly known as:  VITAMIN D Take 2,000 Units by mouth daily.   docusate sodium 100 MG capsule Commonly known as:  COLACE Take 100 mg by mouth 2 (two) times daily.   ENTRESTO 24-26 MG Generic drug:  sacubitril-valsartan Take 1 tablet by mouth 2 (two) times daily.   feeding  supplement Liqd Take 1 Container by mouth 2 (two) times daily between meals.   furosemide 20 MG tablet Commonly known as:  LASIX Take 20 mg by mouth every other day.   GUAIFENESIN PO Take 800 mg by mouth 2 (two) times daily.   hydrALAZINE 25 MG tablet Commonly known as:  APRESOLINE Take 25 mg by mouth daily.   isosorbide mononitrate 30 MG 24 hr tablet Commonly known as:  IMDUR Take 1 tablet (30 mg total) by mouth daily.   loratadine 10 MG tablet Commonly known as:  CLARITIN Take 10 mg by mouth daily as needed for allergies.  memantine 28 MG Cp24 24 hr capsule Commonly known as:  NAMENDA XR Take 28 mg by mouth daily.   mirtazapine 15 MG tablet Commonly known as:  REMERON Take 1 tablet (15 mg total) by mouth at bedtime.   pantoprazole 40 MG tablet Commonly known as:  PROTONIX Take 40 mg by mouth daily.   Rivaroxaban 15 MG Tabs tablet Commonly known as:  XARELTO Take 1 tablet (15 mg total) by mouth daily with supper.   simvastatin 20 MG tablet Commonly known as:  ZOCOR TAKE 1 TABLET BY MOUTH AT BEDTIME FOR CHOLESTEROL   traMADol 50 MG tablet Commonly known as:  ULTRAM Take 50 mg by mouth every 8 (eight) hours as needed.       Review of Systems  Constitutional: Negative for activity change, appetite change, chills, diaphoresis, fatigue, fever and unexpected weight change.  HENT: Positive for rhinorrhea and trouble swallowing. Negative for congestion, ear discharge, ear pain, sneezing and sore throat.   Respiratory: Positive for cough (chronic with clear/yellow sputum) and shortness of breath. Negative for wheezing and stridor.   Cardiovascular: Negative for chest pain, palpitations and leg swelling.  Gastrointestinal: Negative for abdominal distention, abdominal pain, constipation and diarrhea.  Genitourinary: Negative for difficulty urinating and dysuria.  Musculoskeletal: Positive for back pain and gait problem. Negative for arthralgias, joint swelling and  myalgias.  Neurological: Negative for dizziness, seizures, syncope, facial asymmetry, speech difficulty, weakness and headaches.  Hematological: Negative for adenopathy. Does not bruise/bleed easily.  Psychiatric/Behavioral: Positive for confusion. Negative for agitation and behavioral problems.    Immunization History  Administered Date(s) Administered  . Influenza Whole 06/02/2012, 06/01/2013  . Influenza-Unspecified 06/18/2014, 06/22/2015  . Pneumococcal Conjugate-13 07/26/2015  . Pneumococcal Polysaccharide-23 09/03/2007  . Td 09/03/2007   Pertinent  Health Maintenance Due  Topic Date Due  . INFLUENZA VACCINE  04/02/2016  . PNA vac Low Risk Adult  Completed   Fall Risk  08/10/2015 10/11/2013  Falls in the past year? Yes No  Number falls in past yr: 1 -  Injury with Fall? No -  Risk for fall due to : History of fall(s) -  Follow up Falls evaluation completed -   Functional Status Survey:    Vitals:   06/10/16 1206  BP: 137/83  Pulse: 80  Resp: 20  Temp: 98.3 F (36.8 C)  SpO2: 90%  Weight: 147 lb (66.7 kg)   Body mass index is 18.87 kg/m.  Wt Readings from Last 3 Encounters:  06/10/16 147 lb (66.7 kg)  06/03/16 147 lb (66.7 kg)  05/16/16 146 lb 1.6 oz (66.3 kg)   Physical Exam  Constitutional: No distress.  HENT:  Head: Normocephalic and atraumatic.  Right Ear: External ear normal.  Left Ear: External ear normal.  Nose: Nose normal.  Mouth/Throat: Oropharynx is clear and moist. No oropharyngeal exudate.  Neck: Normal range of motion. Neck supple. No JVD present. No tracheal deviation present. No thyromegaly present.  Cardiovascular: Normal rate and regular rhythm.   No murmur heard. Pulmonary/Chest: Effort normal. No respiratory distress. He has no wheezes. He has rales.   Has shallow breathing chronically. Bilat rhonchi noted.   Abdominal: Soft. Bowel sounds are normal. He exhibits no distension. There is no tenderness.  Musculoskeletal: He exhibits no  edema or tenderness.  Lymphadenopathy:    He has no cervical adenopathy.  Neurological: He is alert. No cranial nerve deficit.  Oriented to self, able to follow commands. Not able to answer q's about his care or symptoms.  Skin: Skin is warm and dry. He is not diaphoretic.  Psychiatric: He has a normal mood and affect.  Nursing note and vitals reviewed.   Labs reviewed:  Recent Labs  12/06/15 0437 12/21/15 05/09/16 05/13/16 0600  NA 147* 144 143 151*  K 4.9 4.4 4.7 4.9  CL 110  --   --   --   CO2 24  --   --   --   GLUCOSE 98  --   --   --   BUN 49* 40* 58* 56*  CREATININE 1.99* 1.9* 1.8* 1.6*  CALCIUM 9.7  --   --   --     Recent Labs  06/30/15  AST 18  ALT 19  ALKPHOS 64    Recent Labs  12/06/15 0437 12/21/15 05/09/16 05/13/16 0600  WBC 6.0 5.7 6.5 8.2  NEUTROABS 4.7  --   --   --   HGB 13.8 13.7 14.4 15.1  HCT 43.5 42 42 49  MCV 96.7  --   --   --   PLT 112* 150 191 180   Lab Results  Component Value Date   TSH 2.18 10/13/2015   Lab Results  Component Value Date   HGBA1C (H) 01/13/2010    5.7 (NOTE)                                                                       According to the ADA Clinical Practice Recommendations for 2011, when HbA1c is used as a screening test:   >=6.5%   Diagnostic of Diabetes Mellitus           (if abnormal result  is confirmed)  5.7-6.4%   Increased risk of developing Diabetes Mellitus  References:Diagnosis and Classification of Diabetes Mellitus,Diabetes [id number] 1):S62-S69 and Standards of Medical Care in         Diabetes - 2011,Diabetes Care,2011,34  (Suppl 1):S11-S61.   Lab Results  Component Value Date   CHOL 96 06/30/2015   HDL 35 06/30/2015   LDLCALC 49 06/30/2015   TRIG 61 06/30/2015   CHOLHDL 2.3 01/13/2010    Significant Diagnostic Results in last 30 days:  No results found.  Assessment/Plan  1. Hypernatremia Na returned this afternoon at 148. Will have staff encourage water q 2 hrs while  awake and recheck in 3 days  2. Chronic kidney disease (CKD), stage IV (severe) (HCC) Stable continue to monitor and avoid nephrotoxic agents  3. Chronic midline low back pain without sciatica Schedule tylenol 650 mg TID Continue ultram 50 mg as needed  4. Shortness of breath Resolved from this am Mild rhonchi on exam F/U CXR shows no focal consolidation, no fever, chest pain, or worsening cough Continue to monitor, if reoccurs may treat empirically for pna as the films are difficult to assess due to his posture  5. Chronic systolic heart failure (HCC) Improved chf on CXR Continue entresto Continue lasix 20 mg qod Weight monitoring   Family/ staff Communication: discussed with staff/resident  Labs/tests ordered:  CXR BMP

## 2016-06-19 ENCOUNTER — Ambulatory Visit (INDEPENDENT_AMBULATORY_CARE_PROVIDER_SITE_OTHER): Payer: Medicare Other

## 2016-06-19 DIAGNOSIS — Z95810 Presence of automatic (implantable) cardiac defibrillator: Secondary | ICD-10-CM

## 2016-06-19 DIAGNOSIS — I5023 Acute on chronic systolic (congestive) heart failure: Secondary | ICD-10-CM | POA: Diagnosis not present

## 2016-06-19 DIAGNOSIS — I509 Heart failure, unspecified: Secondary | ICD-10-CM

## 2016-06-19 NOTE — Progress Notes (Signed)
EPIC Encounter for ICM Monitoring  Patient Name: Jacob Rosales is a 82 y.o. male Date: 06/19/2016 Primary Care Physican: Hollace Kinnier, DO Primary Cardiologist:Taylor Electrophysiologist: Lovena Le Dry Weight:    unknown Bi-V Pacing:  96%         Battery: 6.6 months              Patient currently resides in SNF.    Thoracic impedance abnormal suggesting fluid accumulation 05/14/2016 to 06/04/2016 and returned to normal 06/04/2016.  Follow-up plan: ICM clinic phone appointment on 08/06/2016.  Office appointment with Dr Lovena Le 07/05/2016.  Copy of ICM check sent to primary cardiologist and device physician.   ICM trend: 06/19/2016       Jacob Billings, RN 06/19/2016 3:26 PM

## 2016-06-20 LAB — BASIC METABOLIC PANEL
BUN: 42 mg/dL — AB (ref 4–21)
BUN: 45 mg/dL — AB (ref 4–21)
Creatinine: 1.3 mg/dL (ref 0.6–1.3)
Creatinine: 1.6 mg/dL — AB (ref 0.6–1.3)
Glucose: 106 mg/dL
Glucose: 111 mg/dL
Potassium: 5.2 mmol/L (ref 3.4–5.3)
Potassium: 5.6 mmol/L — AB (ref 3.4–5.3)
Sodium: 150 mmol/L — AB (ref 137–147)
Sodium: 151 mmol/L — AB (ref 137–147)

## 2016-06-24 ENCOUNTER — Encounter: Payer: Self-pay | Admitting: Adult Health

## 2016-06-24 ENCOUNTER — Non-Acute Institutional Stay (SKILLED_NURSING_FACILITY): Payer: Medicare Other | Admitting: Adult Health

## 2016-06-24 DIAGNOSIS — M545 Low back pain: Secondary | ICD-10-CM

## 2016-06-24 DIAGNOSIS — G8929 Other chronic pain: Secondary | ICD-10-CM

## 2016-06-24 DIAGNOSIS — E875 Hyperkalemia: Secondary | ICD-10-CM

## 2016-06-24 DIAGNOSIS — I5022 Chronic systolic (congestive) heart failure: Secondary | ICD-10-CM | POA: Diagnosis not present

## 2016-06-24 DIAGNOSIS — E870 Hyperosmolality and hypernatremia: Secondary | ICD-10-CM

## 2016-06-24 LAB — BASIC METABOLIC PANEL
BUN: 45 mg/dL — AB (ref 4–21)
CREATININE: 1.6 mg/dL — AB (ref 0.6–1.3)
Glucose: 106 mg/dL
POTASSIUM: 5.6 mmol/L — AB (ref 3.4–5.3)
SODIUM: 151 mmol/L — AB (ref 137–147)

## 2016-06-24 NOTE — Progress Notes (Signed)
Location:  [facility]upational psychologist of Service:  SNF (31) Provider:   Cindi Carbon, ANP [place] [phone]   REED, Jonelle Sidle, DO  Patient Care Team: Gayland Curry, DO as PCP - General (Geriatric Medicine) Jarome Matin, MD as Consulting Physician (Dermatology) Evans Lance, MD as Consulting Physician (Cardiology) [facility] Earnie Larsson, MD as Consulting Physician (Neurosurgery) Bjorn Loser, MD as Consulting Physician (Urology)  Extended Emergency Contact Information Primary Emergency Contact: Stowell,[name] Address: [address]          [place], [place] [address] [place] Phone: [phone] Relation: Spouse Secondary Emergency Contact: [place] Phone: [phone] Relation: Daughter  Code Status:  DNR Goals of care: Advanced Directive information Advanced Directives 06/10/2016  Does patient have an advance directive? Yes  Type of Advance Directive Out of facility DNR (pink MOST or yellow form);[place];Living will  Does patient want to make changes to advanced directive? -  Copy of advanced directive(s) in chart? Yes  Would patient like information on creating an advanced directive? -  Pre-existing out of facility DNR order (yellow form or pink MOST form) Yellow form placed in chart (order not valid for inpatient use);Pink MOST form placed in chart (order not valid for inpatient use)     Chief Complaint  Patient presents with  . Acute Visit    hypernatremia, hyperkalemia    HPI:  Pt is a 82 y.o. male seen today for an acute visit for hypernatremia and hyperkalemia. He has a hx of systolic CHF with an   EF of 25%. He has had difficulty with fluid balance issues secondary to poor intake and CHF. He has had a weight gain of 15 lbs over the past 8 days. He had a CXR on 10/20 for congestion and weight gain which showed stable CHF and small right  pleural effusion. His Aldactone dose was increased to 25 mg daily. Lasix was d/c'ed on 10/12. A BMP was rechecked today. Sodium increased to 151 from 150 on 10/19. Potassium increased to 5.6 from 5.1 on 10/19.  Staff reports that he has been less active and sleeping more throughout the day. He has decreased PO intake and has only voided twice today. He has some swelling and puffiness to his face and abdominal edema. He has no LE edema. He has chronic rapid shallow breaths. He was able to verbalize that he feels short of breath. His O2 sats were 95% on room air today.  He has been on scheduled and prn Ultram for back pain. He has used 4 prn Ultram in the past 14 days.   Past Medical History:  Diagnosis Date  . Basal cell carcinoma of skin of lower limb, including hip    . CAD (coronary artery disease)    s/p CABG  . Cardiomyopathy, ischemic    a. s/p STJ CRTD  . Cervicalgia    . Chronic systolic heart failure ([place])    a. Echo 4/16:  EF 25%, diff HK, Ao sclerosis without stenosis, mild AI, mild dilation of aortic root, mod MR, mod LAE, mildly reduced RVSF, PASP 52 mmHg  . Complete AV block ([place])   . Dyslipidemia   . Fracture of C2 vertebra, closed ([place])    s/p cervical fusion 05/2013  . Hypertension   . Impotence of organic origin    . Insomnia, unspecified    . Lumbago    . Memory loss  07/25/2014  . Osteoporosis    A/P Vertebral compression fx's  . Paroxysmal atrial fibrillation (HCC)     a. intol of coumadin - Rx with Aggrenox  . Prostate cancer ([place])    S/P radiation rx  . Squamous cell carcinoma of skin of lower limb, including hip    . TIA (transient ischemic attack)       . Unspecified vitamin D deficiency     Past Surgical History:  Procedure Laterality Date  . BACK SURGERY    . BiV-ICD  11/21/2010   [employer] ICD Model#CD3231-40 [phone]  . CARDIAC DEFIBRILLATOR PLACEMENT    . COLONOSCOPY  2006   polyps benign  . CORONARY ANGIOPLASTY    . CORONARY ARTERY  BYPASS GRAFT    . CYST EXCISION  10/2012   back Dr. Wilhemina Bonito  . CYST REMOVAL NECK  12/2012   basel cell (right) Dr. Sarajane Jews  . DEXA  April 2010  . HERNIA REPAIR    . KYPHOSIS SURGERY    . POSTERIOR CERVICAL FUSION/FORAMINOTOMY N/A 05/21/2013   Procedure: Cervical One, Cervical Two, Cervical Three Posterior cervical fusion with lateral mass fixation;  Surgeon: Charlie Pitter, MD;  Location: [place];  Service: Neurosurgery;  Laterality: N/A;  POSTERIOR CERVICAL FUSION/FORAMINOTOMY LEVEL 2    Allergies  Allergen Reactions  . Warfarin Sodium Other (See Comments)    REACTION: Stomach bleeding  . Amlodipine Besylate Swelling      Medication List       Accurate as of 06/24/16  4:05 PM. Always use your most recent med list.          BELSOMRA 10 MG Tabs Generic drug:  Suvorexant Take 10 mg by mouth at bedtime.   bisacodyl 5 MG EC tablet Commonly known as:  DULCOLAX Take 5 mg by mouth daily as needed for mild constipation or moderate constipation.   calcium carbonate 750 MG chewable tablet Commonly known as:  TUMS EX Chew 2 tablets by mouth 2 (two) times daily.   carvedilol 6.25 MG tablet Commonly known as:  COREG Take 6.25 mg by mouth 2 (two) times daily with a meal.   cholecalciferol 1000 units tablet Commonly known as:  VITAMIN D Take 2,000 Units by mouth daily.   ENTRESTO 24-26 MG Generic drug:  sacubitril-valsartan Take 1 tablet by mouth 2 (two) times daily.   feeding supplement Liqd Take 1 Container by mouth 2 (two) times daily between meals.   GUAIFENESIN PO Take 800 mg by mouth 2 (two) times daily.   hydrALAZINE 25 MG tablet Commonly known as:  APRESOLINE Take 25 mg by mouth daily.   isosorbide mononitrate 30 MG 24 hr tablet Commonly known as:  IMDUR Take 1 tablet (30 mg total) by mouth daily.   loratadine 10 MG tablet Commonly known as:  CLARITIN Take 10 mg by mouth daily as needed for allergies.   memantine 28 MG Cp24 24 hr capsule Commonly known as:   NAMENDA XR Take 28 mg by mouth daily.   mirtazapine 15 MG tablet Commonly known as:  REMERON Take 1 tablet (15 mg total) by mouth at bedtime.   pantoprazole 40 MG tablet Commonly known as:  PROTONIX Take 40 mg by mouth daily.   Rivaroxaban 15 MG Tabs tablet Commonly known as:  XARELTO Take 1 tablet (15 mg total) by mouth daily with supper.   simvastatin 20 MG tablet Commonly known as:  ZOCOR TAKE 1 TABLET BY MOUTH AT BEDTIME FOR CHOLESTEROL  traMADol 50 MG tablet Commonly known as:  ULTRAM Take 50 mg by mouth every 8 (eight) hours as needed.       Review of Systems  Constitutional: Positive for activity change, appetite change, fatigue and unexpected weight change. Negative for chills, diaphoresis and fever.  HENT: Positive for drooling and trouble swallowing. Negative for congestion, ear discharge, ear pain, rhinorrhea, sneezing and sore throat.   Respiratory: Positive for cough (chronic with clear/yellow sputum) and shortness of breath. Negative for wheezing and stridor.   Cardiovascular: Negative for chest pain, palpitations and leg swelling.  Gastrointestinal: Negative for abdominal pain, constipation and diarrhea.  Musculoskeletal: Positive for back pain and gait problem. Negative for arthralgias, joint swelling and myalgias.  Psychiatric/Behavioral: Positive for confusion. Negative for agitation and behavioral problems.    Immunization History  Administered Date(s) Administered  . Influenza Whole 06/02/2012, 06/01/2013  . Influenza-Unspecified 06/18/2014, 06/22/2015  . Pneumococcal Conjugate-13 07/26/2015  . Pneumococcal Polysaccharide-23 09/03/2007  . Td 09/03/2007   Pertinent  Health Maintenance Due  Topic Date Due  . INFLUENZA VACCINE  04/02/2016  . PNA vac Low Risk Adult  Completed   Fall Risk  08/10/2015 10/11/2013  Falls in the past year? Yes No  Number falls in past yr: 1 -  Injury with Fall? No -  Risk for fall due to : History of fall(s) -  Follow up  Falls evaluation completed -   Functional Status Survey:    Vitals:   06/24/16 1457  BP: (!) 142/86  Pulse: 75  Resp: 16  Temp: 98.2 F (36.8 C)  SpO2: 95%  Weight: 162 lb 6.4 oz (73.7 kg)   Body mass index is 20.85 kg/m.  Wt Readings from Last 3 Encounters:  06/24/16 162 lb 6.4 oz (73.7 kg)  06/10/16 147 lb (66.7 kg)  06/03/16 147 lb (66.7 kg)   Physical Exam  Constitutional: He appears well-developed and well-nourished. No distress.  HENT:  Head: Normocephalic and atraumatic.  Edematous in face. Excess oral secretions  Eyes: Right eye exhibits no discharge. Left eye exhibits no discharge.  Neck: No JVD present.  Cardiovascular: Normal rate and regular rhythm.   No murmur heard. Pulmonary/Chest: Effort normal. No respiratory distress. He has no wheezes. He has rales.   Has shallow breathing chronically. Mild crackles to L lower lobe  Abdominal: Soft. Bowel sounds are normal. There is no tenderness. There is no guarding.  He has edema to abdominal area  Musculoskeletal: He exhibits no edema or tenderness.  Lymphadenopathy:    He has no cervical adenopathy.  Neurological: He is alert. No cranial nerve deficit.  Oriented to self, able to follow commands.   Skin: Skin is warm and dry. No rash noted. He is not diaphoretic. No erythema.  Psychiatric: He has a normal mood and affect.  Nursing note and vitals reviewed.   Labs reviewed:  Recent Labs  12/06/15 0437  05/09/16 05/13/16 0600 06/24/16  NA 147*  < > 143 151* 151*  K 4.9  < > 4.7 4.9 5.6*  CL 110  --   --   --   --   CO2 24  --   --   --   --   GLUCOSE 98  --   --   --   --   BUN 49*  < > 58* 56* 45*  CREATININE 1.99*  < > 1.8* 1.6* 1.6*  CALCIUM 9.7  --   --   --   --   < > =  values in this interval not displayed.  Recent Labs  06/30/15  AST 18  ALT 19  ALKPHOS 64    Recent Labs  12/06/15 0437 12/21/15 05/09/16 05/13/16 0600  WBC 6.0 5.7 6.5 8.2  NEUTROABS 4.7  --   --   --   HGB 13.8 13.7  14.4 15.1  HCT 43.5 42 42 49  MCV 96.7  --   --   --   PLT 112* 150 191 180   Lab Results  Component Value Date   TSH 2.18 10/13/2015   Lab Results  Component Value Date   HGBA1C (H) 01/13/2010    5.7 (NOTE)                                                                       According to the ADA Clinical Practice Recommendations for 2011, when HbA1c is used as a screening test:   >=6.5%   Diagnostic of Diabetes Mellitus           (if abnormal result  is confirmed)  5.7-6.4%   Increased risk of developing Diabetes Mellitus  References:Diagnosis and Classification of Diabetes Mellitus,Diabetes [id number] 1):S62-S69 and Standards of Medical Care in         Diabetes - 2011,Diabetes Care,2011,34  (Suppl 1):S11-S61.   Lab Results  Component Value Date   CHOL 96 06/30/2015   HDL 35 06/30/2015   LDLCALC 49 06/30/2015   TRIG 61 06/30/2015   CHOLHDL 2.3 01/13/2010    Significant Diagnostic Results in last 30 days:  No results found.  Assessment/Plan  1. Hypernatremia  -Na today 151 - Start D5 W at 27ml/hr for one liter, then NSL to correct sodium and potassium imbalance -Hold Aldactone - Will have staff encourage water q 2 hrs while awake - Repeat BMP after D5W infusion completed  2. Hyperkalemia - K 5.6 today  -Will continue to monitor after fluid correction  3. Acute on chronic systolic heart failure (HCC) -in the setting of hypernatremia due to poor intake and diuretics -treating  -Lasix 40 mg IM x 1 dose -continue Entresto and Coreg -Strict I/Os -Daily weights - Foley placed due to low urine output  4. Chronic midline low back pain without sciatica -D/C scheduled Ultram due to lethargy, continue prn Ultram.  -Continue tylenol   Family/ staff Communication: discussed with staff/resident  Labs/tests ordered:  BMP

## 2016-06-25 ENCOUNTER — Encounter: Payer: Self-pay | Admitting: Internal Medicine

## 2016-06-25 ENCOUNTER — Non-Acute Institutional Stay (SKILLED_NURSING_FACILITY): Payer: Medicare Other | Admitting: Internal Medicine

## 2016-06-25 DIAGNOSIS — R627 Adult failure to thrive: Secondary | ICD-10-CM

## 2016-06-25 DIAGNOSIS — F01518 Vascular dementia, unspecified severity, with other behavioral disturbance: Secondary | ICD-10-CM

## 2016-06-25 DIAGNOSIS — N184 Chronic kidney disease, stage 4 (severe): Secondary | ICD-10-CM | POA: Diagnosis not present

## 2016-06-25 DIAGNOSIS — Z7189 Other specified counseling: Secondary | ICD-10-CM | POA: Diagnosis not present

## 2016-06-25 DIAGNOSIS — E870 Hyperosmolality and hypernatremia: Secondary | ICD-10-CM

## 2016-06-25 DIAGNOSIS — E875 Hyperkalemia: Secondary | ICD-10-CM | POA: Diagnosis not present

## 2016-06-25 DIAGNOSIS — F0151 Vascular dementia with behavioral disturbance: Secondary | ICD-10-CM

## 2016-06-25 DIAGNOSIS — I5023 Acute on chronic systolic (congestive) heart failure: Secondary | ICD-10-CM | POA: Diagnosis not present

## 2016-06-25 NOTE — Progress Notes (Signed)
Patient ID: Jacob Rosales, male   DOB: 1928/06/12, 82 y.o.   MRN: [id number]  Location:  [facility] Room Number: 107 Place of Service:  SNF (31) Provider:  Arelene Moroni L. Mariea Clonts, D.O., C.M.D.  Hollace Kinnier, DO  Patient Care Team: Gayland Curry, DO as PCP - General (Geriatric Medicine) Jarome Matin, MD as Consulting Physician (Dermatology) Evans Lance, MD as Consulting Physician (Cardiology) Well Lookingglass, MD as Consulting Physician (Neurosurgery) Bjorn Loser, MD as Consulting Physician (Urology)  Extended Emergency Contact Information Primary Emergency Contact: Koo,[name] Address: [address]          [place], [place] [address] [place] Phone: [phone] Relation: Spouse Secondary Emergency Contact: [place] Phone: [phone] Relation: Daughter  Code Status:  DNR Goals of care: Advanced Directive information Advanced Directives 06/25/2016  Does patient have an advance directive? Yes  Type of Advance Directive Out of facility DNR (pink MOST or yellow form);[place];Living will  Does patient want to make changes to advanced directive? -  Copy of advanced directive(s) in chart? Yes  Would patient like information on creating an advanced directive? -  Pre-existing out of facility DNR order (yellow form or pink MOST form) Yellow form placed in chart (order not valid for inpatient use);Pink MOST form placed in chart (order not valid for inpatient use)   Chief Complaint  Patient presents with  . Acute Visit    edema, puffy in face    HPI:  Pt is a 82 y.o. male seen today for an acute visit for edema, puffiness in face and increased lethargy.  Mr. Saley has a h/o prior stroke, vascular dementia and vascular parkinsonism as well as chronic systollic chf with biv pacemaker/defib and impedance monitor in place.  Nursing staff note that he  has been declining over the past several weeks, spending more time in bed, much sleepier and also has been having episodes of coughing and choking at meals.  NP and I have been regulating his chf and hypernatremia for the past several weeks with fluctuating volume status, renal function and sodium levels not responding typically to diuretics and fluids.  His impedance monitor constantly shows euvolemia even when his weights trend up and his face is very puffy and full.  Most recently, he had a dose of IM lasix 40mg  yesterday when he had the puffy face and weight up 14 lbs!  His sodium was 151.  He was also given D51/4 NS which was then changed to D5W.  Aldactone was held. He has had repeat labs with slight improvement in Na to 150, but renal function considerably worse at 1.9 creatinine.  Potassium normalized.  His scheduled ultram was stopped yesterday also due to lethargy and no recent complaints about back pain (in bed not chair).  He has a foley in place now, too, and had 2530 out last night.  Sats are only 91%.    When I saw him, he was lethargic, only said hello and did not answer other questions.  He was having apneic spells.  (This has happened to him in the past, his wife mentioned where he will breath fast for a few mins, then pause, but never resulted in any problems for him and the cause was not clear).    When I spoke with [name], she reported seeing him 2 days ago for Sunday brunch.  He was very sleepy and lethargic.  He was not able to feed himself and she had to feed him his fruit and he only took a few bites of his other food.  He did have his cranberry juice and magic cup when she fed him.  He as napping through the day.  His quality of life has been declining significantly over the past 2-3 wks.  She feels he was not getting much pleasure out of life.  His other family would visit and he was not able to communicate with them.  She also notes he was aware of his decline about a year ago when he  moved to skilled care, and he was quite discouraged.  He was not who he used to be and was not doing well.  I suggested hospice care to her and she said a friend of hers had also asked about it.  She was going to talk with Charlie's sister and other family to be sure they agreed with her that it was appropriate.  We did make some decisions about his care at this time (see a/p).       Past Medical History:  Diagnosis Date  . Basal cell carcinoma of skin of lower limb, including hip    . CAD (coronary artery disease)    s/p CABG  . Cardiomyopathy, ischemic    a. s/p STJ CRTD  . Cervicalgia    . Chronic systolic heart failure ([facility])    a. Echo 4/16:  EF 25%, diff HK, Ao sclerosis without stenosis, mild AI, mild dilation of aortic root, mod MR, mod LAE, mildly reduced RVSF, PASP 52 mmHg  . Complete AV block ([facility])   . Dyslipidemia   . Fracture of C2 vertebra, closed ([facility])    s/p cervical fusion 05/2013  . Hypertension   . Impotence of organic origin    . Insomnia, unspecified    . Lumbago    . Memory loss 07/25/2014  . Osteoporosis    A/P Vertebral compression fx's  . Paroxysmal atrial fibrillation (HCC)     a. intol of coumadin - Rx with Aggrenox  . Prostate cancer ([facility])    S/P radiation rx  . Squamous cell carcinoma of skin of lower limb, including hip    . TIA (transient ischemic attack)       . Unspecified vitamin D deficiency     Past Surgical History:  Procedure Laterality Date  . BACK SURGERY    . BiV-ICD  11/21/2010   [employer] ICD Model#CD3231-40 [phone]  . CARDIAC DEFIBRILLATOR PLACEMENT    . COLONOSCOPY  2006   polyps benign  . CORONARY ANGIOPLASTY    . CORONARY ARTERY BYPASS GRAFT    . CYST EXCISION  10/2012   back Dr. Wilhemina Bonito  . CYST REMOVAL NECK  12/2012   basel cell (right) Dr. Sarajane Jews  . DEXA  April 2010  . HERNIA REPAIR    . KYPHOSIS SURGERY    . POSTERIOR CERVICAL FUSION/FORAMINOTOMY N/A 05/21/2013   Procedure: Cervical One, Cervical Two,  Cervical Three Posterior cervical fusion with lateral mass fixation;  Surgeon: Charlie Pitter, MD;  Location: [facility];  Service: Neurosurgery;  Laterality: N/A;  POSTERIOR CERVICAL FUSION/FORAMINOTOMY LEVEL 2    Allergies  Allergen Reactions  . Warfarin Sodium Other (See Comments)    REACTION: Stomach bleeding  . Amlodipine Besylate Swelling      Medication List       Accurate as of 06/25/16 10:10 AM. Always use your most recent med  list.          acetaminophen 650 MG CR tablet Commonly known as:  TYLENOL Take 650 mg by mouth 3 (three) times daily.   BELSOMRA 10 MG Tabs Generic drug:  Suvorexant Take 10 mg by mouth at bedtime.   bisacodyl 5 MG EC tablet Commonly known as:  DULCOLAX Take 5 mg by mouth daily as needed for mild constipation or moderate constipation.   calcium carbonate 750 MG chewable tablet Commonly known as:  TUMS EX Chew 2 tablets by mouth 2 (two) times daily.   carvedilol 6.25 MG tablet Commonly known as:  COREG Take 6.25 mg by mouth 2 (two) times daily with a meal.   cholecalciferol 1000 units tablet Commonly known as:  VITAMIN D Take 2,000 Units by mouth daily.   DEEP SEA NASAL SPRAY 0.65 % nasal spray Generic drug:  sodium chloride Place 2 sprays into the nose 2 (two) times daily.   ENTRESTO 24-26 MG Generic drug:  sacubitril-valsartan Take 1 tablet by mouth 2 (two) times daily.   feeding supplement Liqd Take 1 Container by mouth 2 (two) times daily between meals.   GUAIFENESIN PO Take 800 mg by mouth 2 (two) times daily.   hydrALAZINE 25 MG tablet Commonly known as:  APRESOLINE Take 25 mg by mouth daily.   isosorbide mononitrate 30 MG 24 hr tablet Commonly known as:  IMDUR Take 1 tablet (30 mg total) by mouth daily.   loratadine 10 MG tablet Commonly known as:  CLARITIN Take 10 mg by mouth daily as needed for allergies.   memantine 28 MG Cp24 24 hr capsule Commonly known as:  NAMENDA XR Take 28 mg by mouth daily.   mirtazapine  15 MG tablet Commonly known as:  REMERON Take 1 tablet (15 mg total) by mouth at bedtime.   pantoprazole 40 MG tablet Commonly known as:  PROTONIX Take 40 mg by mouth daily.   Rivaroxaban 15 MG Tabs tablet Commonly known as:  XARELTO Take 1 tablet (15 mg total) by mouth daily with supper.   simvastatin 20 MG tablet Commonly known as:  ZOCOR TAKE 1 TABLET BY MOUTH AT BEDTIME FOR CHOLESTEROL   spironolactone 25 MG tablet Commonly known as:  ALDACTONE Take 12.5 mg by mouth daily.   traMADol 50 MG tablet Commonly known as:  ULTRAM Take 50 mg by mouth every 8 (eight) hours as needed.       Review of Systems  Unable to perform ROS: Dementia  see hpi from nursing staff  Immunization History  Administered Date(s) Administered  . Influenza Whole 06/02/2012, 06/01/2013  . Influenza-Unspecified 06/18/2014, 06/22/2015  . Pneumococcal Conjugate-13 07/26/2015  . Pneumococcal Polysaccharide-23 09/03/2007  . Td 09/03/2007   Pertinent  Health Maintenance Due  Topic Date Due  . INFLUENZA VACCINE  04/02/2016  . PNA vac Low Risk Adult  Completed   Fall Risk  08/10/2015 10/11/2013  Falls in the past year? Yes No  Number falls in past yr: 1 -  Injury with Fall? No -  Risk for fall due to : History of fall(s) -  Follow up Falls evaluation completed -   Functional Status Survey:    Vitals:   06/25/16 0938  BP: (!) 115/58  Pulse: 80  Resp: 18  Temp: 97.9 F (36.6 C)  TempSrc: Oral  SpO2: 91%  Weight: 159 lb (72.1 kg)   Body mass index is 20.41 kg/m. Physical Exam  Constitutional: No distress.  Lethargic male resting in bed  HENT:  Head: Atraumatic.  Cardiovascular: Normal rate and regular rhythm.   Pulmonary/Chest: He has no wheezes. He has no rales.  Having periods of tachypnea, then apnea  Abdominal: Soft. Bowel sounds are normal. He exhibits no distension and no mass. There is no tenderness. There is no rebound and no guarding.  Musculoskeletal: He exhibits no  tenderness.  Neurological:  Lethargic, minimally responsive to questions, but arousable and opens eyes  Skin: Skin is warm and dry. There is pallor.    Labs reviewed:  Recent Labs  12/06/15 0437  05/13/16 0600 06/20/16 0600 06/24/16  NA 147*  < > 151* 151*  150* 151*  K 4.9  < > 4.9 5.6*  5.2 5.6*  CL 110  --   --   --   --   CO2 24  --   --   --   --   GLUCOSE 98  --   --   --   --   BUN 49*  < > 56* 45*  42* 45*  CREATININE 1.99*  < > 1.6* 1.6*  1.3 1.6*  CALCIUM 9.7  --   --   --   --   < > = values in this interval not displayed.  Recent Labs  06/30/15  AST 18  ALT 19  ALKPHOS 64    Recent Labs  12/06/15 0437 12/21/15 05/09/16 05/13/16 0600  WBC 6.0 5.7 6.5 8.2  NEUTROABS 4.7  --   --   --   HGB 13.8 13.7 14.4 15.1  HCT 43.5 42 42 49  MCV 96.7  --   --   --   PLT 112* 150 191 180   Lab Results  Component Value Date   TSH 2.18 10/13/2015   Lab Results  Component Value Date   HGBA1C (H) 01/13/2010    5.7 (NOTE)                                                                       According to the ADA Clinical Practice Recommendations for 2011, when HbA1c is used as a screening test:   >=6.5%   Diagnostic of Diabetes Mellitus           (if abnormal result  is confirmed)  5.7-6.4%   Increased risk of developing Diabetes Mellitus  References:Diagnosis and Classification of Diabetes Mellitus,Diabetes [id number] 1):S62-S69 and Standards of Medical Care in         Diabetes - 2011,Diabetes Care,2011,34  (Suppl 1):S11-S61.   Lab Results  Component Value Date   CHOL 96 06/30/2015   HDL 35 06/30/2015   LDLCALC 49 06/30/2015   TRIG 61 06/30/2015   CHOLHDL 2.3 01/13/2010   10/24:  Na 150, K 4.7, BUN 43, cr 1.9, glucose 131  Assessment/Plan 1. Hypernatremia -only minimally improved due to need for diuretics also -intake poor, prognosis poor  2. Hyperkalemia -better with hydration and glucose in fluids  3. Acute on chronic systolic CHF  (congestive heart failure), NYHA class 3 (HCC) -weight not coming down despite diuresis with need for fluids for Na levels -his wife agrees that we are prolonging his life and the quality is no longer as he would want it  4. Vascular dementia with behavior disturbance -advancing  and unable to feed himself at times now  5. Chronic kidney disease (CKD), stage IV (severe) (HCC) -worse today after diuresis and fluids  6. Adult failure to thrive -suspect lethargy is due to advanced chf and hypernatremia primarily at this point, but also intake poor and functional status majorly declined  7. Advance care planning spent 28 mins speaking with Mrs. Scheuneman about goals of care and hospice care today--see hpi for details of conversation -changes:  D/c IV, IVF; comfort measures, encourage po hydration, do not hospitalize, d/c foley, d/c I/Os -also d/c unnecessary meds:  Mirtazapine, namenda XR, zocor, tums, D3, protonix, claritin, belsomra--will see if he wakes up more after this, but not sure he will at this point  Family/ staff Communication: spent 28 mins speaking with Mrs. Loup about goals of care and hospice care today--see hpi for details  Labs/tests ordered:  No new, see above

## 2016-06-26 ENCOUNTER — Telehealth: Payer: Self-pay

## 2016-06-26 NOTE — Telephone Encounter (Signed)
Call back to wife and explained that some hospice physicians can provide the order to turn the defibrillator off and then a copy of the order is sent to this office.  Advised if the hospice physician does not provide the order then order will be obtained from Dr Lovena Le. Once order is obtained then industry will come to the facility to make the changes.  Advised her to follow up with her hospice liaison and hospice will notify the office with the order.

## 2016-06-26 NOTE — Telephone Encounter (Signed)
Received call from wife stating patient will be going under the care of hospice due to his declining health.  She stated she wants the defibrillator turned off.  Advised would check with device RN to procedure to have it turned off.

## 2016-06-28 ENCOUNTER — Telehealth: Payer: Self-pay

## 2016-06-28 NOTE — Telephone Encounter (Signed)
Received call from wife.  Reviewed process again regarding patient entering hospice and turning off the defibrillator as previously requested.  Advised Hospice will notify the office and industry will be arranged to make the necessary adjustments to the device.  She wants to cancel the 11/3 appointment with Dr Lovena Le since patient has declined so much and he will be in hospice.  She will be speaking with hospice nurse today.

## 2016-07-03 DEATH — deceased

## 2016-07-05 ENCOUNTER — Encounter: Payer: Medicare Other | Admitting: Internal Medicine
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
